# Patient Record
Sex: Female | Born: 1980
Health system: Southern US, Community
[De-identification: ages and names within clinical notes are randomized; demographics above are authoritative.]

## PROBLEM LIST (undated history)

## (undated) DIAGNOSIS — I10 Essential (primary) hypertension: Secondary | ICD-10-CM

## (undated) DIAGNOSIS — J45909 Unspecified asthma, uncomplicated: Secondary | ICD-10-CM

## (undated) DIAGNOSIS — Z21 Asymptomatic human immunodeficiency virus [HIV] infection status: Secondary | ICD-10-CM

## (undated) DIAGNOSIS — E119 Type 2 diabetes mellitus without complications: Secondary | ICD-10-CM

## (undated) DIAGNOSIS — I2510 Atherosclerotic heart disease of native coronary artery without angina pectoris: Secondary | ICD-10-CM

## (undated) DIAGNOSIS — J449 Chronic obstructive pulmonary disease, unspecified: Secondary | ICD-10-CM

## (undated) DIAGNOSIS — I509 Heart failure, unspecified: Secondary | ICD-10-CM

## (undated) DIAGNOSIS — J439 Emphysema, unspecified: Secondary | ICD-10-CM

## (undated) DIAGNOSIS — L0291 Cutaneous abscess, unspecified: Secondary | ICD-10-CM

## (undated) DIAGNOSIS — B20 Human immunodeficiency virus [HIV] disease: Secondary | ICD-10-CM

## (undated) DIAGNOSIS — J189 Pneumonia, unspecified organism: Secondary | ICD-10-CM

## (undated) DIAGNOSIS — I219 Acute myocardial infarction, unspecified: Secondary | ICD-10-CM

## (undated) HISTORY — PX: FOOT SURGERY: SHX648

## (undated) HISTORY — DX: Heart failure, unspecified: I50.9

## (undated) HISTORY — DX: Essential (primary) hypertension: I10

## (undated) HISTORY — PX: TUBAL LIGATION: SHX77

## (undated) HISTORY — DX: Atherosclerotic heart disease of native coronary artery without angina pectoris: I25.10

## (undated) HISTORY — PX: CHOLECYSTECTOMY: SHX55

---
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
## 2010-01-17 ENCOUNTER — Emergency Department (HOSPITAL_COMMUNITY): Admission: EM | Admit: 2010-01-17 | Discharge: 2010-01-17 | Payer: Self-pay | Admitting: Emergency Medicine

## 2010-07-15 ENCOUNTER — Emergency Department (HOSPITAL_COMMUNITY): Admission: AC | Admit: 2010-07-15 | Discharge: 2010-07-15 | Payer: Self-pay | Admitting: Emergency Medicine

## 2011-02-16 LAB — CBC
HCT: 37.7 % (ref 36.0–46.0)
Hemoglobin: 13.1 g/dL (ref 12.0–15.0)
Platelets: 139 10*3/uL — ABNORMAL LOW (ref 150–400)
RBC: 4.18 MIL/uL (ref 3.87–5.11)
RDW: 13.1 % (ref 11.5–15.5)
WBC: 5.4 10*3/uL (ref 4.0–10.5)

## 2011-02-16 LAB — DIFFERENTIAL
Basophils Relative: 0 % (ref 0–1)
Eosinophils Relative: 1 % (ref 0–5)
Monocytes Absolute: 0.4 10*3/uL (ref 0.1–1.0)
Monocytes Relative: 8 % (ref 3–12)
Neutro Abs: 2.8 10*3/uL (ref 1.7–7.7)

## 2011-02-16 LAB — POCT CARDIAC MARKERS
CKMB, poc: <1 ng/mL — ABNORMAL LOW (ref 1.0–8.0)
Troponin i, poc: <0.05 ng/mL (ref 0.00–0.09)

## 2011-04-17 ENCOUNTER — Emergency Department (HOSPITAL_COMMUNITY)
Admission: EM | Admit: 2011-04-17 | Discharge: 2011-04-17 | Disposition: A | Discharge status: Home / Self Care | Payer: Self-pay | Attending: Emergency Medicine | Admitting: Emergency Medicine

## 2011-04-17 DIAGNOSIS — J45909 Unspecified asthma, uncomplicated: Secondary | ICD-10-CM | POA: Insufficient documentation

## 2011-04-17 DIAGNOSIS — Z21 Asymptomatic human immunodeficiency virus [HIV] infection status: Secondary | ICD-10-CM | POA: Insufficient documentation

## 2011-04-17 DIAGNOSIS — N61 Mastitis without abscess: Secondary | ICD-10-CM | POA: Insufficient documentation

## 2011-04-29 ENCOUNTER — Emergency Department (HOSPITAL_COMMUNITY)
Admission: EM | Admit: 2011-04-29 | Discharge: 2011-04-29 | Discharge status: Against Medical Advice | Payer: PRIVATE HEALTH INSURANCE | Attending: Emergency Medicine | Admitting: Emergency Medicine

## 2011-04-29 ENCOUNTER — Emergency Department (HOSPITAL_COMMUNITY)
Admission: EM | Admit: 2011-04-29 | Discharge: 2011-04-30 | Disposition: A | Discharge status: Home / Self Care | Payer: PRIVATE HEALTH INSURANCE | Attending: Emergency Medicine | Admitting: Emergency Medicine

## 2011-04-29 DIAGNOSIS — N739 Female pelvic inflammatory disease, unspecified: Secondary | ICD-10-CM | POA: Insufficient documentation

## 2011-04-29 DIAGNOSIS — K644 Residual hemorrhoidal skin tags: Secondary | ICD-10-CM | POA: Insufficient documentation

## 2011-04-29 DIAGNOSIS — R109 Unspecified abdominal pain: Secondary | ICD-10-CM | POA: Insufficient documentation

## 2011-04-29 DIAGNOSIS — E119 Type 2 diabetes mellitus without complications: Secondary | ICD-10-CM | POA: Insufficient documentation

## 2011-04-29 DIAGNOSIS — Z21 Asymptomatic human immunodeficiency virus [HIV] infection status: Secondary | ICD-10-CM | POA: Insufficient documentation

## 2011-04-29 DIAGNOSIS — N898 Other specified noninflammatory disorders of vagina: Secondary | ICD-10-CM | POA: Insufficient documentation

## 2011-04-29 DIAGNOSIS — K645 Perianal venous thrombosis: Secondary | ICD-10-CM | POA: Insufficient documentation

## 2011-04-30 ENCOUNTER — Emergency Department (HOSPITAL_COMMUNITY): Payer: PRIVATE HEALTH INSURANCE

## 2011-04-30 LAB — URINALYSIS, ROUTINE W REFLEX MICROSCOPIC
Bilirubin Urine: NEGATIVE
Nitrite: NEGATIVE
Urobilinogen, UA: 0.2 mg/dL (ref 0.0–1.0)

## 2011-04-30 LAB — WET PREP, GENITAL
Trich, Wet Prep: NONE SEEN
Yeast Wet Prep HPF POC: NONE SEEN

## 2011-04-30 LAB — URINE MICROSCOPIC-ADD ON

## 2011-05-01 LAB — URINE CULTURE
Colony Count: NO GROWTH
Culture  Setup Time: 201206041408
Culture: NO GROWTH

## 2012-07-23 DIAGNOSIS — F39 Unspecified mood [affective] disorder: Secondary | ICD-10-CM | POA: Insufficient documentation

## 2014-06-18 DIAGNOSIS — K219 Gastro-esophageal reflux disease without esophagitis: Secondary | ICD-10-CM | POA: Insufficient documentation

## 2014-06-18 DIAGNOSIS — G629 Polyneuropathy, unspecified: Secondary | ICD-10-CM | POA: Insufficient documentation

## 2016-02-07 ENCOUNTER — Emergency Department (HOSPITAL_BASED_OUTPATIENT_CLINIC_OR_DEPARTMENT_OTHER)
Admission: EM | Admit: 2016-02-07 | Discharge: 2016-02-07 | Disposition: A | Discharge status: Home / Self Care | Payer: Commercial Managed Care - PPO | Attending: Emergency Medicine | Admitting: Emergency Medicine

## 2016-02-07 ENCOUNTER — Encounter (HOSPITAL_BASED_OUTPATIENT_CLINIC_OR_DEPARTMENT_OTHER): Payer: Self-pay | Admitting: *Deleted

## 2016-02-07 DIAGNOSIS — R69 Illness, unspecified: Secondary | ICD-10-CM

## 2016-02-07 DIAGNOSIS — J449 Chronic obstructive pulmonary disease, unspecified: Secondary | ICD-10-CM | POA: Insufficient documentation

## 2016-02-07 DIAGNOSIS — H66002 Acute suppurative otitis media without spontaneous rupture of ear drum, left ear: Secondary | ICD-10-CM | POA: Insufficient documentation

## 2016-02-07 DIAGNOSIS — Z794 Long term (current) use of insulin: Secondary | ICD-10-CM | POA: Diagnosis not present

## 2016-02-07 DIAGNOSIS — E109 Type 1 diabetes mellitus without complications: Secondary | ICD-10-CM | POA: Insufficient documentation

## 2016-02-07 DIAGNOSIS — R05 Cough: Secondary | ICD-10-CM | POA: Diagnosis present

## 2016-02-07 DIAGNOSIS — J111 Influenza due to unidentified influenza virus with other respiratory manifestations: Secondary | ICD-10-CM | POA: Insufficient documentation

## 2016-02-07 DIAGNOSIS — F17200 Nicotine dependence, unspecified, uncomplicated: Secondary | ICD-10-CM | POA: Insufficient documentation

## 2016-02-07 DIAGNOSIS — Z79899 Other long term (current) drug therapy: Secondary | ICD-10-CM | POA: Diagnosis not present

## 2016-02-07 HISTORY — DX: Type 2 diabetes mellitus without complications: E11.9

## 2016-02-07 HISTORY — DX: Chronic obstructive pulmonary disease, unspecified: J44.9

## 2016-02-07 MED ORDER — AZITHROMYCIN 250 MG PO TABS: ORAL_TABLET | ORAL | Status: DC

## 2016-02-07 MED FILL — AZITHROMYCIN 250 MG TABLET: 250 | 5 days supply | Qty: 6 | Fill #0

## 2016-02-07 NOTE — ED Notes (Signed)
Directed to pharmacy to pick up prescriptions 

## 2016-02-07 NOTE — ED Provider Notes (Signed)
CSN: [id number]     Arrival date & time 02/07/16  1017 History   First MD Initiated Contact with Patient 02/07/16 1033     Chief Complaint  Patient presents with  . Cough     (Consider location/radiation/quality/duration/timing/severity/associated sxs/prior Treatment) HPI Comments: Patient is a 35 year old female with history of type 1 diabetes. She presents for evaluation of fever, body aches, cough, congestion for the past several days. She started yesterday experiencing left ear pain and muffled hearing. She has a history of otitis media and states this feels the same.  Patient is a 35 y.o. female presenting with cough. The history is provided by the patient.  Cough Cough characteristics:  Non-productive Severity:  Moderate Onset quality:  Sudden Duration:  4 days Timing:  Constant Progression:  Worsening Chronicity:  New Smoker: yes   Relieved by:  Nothing Worsened by:  Nothing tried Ineffective treatments:  None tried Associated symptoms: fever   Associated symptoms: no chills     Past Medical History  Diagnosis Date  . Diabetes mellitus without complication ([place])   . COPD (chronic obstructive pulmonary disease) ([place])    History reviewed. No pertinent past surgical history. History reviewed. No pertinent family history. Social History  Substance Use Topics  . Smoking status: Current Some Day Smoker  . Smokeless tobacco: None  . Alcohol Use: None   OB History    No data available     Review of Systems  Constitutional: Positive for fever. Negative for chills.  Respiratory: Positive for cough.   All other systems reviewed and are negative.     Allergies  Wellbutrin  Home Medications   Prior to Admission medications   Medication Sig Start Date End Date Taking? Authorizing Provider  albuterol (PROVENTIL HFA;VENTOLIN HFA) 108 (90 Base) MCG/ACT inhaler Inhale into the lungs every 6 (six) hours as needed for wheezing or shortness of breath.   Yes Historical  Provider, MD  clonazePAM (KLONOPIN) 0.5 MG tablet Take 0.5 mg by mouth 2 (two) times daily.   Yes Historical Provider, MD  insulin glargine (LANTUS) 100 UNIT/ML injection Inject 35 Units into the skin daily.   Yes Historical Provider, MD  omeprazole (PRILOSEC) 20 MG capsule Take 20 mg by mouth daily.   Yes Historical Provider, MD   BP 122/87 mmHg  Pulse 96  Temp(Src) 98.1 F (36.7 C) (Oral)  Resp 16  Ht 5\' 7"  (1.702 m)  Wt 200 lb (90.719 kg)  BMI 31.32 kg/m2  SpO2 97%  LMP 01/31/2016 Physical Exam  Constitutional: She is oriented to person, place, and time. She appears well-developed and well-nourished. No distress.  HENT:  Head: Normocephalic and atraumatic.  Mouth/Throat: Oropharynx is clear and moist.  The right TM is clear.  The left TM is erythematous and bulging.  Neck: Normal range of motion. Neck supple.  Cardiovascular: Normal rate and regular rhythm.  Exam reveals no gallop and no friction rub.   No murmur heard. Pulmonary/Chest: Effort normal and breath sounds normal. No respiratory distress. She has no wheezes. She has no rales.  Abdominal: Soft. Bowel sounds are normal. She exhibits no distension. There is no tenderness.  Musculoskeletal: Normal range of motion.  Neurological: She is alert and oriented to person, place, and time.  Skin: Skin is warm and dry. She is not diaphoretic.  Nursing note and vitals reviewed.   ED Course  Procedures (including critical care time) Labs Review Labs Reviewed - No data to display  Imaging Review No results found. I  have personally reviewed and evaluated these images and lab results as part of my medical decision-making.   EKG Interpretation None      MDM   Final diagnoses:  None    The underlying URI is likely viral in nature, most likely influenza. However she does have signs and symptoms of an acute bacterial otitis media. This will be treated with Zithromax and when necessary return.    Veryl Speak,  MD 02/07/16 1043

## 2016-02-07 NOTE — Discharge Instructions (Signed)
Zithromax as prescribed.  Drink plenty of fluids and get plenty of rest.  Return to the emergency department if you experience chest pain or difficulty breathing.   Otitis Media With Effusion Otitis media with effusion is the presence of fluid in the middle ear. This is a common problem in children, which often follows ear infections. It may be present for weeks or longer after the infection. Unlike an acute ear infection, otitis media with effusion refers only to fluid behind the ear drum and not infection. Children with repeated ear and sinus infections and allergy problems are the most likely to get otitis media with effusion. CAUSES  The most frequent cause of the fluid buildup is dysfunction of the eustachian tubes. These are the tubes that drain fluid in the ears to the back of the nose (nasopharynx). SYMPTOMS   The main symptom of this condition is hearing loss. As a result, you or your child may:  Listen to the TV at a loud volume.  Not respond to questions.  Ask "what" often when spoken to.  Mistake or confuse one sound or word for another.  There may be a sensation of fullness or pressure but usually not pain. DIAGNOSIS   Your health care provider will diagnose this condition by examining you or your child's ears.  Your health care provider may test the pressure in you or your child's ear with a tympanometer.  A hearing test may be conducted if the problem persists. TREATMENT   Treatment depends on the duration and the effects of the effusion.  Antibiotics, decongestants, nose drops, and cortisone-type drugs (tablets or nasal spray) may not be helpful.  Children with persistent ear effusions may have delayed language or behavioral problems. Children at risk for developmental delays in hearing, learning, and speech may require referral to a specialist earlier than children not at risk.  You or your child's health care provider may suggest a referral to an ear, nose,  and throat surgeon for treatment. The following may help restore normal hearing:  Drainage of fluid.  Placement of ear tubes (tympanostomy tubes).  Removal of adenoids (adenoidectomy). HOME CARE INSTRUCTIONS   Avoid secondhand smoke.  Infants who are breastfed are less likely to have this condition.  Avoid feeding infants while they are lying flat.  Avoid known environmental allergens.  Avoid people who are sick. SEEK MEDICAL CARE IF:   Hearing is not better in 3 months.  Hearing is worse.  Ear pain.  Drainage from the ear.  Dizziness. MAKE SURE YOU:   Understand these instructions.  Will watch your condition.  Will get help right away if you are not doing well or get worse.   This information is not intended to replace advice given to you by your health care provider. Make sure you discuss any questions you have with your health care provider.   Document Released: 12/20/2004 Document Revised: 12/03/2014 Document Reviewed: 06/09/2013 Elsevier Interactive Patient Education 2016 Elsevier Inc.  Influenza, Adult Influenza ("the flu") is a viral infection of the respiratory tract. It occurs more often in winter months because people spend more time in close contact with one another. Influenza can make you feel very sick. Influenza easily spreads from person to person (contagious). CAUSES  Influenza is caused by a virus that infects the respiratory tract. You can catch the virus by breathing in droplets from an infected person's cough or sneeze. You can also catch the virus by touching something that was recently contaminated with the  virus and then touching your mouth, nose, or eyes. RISKS AND COMPLICATIONS You may be at risk for a more severe case of influenza if you smoke cigarettes, have diabetes, have chronic heart disease (such as heart failure) or lung disease (such as asthma), or if you have a weakened immune system. Elderly people and pregnant women are also at risk  for more serious infections. The most common problem of influenza is a lung infection (pneumonia). Sometimes, this problem can require emergency medical care and may be life threatening. SIGNS AND SYMPTOMS  Symptoms typically last 4 to 10 days and may include:  Fever.  Chills.  Headache, body aches, and muscle aches.  Sore throat.  Chest discomfort and cough.  Poor appetite.  Weakness or feeling tired.  Dizziness.  Nausea or vomiting. DIAGNOSIS  Diagnosis of influenza is often made based on your history and a physical exam. A nose or throat swab test can be done to confirm the diagnosis. TREATMENT  In mild cases, influenza goes away on its own. Treatment is directed at relieving symptoms. For more severe cases, your health care provider may prescribe antiviral medicines to shorten the sickness. Antibiotic medicines are not effective because the infection is caused by a virus, not by bacteria. HOME CARE INSTRUCTIONS  Take medicines only as directed by your health care provider.  Use a cool mist humidifier to make breathing easier.  Get plenty of rest until your temperature returns to normal. This usually takes 3 to 4 days.  Drink enough fluid to keep your urine clear or pale yellow.  Cover yourmouth and nosewhen coughing or sneezing,and wash your handswellto prevent thevirusfrom spreading.  Stay homefromwork orschool untilthe fever is gonefor at least 40full day. PREVENTION  An annual influenza vaccination (flu shot) is the best way to avoid getting influenza. An annual flu shot is now routinely recommended for all adults in the [place] IF:  You experiencechest pain, yourcough worsens,or you producemore mucus.  Youhave nausea,vomiting, ordiarrhea.  Your fever returns or gets worse. SEEK IMMEDIATE MEDICAL CARE IF:  You havetrouble breathing, you become short of breath,or your skin ornails becomebluish.  You have severe painor  stiffnessin the neck.  You develop a sudden headache, or pain in the face or ear.  You have nausea or vomiting that you cannot control. MAKE SURE YOU:   Understand these instructions.  Will watch your condition.  Will get help right away if you are not doing well or get worse.   This information is not intended to replace advice given to you by your health care provider. Make sure you discuss any questions you have with your health care provider.   Document Released: 11/09/2000 Document Revised: 12/03/2014 Document Reviewed: 02/11/2012 Elsevier Interactive Patient Education [employer].

## 2016-02-07 NOTE — ED Notes (Signed)
Pt amb to room 6 with quick steady gait in nad. Pt reports cough and congestion x Sunday, with diarrhea and vomiting yesterday, none today.

## 2016-02-09 ENCOUNTER — Emergency Department (HOSPITAL_BASED_OUTPATIENT_CLINIC_OR_DEPARTMENT_OTHER): Payer: Commercial Managed Care - PPO

## 2016-02-09 ENCOUNTER — Encounter (HOSPITAL_BASED_OUTPATIENT_CLINIC_OR_DEPARTMENT_OTHER): Payer: Self-pay | Admitting: Emergency Medicine

## 2016-02-09 ENCOUNTER — Emergency Department (HOSPITAL_BASED_OUTPATIENT_CLINIC_OR_DEPARTMENT_OTHER)
Admission: EM | Admit: 2016-02-09 | Discharge: 2016-02-09 | Disposition: A | Discharge status: Home / Self Care | Payer: Commercial Managed Care - PPO | Attending: Emergency Medicine | Admitting: Emergency Medicine

## 2016-02-09 DIAGNOSIS — Z79899 Other long term (current) drug therapy: Secondary | ICD-10-CM | POA: Insufficient documentation

## 2016-02-09 DIAGNOSIS — H6692 Otitis media, unspecified, left ear: Secondary | ICD-10-CM | POA: Diagnosis not present

## 2016-02-09 DIAGNOSIS — E119 Type 2 diabetes mellitus without complications: Secondary | ICD-10-CM | POA: Diagnosis not present

## 2016-02-09 DIAGNOSIS — Z791 Long term (current) use of non-steroidal anti-inflammatories (NSAID): Secondary | ICD-10-CM | POA: Diagnosis not present

## 2016-02-09 DIAGNOSIS — J449 Chronic obstructive pulmonary disease, unspecified: Secondary | ICD-10-CM | POA: Insufficient documentation

## 2016-02-09 DIAGNOSIS — F17210 Nicotine dependence, cigarettes, uncomplicated: Secondary | ICD-10-CM | POA: Diagnosis not present

## 2016-02-09 DIAGNOSIS — H9202 Otalgia, left ear: Secondary | ICD-10-CM | POA: Diagnosis present

## 2016-02-09 DIAGNOSIS — J069 Acute upper respiratory infection, unspecified: Secondary | ICD-10-CM

## 2016-02-09 DIAGNOSIS — Z794 Long term (current) use of insulin: Secondary | ICD-10-CM | POA: Insufficient documentation

## 2016-02-09 LAB — RAPID STREP SCREEN (MED CTR MEBANE ONLY): STREPTOCOCCUS, GROUP A SCREEN (DIRECT): NEGATIVE

## 2016-02-09 MED ORDER — AMOXICILLIN-POT CLAVULANATE 875-125 MG PO TABS: 1.0000 | ORAL_TABLET | Freq: Two times a day (BID) | ORAL | Status: DC

## 2016-02-09 MED FILL — AMOX-CLAV 875-125 MG TABLET: 875-125 | 10 days supply | Qty: 20 | Fill #0

## 2016-02-09 NOTE — Discharge Instructions (Signed)
1. Medications: augmentin, usual home medications 2. Treatment: rest, drink plenty of fluids  3. Follow Up: please followup with your primary doctor for discussion of your diagnoses and further evaluation after today's visit; if you do not have a primary care doctor use the phone number listed in your discharge paperwork to find one; please return to the ER for increased pain, severe chest pain or shortness of breath, new or worsening symptoms   Upper Respiratory Infection, Adult Most upper respiratory infections (URIs) are caused by a virus. A URI affects the nose, throat, and upper air passages. The most common type of URI is often called "the common cold." HOME CARE   Take medicines only as told by your doctor.  Gargle warm saltwater or take cough drops to comfort your throat as told by your doctor.  Use a warm mist humidifier or inhale steam from a shower to increase air moisture. This may make it easier to breathe.  Drink enough fluid to keep your pee (urine) clear or pale yellow.  Eat soups and other clear broths.  Have a healthy diet.  Rest as needed.  Go back to work when your fever is gone or your doctor says it is okay.  You may need to stay home longer to avoid giving your URI to others.  You can also wear a face mask and wash your hands often to prevent spread of the virus.  Use your inhaler more if you have asthma.  Do not use any tobacco products, including cigarettes, chewing tobacco, or electronic cigarettes. If you need help quitting, ask your doctor. GET HELP IF:  You are getting worse, not better.  Your symptoms are not helped by medicine.  You have chills.  You are getting more short of breath.  You have brown or red mucus.  You have yellow or brown discharge from your nose.  You have pain in your face, especially when you bend forward.  You have a fever.  You have puffy (swollen) neck glands.  You have pain while swallowing.  You have white  areas in the back of your throat. GET HELP RIGHT AWAY IF:   You have very bad or constant:  Headache.  Ear pain.  Pain in your forehead, behind your eyes, and over your cheekbones (sinus pain).  Chest pain.  You have long-lasting (chronic) lung disease and any of the following:  Wheezing.  Long-lasting cough.  Coughing up blood.  A change in your usual mucus.  You have a stiff neck.  You have changes in your:  Vision.  Hearing.  Thinking.  Mood. MAKE SURE YOU:   Understand these instructions.  Will watch your condition.  Will get help right away if you are not doing well or get worse.   This information is not intended to replace advice given to you by your health care provider. Make sure you discuss any questions you have with your health care provider.   Document Released: 04/30/2008 Document Revised: 03/29/2015 Document Reviewed: 02/17/2014 Elsevier Interactive Patient Education 2016 Elsevier Inc.  Otitis Media, Adult Otitis media is redness, soreness, and puffiness (swelling) in the space just behind your eardrum (middle ear). It may be caused by allergies or infection. It often happens along with a cold. HOME CARE  Take your medicine as told. Finish it even if you start to feel better.  Only take over-the-counter or prescription medicines for pain, discomfort, or fever as told by your doctor.  Follow up with your doctor  as told. GET HELP IF:  You have otitis media only in one ear, or bleeding from your nose, or both.  You notice a lump on your neck.  You are not getting better in 3-5 days.  You feel worse instead of better. GET HELP RIGHT AWAY IF:   You have pain that is not helped with medicine.  You have puffiness, redness, or pain around your ear.  You get a stiff neck.  You cannot move part of your face (paralysis).  You notice that the bone behind your ear hurts when you touch it. MAKE SURE YOU:   Understand these  instructions.  Will watch your condition.  Will get help right away if you are not doing well or get worse.   This information is not intended to replace advice given to you by your health care provider. Make sure you discuss any questions you have with your health care provider.   Document Released: 04/30/2008 Document Revised: 12/03/2014 Document Reviewed: 06/09/2013 Elsevier Interactive Patient Education [employer].

## 2016-02-09 NOTE — ED Provider Notes (Signed)
CSN: [id number]     Arrival date & time 02/09/16  1516 History   First MD Initiated Contact with Patient 02/09/16 1539     Chief Complaint  Patient presents with  . Otalgia    HPI   Renee Bryant is a 35 y.o. female with a PMH of DM, COPD who presents to the ED with subjective fever, chills, nasal congestion, sore throat, productive cough, and left ear pain. She states her symptoms started Saturday and have worsened since that time. Per record review, patient was evaluated in the ED 2 days ago and was discharged with azithromycin for her symptoms. She reports no improvement. She denies exacerbating factors. She reports chest discomfort with cough. She denies abdominal pain, N/V/D.   Past Medical History  Diagnosis Date  . Diabetes mellitus without complication ([place])   . COPD (chronic obstructive pulmonary disease) ([place])    History reviewed. No pertinent past surgical history. No family history on file. Social History  Substance Use Topics  . Smoking status: Current Some Day Smoker -- 1.00 packs/day    Types: Cigarettes  . Smokeless tobacco: None  . Alcohol Use: No   OB History    No data available      Review of Systems  Constitutional: Positive for fever and chills.  HENT: Positive for congestion, ear pain and sore throat.   Gastrointestinal: Negative for nausea, vomiting and diarrhea.  All other systems reviewed and are negative.     Allergies  Wellbutrin  Home Medications   Prior to Admission medications   Medication Sig Start Date End Date Taking? Authorizing Provider  azithromycin (ZITHROMAX Z-PAK) 250 MG tablet 2 po day one, then 1 daily x 4 days 02/07/16  Yes Veryl Speak, MD  albuterol (PROVENTIL HFA;VENTOLIN HFA) 108 (90 Base) MCG/ACT inhaler Inhale into the lungs every 6 (six) hours as needed for wheezing or shortness of breath.    Historical Provider, MD  amoxicillin-clavulanate (AUGMENTIN) 875-125 MG tablet Take 1 tablet by mouth every 12 (twelve) hours.  02/09/16   Marella Chimes, PA-C  clonazePAM (KLONOPIN) 0.5 MG tablet Take 0.5 mg by mouth 2 (two) times daily.    Historical Provider, MD  insulin glargine (LANTUS) 100 UNIT/ML injection Inject 35 Units into the skin daily.    Historical Provider, MD  omeprazole (PRILOSEC) 20 MG capsule Take 20 mg by mouth daily.    Historical Provider, MD    BP 146/95 mmHg  Pulse 93  Temp(Src) 98.3 F (36.8 C) (Oral)  Resp 18  Ht 5\' 7"  (1.702 m)  Wt 90.719 kg  BMI 31.32 kg/m2  SpO2 98%  LMP 01/31/2016 Physical Exam  Constitutional: She is oriented to person, place, and time. She appears well-developed and well-nourished. No distress.  HENT:  Head: Normocephalic and atraumatic.  Right Ear: Hearing, tympanic membrane, external ear and ear canal normal.  Left Ear: Hearing, external ear and ear canal normal. Tympanic membrane is erythematous.  Nose: Nose normal.  Mouth/Throat: Uvula is midline, oropharynx is clear and moist and mucous membranes are normal. No oropharyngeal exudate, posterior oropharyngeal edema, posterior oropharyngeal erythema or tonsillar abscesses.  Erythema to left TM. Mild tonsillar hypertrophy bilaterally.  Eyes: Conjunctivae, EOM and lids are normal. Pupils are equal, round, and reactive to light. Right eye exhibits no discharge. Left eye exhibits no discharge. No scleral icterus.  Neck: Normal range of motion. Neck supple.  Cardiovascular: Normal rate, regular rhythm, normal heart sounds, intact distal pulses and normal pulses.   Pulmonary/Chest: Effort  normal and breath sounds normal. No respiratory distress. She has no wheezes. She has no rales.  Abdominal: Soft. Normal appearance and bowel sounds are normal. She exhibits no distension and no mass. There is no tenderness. There is no rigidity, no rebound and no guarding.  Musculoskeletal: Normal range of motion. She exhibits no edema or tenderness.  Neurological: She is alert and oriented to person, place, and time.  Skin:  Skin is warm, dry and intact. No rash noted. She is not diaphoretic. No erythema. No pallor.  Psychiatric: She has a normal mood and affect. Her speech is normal and behavior is normal.  Nursing note and vitals reviewed.   ED Course  Procedures (including critical care time)  Labs Review Labs Reviewed  RAPID STREP SCREEN (NOT AT [facility])  CULTURE, GROUP A STREP [facility])    Imaging Review Dg Chest 2 View  02/09/2016  CLINICAL DATA:  Cough and fever 4 days EXAM: CHEST  2 VIEW COMPARISON:  Chest radiograph January 03, 2013; chest CT December 09, 2015 FINDINGS: There is no edema or consolidation. Heart size and pulmonary vascularity are normal. No adenopathy. No bone lesions. IMPRESSION: No edema or consolidation. Note that the nodular opacities seen on CT 2 months prior are not appreciable by radiography. Electronically Signed   By: Lowella Grip III M.D.   On: 02/09/2016 16:12   I have personally reviewed and evaluated these images and lab results as part of my medical decision-making.   EKG Interpretation None      MDM   Final diagnoses:  URI (upper respiratory infection)  Acute left otitis media, recurrence not specified, unspecified otitis media type    35 year old female presents with URI symptoms. Notes persistent left ear pain and cough, which she states she feels has worsened since being started on azithromycin. Patient is afebrile. Vital signs stable. No tachypnea, tachycardia, or hypoxia. Mild erythema to left TM. Mild tonsillar hypertrophy bilaterally. Lungs clear to auscultation bilaterally. Will obtain rapid strep and chest x-ray.   Rapid strep negative. Chest x-ray negative for edema or consolidation. Patient is nontoxic and well-appearing, feel she is stable for discharge at this time. Will change antibiotic to augmentin to cover for otitis media. Patient to follow up with PCP. Return precautions discussed. Patient verbalizes her understanding and is in agreement with  plan.  BP 146/95 mmHg  Pulse 93  Temp(Src) 98.3 F (36.8 C) (Oral)  Resp 18  Ht 5\' 7"  (1.702 m)  Wt 90.719 kg  BMI 31.32 kg/m2  SpO2 98%  LMP 01/31/2016     Marella Chimes, PA-C 02/09/16 [place], MD 02/09/16 [phone]

## 2016-02-09 NOTE — ED Notes (Signed)
Patient reports that she was here Tues and was dx with the flu. The patient reports that she has "never had anything take her down like this". The patient reports that she had started to have left ear pain now.

## 2016-02-09 NOTE — ED Notes (Signed)
Reports dx with flu and ear infection. States "I am getting worse and not better, increased ear pain and chest discomfort"

## 2016-02-11 LAB — CULTURE, GROUP A STREP (THRC)

## 2016-02-28 ENCOUNTER — Encounter (HOSPITAL_BASED_OUTPATIENT_CLINIC_OR_DEPARTMENT_OTHER): Payer: Self-pay | Admitting: Emergency Medicine

## 2016-02-28 ENCOUNTER — Emergency Department (HOSPITAL_BASED_OUTPATIENT_CLINIC_OR_DEPARTMENT_OTHER)
Admission: EM | Admit: 2016-02-28 | Discharge: 2016-02-28 | Disposition: A | Discharge status: Home / Self Care | Payer: Commercial Managed Care - PPO | Attending: Emergency Medicine | Admitting: Emergency Medicine

## 2016-02-28 DIAGNOSIS — F17210 Nicotine dependence, cigarettes, uncomplicated: Secondary | ICD-10-CM | POA: Diagnosis not present

## 2016-02-28 DIAGNOSIS — Z79899 Other long term (current) drug therapy: Secondary | ICD-10-CM | POA: Insufficient documentation

## 2016-02-28 DIAGNOSIS — N921 Excessive and frequent menstruation with irregular cycle: Secondary | ICD-10-CM | POA: Diagnosis not present

## 2016-02-28 DIAGNOSIS — R1030 Lower abdominal pain, unspecified: Secondary | ICD-10-CM | POA: Diagnosis present

## 2016-02-28 DIAGNOSIS — E119 Type 2 diabetes mellitus without complications: Secondary | ICD-10-CM | POA: Insufficient documentation

## 2016-02-28 DIAGNOSIS — J449 Chronic obstructive pulmonary disease, unspecified: Secondary | ICD-10-CM | POA: Insufficient documentation

## 2016-02-28 DIAGNOSIS — N946 Dysmenorrhea, unspecified: Secondary | ICD-10-CM | POA: Diagnosis not present

## 2016-02-28 DIAGNOSIS — Z3202 Encounter for pregnancy test, result negative: Secondary | ICD-10-CM | POA: Insufficient documentation

## 2016-02-28 DIAGNOSIS — Z794 Long term (current) use of insulin: Secondary | ICD-10-CM | POA: Diagnosis not present

## 2016-02-28 LAB — URINE MICROSCOPIC-ADD ON

## 2016-02-28 LAB — URINALYSIS, ROUTINE W REFLEX MICROSCOPIC
Ketones, ur: 15 mg/dL — AB
Leukocytes, UA: NEGATIVE
NITRITE: NEGATIVE
PROTEIN: 30 mg/dL — AB
Specific Gravity, Urine: >1.046 — ABNORMAL HIGH (ref 1.005–1.030)
pH: 6 (ref 5.0–8.0)

## 2016-02-28 LAB — CBG MONITORING, ED: Glucose-Capillary: 277 mg/dL — ABNORMAL HIGH (ref 65–99)

## 2016-02-28 LAB — WET PREP, GENITAL
SPERM: NONE SEEN
Trich, Wet Prep: NONE SEEN
Yeast Wet Prep HPF POC: NONE SEEN

## 2016-02-28 LAB — PREGNANCY, URINE: Preg Test, Ur: NEGATIVE

## 2016-02-28 NOTE — ED Notes (Signed)
Pt was asleep when entered room, resting well in nad, required physical touch to get her to wake up

## 2016-02-28 NOTE — ED Notes (Signed)
Patient reports that she was seen in last year and told that she had cysts on her ovaries. She was referred to GYN / onc for r/o cancer - she can not get an appointment till July. The patient reports that she had lower abdominal pain since yesterday.

## 2016-02-28 NOTE — ED Notes (Signed)
MD at bedside. 

## 2016-02-28 NOTE — ED Notes (Signed)
MD at bedside.  Pt sleeping with eyes closed, no acute distress noted.

## 2016-02-28 NOTE — Discharge Instructions (Signed)

## 2016-02-29 LAB — GC/CHLAMYDIA PROBE AMP (~~LOC~~) NOT AT ARMC
CHLAMYDIA, DNA PROBE: NEGATIVE
NEISSERIA GONORRHEA: NEGATIVE

## 2016-02-29 NOTE — ED Provider Notes (Signed)
CSN: [id number]     Arrival date & time 02/28/16  1308 History   First MD Initiated Contact with Patient 02/28/16 1329     Chief Complaint  Patient presents with  . Abdominal Pain     (Consider location/radiation/quality/duration/timing/severity/associated sxs/prior Treatment) Patient is a 35 y.o. female presenting with abdominal pain. The history is provided by the patient.  Abdominal Pain Pain location:  Suprapubic Pain quality: cramping   Pain radiates to:  Does not radiate Pain severity:  Moderate Onset quality:  Gradual Duration:  1 day Timing:  Constant Progression:  Unchanged Chronicity:  Recurrent Context comment:  Menstruating Relieved by:  Nothing Worsened by:  Nothing tried Ineffective treatments:  None tried Associated symptoms: vaginal bleeding   Associated symptoms: no diarrhea, no fever, no vaginal discharge and no vomiting   Risk factors: not pregnant     Past Medical History  Diagnosis Date  . Diabetes mellitus without complication ([place])   . COPD (chronic obstructive pulmonary disease) ([place])    History reviewed. No pertinent past surgical history. History reviewed. No pertinent family history. Social History  Substance Use Topics  . Smoking status: Current Some Day Smoker -- 1.00 packs/day    Types: Cigarettes  . Smokeless tobacco: None  . Alcohol Use: No   OB History    No data available     Review of Systems  Constitutional: Negative for fever.  Gastrointestinal: Positive for abdominal pain. Negative for vomiting and diarrhea.  Genitourinary: Positive for vaginal bleeding. Negative for vaginal discharge.  All other systems reviewed and are negative.     Allergies  Wellbutrin  Home Medications   Prior to Admission medications   Medication Sig Start Date End Date Taking? Authorizing Provider  albuterol (PROVENTIL HFA;VENTOLIN HFA) 108 (90 Base) MCG/ACT inhaler Inhale into the lungs every 6 (six) hours as needed for wheezing or shortness  of breath.    Historical Provider, MD  amoxicillin-clavulanate (AUGMENTIN) 875-125 MG tablet Take 1 tablet by mouth every 12 (twelve) hours. 02/09/16   Marella Chimes, PA-C  azithromycin (ZITHROMAX Z-PAK) 250 MG tablet 2 po day one, then 1 daily x 4 days 02/07/16   Veryl Speak, MD  clonazePAM (KLONOPIN) 0.5 MG tablet Take 0.5 mg by mouth 2 (two) times daily.    Historical Provider, MD  insulin glargine (LANTUS) 100 UNIT/ML injection Inject 35 Units into the skin daily.    Historical Provider, MD  omeprazole (PRILOSEC) 20 MG capsule Take 20 mg by mouth daily.    Historical Provider, MD   BP 120/65 mmHg  Pulse 89  Temp(Src) 97.9 F (36.6 C) (Oral)  Resp 18  Ht 5\' 7"  (1.702 m)  Wt 190 lb (86.183 kg)  BMI 29.75 kg/m2  SpO2 100%  LMP 01/29/2016 Physical Exam  Constitutional: She is oriented to person, place, and time. She appears well-developed and well-nourished. No distress.  HENT:  Head: Normocephalic.  Eyes: Conjunctivae are normal.  Neck: Neck supple. No tracheal deviation present.  Cardiovascular: Normal rate and regular rhythm.   Pulmonary/Chest: Effort normal. No respiratory distress.  Abdominal: Soft. She exhibits no distension. There is no tenderness. There is no rebound.  Genitourinary: Uterus is tender. Cervix exhibits no motion tenderness, no discharge and no friability. Right adnexum displays no tenderness. Left adnexum displays no tenderness. There is bleeding in the vagina.  Neurological: She is alert and oriented to person, place, and time.  Skin: Skin is warm and dry.  Psychiatric: She has a normal mood and affect.  Vitals reviewed.   ED Course  Procedures (including critical care time) Labs Review Labs Reviewed  WET PREP, GENITAL - Abnormal; Notable for the following:    Clue Cells Wet Prep HPF POC PRESENT (*)    WBC, Wet Prep HPF POC MODERATE (*)    All other components within normal limits  URINALYSIS, ROUTINE W REFLEX MICROSCOPIC (NOT AT [facility]) -  Abnormal; Notable for the following:    Color, Urine AMBER (*)    Specific Gravity, Urine >1.046 (*)    Glucose, UA >1000 (*)    Hgb urine dipstick MODERATE (*)    Bilirubin Urine SMALL (*)    Ketones, ur 15 (*)    Protein, ur 30 (*)    All other components within normal limits  URINE MICROSCOPIC-ADD ON - Abnormal; Notable for the following:    Squamous Epithelial / LPF 0-5 (*)    Bacteria, UA MANY (*)    All other components within normal limits  CBG MONITORING, ED - Abnormal; Notable for the following:    Glucose-Capillary 277 (*)    All other components within normal limits  PREGNANCY, URINE  GC/CHLAMYDIA PROBE AMP ([place]) NOT AT [facility]    Imaging Review No results found. I have personally reviewed and evaluated these images and lab results as part of my medical decision-making.   EKG Interpretation None      MDM   Final diagnoses:  Menometrorrhagia  Dysmenorrhea    35 y.o. female presents with pelvic pain and ongoing spotting. upreg negative, otherwise well appearing. Pelvic unremarkable except scant dark blood from cervical os. She was told she needed colposcopy after a PAP but was unable to obtain f/u until July for this. She wishes to discuss her DUB with a gynecologist. Will refer her to women's center to establish care with gynecology. No further emergent workup indicated as Pt has no clinical signs of anemia and no signs of infection.     Leo Grosser, MD 02/29/16 [phone]

## 2016-03-03 ENCOUNTER — Encounter (HOSPITAL_BASED_OUTPATIENT_CLINIC_OR_DEPARTMENT_OTHER): Payer: Self-pay | Admitting: *Deleted

## 2016-03-03 ENCOUNTER — Emergency Department (HOSPITAL_BASED_OUTPATIENT_CLINIC_OR_DEPARTMENT_OTHER)
Admission: EM | Admit: 2016-03-03 | Discharge: 2016-03-03 | Disposition: A | Discharge status: Home / Self Care | Payer: Commercial Managed Care - PPO | Attending: Emergency Medicine | Admitting: Emergency Medicine

## 2016-03-03 DIAGNOSIS — J449 Chronic obstructive pulmonary disease, unspecified: Secondary | ICD-10-CM | POA: Diagnosis not present

## 2016-03-03 DIAGNOSIS — E119 Type 2 diabetes mellitus without complications: Secondary | ICD-10-CM | POA: Diagnosis not present

## 2016-03-03 DIAGNOSIS — Z79899 Other long term (current) drug therapy: Secondary | ICD-10-CM | POA: Insufficient documentation

## 2016-03-03 DIAGNOSIS — F17210 Nicotine dependence, cigarettes, uncomplicated: Secondary | ICD-10-CM | POA: Diagnosis not present

## 2016-03-03 DIAGNOSIS — N611 Abscess of the breast and nipple: Secondary | ICD-10-CM | POA: Insufficient documentation

## 2016-03-03 DIAGNOSIS — Z21 Asymptomatic human immunodeficiency virus [HIV] infection status: Secondary | ICD-10-CM | POA: Diagnosis not present

## 2016-03-03 DIAGNOSIS — Z794 Long term (current) use of insulin: Secondary | ICD-10-CM | POA: Diagnosis not present

## 2016-03-03 HISTORY — DX: Cutaneous abscess, unspecified: L02.91

## 2016-03-03 MED ORDER — SULFAMETHOXAZOLE-TRIMETHOPRIM 800-160 MG PO TABS: 1.0000 | ORAL_TABLET | Freq: Two times a day (BID) | ORAL | Status: AC

## 2016-03-03 NOTE — ED Notes (Signed)
Pt c/o abcess on left breat, near nipple line for 4 days, increasing swelling and pain over course of past 4 days.  Pt h/o abcesses but states they normally drain on their own.  This one has not.

## 2016-03-03 NOTE — ED Notes (Signed)
Patient c/o abscess on inside of left breast that has grown worse over the past two days

## 2016-03-03 NOTE — Discharge Instructions (Signed)
Follow-up with your infectious disease doctor or the breast clinic of [facility]. Use warm compresses to your breast. Return to ED with new, worsening or concerning symptoms.   Abscess An abscess is an infected area that contains a collection of pus and debris.It can occur in almost any part of the body. An abscess is also known as a furuncle or boil. CAUSES  An abscess occurs when tissue gets infected. This can occur from blockage of oil or sweat glands, infection of hair follicles, or a minor injury to the skin. As the body tries to fight the infection, pus collects in the area and creates pressure under the skin. This pressure causes pain. People with weakened immune systems have difficulty fighting infections and get certain abscesses more often.  SYMPTOMS Usually an abscess develops on the skin and becomes a painful mass that is red, warm, and tender. If the abscess forms under the skin, you may feel a moveable soft area under the skin. Some abscesses break open (rupture) on their own, but most will continue to get worse without care. The infection can spread deeper into the body and eventually into the bloodstream, causing you to feel ill.  DIAGNOSIS  Your caregiver will take your medical history and perform a physical exam. A sample of fluid may also be taken from the abscess to determine what is causing your infection. TREATMENT  Your caregiver may prescribe antibiotic medicines to fight the infection. However, taking antibiotics alone usually does not cure an abscess. Your caregiver may need to make a small cut (incision) in the abscess to drain the pus. In some cases, gauze is packed into the abscess to reduce pain and to continue draining the area. HOME CARE INSTRUCTIONS   Only take over-the-counter or prescription medicines for pain, discomfort, or fever as directed by your caregiver.  If you were prescribed antibiotics, take them as directed. Finish them even if you start to feel  better.  If gauze is used, follow your caregiver's directions for changing the gauze.  To avoid spreading the infection:  Keep your draining abscess covered with a bandage.  Wash your hands well.  Do not share personal care items, towels, or whirlpools with others.  Avoid skin contact with others.  Keep your skin and clothes clean around the abscess.  Keep all follow-up appointments as directed by your caregiver. SEEK MEDICAL CARE IF:   You have increased pain, swelling, redness, fluid drainage, or bleeding.  You have muscle aches, chills, or a general ill feeling.  You have a fever. MAKE SURE YOU:   Understand these instructions.  Will watch your condition.  Will get help right away if you are not doing well or get worse.   This information is not intended to replace advice given to you by your health care provider. Make sure you discuss any questions you have with your health care provider.   Document Released: 08/22/2005 Document Revised: 05/13/2012 Document Reviewed: 01/25/2012 Elsevier Interactive Patient Education [employer].

## 2016-03-03 NOTE — ED Provider Notes (Signed)
CSN: [id number]     Arrival date & time 03/03/16  1130 History   First MD Initiated Contact with Patient 03/03/16 1144     Chief Complaint  Patient presents with  . Abscess   Patient is a 35 y.o. female presenting with abscess.  Abscess Location:  Torso Torso abscess location:  L chest Abscess quality: induration, painful and redness   Abscess quality: not draining, no fluctuance and no warmth   Red streaking: no   Duration:  2 days Progression:  Worsening Relieved by:  None tried Ineffective treatments:  None tried Associated symptoms: no fever, no nausea and no vomiting   Risk factors: prior abscess    Renee Bryant is a 35 year old female with a past medical history of diabetes, HIV and recurrent breast abscesses presenting with breast abscess. Patient reports multiple abscesses in the right breast that have required surgical drainage and and drain placement. She states that occasionally the abscesses drain on their own but this one has not yet. This abscess is located in the 9:00 position next to the nipple. She complains of increasing pain and redness over the past 2 days. Denies systemic symptoms including fever, chills, nausea or vomiting. She has not tried any home remedies for this.  Chart review: Patient followed by infectious disease at [place] for HIV. She has been seen multiple times for breast abscesses with hospitalizations and general surgery referrals.   Past Medical History  Diagnosis Date  . Diabetes mellitus without complication ([place])   . COPD (chronic obstructive pulmonary disease) ([place])   . Abscess    Past Surgical History  Procedure Laterality Date  . Cesarean section    . Cholecystectomy    . Tubal ligation     No family history on file. Social History  Substance Use Topics  . Smoking status: Current Some Day Smoker -- 1.00 packs/day    Types: Cigarettes  . Smokeless tobacco: None  . Alcohol Use: Yes   OB History    No data available     Review  of Systems  Constitutional: Negative for fever and chills.  Gastrointestinal: Negative for nausea and vomiting.  Skin: Positive for color change.       Abscess  All other systems reviewed and are negative.     Allergies  Wellbutrin  Home Medications   Prior to Admission medications   Medication Sig Start Date End Date Taking? Authorizing Provider  insulin NPH-regular Human (NOVOLIN 70/30) (70-30) 100 UNIT/ML injection Inject 30 Units into the skin 2 (two) times daily with a meal.   Yes Historical Provider, MD  albuterol (PROVENTIL HFA;VENTOLIN HFA) 108 (90 Base) MCG/ACT inhaler Inhale into the lungs every 6 (six) hours as needed for wheezing or shortness of breath.    Historical Provider, MD  amoxicillin-clavulanate (AUGMENTIN) 875-125 MG tablet Take 1 tablet by mouth every 12 (twelve) hours. 02/09/16   Marella Chimes, PA-C  azithromycin (ZITHROMAX Z-PAK) 250 MG tablet 2 po day one, then 1 daily x 4 days 02/07/16   Veryl Speak, MD  clonazePAM (KLONOPIN) 0.5 MG tablet Take 0.5 mg by mouth 2 (two) times daily.    Historical Provider, MD  insulin glargine (LANTUS) 100 UNIT/ML injection Inject 35 Units into the skin daily.    Historical Provider, MD  omeprazole (PRILOSEC) 20 MG capsule Take 20 mg by mouth daily.    Historical Provider, MD  sulfamethoxazole-trimethoprim (BACTRIM DS,SEPTRA DS) 800-160 MG tablet Take 1 tablet by mouth 2 (two) times daily. 03/03/16  03/10/16  Christiona Siddique, PA-C   BP 129/88 mmHg  Pulse 80  Temp(Src) 98.2 F (36.8 C) (Oral)  Resp 16  Ht 5\' 7"  (1.702 m)  Wt 86.637 kg  BMI 29.91 kg/m2  SpO2 100%  LMP 01/29/2016 Physical Exam  Constitutional: She appears well-developed and well-nourished. No distress.  Nontoxic-appearing  HENT:  Head: Normocephalic and atraumatic.  Right Ear: External ear normal.  Left Ear: External ear normal.  Eyes: Conjunctivae are normal. Right eye exhibits no discharge. Left eye exhibits no discharge. No scleral icterus.  Neck:  Normal range of motion.  Cardiovascular: Normal rate.   Pulmonary/Chest: Effort normal.    Erythema and induration next to the left nipple in the 9 o'clock position. No streaking. No appreciable fluctuance. Induration extends into the nipple. No nipple discharge. Two draining abscesses noted in the left breast without overlying erythema or tenderness.  Musculoskeletal: Normal range of motion.  Moves all extremities spontaneously  Neurological: She is alert. Coordination normal.  Skin: Skin is warm and dry.  Psychiatric: She has a normal mood and affect. Her behavior is normal.  Nursing note and vitals reviewed.   ED Course  Procedures (including critical care time) Labs Review Labs Reviewed - No data to display  Imaging Review No results found. I have personally reviewed and evaluated these images and lab results as part of my medical decision-making.   EKG Interpretation None      MDM   Final diagnoses:  Breast abscess   35 year old female with HIV presenting with breast abscess x 2 days. Hx of similar that are followed by her infectious disease provider and occasionally general surgery. Afebrile and nontoxic appearing. Area of induration and erythema noted over the left breast. No definite area of fluctuance. Induration extends into the nipple. Abscess is not amenable to I&D given proximty to nipple and lack of fluctuance. Will prescribe bactrim and have patient follow up with ID or breast clinic on Monday. Pt is afebrile without tachycardia and appears in no acute distress. Patient seen by Dr. Tamera Punt who agrees with this plan. Return precautions given in discharge paperwork and discussed with pt at bedside. Pt stable for discharge     Josephina Gip, PA-C 03/03/16 [place], MD 03/03/16 1427

## 2016-03-13 ENCOUNTER — Encounter (HOSPITAL_BASED_OUTPATIENT_CLINIC_OR_DEPARTMENT_OTHER): Payer: Self-pay

## 2016-03-13 ENCOUNTER — Emergency Department (HOSPITAL_BASED_OUTPATIENT_CLINIC_OR_DEPARTMENT_OTHER)
Admission: EM | Admit: 2016-03-13 | Discharge: 2016-03-13 | Disposition: A | Discharge status: Home / Self Care | Payer: Commercial Managed Care - PPO | Attending: Emergency Medicine | Admitting: Emergency Medicine

## 2016-03-13 DIAGNOSIS — Z79899 Other long term (current) drug therapy: Secondary | ICD-10-CM | POA: Diagnosis not present

## 2016-03-13 DIAGNOSIS — Z794 Long term (current) use of insulin: Secondary | ICD-10-CM | POA: Diagnosis not present

## 2016-03-13 DIAGNOSIS — Z872 Personal history of diseases of the skin and subcutaneous tissue: Secondary | ICD-10-CM | POA: Diagnosis not present

## 2016-03-13 DIAGNOSIS — J449 Chronic obstructive pulmonary disease, unspecified: Secondary | ICD-10-CM | POA: Insufficient documentation

## 2016-03-13 DIAGNOSIS — F17210 Nicotine dependence, cigarettes, uncomplicated: Secondary | ICD-10-CM | POA: Insufficient documentation

## 2016-03-13 DIAGNOSIS — Z3202 Encounter for pregnancy test, result negative: Secondary | ICD-10-CM | POA: Diagnosis not present

## 2016-03-13 DIAGNOSIS — E1165 Type 2 diabetes mellitus with hyperglycemia: Secondary | ICD-10-CM | POA: Diagnosis not present

## 2016-03-13 DIAGNOSIS — R739 Hyperglycemia, unspecified: Secondary | ICD-10-CM

## 2016-03-13 LAB — BASIC METABOLIC PANEL
Anion gap: 6 (ref 5–15)
BUN: 14 mg/dL (ref 6–20)
CALCIUM: 9 mg/dL (ref 8.9–10.3)
CHLORIDE: 102 mmol/L (ref 101–111)
CO2: 24 mmol/L (ref 22–32)
CREATININE: 0.63 mg/dL (ref 0.44–1.00)
GFR calc Af Amer: >60 mL/min (ref >60)
GFR calc non Af Amer: >60 mL/min (ref >60)
Glucose, Bld: 359 mg/dL — ABNORMAL HIGH (ref 65–99)
Potassium: 4 mmol/L (ref 3.5–5.1)
Sodium: 132 mmol/L — ABNORMAL LOW (ref 135–145)

## 2016-03-13 LAB — CBC WITH DIFFERENTIAL/PLATELET
BASOS PCT: 0 %
Basophils Absolute: 0 10*3/uL (ref 0.0–0.1)
EOS ABS: 0.1 10*3/uL (ref 0.0–0.7)
Eosinophils Relative: 1 %
HEMATOCRIT: 41.9 % (ref 36.0–46.0)
HEMOGLOBIN: 15 g/dL (ref 12.0–15.0)
LYMPHS ABS: 3.3 10*3/uL (ref 0.7–4.0)
Lymphocytes Relative: 42 %
MCH: 30.1 pg (ref 26.0–34.0)
MCHC: 35.8 g/dL (ref 30.0–36.0)
MCV: 84.1 fL (ref 78.0–100.0)
MONO ABS: 0.6 10*3/uL (ref 0.1–1.0)
MONOS PCT: 8 %
NEUTROS ABS: 3.8 10*3/uL (ref 1.7–7.7)
NEUTROS PCT: 49 %
Platelets: 186 10*3/uL (ref 150–400)
RBC: 4.98 MIL/uL (ref 3.87–5.11)
RDW: 12.4 % (ref 11.5–15.5)
WBC: 7.8 10*3/uL (ref 4.0–10.5)

## 2016-03-13 LAB — URINALYSIS, ROUTINE W REFLEX MICROSCOPIC
BILIRUBIN URINE: NEGATIVE
Ketones, ur: NEGATIVE mg/dL
Leukocytes, UA: NEGATIVE
Nitrite: NEGATIVE
PH: 5.5 (ref 5.0–8.0)
Protein, ur: 100 mg/dL — AB
SPECIFIC GRAVITY, URINE: 1.041 — AB (ref 1.005–1.030)

## 2016-03-13 LAB — URINE MICROSCOPIC-ADD ON

## 2016-03-13 LAB — CBG MONITORING, ED
GLUCOSE-CAPILLARY: 281 mg/dL — AB (ref 65–99)
Glucose-Capillary: 381 mg/dL — ABNORMAL HIGH (ref 65–99)

## 2016-03-13 LAB — I-STAT VENOUS BLOOD GAS, ED
ACID-BASE EXCESS: 1 mmol/L (ref 0.0–2.0)
BICARBONATE: 26.2 meq/L — AB (ref 20.0–24.0)
O2 SAT: 65 %
TCO2: 27 mmol/L (ref 0–100)
pCO2, Ven: 41.7 mmHg — ABNORMAL LOW (ref 45.0–50.0)
pH, Ven: 7.406 — ABNORMAL HIGH (ref 7.250–7.300)
pO2, Ven: 34 mmHg (ref 31.0–45.0)

## 2016-03-13 LAB — PREGNANCY, URINE: Preg Test, Ur: NEGATIVE

## 2016-03-13 MED ORDER — SODIUM CHLORIDE 0.9 % IV BOLUS (SEPSIS)
1000.0000 mL | Freq: Once | INTRAVENOUS | Status: AC
  Administered 2016-03-13: 1000 mL via INTRAVENOUS

## 2016-03-13 NOTE — ED Notes (Signed)
Pa  at bedside. 

## 2016-03-13 NOTE — ED Notes (Signed)
C/o elevated BS x 1 year-changed from metformin to insulin approx 4 months ago-felt near syncopal today-BS 517 at 315pm-NAD-steady gait-pt drove self to ED

## 2016-03-13 NOTE — ED Provider Notes (Signed)
CSN: [id number]     Arrival date & time 03/13/16  1609 History   First MD Initiated Contact with Patient 03/13/16 1655     Chief Complaint  Patient presents with  . Hyperglycemia     (Consider location/radiation/quality/duration/timing/severity/associated sxs/prior Treatment) Patient is a 35 y.o. female presenting with hyperglycemia. The history is provided by the patient and medical records.  Hyperglycemia  35 year old female with history of COPD and diabetes, presenting to the ED for hyperglycemia. Patient reports over the past year she has had difficulty controlling her blood sugar. She is seen by the resident physicians at [facility]. She states she was initially on high doses of metformin and glipizide, however was switched to insulin several months ago. She states she was initially on Lantus, but was recently changed in novolin due to changes in insurance and cost.  She states her blood sugar steadily [place] in the 200s. She states rarely she will have a sugar into the 400s. She states earlier this afternoon she began feeling lightheaded and nauseated. She states that her blood sugar and it was 517. She does not recall having blood sugar that time. She has been hospitalized at [facility] for her diabetes in the past. Patient denies any current chest pain or shortness of breath. No abdominal pain. Denies any significant polyuria or polydipsia. No urinary symptoms.  VSS.  Past Medical History  Diagnosis Date  . Diabetes mellitus without complication ([place])   . COPD (chronic obstructive pulmonary disease) ([place])   . Abscess    Past Surgical History  Procedure Laterality Date  . Cesarean section    . Cholecystectomy    . Tubal ligation     No family history on file. Social History  Substance Use Topics  . Smoking status: Current Some Day Smoker -- 1.00 packs/day    Types: Cigarettes  . Smokeless tobacco: None  . Alcohol Use: No   OB History    No data  available     Review of Systems  Endocrine:       Hyperglycemia  All other systems reviewed and are negative.     Allergies  Wellbutrin  Home Medications   Prior to Admission medications   Medication Sig Start Date End Date Taking? Authorizing Provider  albuterol (PROVENTIL HFA;VENTOLIN HFA) 108 (90 Base) MCG/ACT inhaler Inhale into the lungs every 6 (six) hours as needed for wheezing or shortness of breath.    Historical Provider, MD  clonazePAM (KLONOPIN) 0.5 MG tablet Take 0.5 mg by mouth 2 (two) times daily.    Historical Provider, MD  insulin NPH-regular Human (NOVOLIN 70/30) (70-30) 100 UNIT/ML injection Inject 30 Units into the skin 2 (two) times daily with a meal.    Historical Provider, MD  omeprazole (PRILOSEC) 20 MG capsule Take 20 mg by mouth daily.    Historical Provider, MD   BP 133/99 mmHg  Pulse 94  Temp(Src) 98 F (36.7 C) (Oral)  Resp 18  Ht 5\' 7"  (1.702 m)  Wt 84.823 kg  BMI 29.28 kg/m2  SpO2 99%  LMP 03/12/2016   Physical Exam  Constitutional: She is oriented to person, place, and time. She appears well-developed and well-nourished. No distress.  HENT:  Head: Normocephalic and atraumatic.  Mouth/Throat: Oropharynx is clear and moist.  Mildly dry mucous membranes  Eyes: Conjunctivae and EOM are normal. Pupils are equal, round, and reactive to light.  Neck: Normal range of motion. Neck supple.  Cardiovascular: Normal rate, regular rhythm and  normal heart sounds.   Pulmonary/Chest: Effort normal and breath sounds normal. No respiratory distress. She has no wheezes.  Abdominal: Soft. Bowel sounds are normal. There is no tenderness. There is no guarding.  Musculoskeletal: Normal range of motion.  Neurological: She is alert and oriented to person, place, and time.  Skin: Skin is warm and dry. She is not diaphoretic.  Psychiatric: She has a normal mood and affect.  Nursing note and vitals reviewed.   ED Course  Procedures (including critical care  time) Labs Review Labs Reviewed  BASIC METABOLIC PANEL - Abnormal; Notable for the following:    Sodium 132 (*)    Glucose, Bld 359 (*)    All other components within normal limits  URINALYSIS, ROUTINE W REFLEX MICROSCOPIC (NOT AT [facility]) - Abnormal; Notable for the following:    APPearance CLOUDY (*)    Specific Gravity, Urine 1.041 (*)    Glucose, UA >1000 (*)    Hgb urine dipstick LARGE (*)    Protein, ur 100 (*)    All other components within normal limits  URINE MICROSCOPIC-ADD ON - Abnormal; Notable for the following:    Squamous Epithelial / LPF 0-5 (*)    Bacteria, UA MANY (*)    All other components within normal limits  CBG MONITORING, ED - Abnormal; Notable for the following:    Glucose-Capillary 381 (*)    All other components within normal limits  I-STAT VENOUS BLOOD GAS, ED - Abnormal; Notable for the following:    pH, Ven 7.406 (*)    pCO2, Ven 41.7 (*)    Bicarbonate 26.2 (*)    All other components within normal limits  CBG MONITORING, ED - Abnormal; Notable for the following:    Glucose-Capillary 281 (*)    All other components within normal limits  CBC WITH DIFFERENTIAL/PLATELET  PREGNANCY, URINE    Imaging Review No results found. I have personally reviewed and evaluated these images and lab results as part of my medical decision-making.   EKG Interpretation None      MDM   Final diagnoses:  Hyperglycemia   35 year old female with hyperglycemia. This is been ongoing for the past year. Was switched from oral medications to insulin in the past 6 months, recently changed from Lantus and Novolin. Reports blood sugars steadily run in the 250 range.  Patient is afebrile, nontoxic. She is in no acute distress. Labs as above, no evidence of DKA with normal VBG, anion gap, and bicarb.  No ketones noted in urine. U/a with bacteria noted, patient asymptomatic of this.  Will hold treatment for now pending urine culture.  With IV fluids alone, patient is blood  sugar is now 281.  This is close to her baseline.  Patient reports she feels better after IV fluids. Patient has follow-up with her PCP tomorrow, recommended to discuss insulin regimen with PCP for better control.  Discussed plan with patient, he/she acknowledged understanding and agreed with plan of care.  Return precautions given for new or worsening symptoms.  Larene Pickett, PA-C 03/13/16 2003  Fredia Sorrow, MD 03/15/16 [phone]

## 2016-03-13 NOTE — Discharge Instructions (Signed)
Recommended to discuss your insulin regimen with your primary care physician tomorrow a appointment. Your lab work today is attached on back of paperwork for physician review. Return here for any new concerns.

## 2016-03-13 NOTE — ED Notes (Signed)
pa at bedside. 

## 2016-06-23 ENCOUNTER — Emergency Department (HOSPITAL_BASED_OUTPATIENT_CLINIC_OR_DEPARTMENT_OTHER)
Admission: EM | Admit: 2016-06-23 | Discharge: 2016-06-23 | Disposition: A | Discharge status: Home / Self Care | Payer: Commercial Managed Care - PPO | Attending: Emergency Medicine | Admitting: Emergency Medicine

## 2016-06-23 ENCOUNTER — Encounter (HOSPITAL_BASED_OUTPATIENT_CLINIC_OR_DEPARTMENT_OTHER): Payer: Self-pay | Admitting: *Deleted

## 2016-06-23 DIAGNOSIS — Z794 Long term (current) use of insulin: Secondary | ICD-10-CM | POA: Diagnosis not present

## 2016-06-23 DIAGNOSIS — E119 Type 2 diabetes mellitus without complications: Secondary | ICD-10-CM | POA: Insufficient documentation

## 2016-06-23 DIAGNOSIS — F17210 Nicotine dependence, cigarettes, uncomplicated: Secondary | ICD-10-CM | POA: Insufficient documentation

## 2016-06-23 DIAGNOSIS — K529 Noninfective gastroenteritis and colitis, unspecified: Secondary | ICD-10-CM

## 2016-06-23 DIAGNOSIS — J449 Chronic obstructive pulmonary disease, unspecified: Secondary | ICD-10-CM | POA: Insufficient documentation

## 2016-06-23 DIAGNOSIS — R112 Nausea with vomiting, unspecified: Secondary | ICD-10-CM | POA: Diagnosis present

## 2016-06-23 LAB — CBC WITH DIFFERENTIAL/PLATELET
BASOS ABS: 0 10*3/uL (ref 0.0–0.1)
BASOS PCT: 0 %
EOS ABS: 0.1 10*3/uL (ref 0.0–0.7)
Eosinophils Relative: 1 %
HEMATOCRIT: 41 % (ref 36.0–46.0)
HEMOGLOBIN: 14.6 g/dL (ref 12.0–15.0)
Lymphocytes Relative: 39 %
Lymphs Abs: 2 10*3/uL (ref 0.7–4.0)
MCH: 29.9 pg (ref 26.0–34.0)
MCHC: 35.6 g/dL (ref 30.0–36.0)
MCV: 84 fL (ref 78.0–100.0)
Monocytes Absolute: 0.7 10*3/uL (ref 0.1–1.0)
Monocytes Relative: 14 %
NEUTROS ABS: 2.3 10*3/uL (ref 1.7–7.7)
NEUTROS PCT: 46 %
PLATELETS: 130 10*3/uL — AB (ref 150–400)
RBC: 4.88 MIL/uL (ref 3.87–5.11)
RDW: 12.3 % (ref 11.5–15.5)
WBC: 5 10*3/uL (ref 4.0–10.5)

## 2016-06-23 LAB — PREGNANCY, URINE: PREG TEST UR: NEGATIVE

## 2016-06-23 LAB — URINE MICROSCOPIC-ADD ON: WBC, UA: NONE SEEN WBC/hpf (ref 0–5)

## 2016-06-23 LAB — URINALYSIS, ROUTINE W REFLEX MICROSCOPIC
BILIRUBIN URINE: NEGATIVE
Glucose, UA: >1000 mg/dL — AB
Hgb urine dipstick: NEGATIVE
KETONES UR: NEGATIVE mg/dL
Leukocytes, UA: NEGATIVE
NITRITE: NEGATIVE
PROTEIN: NEGATIVE mg/dL
Specific Gravity, Urine: 1.038 — ABNORMAL HIGH (ref 1.005–1.030)
pH: 5.5 (ref 5.0–8.0)

## 2016-06-23 LAB — BASIC METABOLIC PANEL
ANION GAP: 8 (ref 5–15)
BUN: 11 mg/dL (ref 6–20)
CALCIUM: 8.8 mg/dL — AB (ref 8.9–10.3)
CO2: 21 mmol/L — AB (ref 22–32)
CREATININE: 0.57 mg/dL (ref 0.44–1.00)
Chloride: 99 mmol/L — ABNORMAL LOW (ref 101–111)
Glucose, Bld: 515 mg/dL (ref 65–99)
Potassium: 3.9 mmol/L (ref 3.5–5.1)
SODIUM: 128 mmol/L — AB (ref 135–145)

## 2016-06-23 LAB — CBG MONITORING, ED: Glucose-Capillary: 410 mg/dL — ABNORMAL HIGH (ref 65–99)

## 2016-06-23 MED ORDER — SODIUM CHLORIDE 0.9 % IV BOLUS (SEPSIS)
1000.0000 mL | Freq: Once | INTRAVENOUS | Status: AC
  Administered 2016-06-23: 1000 mL via INTRAVENOUS

## 2016-06-23 MED ORDER — ONDANSETRON HCL 4 MG/2ML IJ SOLN
4.0000 mg | Freq: Once | INTRAMUSCULAR | Status: AC
  Administered 2016-06-23: 4 mg via INTRAVENOUS
  Filled 2016-06-23: qty 2

## 2016-06-23 MED ORDER — INSULIN REGULAR HUMAN 100 UNIT/ML IJ SOLN
5.0000 [IU] | Freq: Once | INTRAMUSCULAR | Status: AC
  Administered 2016-06-23: 5 [IU] via SUBCUTANEOUS
  Filled 2016-06-23: qty 1

## 2016-06-23 NOTE — ED Notes (Signed)
Report to Myishia Kasik, RN.

## 2016-06-23 NOTE — ED Provider Notes (Signed)
[facility] DEPT [facility] Provider Note   CSN: [id number] Arrival date & time: 06/23/16  1029  First Provider Contact:  First MD Initiated Contact with Patient 06/23/16 1044        History   Chief Complaint Chief Complaint  Patient presents with  . Abdominal Pain  . Nausea    HPI Renee Bryant is a 35 y.o. female.  PT with a hx of DM presents with n/v/d which started 2 days ago.  She states it started with diarrhea and the diarrhea has continued. She has multiple episodes of watery, nonbloody diarrhea per day. She's had some associated occasional nausea and vomiting. She denies abdominal pain other than she's had some ongoing discomfort in her pelvic area for which she has a follow-up appointment 2 days from now. She denies any change in that. No vaginal bleeding or discharge currently. No urinary symptoms. No known fevers. She states 3 of her family members have similar symptoms she denies any recent antibiotic use. No recent travel outside the country or camping.    Abdominal Pain   Associated symptoms include diarrhea, nausea and vomiting.    Past Medical History:  Diagnosis Date  . Abscess   . COPD (chronic obstructive pulmonary disease) ([place])   . Diabetes mellitus without complication ([place])     There are no active problems to display for this patient.   Past Surgical History:  Procedure Laterality Date  . CESAREAN SECTION    . CHOLECYSTECTOMY    . TUBAL LIGATION      OB History    No data available       Home Medications    Prior to Admission medications   Medication Sig Start Date End Date Taking? Authorizing Provider  albuterol (PROVENTIL HFA;VENTOLIN HFA) 108 (90 Base) MCG/ACT inhaler Inhale into the lungs every 6 (six) hours as needed for wheezing or shortness of breath.    Historical Provider, MD  clonazePAM (KLONOPIN) 0.5 MG tablet Take 0.5 mg by mouth 2 (two) times daily.    Historical Provider, MD  insulin NPH-regular Human (NOVOLIN 70/30)  (70-30) 100 UNIT/ML injection Inject 30 Units into the skin 2 (two) times daily with a meal.    Historical Provider, MD  omeprazole (PRILOSEC) 20 MG capsule Take 20 mg by mouth daily.    Historical Provider, MD    Family History History reviewed. No pertinent family history.  Social History Social History  Substance Use Topics  . Smoking status: Current Some Day Smoker    Packs/day: 1.00    Types: Cigarettes  . Smokeless tobacco: Never Used  . Alcohol use Yes     Allergies   Wellbutrin [bupropion]   Review of Systems Review of Systems  Gastrointestinal: Positive for abdominal pain, diarrhea, nausea and vomiting.     Physical Exam Updated Vital Signs BP 138/88 (BP Location: Left Arm)   Pulse 88   Temp 98.1 F (36.7 C) (Oral)   Resp 18   Ht 5\' 7"  (1.702 m)   Wt 187 lb (84.8 kg)   SpO2 100%   BMI 29.29 kg/m   Physical Exam  Constitutional: She is oriented to person, place, and time. She appears well-developed and well-nourished.  HENT:  Head: Normocephalic and atraumatic.  Eyes: Pupils are equal, round, and reactive to light.  Neck: Normal range of motion. Neck supple.  Cardiovascular: Normal rate, regular rhythm and normal heart sounds.   Pulmonary/Chest: Effort normal and breath sounds normal. No respiratory distress. She has no wheezes. She  has no rales. She exhibits no tenderness.  Abdominal: Soft. Bowel sounds are normal. There is no tenderness. There is no rebound and no guarding.  Musculoskeletal: Normal range of motion. She exhibits no edema.  Lymphadenopathy:    She has no cervical adenopathy.  Neurological: She is alert and oriented to person, place, and time.  Skin: Skin is warm and dry. No rash noted.  Psychiatric: She has a normal mood and affect.     ED Treatments / Results  Labs (all labs ordered are listed, but only abnormal results are displayed) Results for orders placed or performed during the hospital encounter of [id number]  Basic  metabolic panel  Result Value Ref Range   Sodium 128 (L) 135 - 145 mmol/L   Potassium 3.9 3.5 - 5.1 mmol/L   Chloride 99 (L) 101 - 111 mmol/L   CO2 21 (L) 22 - 32 mmol/L   Glucose, Bld 515 (HH) 65 - 99 mg/dL   BUN 11 6 - 20 mg/dL   Creatinine, Ser 0.57 0.44 - 1.00 mg/dL   Calcium 8.8 (L) 8.9 - 10.3 mg/dL   GFR calc non Af Amer >60 >60 mL/min   GFR calc Af Amer >60 >60 mL/min   Anion gap 8 5 - 15  CBC with Differential  Result Value Ref Range   WBC 5.0 4.0 - 10.5 K/uL   RBC 4.88 3.87 - 5.11 MIL/uL   Hemoglobin 14.6 12.0 - 15.0 g/dL   HCT 41.0 36.0 - 46.0 %   MCV 84.0 78.0 - 100.0 fL   MCH 29.9 26.0 - 34.0 pg   MCHC 35.6 30.0 - 36.0 g/dL   RDW 12.3 11.5 - 15.5 %   Platelets 130 (L) 150 - 400 K/uL   Neutrophils Relative % 46 %   Neutro Abs 2.3 1.7 - 7.7 K/uL   Lymphocytes Relative 39 %   Lymphs Abs 2.0 0.7 - 4.0 K/uL   Monocytes Relative 14 %   Monocytes Absolute 0.7 0.1 - 1.0 K/uL   Eosinophils Relative 1 %   Eosinophils Absolute 0.1 0.0 - 0.7 K/uL   Basophils Relative 0 %   Basophils Absolute 0.0 0.0 - 0.1 K/uL  Urinalysis, Routine w reflex microscopic  Result Value Ref Range   Color, Urine YELLOW YELLOW   APPearance CLEAR CLEAR   Specific Gravity, Urine 1.038 (H) 1.005 - 1.030   pH 5.5 5.0 - 8.0   Glucose, UA >1000 (A) NEGATIVE mg/dL   Hgb urine dipstick NEGATIVE NEGATIVE   Bilirubin Urine NEGATIVE NEGATIVE   Ketones, ur NEGATIVE NEGATIVE mg/dL   Protein, ur NEGATIVE NEGATIVE mg/dL   Nitrite NEGATIVE NEGATIVE   Leukocytes, UA NEGATIVE NEGATIVE  Pregnancy, urine  Result Value Ref Range   Preg Test, Ur NEGATIVE NEGATIVE  Urine microscopic-add on  Result Value Ref Range   Squamous Epithelial / LPF 0-5 (A) NONE SEEN   WBC, UA NONE SEEN 0 - 5 WBC/hpf   RBC / HPF 0-5 0 - 5 RBC/hpf   Bacteria, UA RARE (A) NONE SEEN  CBG monitoring, ED  Result Value Ref Range   Glucose-Capillary 410 (H) 65 - 99 mg/dL   No results found.   EKG  EKG Interpretation None        Radiology No results found.  Procedures Procedures (including critical care time)  Medications Ordered in ED Medications  insulin regular (NOVOLIN R,HUMULIN R) 100 units/mL injection 5 Units (not administered)  sodium chloride 0.9 % bolus 1,000 mL (1,000 mLs Intravenous  New Bag/Given 06/23/16 1108)  ondansetron (ZOFRAN) injection 4 mg (4 mg Intravenous Given 06/23/16 1108)     Initial Impression / Assessment and Plan / ED Course  I have reviewed the triage vital signs and the nursing notes.  Pertinent labs & imaging results that were available during my care of the patient were reviewed by me and considered in my medical decision making (see chart for details).  Clinical Course    Patient presents with vomiting and diarrhea. She's had no vomiting today. This sounds viral in nature. She has no abdominal pain on exam. She's afebrile. Her labs are unremarkable other than hyperglycemia and pseudohyponatremia. There is no evidence of DKA. Patient is drinking a large [employer] in the exam room. She was given IV fluids and her sugar came down to 410. She was given a shot of regular insulin as well. I advised her against drinking the sugary drinks and other foods that are not advised with her diabetes. She was advised to use a clear liquid diet for the next 24 hours and follow-up with her PCP who is associated with [facility] if her symptoms are not improving. Return precautions were given.  Final Clinical Impressions(s) / ED Diagnoses   Final diagnoses:  Gastroenteritis    New Prescriptions New Prescriptions   No medications on file     Malvin Johns, MD 06/23/16 1254

## 2016-06-23 NOTE — ED Notes (Signed)
Pt given ice water.

## 2016-06-23 NOTE — ED Triage Notes (Signed)
patient c/o abd pain, vomiting & some diarrhea since last Thursday. No medications taken. Able to drink fluids

## 2016-07-16 DIAGNOSIS — R87612 Low grade squamous intraepithelial lesion on cytologic smear of cervix (LGSIL): Secondary | ICD-10-CM | POA: Insufficient documentation

## 2016-10-25 ENCOUNTER — Emergency Department (HOSPITAL_BASED_OUTPATIENT_CLINIC_OR_DEPARTMENT_OTHER): Payer: Self-pay

## 2016-10-25 ENCOUNTER — Emergency Department (HOSPITAL_BASED_OUTPATIENT_CLINIC_OR_DEPARTMENT_OTHER)
Admission: EM | Admit: 2016-10-25 | Discharge: 2016-10-25 | Disposition: A | Discharge status: Home / Self Care | Payer: Self-pay | Attending: Physician Assistant | Admitting: Physician Assistant

## 2016-10-25 ENCOUNTER — Encounter (HOSPITAL_BASED_OUTPATIENT_CLINIC_OR_DEPARTMENT_OTHER): Payer: Self-pay | Admitting: *Deleted

## 2016-10-25 DIAGNOSIS — J40 Bronchitis, not specified as acute or chronic: Secondary | ICD-10-CM | POA: Insufficient documentation

## 2016-10-25 DIAGNOSIS — J449 Chronic obstructive pulmonary disease, unspecified: Secondary | ICD-10-CM | POA: Insufficient documentation

## 2016-10-25 DIAGNOSIS — E119 Type 2 diabetes mellitus without complications: Secondary | ICD-10-CM | POA: Insufficient documentation

## 2016-10-25 DIAGNOSIS — Z794 Long term (current) use of insulin: Secondary | ICD-10-CM | POA: Insufficient documentation

## 2016-10-25 DIAGNOSIS — R6889 Other general symptoms and signs: Secondary | ICD-10-CM

## 2016-10-25 DIAGNOSIS — F17210 Nicotine dependence, cigarettes, uncomplicated: Secondary | ICD-10-CM | POA: Insufficient documentation

## 2016-10-25 DIAGNOSIS — J029 Acute pharyngitis, unspecified: Secondary | ICD-10-CM

## 2016-10-25 LAB — CBC WITH DIFFERENTIAL/PLATELET
BASOS ABS: 0 10*3/uL (ref 0.0–0.1)
BASOS PCT: 0 %
EOS PCT: 4 %
Eosinophils Absolute: 0.2 10*3/uL (ref 0.0–0.7)
HEMATOCRIT: 40 % (ref 36.0–46.0)
Hemoglobin: 14.3 g/dL (ref 12.0–15.0)
LYMPHS PCT: 26 %
Lymphs Abs: 1.7 10*3/uL (ref 0.7–4.0)
MCH: 30.3 pg (ref 26.0–34.0)
MCHC: 35.8 g/dL (ref 30.0–36.0)
MCV: 84.7 fL (ref 78.0–100.0)
Monocytes Absolute: 0.6 10*3/uL (ref 0.1–1.0)
Monocytes Relative: 10 %
NEUTROS ABS: 4 10*3/uL (ref 1.7–7.7)
Neutrophils Relative %: 60 %
PLATELETS: 142 10*3/uL — AB (ref 150–400)
RBC: 4.72 MIL/uL (ref 3.87–5.11)
RDW: 12.4 % (ref 11.5–15.5)
WBC: 6.6 10*3/uL (ref 4.0–10.5)

## 2016-10-25 LAB — BASIC METABOLIC PANEL
ANION GAP: 6 (ref 5–15)
BUN: 11 mg/dL (ref 6–20)
CALCIUM: 8.6 mg/dL — AB (ref 8.9–10.3)
CO2: 25 mmol/L (ref 22–32)
Chloride: 100 mmol/L — ABNORMAL LOW (ref 101–111)
Creatinine, Ser: 0.43 mg/dL — ABNORMAL LOW (ref 0.44–1.00)
GLUCOSE: 357 mg/dL — AB (ref 65–99)
POTASSIUM: 4 mmol/L (ref 3.5–5.1)
Sodium: 131 mmol/L — ABNORMAL LOW (ref 135–145)

## 2016-10-25 LAB — URINALYSIS, ROUTINE W REFLEX MICROSCOPIC
Bilirubin Urine: NEGATIVE
KETONES UR: NEGATIVE mg/dL
LEUKOCYTES UA: NEGATIVE
NITRITE: NEGATIVE
PROTEIN: 30 mg/dL — AB
Specific Gravity, Urine: 1.043 — ABNORMAL HIGH (ref 1.005–1.030)
pH: 6 (ref 5.0–8.0)

## 2016-10-25 LAB — RAPID STREP SCREEN (MED CTR MEBANE ONLY): STREPTOCOCCUS, GROUP A SCREEN (DIRECT): NEGATIVE

## 2016-10-25 LAB — URINE MICROSCOPIC-ADD ON

## 2016-10-25 LAB — CBG MONITORING, ED: GLUCOSE-CAPILLARY: 358 mg/dL — AB (ref 65–99)

## 2016-10-25 MED ORDER — INSULIN NPH ISOPHANE & REGULAR (70-30) 100 UNIT/ML ~~LOC~~ SUSP: 30.0000 [IU] | Freq: Two times a day (BID) | SUBCUTANEOUS | 0 refills | Status: DC

## 2016-10-25 MED ORDER — AZITHROMYCIN 250 MG PO TABS: 250.0000 mg | ORAL_TABLET | Freq: Every day | ORAL | 0 refills | Status: DC

## 2016-10-25 NOTE — ED Provider Notes (Signed)
[facility] DEPT [facility] Provider Note   CSN: [id number] Arrival date & time: 10/25/16  0946     History   Chief Complaint Chief Complaint  Patient presents with  . Sore Throat    HPI Renee Bryant is a 35 y.o. female.  Patient with history of diabetes, and COPD presents to the emergency department with chief complaint of sinus congestion, cough, and sore throat 2 weeks. She also states that she recently started having some diarrhea. She has tried taking OTC medications with no relief. She states that she has been out of her insulin. She denies any chest pain, shortness of breath, congestion, or abdominal pain.   The history is provided by the patient. No language interpreter was used.    Past Medical History:  Diagnosis Date  . Abscess   . COPD (chronic obstructive pulmonary disease) ([place])   . Diabetes mellitus without complication ([place])     There are no active problems to display for this patient.   Past Surgical History:  Procedure Laterality Date  . CESAREAN SECTION    . CHOLECYSTECTOMY    . TUBAL LIGATION      OB History    No data available       Home Medications    Prior to Admission medications   Medication Sig Start Date End Date Taking? Authorizing Provider  albuterol (PROVENTIL HFA;VENTOLIN HFA) 108 (90 Base) MCG/ACT inhaler Inhale into the lungs every 6 (six) hours as needed for wheezing or shortness of breath.    Historical Provider, MD  clonazePAM (KLONOPIN) 0.5 MG tablet Take 0.5 mg by mouth 2 (two) times daily.    Historical Provider, MD  insulin NPH-regular Human (NOVOLIN 70/30) (70-30) 100 UNIT/ML injection Inject 30 Units into the skin 2 (two) times daily with a meal.    Historical Provider, MD  omeprazole (PRILOSEC) 20 MG capsule Take 20 mg by mouth daily.    Historical Provider, MD    Family History History reviewed. No pertinent family history.  Social History Social History  Substance Use Topics  . Smoking status: Current Some  Day Smoker    Packs/day: 0.50    Types: Cigarettes  . Smokeless tobacco: Never Used  . Alcohol use Yes     Allergies   Wellbutrin [bupropion]   Review of Systems Review of Systems  All other systems reviewed and are negative.    Physical Exam Updated Vital Signs BP (!) 137/101 (BP Location: Left Arm)   Pulse 80   Temp 98.1 F (36.7 C) (Oral)   Resp 16   Ht 5\' 7"  (1.702 m)   Wt 81.6 kg   LMP 10/25/2016   SpO2 97%   BMI 28.19 kg/m   Physical Exam  Constitutional: She is oriented to person, place, and time. She appears well-developed and well-nourished.  HENT:  Head: Normocephalic and atraumatic.  Oropharynx mildly erythematous, no tonsillar exudates, no abscess, moderate sinus pressure  Eyes: Conjunctivae and EOM are normal. Pupils are equal, round, and reactive to light.  Neck: Normal range of motion. Neck supple.  Cardiovascular: Normal rate and regular rhythm.  Exam reveals no gallop and no friction rub.   No murmur heard. Pulmonary/Chest: Effort normal and breath sounds normal. No respiratory distress. She has no wheezes. She has no rales. She exhibits no tenderness.  CTAB  Abdominal: Soft. Bowel sounds are normal. She exhibits no distension and no mass. There is no tenderness. There is no rebound and no guarding.  No focal abdominal tenderness, no  RLQ tenderness or pain at McBurney's point, no RUQ tenderness or Murphy's sign, no left-sided abdominal tenderness, no fluid wave, or signs of peritonitis   Musculoskeletal: Normal range of motion. She exhibits no edema or tenderness.  Neurological: She is alert and oriented to person, place, and time.  Skin: Skin is warm and dry.  Psychiatric: She has a normal mood and affect. Her behavior is normal. Judgment and thought content normal.  Nursing note and vitals reviewed.    ED Treatments / Results  Labs (all labs ordered are listed, but only abnormal results are displayed) Labs Reviewed  CBC WITH  DIFFERENTIAL/PLATELET - Abnormal; Notable for the following:       Result Value   Platelets 142 (*)    All other components within normal limits  BASIC METABOLIC PANEL - Abnormal; Notable for the following:    Sodium 131 (*)    Chloride 100 (*)    Glucose, Bld 357 (*)    Creatinine, Ser 0.43 (*)    Calcium 8.6 (*)    All other components within normal limits  URINALYSIS, ROUTINE W REFLEX MICROSCOPIC (NOT AT [facility]) - Abnormal; Notable for the following:    Specific Gravity, Urine 1.043 (*)    Glucose, UA >1000 (*)    Hgb urine dipstick LARGE (*)    Protein, ur 30 (*)    All other components within normal limits  URINE MICROSCOPIC-ADD ON - Abnormal; Notable for the following:    Squamous Epithelial / LPF 0-5 (*)    Bacteria, UA RARE (*)    All other components within normal limits  CBG MONITORING, ED - Abnormal; Notable for the following:    Glucose-Capillary 358 (*)    All other components within normal limits  RAPID STREP SCREEN (NOT AT [facility])  CULTURE, GROUP A STREP [facility])    EKG  EKG Interpretation None       Radiology Dg Chest 2 View  Result Date: 10/25/2016 CLINICAL DATA:  Sore throat.  Cough. EXAM: CHEST  2 VIEW COMPARISON:  02/09/2016.  CT 12/09/2015. FINDINGS: Mediastinum and hilar structures are normal. Heart size normal. Mild peribronchial cuffing noted suggesting bronchitis . No focal alveolar infiltrate. No pleural effusion or pneumothorax. IMPRESSION: Mild peribronchial cuffing noted suggesting bronchitis. Exam is otherwise unremarkable . Electronically Signed   By: Marcello Moores  Register   On: 10/25/2016 11:15    Procedures Procedures (including critical care time)  Medications Ordered in ED Medications - No data to display   Initial Impression / Assessment and Plan / ED Course  I have reviewed the triage vital signs and the nursing notes.  Pertinent labs & imaging results that were available during my care of the patient were reviewed by me and considered in  my medical decision making (see chart for details).  Clinical Course     Patient with uncontrolled diabetes, out of her medications, presents with flulike symptoms. She did not get a flu shot. Will check labs given hyperglycemia.  Laboratory workup is reassuring. Anion gap is 6. Patient has no ketones in her urine. I have refilled her insulin, and instructed to take this as directed. Rapid strep test negative, chest x-ray remarkable for bronchitic changes. Will discharge to home with supportive care.  Patient may benefit from a z-pak given length of symptoms. Final Clinical Impressions(s) / ED Diagnoses   Final diagnoses:  Bronchitis  Sore throat  Flu-like symptoms    New Prescriptions New Prescriptions   AZITHROMYCIN (ZITHROMAX) 250 MG TABLET  Take 1 tablet (250 mg total) by mouth daily. Take first 2 tablets together, then 1 every day until finished.     Montine Circle, PA-C 10/25/16 [place], MD 10/25/16 1458

## 2016-10-25 NOTE — ED Triage Notes (Signed)
Pt c/o sore throat x 2 weeks also c/o diarrhea

## 2016-10-27 LAB — CULTURE, GROUP A STREP (THRC)

## 2016-12-05 ENCOUNTER — Emergency Department (HOSPITAL_BASED_OUTPATIENT_CLINIC_OR_DEPARTMENT_OTHER)
Admission: EM | Admit: 2016-12-05 | Discharge: 2016-12-05 | Disposition: A | Discharge status: Home / Self Care | Payer: Commercial Managed Care - PPO | Attending: Emergency Medicine | Admitting: Emergency Medicine

## 2016-12-05 ENCOUNTER — Encounter (HOSPITAL_BASED_OUTPATIENT_CLINIC_OR_DEPARTMENT_OTHER): Payer: Self-pay | Admitting: *Deleted

## 2016-12-05 DIAGNOSIS — J069 Acute upper respiratory infection, unspecified: Secondary | ICD-10-CM | POA: Insufficient documentation

## 2016-12-05 DIAGNOSIS — Z794 Long term (current) use of insulin: Secondary | ICD-10-CM | POA: Insufficient documentation

## 2016-12-05 DIAGNOSIS — R21 Rash and other nonspecific skin eruption: Secondary | ICD-10-CM | POA: Insufficient documentation

## 2016-12-05 DIAGNOSIS — F17210 Nicotine dependence, cigarettes, uncomplicated: Secondary | ICD-10-CM | POA: Insufficient documentation

## 2016-12-05 DIAGNOSIS — J449 Chronic obstructive pulmonary disease, unspecified: Secondary | ICD-10-CM | POA: Insufficient documentation

## 2016-12-05 DIAGNOSIS — E119 Type 2 diabetes mellitus without complications: Secondary | ICD-10-CM | POA: Insufficient documentation

## 2016-12-05 LAB — RAPID STREP SCREEN (MED CTR MEBANE ONLY): STREPTOCOCCUS, GROUP A SCREEN (DIRECT): NEGATIVE

## 2016-12-05 MED ORDER — NAPROXEN 500 MG PO TABS: 500.0000 mg | ORAL_TABLET | Freq: Two times a day (BID) | ORAL | 0 refills | Status: DC

## 2016-12-05 MED ORDER — PSEUDOEPHEDRINE HCL 60 MG PO TABS: 60.0000 mg | ORAL_TABLET | ORAL | 0 refills | Status: DC | PRN

## 2016-12-05 NOTE — ED Triage Notes (Signed)
Pt c/o sore throat and left ear pain x 2 days

## 2016-12-05 NOTE — Discharge Instructions (Signed)
Please read and follow all provided instructions.  Your diagnoses today include:  1. Upper respiratory tract infection, unspecified type     You appear to have an upper respiratory infection (URI). An upper respiratory tract infection, or cold, is a viral infection of the air passages leading to the lungs. It should improve gradually after 5-7 days. You may have a lingering cough that lasts for 2- 4 weeks after the infection.  Tests performed today include:  Vital signs. See below for your results today.   Strep test - negative  Medications prescribed:   Pseudoephedrine - decongestant medication to help with nasal congestion   Naproxen - anti-inflammatory pain medication  Do not exceed 500mg  naproxen every 12 hours, take with food  You have been prescribed an anti-inflammatory medication or NSAID. Take with food. Take smallest effective dose for the shortest duration needed for your pain. Stop taking if you experience stomach pain or vomiting.   Take any prescribed medications only as directed. Treatment for your infection is aimed at treating the symptoms. There are no medications, such as antibiotics, that will cure your infection.   Home care instructions:  Follow any educational materials contained in this packet.   Your illness is contagious and can be spread to others, especially during the first 3 or 4 days. It cannot be cured by antibiotics or other medicines. Take basic precautions such as washing your hands often, covering your mouth when you cough or sneeze, and avoiding public places where you could spread your illness to others.   Please continue drinking plenty of fluids.  Use over-the-counter medicines as needed as directed on packaging for symptom relief.  You may also use ibuprofen or tylenol as directed on packaging for pain or fever.  Do not take multiple medicines containing Tylenol or acetaminophen to avoid taking too much of this medication.  Follow-up  instructions: Please follow-up with your primary care provider in the next 3 days for further evaluation of your symptoms if you are not feeling better.   Return instructions:   Please return to the Emergency Department if you experience worsening symptoms.   RETURN IMMEDIATELY IF you develop shortness of breath, confusion or altered mental status, a new rash, become dizzy, faint, or poorly responsive, or are unable to be cared for at home.  Please return if you have persistent vomiting and cannot keep down fluids or develop a fever that is not controlled by tylenol or motrin.    Please return if you have any other emergent concerns.  Additional Information:  Your vital signs today were: BP 143/93    Pulse 97    Temp 98.2 F (36.8 C)    Resp 18    Ht 5\' 7"  (1.702 m)    Wt 81.6 kg    LMP 11/26/2016    SpO2 98%    BMI 28.19 kg/m  If your blood pressure (BP) was elevated above 135/85 this visit, please have this repeated by your doctor within one month. --------------

## 2016-12-05 NOTE — ED Provider Notes (Signed)
[place] DEPT [facility] Provider Note   CSN: [id number] Arrival date & time: 12/05/16  1230     History   Chief Complaint Chief Complaint  Patient presents with  . Sore Throat    HPI Renee Bryant is a 36 y.o. female.  Patient presents with complaint of left ear pain, sore throat, nasal congestion, cough for the past 2 days. No associated fevers, vomiting, or diarrhea. Patient has been around a friend who is sick with similar symptoms. She states that she has a history of strep throat and was concerned that she is developing this. No treatments prior to arrival. Patient has bug bites noted to both arms and states that she recently had her home treated for bedbugs. The onset of this condition was acute. The course is constant. Aggravating factors: none. Alleviating factors: none.        Past Medical History:  Diagnosis Date  . Abscess   . COPD (chronic obstructive pulmonary disease) ([place])   . Diabetes mellitus without complication ([place])     There are no active problems to display for this patient.   Past Surgical History:  Procedure Laterality Date  . CESAREAN SECTION    . CHOLECYSTECTOMY    . TUBAL LIGATION      OB History    No data available       Home Medications    Prior to Admission medications   Medication Sig Start Date End Date Taking? Authorizing Provider  albuterol (PROVENTIL HFA;VENTOLIN HFA) 108 (90 Base) MCG/ACT inhaler Inhale into the lungs every 6 (six) hours as needed for wheezing or shortness of breath.    Historical Provider, MD  clonazePAM (KLONOPIN) 0.5 MG tablet Take 0.5 mg by mouth 2 (two) times daily.    Historical Provider, MD  insulin NPH-regular Human (NOVOLIN 70/30) (70-30) 100 UNIT/ML injection Inject 30 Units into the skin 2 (two) times daily with a meal. 10/25/16   Montine Circle, PA-C  omeprazole (PRILOSEC) 20 MG capsule Take 20 mg by mouth daily.    Historical Provider, MD    Family History History reviewed. No pertinent  family history.  Social History Social History  Substance Use Topics  . Smoking status: Current Some Day Smoker    Packs/day: 1.00    Types: Cigarettes  . Smokeless tobacco: Never Used  . Alcohol use Yes     Allergies   Wellbutrin [bupropion]   Review of Systems Review of Systems  Constitutional: Positive for fatigue. Negative for chills and fever.  HENT: Positive for congestion, ear pain and sore throat. Negative for rhinorrhea and sinus pressure.   Eyes: Negative for redness.  Respiratory: Positive for cough. Negative for wheezing.   Gastrointestinal: Negative for abdominal pain, diarrhea, nausea and vomiting.  Genitourinary: Negative for dysuria.  Musculoskeletal: Negative for myalgias and neck stiffness.  Skin: Positive for rash.  Neurological: Negative for headaches.  Hematological: Negative for adenopathy.     Physical Exam Updated Vital Signs BP 143/93   Pulse 97   Temp 98.2 F (36.8 C)   Resp 18   Ht 5\' 7"  (1.702 m)   Wt 81.6 kg   LMP 11/26/2016   SpO2 98%   BMI 28.19 kg/m   Physical Exam  Constitutional: She appears well-developed and well-nourished.  HENT:  Head: Normocephalic and atraumatic.  Right Ear: Tympanic membrane, external ear and ear canal normal.  Left Ear: External ear and ear canal normal. Tympanic membrane is bulging (fluid).  Nose: Nose normal. No mucosal edema or  rhinorrhea.  Mouth/Throat: Uvula is midline and mucous membranes are normal. Mucous membranes are not dry. No oral lesions. No trismus in the jaw. No uvula swelling. Posterior oropharyngeal erythema present. No oropharyngeal exudate, posterior oropharyngeal edema or tonsillar abscesses.  Eyes: Conjunctivae are normal. Right eye exhibits no discharge. Left eye exhibits no discharge.  Neck: Normal range of motion. Neck supple.  Cardiovascular: Normal rate, regular rhythm and normal heart sounds.   Pulmonary/Chest: Effort normal and breath sounds normal. No respiratory distress.  She has no wheezes. She has no rales.  Abdominal: Soft. There is no tenderness.  Lymphadenopathy:       Head (right side): No submandibular, no tonsillar, no preauricular, no posterior auricular and no occipital adenopathy present.       Head (left side): Tonsillar (tender) adenopathy present. No submandibular, no preauricular, no posterior auricular and no occipital adenopathy present.    She has no cervical adenopathy.  Neurological: She is alert.  Skin: Skin is warm and dry.  Psychiatric: She has a normal mood and affect.  Nursing note and vitals reviewed.    ED Treatments / Results  Labs (all labs ordered are listed, but only abnormal results are displayed) Labs Reviewed  RAPID STREP SCREEN (NOT AT [facility])  CULTURE, GROUP A STREP [facility])    Procedures Procedures (including critical care time)   Initial Impression / Assessment and Plan / ED Course  I have reviewed the triage vital signs and the nursing notes.  Pertinent labs & imaging results that were available during my care of the patient were reviewed by me and considered in my medical decision making (see chart for details).  Clinical Course    Patient seen and examined. Work-up initiated. Will test for strep. Otherwise, symptomatic treatment.  Vital signs reviewed and are as follows: BP 143/93   Pulse 97   Temp 98.2 F (36.8 C)   Resp 18   Ht 5\' 7"  (1.702 m)   Wt 81.6 kg   LMP 11/26/2016   SpO2 98%   BMI 28.19 kg/m   1:36 PM Pt informed of neg strep. Rx sudafed and naproxen. Patient counseled on supportive care for viral URI and s/s to return including worsening symptoms, persistent fever, persistent vomiting, or if they have any other concerns. Urged to see PCP if symptoms persist for more than 3 days. Patient verbalizes understanding and agrees with plan.    Final Clinical Impressions(s) / ED Diagnoses   Final diagnoses:  Upper respiratory tract infection, unspecified type   URI: Patient with symptoms  consistent with a viral syndrome. Vitals are stable, no fever. No signs of dehydration. Lung exam normal, no signs of pneumonia. Supportive therapy indicated with return if symptoms worsen.     New Prescriptions New Prescriptions   NAPROXEN (NAPROSYN) 500 MG TABLET    Take 1 tablet (500 mg total) by mouth 2 (two) times daily.   PSEUDOEPHEDRINE (SUDAFED) 60 MG TABLET    Take 1 tablet (60 mg total) by mouth every 4 (four) hours as needed for congestion.     Carlisle Cater, PA-C 12/05/16 1337    Isla Pence, MD 12/05/16 [phone]

## 2016-12-05 NOTE — ED Notes (Signed)
ED Provider at bedside. 

## 2016-12-07 LAB — CULTURE, GROUP A STREP (THRC)

## 2017-05-30 ENCOUNTER — Emergency Department (HOSPITAL_BASED_OUTPATIENT_CLINIC_OR_DEPARTMENT_OTHER)
Admission: EM | Admit: 2017-05-30 | Discharge: 2017-05-30 | Disposition: A | Discharge status: Home / Self Care | Payer: Commercial Managed Care - PPO | Attending: Emergency Medicine | Admitting: Emergency Medicine

## 2017-05-30 ENCOUNTER — Encounter (HOSPITAL_BASED_OUTPATIENT_CLINIC_OR_DEPARTMENT_OTHER): Payer: Self-pay | Admitting: *Deleted

## 2017-05-30 DIAGNOSIS — Z79899 Other long term (current) drug therapy: Secondary | ICD-10-CM | POA: Insufficient documentation

## 2017-05-30 DIAGNOSIS — F17210 Nicotine dependence, cigarettes, uncomplicated: Secondary | ICD-10-CM | POA: Insufficient documentation

## 2017-05-30 DIAGNOSIS — R739 Hyperglycemia, unspecified: Secondary | ICD-10-CM

## 2017-05-30 DIAGNOSIS — J449 Chronic obstructive pulmonary disease, unspecified: Secondary | ICD-10-CM | POA: Insufficient documentation

## 2017-05-30 DIAGNOSIS — Z794 Long term (current) use of insulin: Secondary | ICD-10-CM | POA: Insufficient documentation

## 2017-05-30 DIAGNOSIS — R0789 Other chest pain: Secondary | ICD-10-CM

## 2017-05-30 DIAGNOSIS — R079 Chest pain, unspecified: Secondary | ICD-10-CM | POA: Insufficient documentation

## 2017-05-30 DIAGNOSIS — E119 Type 2 diabetes mellitus without complications: Secondary | ICD-10-CM | POA: Insufficient documentation

## 2017-05-30 HISTORY — DX: Asymptomatic human immunodeficiency virus (hiv) infection status: Z21

## 2017-05-30 HISTORY — DX: Human immunodeficiency virus (HIV) disease: B20

## 2017-05-30 LAB — COMPREHENSIVE METABOLIC PANEL
ALK PHOS: 61 U/L (ref 38–126)
ALT: 26 U/L (ref 14–54)
AST: 25 U/L (ref 15–41)
Albumin: 3.2 g/dL — ABNORMAL LOW (ref 3.5–5.0)
Anion gap: 6 (ref 5–15)
BUN: 10 mg/dL (ref 6–20)
CALCIUM: 8.7 mg/dL — AB (ref 8.9–10.3)
CO2: 22 mmol/L (ref 22–32)
CREATININE: 0.47 mg/dL (ref 0.44–1.00)
Chloride: 101 mmol/L (ref 101–111)
GFR calc Af Amer: >60 mL/min (ref >60)
GFR calc non Af Amer: >60 mL/min (ref >60)
Glucose, Bld: 486 mg/dL — ABNORMAL HIGH (ref 65–99)
Potassium: 3.8 mmol/L (ref 3.5–5.1)
SODIUM: 129 mmol/L — AB (ref 135–145)
Total Bilirubin: 0.3 mg/dL (ref 0.3–1.2)
Total Protein: 7.3 g/dL (ref 6.5–8.1)

## 2017-05-30 LAB — CBG MONITORING, ED
GLUCOSE-CAPILLARY: 344 mg/dL — AB (ref 65–99)
Glucose-Capillary: 535 mg/dL (ref 65–99)

## 2017-05-30 LAB — CBC WITH DIFFERENTIAL/PLATELET
Basophils Absolute: 0 10*3/uL (ref 0.0–0.1)
Basophils Relative: 0 %
Eosinophils Absolute: 0 10*3/uL (ref 0.0–0.7)
Eosinophils Relative: 1 %
HCT: 41.4 % (ref 36.0–46.0)
HEMOGLOBIN: 15.1 g/dL — AB (ref 12.0–15.0)
LYMPHS ABS: 2 10*3/uL (ref 0.7–4.0)
LYMPHS PCT: 34 %
MCH: 31.5 pg (ref 26.0–34.0)
MCHC: 36.5 g/dL — ABNORMAL HIGH (ref 30.0–36.0)
MCV: 86.3 fL (ref 78.0–100.0)
Monocytes Absolute: 0.7 10*3/uL (ref 0.1–1.0)
Monocytes Relative: 13 %
NEUTROS PCT: 52 %
Neutro Abs: 3 10*3/uL (ref 1.7–7.7)
Platelets: 171 10*3/uL (ref 150–400)
RBC: 4.8 MIL/uL (ref 3.87–5.11)
RDW: 11.9 % (ref 11.5–15.5)
WBC: 5.7 10*3/uL (ref 4.0–10.5)

## 2017-05-30 LAB — TROPONIN I: Troponin I: <0.03 ng/mL (ref <0.03)

## 2017-05-30 MED ORDER — INSULIN ASPART 100 UNIT/ML ~~LOC~~ SOLN
8.0000 [IU] | Freq: Once | SUBCUTANEOUS | Status: AC
  Administered 2017-05-30: 8 [IU] via INTRAVENOUS
  Filled 2017-05-30: qty 1

## 2017-05-30 MED ORDER — SODIUM CHLORIDE 0.9 % IV BOLUS (SEPSIS)
1000.0000 mL | Freq: Once | INTRAVENOUS | Status: AC
  Administered 2017-05-30: 1000 mL via INTRAVENOUS

## 2017-05-30 NOTE — Discharge Instructions (Signed)
Continue your medications as previously prescribed.  Keep a record of your blood sugars over the next week and take this with you to your next doctor's appointment so that he can make the appropriate changes to your medications.  Return to the emergency department if your symptoms significantly worsen or change in the meantime.

## 2017-05-30 NOTE — ED Provider Notes (Signed)
[facility] DEPT [facility] Provider Note   CSN: [id number] Arrival date & time: 05/30/17  0214     History   Chief Complaint Chief Complaint  Patient presents with  . Chest Pain    HPI Renee Bryant is a 36 y.o. female.  Patient is a 36 year old female with past mental history of diabetes, HIV disease, COPD presenting for evaluation of left arm tingling and elevated blood sugar. She reports feeling bad this evening, then checking her blood sugar and the reading stated "high". She does report excessive thirst and urination. She denies any fevers or chills. She denies any difficulty breathing. She does report feeling tight in the chest. She has no prior cardiac history.   The history is provided by the patient.  Chest Pain   This is a new problem. The current episode started 3 to 5 hours ago. The problem occurs constantly. The problem has not changed since onset.The pain is present in the substernal region. The pain is mild. The quality of the pain is described as pressure-like. The pain does not radiate. She has tried nothing for the symptoms. The treatment provided no relief.    Past Medical History:  Diagnosis Date  . Abscess   . COPD (chronic obstructive pulmonary disease) ([place])   . Diabetes mellitus without complication ([place])   . HIV (human immunodeficiency virus infection) ([place])     There are no active problems to display for this patient.   Past Surgical History:  Procedure Laterality Date  . CESAREAN SECTION    . CHOLECYSTECTOMY    . TUBAL LIGATION      OB History    No data available       Home Medications    Prior to Admission medications   Medication Sig Start Date End Date Taking? Authorizing Provider  albuterol (PROVENTIL HFA;VENTOLIN HFA) 108 (90 Base) MCG/ACT inhaler Inhale into the lungs every 6 (six) hours as needed for wheezing or shortness of breath.    [provider]  clonazePAM (KLONOPIN) 0.5 MG tablet Take 0.5 mg by mouth 2 (two)  times daily.    [provider]  insulin NPH-regular Human (NOVOLIN 70/30) (70-30) 100 UNIT/ML injection Inject 30 Units into the skin 2 (two) times daily with a meal. 10/25/16   Montine Circle, PA-C  naproxen (NAPROSYN) 500 MG tablet Take 1 tablet (500 mg total) by mouth 2 (two) times daily. 12/05/16   Carlisle Cater, PA-C  omeprazole (PRILOSEC) 20 MG capsule Take 20 mg by mouth daily.    [provider]  pseudoephedrine (SUDAFED) 60 MG tablet Take 1 tablet (60 mg total) by mouth every 4 (four) hours as needed for congestion. 12/05/16   Carlisle Cater, PA-C    Family History No family history on file.  Social History Social History  Substance Use Topics  . Smoking status: Current Some Day Smoker    Packs/day: 1.00    Types: Cigarettes  . Smokeless tobacco: Never Used  . Alcohol use Yes     Allergies   Wellbutrin [bupropion]   Review of Systems Review of Systems  Cardiovascular: Positive for chest pain.  All other systems reviewed and are negative.    Physical Exam Updated Vital Signs BP (!) 158/97 (BP Location: Right Arm)   Pulse 81   Temp 98.6 F (37 C) (Oral)   Resp 18   Ht 5\' 7"  (1.702 m)   Wt 77.1 kg (170 lb)   SpO2 99%   BMI 26.63 kg/m   Physical  Exam  Constitutional: She is oriented to person, place, and time. She appears well-developed and well-nourished. No distress.  HENT:  Head: Normocephalic and atraumatic.  Mouth/Throat: Oropharynx is clear and moist.  Eyes: EOM are normal. Pupils are equal, round, and reactive to light.  Neck: Normal range of motion. Neck supple.  Cardiovascular: Normal rate and regular rhythm.  Exam reveals no gallop and no friction rub.   No murmur heard. Pulmonary/Chest: Effort normal and breath sounds normal. No respiratory distress. She has no wheezes.  Abdominal: Soft. Bowel sounds are normal. She exhibits no distension. There is no tenderness.  Musculoskeletal: Normal range of motion. She exhibits no  edema.  Neurological: She is alert and oriented to person, place, and time. No cranial nerve deficit. She exhibits normal muscle tone. Coordination normal.  Skin: Skin is warm and dry. She is not diaphoretic.  Nursing note and vitals reviewed.    ED Treatments / Results  Labs (all labs ordered are listed, but only abnormal results are displayed) Labs Reviewed  CBG MONITORING, ED - Abnormal; Notable for the following:       Result Value   Glucose-Capillary 535 (*)    All other components within normal limits  CBC WITH DIFFERENTIAL/PLATELET  COMPREHENSIVE METABOLIC PANEL  TROPONIN I    EKG  EKG Interpretation  Date/Time:  Thursday May 30 2017 02:31:23 EDT Ventricular Rate:  80 PR Interval:    QRS Duration: 86 QT Interval:  379 QTC Calculation: 438 R Axis:   56 Text Interpretation:  Sinus rhythm Normal ECG Confirmed by Veryl Speak [phone]) on 05/30/2017 2:37:50 AM       Radiology No results found.  Procedures Procedures (including critical care time)  Medications Ordered in ED Medications  sodium chloride 0.9 % bolus 1,000 mL (not administered)  insulin aspart (novoLOG) injection 8 Units (not administered)     Initial Impression / Assessment and Plan / ED Course  I have reviewed the triage vital signs and the nursing notes.  Pertinent labs & imaging results that were available during my care of the patient were reviewed by me and considered in my medical decision making (see chart for details).  Patient presents here with complaints of left arm tingling and elevated blood sugar. Her initial sugar was 535, however electrolytes reveal no sign of ketoacidosis. Her EKG is normal and troponin is negative. She was given insulin and fluids and her sugars are now improving. She is now feeling better. I see no evidence of a cardiac event and believe she is appropriate for discharge. She is to follow-up with primary Dr. as scheduled next week. I've advised her to keep a record  of her blood sugars over the next several days so that she can take this with her to her next appointment and make changes to her medications accordingly.  Final Clinical Impressions(s) / ED Diagnoses   Final diagnoses:  None    New Prescriptions New Prescriptions   No medications on file     Veryl Speak, MD 05/30/17 [phone]

## 2017-05-30 NOTE — ED Triage Notes (Signed)
C/o left arm soreness then started tingling/numbness,  Then chest pain,  Checked cbg which was high,  Does not take her meds for diabetics  Did take 20 units of lantus pta,  Does not normally take insulin

## 2017-09-03 ENCOUNTER — Emergency Department (HOSPITAL_BASED_OUTPATIENT_CLINIC_OR_DEPARTMENT_OTHER)
Admission: EM | Admit: 2017-09-03 | Discharge: 2017-09-03 | Disposition: A | Discharge status: Home / Self Care | Payer: Commercial Managed Care - PPO | Attending: Emergency Medicine | Admitting: Emergency Medicine

## 2017-09-03 ENCOUNTER — Encounter (HOSPITAL_BASED_OUTPATIENT_CLINIC_OR_DEPARTMENT_OTHER): Payer: Self-pay

## 2017-09-03 DIAGNOSIS — B20 Human immunodeficiency virus [HIV] disease: Secondary | ICD-10-CM | POA: Insufficient documentation

## 2017-09-03 DIAGNOSIS — Z794 Long term (current) use of insulin: Secondary | ICD-10-CM | POA: Insufficient documentation

## 2017-09-03 DIAGNOSIS — Z79899 Other long term (current) drug therapy: Secondary | ICD-10-CM | POA: Insufficient documentation

## 2017-09-03 DIAGNOSIS — R590 Localized enlarged lymph nodes: Secondary | ICD-10-CM | POA: Insufficient documentation

## 2017-09-03 DIAGNOSIS — F17210 Nicotine dependence, cigarettes, uncomplicated: Secondary | ICD-10-CM | POA: Insufficient documentation

## 2017-09-03 DIAGNOSIS — E119 Type 2 diabetes mellitus without complications: Secondary | ICD-10-CM | POA: Insufficient documentation

## 2017-09-03 DIAGNOSIS — J449 Chronic obstructive pulmonary disease, unspecified: Secondary | ICD-10-CM | POA: Insufficient documentation

## 2017-09-03 LAB — CBG MONITORING, ED: Glucose-Capillary: 298 mg/dL — ABNORMAL HIGH (ref 65–99)

## 2017-09-03 NOTE — ED Provider Notes (Signed)
[facility] DEPT [facility] Provider Note   CSN: [id number] Arrival date & time: 09/03/17  1600     History   Chief Complaint Chief Complaint  Patient presents with  . Neck Pain    HPI Su Duma is a 36 y.o. female.  36yo F w/ PMH including HIV on HAART, IDDM, COPD who p/w neck pain. Patient has had 2 days of pain on her left anterior neck below her jaw with a swollen lymph node. She has almost no pain at rest but it is tender to palpation and she has mild pain if she extends her neck. She denies any problems moving her neck, sore throat, trouble swallowing, breathing problems, fever, cough, nasal congestion, or tooth pain. She has had some mild left ear pain associated with the area of neck pain also for the past 2 days. She denies any other complaints and states that she has been well recently. She states she is compliant with medications. No sick contacts.   The history is provided by the patient.  Neck Pain      Past Medical History:  Diagnosis Date  . Abscess   . COPD (chronic obstructive pulmonary disease) ([place])   . Diabetes mellitus without complication ([place])   . HIV (human immunodeficiency virus infection) ([place])     There are no active problems to display for this patient.   Past Surgical History:  Procedure Laterality Date  . CESAREAN SECTION    . CHOLECYSTECTOMY    . TUBAL LIGATION      OB History    No data available       Home Medications    Prior to Admission medications   Medication Sig Start Date End Date Taking? Authorizing Provider  albuterol (PROVENTIL HFA;VENTOLIN HFA) 108 (90 Base) MCG/ACT inhaler Inhale into the lungs every 6 (six) hours as needed for wheezing or shortness of breath.    [provider]  clonazePAM (KLONOPIN) 0.5 MG tablet Take 0.5 mg by mouth 2 (two) times daily.    [provider]  insulin NPH-regular Human (NOVOLIN 70/30) (70-30) 100 UNIT/ML injection Inject 30 Units into the skin 2 (two) times daily  with a meal. 10/25/16   Montine Circle, PA-C  naproxen (NAPROSYN) 500 MG tablet Take 1 tablet (500 mg total) by mouth 2 (two) times daily. 12/05/16   Carlisle Cater, PA-C  omeprazole (PRILOSEC) 20 MG capsule Take 20 mg by mouth daily.    [provider]  pseudoephedrine (SUDAFED) 60 MG tablet Take 1 tablet (60 mg total) by mouth every 4 (four) hours as needed for congestion. 12/05/16   Carlisle Cater, PA-C    Family History No family history on file.  Social History Social History  Substance Use Topics  . Smoking status: Current Some Day Smoker    Packs/day: 1.00    Types: Cigarettes  . Smokeless tobacco: Never Used  . Alcohol use Yes     Allergies   Wellbutrin [bupropion]   Review of Systems Review of Systems  Musculoskeletal: Positive for neck pain.     Physical Exam Updated Vital Signs BP (!) 136/95 (BP Location: Left Arm)   Pulse 89   Temp 98.3 F (36.8 C) (Oral)   Resp 18   Ht 5\' 7"  (1.702 m)   Wt 77.1 kg (170 lb)   LMP 08/27/2017   SpO2 100%   BMI 26.63 kg/m   Physical Exam  Constitutional: She is oriented to person, place, and time. She appears well-developed and well-nourished. No  distress.  HENT:  Head: Normocephalic and atraumatic.  Right Ear: Tympanic membrane and ear canal normal.  Left Ear: Tympanic membrane and ear canal normal.  Mouth/Throat: Oropharynx is clear and moist. No oropharyngeal exudate.  Moist mucous membranes Normal dentition  Eyes: Pupils are equal, round, and reactive to light. Conjunctivae are normal.  Neck: Normal range of motion. Neck supple.  Small mobile anterior left cervical lymph node mildly tender to palpation  Cardiovascular: Normal rate, regular rhythm and normal heart sounds.   No murmur heard. Pulmonary/Chest: Effort normal and breath sounds normal.  Abdominal: Soft. Bowel sounds are normal. She exhibits no distension. There is no tenderness.  Musculoskeletal: She exhibits no edema.  Lymphadenopathy:     She has cervical adenopathy.  Neurological: She is alert and oriented to person, place, and time.  Fluent speech  Skin: Skin is warm and dry. No rash noted. No erythema.  Psychiatric: She has a normal mood and affect. Judgment normal.  Nursing note and vitals reviewed.    ED Treatments / Results  Labs (all labs ordered are listed, but only abnormal results are displayed) Labs Reviewed  CBG MONITORING, ED - Abnormal; Notable for the following:       Result Value   Glucose-Capillary 298 (*)    All other components within normal limits    EKG  EKG Interpretation None       Radiology No results found.  Procedures Procedures (including critical care time)  Medications Ordered in ED Medications - No data to display   Initial Impression / Assessment and Plan / ED Course  I have reviewed the triage vital signs and the nursing notes.      PT w/ 2 days of tender left cervical lymphadenopathy, mild ear pain but no evidence of AOM on exam. VS normal, no infectious sx. No sore throat and no evidence of skin infection. Discussed supportive measures and f/u with PCP in a few weeks if not resolved spontaneously. Return precautions reviewed.  Final Clinical Impressions(s) / ED Diagnoses   Final diagnoses:  Cervical lymphadenopathy    New Prescriptions New Prescriptions   No medications on file     Andreina Outten, Wenda Overland, MD 09/03/17 1713

## 2017-09-03 NOTE — ED Triage Notes (Signed)
Pt c/o tenderness to anterior neck "around lymph nodes." Pt denies URI symptoms, denies injury.

## 2017-10-06 ENCOUNTER — Emergency Department (HOSPITAL_BASED_OUTPATIENT_CLINIC_OR_DEPARTMENT_OTHER): Payer: Self-pay

## 2017-10-06 ENCOUNTER — Other Ambulatory Visit: Payer: Self-pay

## 2017-10-06 ENCOUNTER — Emergency Department (HOSPITAL_BASED_OUTPATIENT_CLINIC_OR_DEPARTMENT_OTHER)
Admission: EM | Admit: 2017-10-06 | Discharge: 2017-10-07 | Disposition: A | Discharge status: Home / Self Care | Payer: Self-pay | Attending: Emergency Medicine | Admitting: Emergency Medicine

## 2017-10-06 ENCOUNTER — Encounter (HOSPITAL_BASED_OUTPATIENT_CLINIC_OR_DEPARTMENT_OTHER): Payer: Self-pay | Admitting: Emergency Medicine

## 2017-10-06 DIAGNOSIS — B20 Human immunodeficiency virus [HIV] disease: Secondary | ICD-10-CM | POA: Insufficient documentation

## 2017-10-06 DIAGNOSIS — R1031 Right lower quadrant pain: Secondary | ICD-10-CM | POA: Insufficient documentation

## 2017-10-06 DIAGNOSIS — N888 Other specified noninflammatory disorders of cervix uteri: Secondary | ICD-10-CM | POA: Insufficient documentation

## 2017-10-06 DIAGNOSIS — Z791 Long term (current) use of non-steroidal anti-inflammatories (NSAID): Secondary | ICD-10-CM | POA: Insufficient documentation

## 2017-10-06 DIAGNOSIS — R739 Hyperglycemia, unspecified: Secondary | ICD-10-CM | POA: Insufficient documentation

## 2017-10-06 DIAGNOSIS — E119 Type 2 diabetes mellitus without complications: Secondary | ICD-10-CM | POA: Insufficient documentation

## 2017-10-06 DIAGNOSIS — J449 Chronic obstructive pulmonary disease, unspecified: Secondary | ICD-10-CM | POA: Insufficient documentation

## 2017-10-06 DIAGNOSIS — Z79899 Other long term (current) drug therapy: Secondary | ICD-10-CM | POA: Insufficient documentation

## 2017-10-06 DIAGNOSIS — Z794 Long term (current) use of insulin: Secondary | ICD-10-CM | POA: Insufficient documentation

## 2017-10-06 DIAGNOSIS — F17210 Nicotine dependence, cigarettes, uncomplicated: Secondary | ICD-10-CM | POA: Insufficient documentation

## 2017-10-06 LAB — URINALYSIS, ROUTINE W REFLEX MICROSCOPIC
Bilirubin Urine: NEGATIVE
Glucose, UA: >=500 mg/dL — AB
Hgb urine dipstick: NEGATIVE
Ketones, ur: NEGATIVE mg/dL
Leukocytes, UA: NEGATIVE
Nitrite: NEGATIVE
Protein, ur: 30 mg/dL — AB
Specific Gravity, Urine: 1.015 (ref 1.005–1.030)
pH: 6 (ref 5.0–8.0)

## 2017-10-06 LAB — COMPREHENSIVE METABOLIC PANEL
ALT: 19 U/L (ref 14–54)
AST: 21 U/L (ref 15–41)
Albumin: 3.3 g/dL — ABNORMAL LOW (ref 3.5–5.0)
Alkaline Phosphatase: 47 U/L (ref 38–126)
Anion gap: 7 (ref 5–15)
BUN: 12 mg/dL (ref 6–20)
CO2: 22 mmol/L (ref 22–32)
Calcium: 9 mg/dL (ref 8.9–10.3)
Chloride: 103 mmol/L (ref 101–111)
Creatinine, Ser: 0.59 mg/dL (ref 0.44–1.00)
GFR calc Af Amer: >60 mL/min (ref >60)
GFR calc non Af Amer: >60 mL/min (ref >60)
Glucose, Bld: 393 mg/dL — ABNORMAL HIGH (ref 65–99)
Potassium: 3.8 mmol/L (ref 3.5–5.1)
Sodium: 132 mmol/L — ABNORMAL LOW (ref 135–145)
Total Bilirubin: 0.5 mg/dL (ref 0.3–1.2)
Total Protein: 7.2 g/dL (ref 6.5–8.1)

## 2017-10-06 LAB — CBC
HCT: 39.4 % (ref 36.0–46.0)
Hemoglobin: 14.1 g/dL (ref 12.0–15.0)
MCH: 30.7 pg (ref 26.0–34.0)
MCHC: 35.8 g/dL (ref 30.0–36.0)
MCV: 85.7 fL (ref 78.0–100.0)
Platelets: 175 10*3/uL (ref 150–400)
RBC: 4.6 MIL/uL (ref 3.87–5.11)
RDW: 12.2 % (ref 11.5–15.5)
WBC: 7.8 10*3/uL (ref 4.0–10.5)

## 2017-10-06 LAB — URINALYSIS, MICROSCOPIC (REFLEX): Bacteria, UA: NONE SEEN

## 2017-10-06 LAB — LIPASE, BLOOD: Lipase: 31 U/L (ref 11–51)

## 2017-10-06 MED ORDER — IOPAMIDOL (ISOVUE-300) INJECTION 61%
100.0000 mL | Freq: Once | INTRAVENOUS | Status: AC | PRN
  Administered 2017-10-06: 100 mL via INTRAVENOUS

## 2017-10-06 MED ORDER — HYDROMORPHONE HCL 1 MG/ML IJ SOLN
0.5000 mg | Freq: Once | INTRAMUSCULAR | Status: AC
  Administered 2017-10-06: 0.5 mg via INTRAVENOUS
  Filled 2017-10-06: qty 1

## 2017-10-06 MED ORDER — ONDANSETRON HCL 4 MG/2ML IJ SOLN
4.0000 mg | Freq: Once | INTRAMUSCULAR | Status: AC | PRN
  Administered 2017-10-06: 4 mg via INTRAVENOUS
  Filled 2017-10-06: qty 2

## 2017-10-06 NOTE — ED Provider Notes (Signed)
[facility] EMERGENCY DEPARTMENT Provider Note   CSN: [id number] Arrival date & time: 10/06/17  2039     History   Chief Complaint Chief Complaint  Patient presents with  . Fall    HPI Renee Bryant is a 36 y.o. female.  Patient with past history of cholecystectomy, HIV, insulin dependent DM -- presents with complaint of gradually worsening right lower quadrant abdominal pain with radiation to right flank starting this morning.  Onset was acute.  Associated nausea but no vomiting.  Decreased appetite, last oral intake was this morning.  No fever, diarrhea, constipation.  Patient denies vaginal bleeding or discharge.  Last menstrual period was 2 weeks ago and was normal for the patient.  No treatments prior to arrival.  Patient does report having a fall yesterday over an outdoor dog kennel when she went out to walk her dog.  She fell onto her right side but did not have immediate pain in the abdomen areas.  She sustained a abrasion to her left ankle.      Past Medical History:  Diagnosis Date  . Abscess   . COPD (chronic obstructive pulmonary disease) ([place])   . Diabetes mellitus without complication ([place])   . HIV (human immunodeficiency virus infection) ([place])     There are no active problems to display for this patient.   Past Surgical History:  Procedure Laterality Date  . CESAREAN SECTION    . CHOLECYSTECTOMY    . TUBAL LIGATION      OB History    No data available       Home Medications    Prior to Admission medications   Medication Sig Start Date End Date Taking? Authorizing Provider  albuterol (PROVENTIL HFA;VENTOLIN HFA) 108 (90 Base) MCG/ACT inhaler Inhale into the lungs every 6 (six) hours as needed for wheezing or shortness of breath.    [provider]  clonazePAM (KLONOPIN) 0.5 MG tablet Take 0.5 mg by mouth 2 (two) times daily.    [provider]  insulin NPH-regular Human (NOVOLIN 70/30) (70-30) 100 UNIT/ML injection  Inject 30 Units into the skin 2 (two) times daily with a meal. 10/25/16   Montine Circle, PA-C  naproxen (NAPROSYN) 500 MG tablet Take 1 tablet (500 mg total) by mouth 2 (two) times daily. 12/05/16   Carlisle Cater, PA-C  omeprazole (PRILOSEC) 20 MG capsule Take 20 mg by mouth daily.    [provider]  pseudoephedrine (SUDAFED) 60 MG tablet Take 1 tablet (60 mg total) by mouth every 4 (four) hours as needed for congestion. 12/05/16   Carlisle Cater, PA-C    Family History No family history on file.  Social History Social History   Tobacco Use  . Smoking status: Current Some Day Smoker    Packs/day: 1.00    Types: Cigarettes  . Smokeless tobacco: Never Used  Substance Use Topics  . Alcohol use: Yes  . Drug use: No     Allergies   Wellbutrin [bupropion]   Review of Systems Review of Systems  Constitutional: Positive for appetite change. Negative for fever.  HENT: Negative for rhinorrhea and sore throat.   Eyes: Negative for redness.  Respiratory: Negative for cough.   Cardiovascular: Negative for chest pain.  Gastrointestinal: Positive for abdominal pain and nausea. Negative for diarrhea and vomiting.  Genitourinary: Positive for flank pain. Negative for dysuria.  Musculoskeletal: Negative for myalgias.  Skin: Positive for wound. Negative for rash.  Neurological: Negative for headaches.     Physical  Exam Updated Vital Signs BP (!) 151/111 (BP Location: Left Arm)   Pulse 94   Temp 98 F (36.7 C) (Oral)   Resp 20   Ht 5\' 7"  (1.702 m)   Wt 79.4 kg (175 lb)   LMP 09/22/2017   SpO2 99%   BMI 27.41 kg/m   Physical Exam  Constitutional: She appears well-developed and well-nourished.  HENT:  Head: Normocephalic and atraumatic.  Mouth/Throat: Oropharynx is clear and moist.  Eyes: Conjunctivae are normal. Right eye exhibits no discharge. Left eye exhibits no discharge.  Neck: Normal range of motion. Neck supple.  Cardiovascular: Normal rate, regular rhythm  and normal heart sounds.  Pulmonary/Chest: Effort normal and breath sounds normal. No stridor. No respiratory distress. She has no wheezes.  Abdominal: Soft. There is tenderness. There is tenderness at McBurney's point. There is no rebound and no guarding.  Neurological: She is alert.  Skin: Skin is warm and dry.  Slight abrasion to left anterior lateral ankle.  No swelling.  Psychiatric: She has a normal mood and affect.  Nursing note and vitals reviewed.    ED Treatments / Results  Labs (all labs ordered are listed, but only abnormal results are displayed) Labs Reviewed  COMPREHENSIVE METABOLIC PANEL - Abnormal; Notable for the following components:      Result Value   Sodium 132 (*)    Glucose, Bld 393 (*)    Albumin 3.3 (*)    All other components within normal limits  URINALYSIS, ROUTINE W REFLEX MICROSCOPIC - Abnormal; Notable for the following components:   Glucose, UA >=500 (*)    Protein, ur 30 (*)    All other components within normal limits  URINALYSIS, MICROSCOPIC (REFLEX) - Abnormal; Notable for the following components:   Squamous Epithelial / LPF 0-5 (*)    All other components within normal limits  LIPASE, BLOOD  CBC    Procedures Procedures (including critical care time)  Medications Ordered in ED Medications  ondansetron (ZOFRAN) injection 4 mg (4 mg Intravenous Given 10/06/17 2330)  HYDROmorphone (DILAUDID) injection 0.5 mg (0.5 mg Intravenous Given 10/06/17 2330)  iopamidol (ISOVUE-300) 61 % injection 100 mL (100 mLs Intravenous Contrast Given 10/06/17 2317)     Initial Impression / Assessment and Plan / ED Course  I have reviewed the triage vital signs and the nursing notes.  Pertinent labs & imaging results that were available during my care of the patient were reviewed by me and considered in my medical decision making (see chart for details).     Patient seen and examined. Work-up reviewed. Medications ordered. Will obtain CT given pain at  McBurney's point.   Vital signs reviewed and are as follows: BP (!) 151/111 (BP Location: Left Arm)   Pulse 94   Temp 98 F (36.7 C) (Oral)   Resp 20   Ht 5\' 7"  (1.702 m)   Wt 79.4 kg (175 lb)   LMP 09/22/2017   SpO2 99%   BMI 27.41 kg/m   12:41 AM patient updated on the CT results.  We discussed the finding of a cyst in the cervical/lower uterine area.  Patient states that she receives her GYN care through her primary care physician.  I offered pelvic exam tonight, discussed that we do not have ultrasound capability at this time of day.  She states that she would rather follow-up with her primary care physician so she can have everything done at once.  She feels confident that she can get an appointment this week.  Given that, we will discharge patient to home. The patient was urged to return to the Emergency Department immediately with worsening of current symptoms, worsening abdominal pain, persistent vomiting, blood noted in stools, fever, or any other concerns. The patient verbalized understanding.    Final Clinical Impressions(s) / ED Diagnoses   Final diagnoses:  Cervical cyst  Right lower quadrant abdominal pain  High blood sugar   Patient with abdominal pain.  Lab work is reassuring.  CT scan performed given isolated right lower quadrant pain.  This shows cervical cyst, normal appendix.  No indications for emergent GYN consultation at this point.  Patient to follow-up for further examination and possible pelvic ultrasound via her primary care physician.  Given her normal white count, lack of fever, low concern for infectious process.  Patient does have elevated blood sugars which sound to be normal for her.  No signs of ketosis.  ED Discharge Orders    None       Carlisle Cater, PA-C 10/07/17 9480    Merryl Hacker, MD 10/07/17 [phone]

## 2017-10-06 NOTE — ED Triage Notes (Signed)
PT presents with c/o left leg pain, RLQ pain from fall last night.

## 2017-10-07 NOTE — Discharge Instructions (Signed)
Please read and follow all provided instructions.  Your diagnoses today include:  1. Cervical cyst   2. Right lower quadrant abdominal pain   3. High blood sugar     Tests performed today include:  Blood counts and electrolytes -normal white blood cell count  Blood tests to check liver and kidney function -high blood sugar  Blood tests to check pancreas function  Urine test to look for infection and pregnancy (in women)  CT of your abdomen and pelvis -normal appendix but you have a cyst in the cervical area.  This will need to be further evaluated by her primary care doctor either by pelvic exam or with ultrasound.  Vital signs. See below for your results today.   Medications prescribed:   None  Take any prescribed medications only as directed.  Home care instructions:   Follow any educational materials contained in this packet.  Follow-up instructions: Please follow-up with your primary care provider in the next 5 days for further evaluation of your symptoms.    Return instructions:  SEEK IMMEDIATE MEDICAL ATTENTION IF:  The pain does not go away or becomes severe   A temperature above 101F develops   Repeated vomiting occurs (multiple episodes)   The pain becomes localized to portions of the abdomen. The right side could possibly be appendicitis. In an adult, the left lower portion of the abdomen could be colitis or diverticulitis.   Blood is being passed in stools or vomit (bright red or black tarry stools)   You develop chest pain, difficulty breathing, dizziness or fainting, or become confused, poorly responsive, or inconsolable (young children)  If you have any other emergent concerns regarding your health  Additional Information: Abdominal (belly) pain can be caused by many things. Your caregiver performed an examination and possibly ordered blood/urine tests and imaging (CT scan, x-rays, ultrasound). Many cases can be observed and treated at home after  initial evaluation in the emergency department. Even though you are being discharged home, abdominal pain can be unpredictable. Therefore, you need a repeated exam if your pain does not resolve, returns, or worsens. Most patients with abdominal pain don't have to be admitted to the hospital or have surgery, but serious problems like appendicitis and gallbladder attacks can start out as nonspecific pain. Many abdominal conditions cannot be diagnosed in one visit, so follow-up evaluations are very important.  Your vital signs today were: BP (!) 138/95 (BP Location: Left Arm)    Pulse 81    Temp 98 F (36.7 C) (Oral)    Resp 20    Ht 5\' 7"  (1.702 m)    Wt 79.4 kg (175 lb)    LMP 09/22/2017    SpO2 98%    BMI 27.41 kg/m  If your blood pressure (bp) was elevated above 135/85 this visit, please have this repeated by your doctor within one month. --------------

## 2017-10-21 ENCOUNTER — Emergency Department (HOSPITAL_BASED_OUTPATIENT_CLINIC_OR_DEPARTMENT_OTHER)
Admission: EM | Admit: 2017-10-21 | Discharge: 2017-10-21 | Disposition: A | Discharge status: Home / Self Care | Payer: Self-pay | Attending: Emergency Medicine | Admitting: Emergency Medicine

## 2017-10-21 ENCOUNTER — Other Ambulatory Visit: Payer: Self-pay

## 2017-10-21 ENCOUNTER — Emergency Department (HOSPITAL_BASED_OUTPATIENT_CLINIC_OR_DEPARTMENT_OTHER): Payer: Self-pay

## 2017-10-21 ENCOUNTER — Encounter (HOSPITAL_BASED_OUTPATIENT_CLINIC_OR_DEPARTMENT_OTHER): Payer: Self-pay

## 2017-10-21 DIAGNOSIS — F17210 Nicotine dependence, cigarettes, uncomplicated: Secondary | ICD-10-CM | POA: Insufficient documentation

## 2017-10-21 DIAGNOSIS — Z79899 Other long term (current) drug therapy: Secondary | ICD-10-CM | POA: Insufficient documentation

## 2017-10-21 DIAGNOSIS — J209 Acute bronchitis, unspecified: Secondary | ICD-10-CM | POA: Insufficient documentation

## 2017-10-21 DIAGNOSIS — Z21 Asymptomatic human immunodeficiency virus [HIV] infection status: Secondary | ICD-10-CM | POA: Insufficient documentation

## 2017-10-21 DIAGNOSIS — J449 Chronic obstructive pulmonary disease, unspecified: Secondary | ICD-10-CM | POA: Insufficient documentation

## 2017-10-21 DIAGNOSIS — Z9049 Acquired absence of other specified parts of digestive tract: Secondary | ICD-10-CM | POA: Insufficient documentation

## 2017-10-21 DIAGNOSIS — E119 Type 2 diabetes mellitus without complications: Secondary | ICD-10-CM | POA: Insufficient documentation

## 2017-10-21 MED ORDER — ALBUTEROL SULFATE HFA 108 (90 BASE) MCG/ACT IN AERS: 2.0000 | INHALATION_SPRAY | RESPIRATORY_TRACT | 0 refills | Status: DC | PRN

## 2017-10-21 MED ORDER — IPRATROPIUM-ALBUTEROL 0.5-2.5 (3) MG/3ML IN SOLN
3.0000 mL | Freq: Four times a day (QID) | RESPIRATORY_TRACT | Status: DC
  Administered 2017-10-21: 3 mL via RESPIRATORY_TRACT
  Filled 2017-10-21: qty 3

## 2017-10-21 MED ORDER — ALBUTEROL SULFATE HFA 108 (90 BASE) MCG/ACT IN AERS
2.0000 | INHALATION_SPRAY | RESPIRATORY_TRACT | Status: DC
  Administered 2017-10-21: 2 via RESPIRATORY_TRACT
  Filled 2017-10-21: qty 6.7

## 2017-10-21 MED ORDER — AZITHROMYCIN 250 MG PO TABS: 250.0000 mg | ORAL_TABLET | Freq: Every day | ORAL | 0 refills | Status: DC

## 2017-10-21 NOTE — ED Triage Notes (Signed)
C/o flu like sx x 1.5 weeks-NAD-steady gait

## 2017-10-21 NOTE — ED Provider Notes (Signed)
[facility] EMERGENCY DEPARTMENT Provider Note   CSN: [id number] Arrival date & time: 10/21/17  1254     History   Chief Complaint Chief Complaint  Patient presents with  . Cough    HPI Renee Bryant is a 36 y.o. female.  The history is provided by the patient. No language interpreter was used.  Cough  This is a new problem. The current episode started more than 1 week ago. The problem occurs constantly. The problem has been gradually worsening. The cough is productive of sputum. The maximum temperature recorded prior to her arrival was 100 to 100.9 F. Associated symptoms include ear congestion and headaches. The treatment provided no relief. Her past medical history does not include COPD.  Pt complains of a cough and congestion.  Pt reports chest tightness   Past Medical History:  Diagnosis Date  . Abscess   . COPD (chronic obstructive pulmonary disease) ([place])   . Diabetes mellitus without complication ([place])   . HIV (human immunodeficiency virus infection) ([place])     There are no active problems to display for this patient.   Past Surgical History:  Procedure Laterality Date  . CESAREAN SECTION    . CHOLECYSTECTOMY    . TUBAL LIGATION      OB History    No data available       Home Medications    Prior to Admission medications   Medication Sig Start Date End Date Taking? Authorizing Provider  Elviteg-Cobic-Emtricit-TenofAF (GENVOYA PO) Take by mouth.   Yes [provider]  GABAPENTIN PO Take by mouth.   Yes [provider]  albuterol (PROVENTIL HFA;VENTOLIN HFA) 108 (90 Base) MCG/ACT inhaler Inhale 2 puffs into the lungs every 4 (four) hours as needed for wheezing or shortness of breath. 10/21/17   Fransico Meadow, PA-C  azithromycin (ZITHROMAX) 250 MG tablet Take 1 tablet (250 mg total) by mouth daily. Take first 2 tablets together, then 1 every day until finished. 10/21/17   Fransico Meadow, PA-C  clonazePAM (KLONOPIN) 0.5 MG  tablet Take 0.5 mg by mouth 2 (two) times daily.    [provider]  naproxen (NAPROSYN) 500 MG tablet Take 1 tablet (500 mg total) by mouth 2 (two) times daily. 12/05/16   Carlisle Cater, PA-C  omeprazole (PRILOSEC) 20 MG capsule Take 20 mg by mouth daily.    [provider]  pseudoephedrine (SUDAFED) 60 MG tablet Take 1 tablet (60 mg total) by mouth every 4 (four) hours as needed for congestion. 12/05/16   Carlisle Cater, PA-C    Family History No family history on file.  Social History Social History   Tobacco Use  . Smoking status: Current Some Day Smoker    Packs/day: 1.00    Types: Cigarettes  . Smokeless tobacco: Never Used  Substance Use Topics  . Alcohol use: No    Frequency: Never  . Drug use: No     Allergies   Wellbutrin [bupropion]   Review of Systems Review of Systems  Respiratory: Positive for cough.   Neurological: Positive for headaches.  All other systems reviewed and are negative.    Physical Exam Updated Vital Signs BP (!) 142/98 (BP Location: Right Arm)   Pulse 96   Temp 98.3 F (36.8 C) (Oral)   Resp 18   LMP 10/21/2017   SpO2 100%   Physical Exam  Constitutional: She is oriented to person, place, and time. She appears well-developed and well-nourished.  HENT:  Head: Normocephalic.  Right Ear: External ear normal.  Left Ear: External ear normal.  Mouth/Throat: Oropharynx is clear and moist.  Eyes: EOM are normal. Pupils are equal, round, and reactive to light.  Neck: Normal range of motion.  Cardiovascular: Normal rate.  Pulmonary/Chest: Effort normal. She has wheezes.  Abdominal: Soft. She exhibits no distension.  Musculoskeletal: Normal range of motion.  Neurological: She is alert and oriented to person, place, and time.  Psychiatric: She has a normal mood and affect.  Nursing note and vitals reviewed.    ED Treatments / Results  Labs (all labs ordered are listed, but only abnormal results are displayed) Labs  Reviewed - No data to display  EKG  EKG Interpretation None       Radiology Dg Chest 2 View  Result Date: 10/21/2017 CLINICAL DATA:  Cough for a week EXAM: CHEST  2 VIEW COMPARISON:  10/25/2016 FINDINGS: Normal heart size. Lungs clear. No pneumothorax. No pleural effusion. IMPRESSION: No active cardiopulmonary disease. Electronically Signed   By: Marybelle Killings M.D.   On: 10/21/2017 14:01    Procedures Procedures (including critical care time)  Medications Ordered in ED Medications  ipratropium-albuterol (DUONEB) 0.5-2.5 (3) MG/3ML nebulizer solution 3 mL (3 mLs Nebulization Given 10/21/17 1358)     Initial Impression / Assessment and Plan / ED Course  I have reviewed the triage vital signs and the nursing notes.  Pertinent labs & imaging results that were available during my care of the patient were reviewed by me and considered in my medical decision making (see chart for details).     Pt feels better after breathing treatment.  Pt advised to see her MD for recheck in 2-3 days.  Final Clinical Impressions(s) / ED Diagnoses   Final diagnoses:  Acute bronchitis, unspecified organism    ED Discharge Orders        Ordered    albuterol (PROVENTIL HFA;VENTOLIN HFA) 108 (90 Base) MCG/ACT inhaler  Every 4 hours PRN     10/21/17 1433    azithromycin (ZITHROMAX) 250 MG tablet  Daily     10/21/17 1433    An After Visit Summary was printed and given to the patient.    Fransico Meadow, [place] 10/21/17 [place], MD 10/21/17 [phone]

## 2017-10-21 NOTE — ED Provider Notes (Signed)
[facility] EMERGENCY DEPARTMENT Provider Note   CSN: [id number] Arrival date & time: 10/21/17  1254     History   Chief Complaint Chief Complaint  Patient presents with  . Cough    HPI Renee Bryant is a 36 y.o. female.  The history is provided by the patient. No language interpreter was used.  Cough  This is a new problem. The problem occurs constantly. The problem has been gradually worsening. The cough is productive of sputum. There has been no fever. Associated symptoms include shortness of breath. She has tried nothing for the symptoms. The treatment provided no relief. She is not a smoker.  Pt complains of cough and congestion.  Pt reports coughing up phelgm.  Pt has nasal congestion  Past Medical History:  Diagnosis Date  . Abscess   . COPD (chronic obstructive pulmonary disease) ([place])   . Diabetes mellitus without complication ([place])   . HIV (human immunodeficiency virus infection) ([place])     There are no active problems to display for this patient.   Past Surgical History:  Procedure Laterality Date  . CESAREAN SECTION    . CHOLECYSTECTOMY    . TUBAL LIGATION      OB History    No data available       Home Medications    Prior to Admission medications   Medication Sig Start Date End Date Taking? Authorizing Provider  Elviteg-Cobic-Emtricit-TenofAF (GENVOYA PO) Take by mouth.   Yes [provider]  GABAPENTIN PO Take by mouth.   Yes [provider]  albuterol (PROVENTIL HFA;VENTOLIN HFA) 108 (90 Base) MCG/ACT inhaler Inhale 2 puffs into the lungs every 4 (four) hours as needed for wheezing or shortness of breath. 10/21/17   Fransico Meadow, PA-C  azithromycin (ZITHROMAX) 250 MG tablet Take 1 tablet (250 mg total) by mouth daily. Take first 2 tablets together, then 1 every day until finished. 10/21/17   Fransico Meadow, PA-C  clonazePAM (KLONOPIN) 0.5 MG tablet Take 0.5 mg by mouth 2 (two) times daily.    [provider]  naproxen (NAPROSYN) 500 MG tablet Take 1 tablet (500 mg total) by mouth 2 (two) times daily. 12/05/16   Carlisle Cater, PA-C  omeprazole (PRILOSEC) 20 MG capsule Take 20 mg by mouth daily.    [provider]  pseudoephedrine (SUDAFED) 60 MG tablet Take 1 tablet (60 mg total) by mouth every 4 (four) hours as needed for congestion. 12/05/16   Carlisle Cater, PA-C    Family History No family history on file.  Social History Social History   Tobacco Use  . Smoking status: Current Some Day Smoker    Packs/day: 1.00    Types: Cigarettes  . Smokeless tobacco: Never Used  Substance Use Topics  . Alcohol use: No    Frequency: Never  . Drug use: No     Allergies   Wellbutrin [bupropion]   Review of Systems Review of Systems  Respiratory: Positive for cough and shortness of breath.   All other systems reviewed and are negative.    Physical Exam Updated Vital Signs BP (!) 142/98 (BP Location: Right Arm)   Pulse 96   Temp 98.3 F (36.8 C) (Oral)   Resp 18   LMP 10/21/2017   SpO2 100%   Physical Exam  Constitutional: She is oriented to person, place, and time. She appears well-developed and well-nourished.  HENT:  Head: Normocephalic.  Right Ear: External ear normal.  Left Ear: External ear normal.  Nose: Nose normal.  Mouth/Throat: Oropharynx is clear and moist.  Eyes: EOM are normal. Pupils are equal, round, and reactive to light.  Neck: Normal range of motion.  Cardiovascular: Normal rate and regular rhythm.  Pulmonary/Chest:  Rhonchi and wheezing  Abdominal: Soft. She exhibits no distension.  Musculoskeletal: Normal range of motion.  Neurological: She is alert and oriented to person, place, and time.  Psychiatric: She has a normal mood and affect.  Nursing note and vitals reviewed.    ED Treatments / Results  Labs (all labs ordered are listed, but only abnormal results are displayed) Labs Reviewed - No data to display  EKG  EKG  Interpretation None       Radiology Dg Chest 2 View  Result Date: 10/21/2017 CLINICAL DATA:  Cough for a week EXAM: CHEST  2 VIEW COMPARISON:  10/25/2016 FINDINGS: Normal heart size. Lungs clear. No pneumothorax. No pleural effusion. IMPRESSION: No active cardiopulmonary disease. Electronically Signed   By: Marybelle Killings M.D.   On: 10/21/2017 14:01    Procedures Procedures (including critical care time)  Medications Ordered in ED Medications  ipratropium-albuterol (DUONEB) 0.5-2.5 (3) MG/3ML nebulizer solution 3 mL (3 mLs Nebulization Given 10/21/17 1358)     Initial Impression / Assessment and Plan / ED Course  I have reviewed the triage vital signs and the nursing notes.  Pertinent labs & imaging results that were available during my care of the patient were reviewed by me and considered in my medical decision making (see chart for details).     Pt given albuterol and atrovent neb.   Pt's 02 sats 99-100 no fever.    Final Clinical Impressions(s) / ED Diagnoses   Final diagnoses:  Acute bronchitis, unspecified organism    ED Discharge Orders        Ordered    albuterol (PROVENTIL HFA;VENTOLIN HFA) 108 (90 Base) MCG/ACT inhaler  Every 4 hours PRN     10/21/17 1433    azithromycin (ZITHROMAX) 250 MG tablet  Daily     10/21/17 1433       Fransico Meadow, [place] 10/21/17 1436    Fatima Blank, MD 10/21/17 1626

## 2017-10-21 NOTE — Discharge Instructions (Signed)
Return if any problems.

## 2017-11-17 ENCOUNTER — Emergency Department (HOSPITAL_BASED_OUTPATIENT_CLINIC_OR_DEPARTMENT_OTHER)
Admission: EM | Admit: 2017-11-17 | Discharge: 2017-11-18 | Disposition: A | Discharge status: Home / Self Care | Payer: Self-pay | Attending: Emergency Medicine | Admitting: Emergency Medicine

## 2017-11-17 ENCOUNTER — Other Ambulatory Visit: Payer: Self-pay

## 2017-11-17 ENCOUNTER — Encounter (HOSPITAL_BASED_OUTPATIENT_CLINIC_OR_DEPARTMENT_OTHER): Payer: Self-pay

## 2017-11-17 DIAGNOSIS — N939 Abnormal uterine and vaginal bleeding, unspecified: Secondary | ICD-10-CM

## 2017-11-17 DIAGNOSIS — E119 Type 2 diabetes mellitus without complications: Secondary | ICD-10-CM | POA: Insufficient documentation

## 2017-11-17 DIAGNOSIS — R739 Hyperglycemia, unspecified: Secondary | ICD-10-CM

## 2017-11-17 DIAGNOSIS — F17210 Nicotine dependence, cigarettes, uncomplicated: Secondary | ICD-10-CM | POA: Insufficient documentation

## 2017-11-17 DIAGNOSIS — R1030 Lower abdominal pain, unspecified: Secondary | ICD-10-CM

## 2017-11-17 DIAGNOSIS — J449 Chronic obstructive pulmonary disease, unspecified: Secondary | ICD-10-CM | POA: Insufficient documentation

## 2017-11-17 DIAGNOSIS — N938 Other specified abnormal uterine and vaginal bleeding: Secondary | ICD-10-CM | POA: Insufficient documentation

## 2017-11-17 LAB — COMPREHENSIVE METABOLIC PANEL
ALT: 25 U/L (ref 14–54)
AST: 29 U/L (ref 15–41)
Albumin: 3.2 g/dL — ABNORMAL LOW (ref 3.5–5.0)
Alkaline Phosphatase: 50 U/L (ref 38–126)
Anion gap: 8 (ref 5–15)
BUN: 8 mg/dL (ref 6–20)
CHLORIDE: 102 mmol/L (ref 101–111)
CO2: 24 mmol/L (ref 22–32)
CREATININE: 0.55 mg/dL (ref 0.44–1.00)
Calcium: 8.8 mg/dL — ABNORMAL LOW (ref 8.9–10.3)
GFR calc Af Amer: >60 mL/min (ref >60)
Glucose, Bld: 412 mg/dL — ABNORMAL HIGH (ref 65–99)
POTASSIUM: 3.9 mmol/L (ref 3.5–5.1)
SODIUM: 134 mmol/L — AB (ref 135–145)
Total Bilirubin: 0.5 mg/dL (ref 0.3–1.2)
Total Protein: 7.3 g/dL (ref 6.5–8.1)

## 2017-11-17 LAB — WET PREP, GENITAL
Sperm: NONE SEEN
Trich, Wet Prep: NONE SEEN
Yeast Wet Prep HPF POC: NONE SEEN

## 2017-11-17 LAB — CBC WITH DIFFERENTIAL/PLATELET
Basophils Absolute: 0 K/uL (ref 0.0–0.1)
Basophils Relative: 0 %
Eosinophils Absolute: 0.1 K/uL (ref 0.0–0.7)
Eosinophils Relative: 2 %
HCT: 41.6 % (ref 36.0–46.0)
Hemoglobin: 14.4 g/dL (ref 12.0–15.0)
Lymphocytes Relative: 51 %
Lymphs Abs: 3.1 K/uL (ref 0.7–4.0)
MCH: 29.6 pg (ref 26.0–34.0)
MCHC: 34.6 g/dL (ref 30.0–36.0)
MCV: 85.6 fL (ref 78.0–100.0)
Monocytes Absolute: 0.6 K/uL (ref 0.1–1.0)
Monocytes Relative: 10 %
Neutro Abs: 2.2 K/uL (ref 1.7–7.7)
Neutrophils Relative %: 37 %
Platelets: 187 K/uL (ref 150–400)
RBC: 4.86 MIL/uL (ref 3.87–5.11)
RDW: 12 % (ref 11.5–15.5)
WBC: 6.1 K/uL (ref 4.0–10.5)

## 2017-11-17 LAB — URINALYSIS, ROUTINE W REFLEX MICROSCOPIC
Bilirubin Urine: NEGATIVE
KETONES UR: NEGATIVE mg/dL
LEUKOCYTES UA: NEGATIVE
NITRITE: NEGATIVE
PROTEIN: NEGATIVE mg/dL
Specific Gravity, Urine: 1.015 (ref 1.005–1.030)
pH: 7 (ref 5.0–8.0)

## 2017-11-17 LAB — URINALYSIS, MICROSCOPIC (REFLEX)

## 2017-11-17 LAB — PREGNANCY, URINE: Preg Test, Ur: NEGATIVE

## 2017-11-17 LAB — LIPASE, BLOOD: Lipase: 29 U/L (ref 11–51)

## 2017-11-17 LAB — CBG MONITORING, ED: Glucose-Capillary: 431 mg/dL — ABNORMAL HIGH (ref 65–99)

## 2017-11-17 MED ORDER — TRAMADOL HCL 50 MG PO TABS: 50.0000 mg | ORAL_TABLET | Freq: Four times a day (QID) | ORAL | 0 refills | Status: DC | PRN

## 2017-11-17 MED ORDER — IBUPROFEN 800 MG PO TABS: 800.0000 mg | ORAL_TABLET | Freq: Three times a day (TID) | ORAL | 0 refills | Status: DC | PRN

## 2017-11-17 MED ORDER — KETOROLAC TROMETHAMINE 30 MG/ML IJ SOLN
30.0000 mg | Freq: Once | INTRAMUSCULAR | Status: AC
  Administered 2017-11-17: 30 mg via INTRAVENOUS
  Filled 2017-11-17: qty 1

## 2017-11-17 MED ORDER — NORGESTIMATE-ETH ESTRADIOL 0.25-35 MG-MCG PO TABS: 1.0000 | ORAL_TABLET | Freq: Every day | ORAL | 11 refills | Status: DC

## 2017-11-17 NOTE — Discharge Instructions (Signed)
You have received a prescription for a medication called Sprintec. This is an oral contraceptive. Take 3 pills a day for one week then throw the rest away. This should help with your uterine bleeding. It should cease during this time. After this week take one week off and during this time and will likely have a regular period.  This may cause a little bit of nausea when you're taking 3 a day if so you may back down to twice a day. Please follow-up with her gynecologist in 1-2 weeks for further evaluation and management of your vaginal bleeding.  Your blood sugar is very time and will cause problems in the future. Call the [facility] and [facility] number listed on this paperwork to schedule an appointment. They have resources there that can help you with affording your insulin.   I have prescribed Tramadol and Motrin for pain. Do NOT take Tramadol if you plan on driving a car.

## 2017-11-17 NOTE — ED Triage Notes (Addendum)
PT reports pelvic pain that has been intermittent for 3 weeks. Associated vaginal bleeding and migraine with blurry vision. States dropped off at ED. History of tubal ligation. States she thinks she has an abscess on her cervix. Pt is an insulin dependent diabetic but out of insulin "for a long time."

## 2017-11-17 NOTE — ED Provider Notes (Signed)
Emergency Department Provider Note   I have reviewed the triage vital signs and the nursing notes.   HISTORY  Chief Complaint Vaginal Bleeding   HPI Renee Bryant is a 36 y.o. female with PMH of HIV, DM, COPD since to the emergency department for evaluation of intermittent pelvic pain for the past 3 weeks with associated vaginal spotting.  Patient states she had a normal period at the start of symptoms but had intermittent irregular bleeding and has had consistent spotting.  She describes pain in the lower abdomen and pelvis worse on the right.  She has noticed some occasional foul-smelling vaginal discharge.  She is sexually active.  She is also developed a diffuse headache over the past several days.  She feels that her vision is slightly blurry.  Denies any weakness or numbness in the arms or legs.  No speech disturbance or difficulty swallowing.  No fevers or chills.   Past Medical History:  Diagnosis Date  . Abscess   . COPD (chronic obstructive pulmonary disease) ([employer])   . Diabetes mellitus without complication ([place])   . HIV (human immunodeficiency virus infection) ([place])     There are no active problems to display for this patient.   Past Surgical History:  Procedure Laterality Date  . CESAREAN SECTION    . CHOLECYSTECTOMY    . TUBAL LIGATION      Current Outpatient Rx  . Order #: [id number] Class: Print  . Order #: [id number] Class: Historical Med  . Order #: [id number] Class: Historical Med  . Order #: [id number] Class: Historical Med  . Order #: [id number] Class: Print  . Order #: [id number] Class: Historical Med  . Order #: [id number] Class: Print  . Order #: [id number] Class: Print  . Order #: [id number] Class: Print  . Order #: [id number] Class: Print  . Order #: [id number] Class: Print    Allergies Wellbutrin [bupropion]  History reviewed. No pertinent family history.  Social History Social History   Tobacco Use  . Smoking status: Current Some Day Smoker   Packs/day: 1.00    Types: Cigarettes  . Smokeless tobacco: Never Used  Substance Use Topics  . Alcohol use: No    Frequency: Never  . Drug use: No    Review of Systems  Constitutional: No fever/chills Eyes: Positive blurry vision.  ENT: No sore throat. Cardiovascular: Denies chest pain. Respiratory: Denies shortness of breath. Gastrointestinal: Positive lower abdominal pain.  No nausea, no vomiting.  No diarrhea.  No constipation. Genitourinary: Negative for dysuria. Positive vaginal bleeding.  Musculoskeletal: Negative for back pain. Skin: Negative for rash. Neurological: Negative for focal weakness or numbness. Positive HA.   10-point ROS otherwise negative.  ____________________________________________   PHYSICAL EXAM:  VITAL SIGNS: Temp: 98.6 F BP: 135/79 Pulse: 95 SpO2: 100 % RA  Constitutional: Alert and oriented. Well appearing and in no acute distress. Eyes: Conjunctivae are normal.  Head: Atraumatic. Nose: No congestion/rhinnorhea. Mouth/Throat: Mucous membranes are moist.  Oropharynx non-erythematous. Neck: No stridor.  Cardiovascular: Normal rate, regular rhythm. Good peripheral circulation. Grossly normal heart sounds.   Respiratory: Normal respiratory effort.  No retractions. Lungs CTAB. Gastrointestinal: Soft and nontender. No distention.  Genitourinary: Dark red blood in the vaginal vault and coming from the cervix. No cervical lesions. No evidence of external abscess.  Musculoskeletal: No lower extremity tenderness nor edema. No gross deformities of extremities. Neurologic:  Normal speech and language. No gross focal neurologic deficits are appreciated.  Skin:  Skin is warm, dry and intact. No rash noted.  ____________________________________________   LABS (all  labs ordered are listed, but only abnormal results are displayed)  Labs Reviewed  URINALYSIS, ROUTINE W REFLEX MICROSCOPIC - Abnormal; Notable for the following components:      Result  Value   Glucose, UA >=500 (*)    Hgb urine dipstick LARGE (*)    All other components within normal limits  COMPREHENSIVE METABOLIC PANEL - Abnormal; Notable for the following components:   Sodium 134 (*)    Glucose, Bld 412 (*)    Calcium 8.8 (*)    Albumin 3.2 (*)    All other components within normal limits  URINALYSIS, MICROSCOPIC (REFLEX) - Abnormal; Notable for the following components:   Bacteria, UA FEW (*)    Squamous Epithelial / LPF 0-5 (*)    All other components within normal limits  CBG MONITORING, ED - Abnormal; Notable for the following components:   Glucose-Capillary 431 (*)    All other components within normal limits  WET PREP, GENITAL  PREGNANCY, URINE  LIPASE, BLOOD  CBC WITH DIFFERENTIAL/PLATELET  GC/CHLAMYDIA PROBE AMP ([place]) NOT AT [facility]   ____________________________________________  RADIOLOGY  None ____________________________________________   PROCEDURES  Procedure(s) performed:   Procedures  None ____________________________________________   INITIAL IMPRESSION / ASSESSMENT AND PLAN / ED COURSE  Pertinent labs & imaging results that were available during my care of the patient were reviewed by me and considered in my medical decision making (see chart for details).  Patient presents to the emergency department with 3 weeks of vaginal spotting with abdominal/pelvic discomfort.  Patient is newly developed a headache.  She has history of a tubal ligation.  Plan for pelvic exam.  Patient's abdomen is completely soft and nontender to palpation throughout. Very low suspicion for ectopic pregnancy, TOA, or PID. Will confirm on pelvic.   Labs are unremarkable. Provided information on [facility] to assist with affording medication and insulin. Starting Sprintec 3 pills x 1 week for vaginal bleeding. Patient will follow with OB/Gyn in the coming weeks. No indication for imaging of the abdomen with no focal tenderness on either  abdominal exam or bimanual exam.   At this time, I do not feel there is any life-threatening condition present. I have reviewed and discussed all results (EKG, imaging, lab, urine as appropriate), exam findings with patient. I have reviewed nursing notes and appropriate previous records.  I feel the patient is safe to be discharged home without further emergent workup. Discussed usual and customary return precautions. Patient and family (if present) verbalize understanding and are comfortable with this plan.  Patient will follow-up with their primary care provider. If they do not have a primary care provider, information for follow-up has been provided to them. All questions have been answered.  ____________________________________________  FINAL CLINICAL IMPRESSION(S) / ED DIAGNOSES  Final diagnoses:  Vaginal bleeding  Lower abdominal pain  Hyperglycemia     MEDICATIONS GIVEN DURING THIS VISIT:  Medications  ketorolac (TORADOL) 30 MG/ML injection 30 mg (not administered)     NEW OUTPATIENT MEDICATIONS STARTED DURING THIS VISIT:  Tramadol, Motrin, and Sprintec    Note:  This document was prepared using Dragon voice recognition software and may include unintentional dictation errors.  Nanda Quinton, MD Emergency Medicine    Gearldene Fiorenza, [facility], MD 11/17/17 [phone]

## 2017-11-20 LAB — GC/CHLAMYDIA PROBE AMP (~~LOC~~) NOT AT ARMC
CHLAMYDIA, DNA PROBE: NEGATIVE
NEISSERIA GONORRHEA: NEGATIVE

## 2017-11-24 ENCOUNTER — Other Ambulatory Visit: Payer: Self-pay

## 2017-11-24 ENCOUNTER — Emergency Department (HOSPITAL_BASED_OUTPATIENT_CLINIC_OR_DEPARTMENT_OTHER): Payer: Self-pay

## 2017-11-24 ENCOUNTER — Encounter (HOSPITAL_BASED_OUTPATIENT_CLINIC_OR_DEPARTMENT_OTHER): Payer: Self-pay | Admitting: Emergency Medicine

## 2017-11-24 ENCOUNTER — Emergency Department (HOSPITAL_BASED_OUTPATIENT_CLINIC_OR_DEPARTMENT_OTHER)
Admission: EM | Admit: 2017-11-24 | Discharge: 2017-11-24 | Disposition: A | Discharge status: Home / Self Care | Payer: Self-pay | Attending: Emergency Medicine | Admitting: Emergency Medicine

## 2017-11-24 DIAGNOSIS — R062 Wheezing: Secondary | ICD-10-CM | POA: Insufficient documentation

## 2017-11-24 DIAGNOSIS — E119 Type 2 diabetes mellitus without complications: Secondary | ICD-10-CM | POA: Insufficient documentation

## 2017-11-24 DIAGNOSIS — F17210 Nicotine dependence, cigarettes, uncomplicated: Secondary | ICD-10-CM | POA: Insufficient documentation

## 2017-11-24 DIAGNOSIS — J449 Chronic obstructive pulmonary disease, unspecified: Secondary | ICD-10-CM | POA: Insufficient documentation

## 2017-11-24 DIAGNOSIS — N750 Cyst of Bartholin's gland: Secondary | ICD-10-CM | POA: Insufficient documentation

## 2017-11-24 DIAGNOSIS — Z23 Encounter for immunization: Secondary | ICD-10-CM | POA: Insufficient documentation

## 2017-11-24 LAB — COMPREHENSIVE METABOLIC PANEL
ALBUMIN: 3.5 g/dL (ref 3.5–5.0)
ALK PHOS: 72 U/L (ref 38–126)
ALT: 22 U/L (ref 14–54)
AST: 27 U/L (ref 15–41)
Anion gap: 9 (ref 5–15)
BILIRUBIN TOTAL: 0.6 mg/dL (ref 0.3–1.2)
BUN: 9 mg/dL (ref 6–20)
CALCIUM: 8.9 mg/dL (ref 8.9–10.3)
CO2: 25 mmol/L (ref 22–32)
CREATININE: 0.59 mg/dL (ref 0.44–1.00)
Chloride: 98 mmol/L — ABNORMAL LOW (ref 101–111)
GFR calc Af Amer: >60 mL/min (ref >60)
GFR calc non Af Amer: >60 mL/min (ref >60)
GLUCOSE: 356 mg/dL — AB (ref 65–99)
Potassium: 4 mmol/L (ref 3.5–5.1)
SODIUM: 132 mmol/L — AB (ref 135–145)
TOTAL PROTEIN: 8.4 g/dL — AB (ref 6.5–8.1)

## 2017-11-24 LAB — CBC WITH DIFFERENTIAL/PLATELET
Basophils Absolute: 0 10*3/uL (ref 0.0–0.1)
Basophils Relative: 0 %
EOS ABS: 0 10*3/uL (ref 0.0–0.7)
EOS PCT: 0 %
HCT: 41.9 % (ref 36.0–46.0)
Hemoglobin: 14.7 g/dL (ref 12.0–15.0)
LYMPHS ABS: 2.8 10*3/uL (ref 0.7–4.0)
LYMPHS PCT: 27 %
MCH: 30.1 pg (ref 26.0–34.0)
MCHC: 35.1 g/dL (ref 30.0–36.0)
MCV: 85.7 fL (ref 78.0–100.0)
MONO ABS: 1 10*3/uL (ref 0.1–1.0)
MONOS PCT: 9 %
Neutro Abs: 6.4 10*3/uL (ref 1.7–7.7)
Neutrophils Relative %: 64 %
PLATELETS: 172 10*3/uL (ref 150–400)
RBC: 4.89 MIL/uL (ref 3.87–5.11)
RDW: 12.1 % (ref 11.5–15.5)
WBC: 10.2 10*3/uL (ref 4.0–10.5)

## 2017-11-24 LAB — URINALYSIS, ROUTINE W REFLEX MICROSCOPIC
BILIRUBIN URINE: NEGATIVE
Ketones, ur: NEGATIVE mg/dL
Leukocytes, UA: NEGATIVE
Nitrite: NEGATIVE
PROTEIN: 100 mg/dL — AB
Specific Gravity, Urine: 1.02 (ref 1.005–1.030)
pH: 6 (ref 5.0–8.0)

## 2017-11-24 LAB — I-STAT CG4 LACTIC ACID, ED
Lactic Acid, Venous: 2.08 mmol/L (ref 0.5–1.9)
Lactic Acid, Venous: 1.01 mmol/L (ref 0.5–1.9)

## 2017-11-24 LAB — URINALYSIS, MICROSCOPIC (REFLEX)

## 2017-11-24 LAB — PREGNANCY, URINE: Preg Test, Ur: NEGATIVE

## 2017-11-24 MED ORDER — ALBUTEROL SULFATE (2.5 MG/3ML) 0.083% IN NEBU
5.0000 mg | INHALATION_SOLUTION | Freq: Once | RESPIRATORY_TRACT | Status: AC
  Administered 2017-11-24: 5 mg via RESPIRATORY_TRACT
  Filled 2017-11-24: qty 6

## 2017-11-24 MED ORDER — LIDOCAINE-EPINEPHRINE (PF) 2 %-1:200000 IJ SOLN: 20.0000 mL | Freq: Once | INTRAMUSCULAR | Status: DC

## 2017-11-24 MED ORDER — TETANUS-DIPHTH-ACELL PERTUSSIS 5-2.5-18.5 LF-MCG/0.5 IM SUSP
0.5000 mL | Freq: Once | INTRAMUSCULAR | Status: AC
  Administered 2017-11-24: 0.5 mL via INTRAMUSCULAR
  Filled 2017-11-24: qty 0.5

## 2017-11-24 MED ORDER — IPRATROPIUM BROMIDE 0.02 % IN SOLN
0.5000 mg | Freq: Once | RESPIRATORY_TRACT | Status: AC
  Administered 2017-11-24: 0.5 mg via RESPIRATORY_TRACT
  Filled 2017-11-24: qty 2.5

## 2017-11-24 MED ORDER — KETOROLAC TROMETHAMINE 30 MG/ML IJ SOLN
15.0000 mg | Freq: Once | INTRAMUSCULAR | Status: AC
  Administered 2017-11-24: 15 mg via INTRAVENOUS
  Filled 2017-11-24: qty 1

## 2017-11-24 MED ORDER — SODIUM CHLORIDE 0.9 % IV BOLUS (SEPSIS)
1000.0000 mL | Freq: Once | INTRAVENOUS | Status: AC
  Administered 2017-11-24: 1000 mL via INTRAVENOUS

## 2017-11-24 NOTE — ED Notes (Signed)
Date and time results received: 11/24/17 1229  (use smartphrase ".now" to insert current time)  Test: lactic acid Critical Value: 2.08  Name of Provider Notified: Margaretmary Lombard, PA  Orders Received? Or Actions Taken?: Orders Received - See Orders for details

## 2017-11-24 NOTE — Discharge Instructions (Signed)
As discussed, please make sure you follow-up with the women's outpatient clinic.  You may try applying warm compress for comfort.  Ibuprofen for pain.  Follow up with your primary care provider. Return sooner if you experience worsening symptoms or new concerning symptoms in the meantime.

## 2017-11-24 NOTE — ED Triage Notes (Signed)
"   I have a abscess on my labia " x 1 week. Just finished a week's course of Bactrim

## 2017-11-24 NOTE — ED Provider Notes (Signed)
[facility] Provider Note   CSN: [id number] Arrival date & time: 11/24/17  1000     History   Chief Complaint Chief Complaint  Patient presents with  . Abscess    HPI Renee Bryant is a 36 y.o. female with past medical history significant for HIV, COPD, diabetes on insulin but noncompliant with medications presenting with 1 week of right-sided labial abscess and pain.  She reports that she has history of the same in the past and having to be admitted once for surgical removal in [place] approximately 4 years ago.  She reports hot and cold sensations, subjective fever, cough for a week, congestion, ear clogging with decreased hearing which started about a week ago as well.  She reports that she has been compliant with her HIV medications because she has a program to help her acquire her prescriptions.  Had a CD4 count in November which was normal.  She has called her primary care provider who started her on Bactrim and has completed a week's course.  She has tried hot baths and tramadol all with no relief.  She reports nausea from pain and 2 episodes of vomiting yesterday.  HPI  Past Medical History:  Diagnosis Date  . Abscess   . COPD (chronic obstructive pulmonary disease) ([place])   . Diabetes mellitus without complication ([place])   . HIV (human immunodeficiency virus infection) ([place])     There are no active problems to display for this patient.   Past Surgical History:  Procedure Laterality Date  . CESAREAN SECTION    . CHOLECYSTECTOMY    . TUBAL LIGATION      OB History    No data available       Home Medications    Prior to Admission medications   Medication Sig Start Date End Date Taking? Authorizing Provider  albuterol (PROVENTIL HFA;VENTOLIN HFA) 108 (90 Base) MCG/ACT inhaler Inhale 2 puffs into the lungs every 4 (four) hours as needed for wheezing or shortness of breath. 10/21/17   Fransico Meadow, PA-C  azithromycin (ZITHROMAX)  250 MG tablet Take 1 tablet (250 mg total) by mouth daily. Take first 2 tablets together, then 1 every day until finished. 10/21/17   Fransico Meadow, PA-C  clonazePAM (KLONOPIN) 0.5 MG tablet Take 0.5 mg by mouth 2 (two) times daily.    [provider]  Elviteg-Cobic-Emtricit-TenofAF (GENVOYA PO) Take by mouth.    [provider]  GABAPENTIN PO Take by mouth.    [provider]  ibuprofen (ADVIL,MOTRIN) 800 MG tablet Take 1 tablet (800 mg total) by mouth every 8 (eight) hours as needed. 11/17/17   Long, Wonda Olds, MD  naproxen (NAPROSYN) 500 MG tablet Take 1 tablet (500 mg total) by mouth 2 (two) times daily. 12/05/16   Carlisle Cater, PA-C  norgestimate-ethinyl estradiol (SPRINTEC 28) 0.25-35 MG-MCG tablet Take 1 tablet by mouth daily. 11/17/17   Long, Wonda Olds, MD  omeprazole (PRILOSEC) 20 MG capsule Take 20 mg by mouth daily.    [provider]  pseudoephedrine (SUDAFED) 60 MG tablet Take 1 tablet (60 mg total) by mouth every 4 (four) hours as needed for congestion. 12/05/16   Carlisle Cater, PA-C  traMADol (ULTRAM) 50 MG tablet Take 1 tablet (50 mg total) by mouth every 6 (six) hours as needed. 11/17/17   Long, Wonda Olds, MD    Family History No family history on file.  Social History Social History   Tobacco Use  . Smoking  status: Current Some Day Smoker    Packs/day: 1.00    Types: Cigarettes  . Smokeless tobacco: Never Used  Substance Use Topics  . Alcohol use: No    Frequency: Never  . Drug use: No     Allergies   Wellbutrin [bupropion]   Review of Systems Review of Systems  Constitutional: Positive for chills and fever.       Subjective fevers with feeling hot and cold intermittently  HENT: Positive for congestion and hearing loss.   Eyes: Negative for photophobia.  Respiratory: Positive for cough and shortness of breath. Negative for choking, chest tightness, wheezing and stridor.   Cardiovascular: Negative for chest pain and  palpitations.  Gastrointestinal: Positive for nausea and vomiting.  Genitourinary: Negative for difficulty urinating, dysuria, flank pain, hematuria and pelvic pain.       Right labia pain  Musculoskeletal: Negative for myalgias, neck pain and neck stiffness.  Skin: Negative for color change, pallor and rash.  Neurological: Negative for dizziness, light-headedness and headaches.     Physical Exam Updated Vital Signs BP 119/75 (BP Location: Left Arm)   Pulse 84   Temp 98.1 F (36.7 C) (Oral)   Resp 18   Ht 5\' 7"  (1.702 m)   Wt 72.6 kg (160 lb)   LMP 11/10/2017   SpO2 99%   BMI 25.06 kg/m   Physical Exam  Constitutional: She appears well-developed and well-nourished. No distress.  Afebrile, nontoxic-appearing, lying comfortably in bed no acute distress.  HENT:  Head: Normocephalic and atraumatic.  Eyes: Conjunctivae and EOM are normal. Right eye exhibits no discharge. Left eye exhibits no discharge.  Neck: Normal range of motion. Neck supple.  Cardiovascular: Normal rate, regular rhythm and normal heart sounds.  No murmur heard. Pulmonary/Chest: Effort normal. No stridor. No respiratory distress. She has wheezes.  Abdominal: Soft. She exhibits no distension.  Genitourinary:     Musculoskeletal: Normal range of motion. She exhibits no edema.  Neurological: She is alert.  Skin: Skin is warm and dry. No rash noted. She is not diaphoretic. No erythema. No pallor.  Psychiatric: She has a normal mood and affect.  Nursing note and vitals reviewed.    ED Treatments / Results  Labs (all labs ordered are listed, but only abnormal results are displayed) Labs Reviewed  COMPREHENSIVE METABOLIC PANEL - Abnormal; Notable for the following components:      Result Value   Sodium 132 (*)    Chloride 98 (*)    Glucose, Bld 356 (*)    Total Protein 8.4 (*)    All other components within normal limits  URINALYSIS, ROUTINE W REFLEX MICROSCOPIC - Abnormal; Notable for the following  components:   APPearance CLOUDY (*)    Glucose, UA >=500 (*)    Hgb urine dipstick TRACE (*)    Protein, ur 100 (*)    All other components within normal limits  URINALYSIS, MICROSCOPIC (REFLEX) - Abnormal; Notable for the following components:   Bacteria, UA MANY (*)    Squamous Epithelial / LPF 6-30 (*)    All other components within normal limits  I-STAT CG4 LACTIC ACID, ED - Abnormal; Notable for the following components:   Lactic Acid, Venous 2.08 (*)    All other components within normal limits  CBC WITH DIFFERENTIAL/PLATELET  PREGNANCY, URINE  I-STAT CG4 LACTIC ACID, ED    EKG  EKG Interpretation None       Radiology Dg Chest 2 View  Result Date: 11/24/2017 CLINICAL DATA:  Cough for 1 week EXAM: CHEST  2 VIEW COMPARISON:  10/21/2017 chest radiograph. FINDINGS: Stable cardiomediastinal silhouette with normal heart size. No pneumothorax. No pleural effusion. Lungs appear clear, with no acute consolidative airspace disease and no pulmonary edema. IMPRESSION: No active cardiopulmonary disease. Electronically Signed   By: Ilona Sorrel M.D.   On: 11/24/2017 12:56    Procedures Procedures (including critical care time)    Medications Ordered in ED Medications  lidocaine-EPINEPHrine (XYLOCAINE W/EPI) 2 %-1:200000 (PF) injection 20 mL (20 mLs Infiltration Not Given 11/24/17 1433)  sodium chloride 0.9 % bolus 1,000 mL (0 mLs Intravenous Stopped 11/24/17 1408)  albuterol (PROVENTIL) (2.5 MG/3ML) 0.083% nebulizer solution 5 mg (5 mg Nebulization Given 11/24/17 1253)  ipratropium (ATROVENT) nebulizer solution 0.5 mg (0.5 mg Nebulization Given 11/24/17 1253)  Tdap (BOOSTRIX) injection 0.5 mL (0.5 mLs Intramuscular Given 11/24/17 1311)  ketorolac (TORADOL) 30 MG/ML injection 15 mg (15 mg Intravenous Given 11/24/17 1401)     Initial Impression / Assessment and Plan / ED Course  I have reviewed the triage vital signs and the nursing notes.  Pertinent labs & imaging results  that were available during my care of the patient were reviewed by me and considered in my medical decision making (see chart for details).     No obvious abscess amenable to I&D Patient had to leave before I could perform U/S to further evaluate labia for deeper abscess. Possibly bartholin's cyst.  Patient was provided with referral to women's outpatient clinic and case management consulted to help with medications.  Patient was otherwise well-appearing, nontoxic afebrile.  No leukocytosis.  Initial lactate mildly elevated, resolved after fluids. Pain was managed while in the emergency department.  Elevated blood glucose, patient is noncompliant with medications due to financial reasons.  No DKA.  She was given a liter IV fluids. Provided resources for primary care management and for low cost medications.  Discharge home with close follow-up with women's clinic and PCP.  Patient was well-appearing, normal vital signs and stable prior to discharge.  Discussed strict return precautions and advised to return to the emergency department if experiencing any new or worsening symptoms. Instructions were understood and patient agreed with discharge plan. Final Clinical Impressions(s) / ED Diagnoses   Final diagnoses:  Cyst of right Bartholin's gland    ED Discharge Orders        Ordered    Consult to care management    Comments:  Assistance with insulin. Non-compliance due to financial reasons  Provider:  (Not yet assigned)   11/24/17 1442       Emeline General, PA-C 11/24/17 1722    Long, Wonda Olds, MD 11/25/17 [phone]

## 2018-01-29 ENCOUNTER — Encounter (HOSPITAL_BASED_OUTPATIENT_CLINIC_OR_DEPARTMENT_OTHER): Payer: Self-pay

## 2018-01-29 ENCOUNTER — Other Ambulatory Visit: Payer: Self-pay

## 2018-01-29 ENCOUNTER — Emergency Department (HOSPITAL_BASED_OUTPATIENT_CLINIC_OR_DEPARTMENT_OTHER): Payer: Self-pay

## 2018-01-29 DIAGNOSIS — R05 Cough: Secondary | ICD-10-CM | POA: Insufficient documentation

## 2018-01-29 DIAGNOSIS — Z79899 Other long term (current) drug therapy: Secondary | ICD-10-CM | POA: Insufficient documentation

## 2018-01-29 DIAGNOSIS — F17210 Nicotine dependence, cigarettes, uncomplicated: Secondary | ICD-10-CM | POA: Insufficient documentation

## 2018-01-29 DIAGNOSIS — R0989 Other specified symptoms and signs involving the circulatory and respiratory systems: Secondary | ICD-10-CM | POA: Insufficient documentation

## 2018-01-29 DIAGNOSIS — J449 Chronic obstructive pulmonary disease, unspecified: Secondary | ICD-10-CM | POA: Insufficient documentation

## 2018-01-29 DIAGNOSIS — R509 Fever, unspecified: Secondary | ICD-10-CM | POA: Insufficient documentation

## 2018-01-29 DIAGNOSIS — B20 Human immunodeficiency virus [HIV] disease: Secondary | ICD-10-CM | POA: Insufficient documentation

## 2018-01-29 DIAGNOSIS — R0602 Shortness of breath: Secondary | ICD-10-CM | POA: Insufficient documentation

## 2018-01-29 DIAGNOSIS — E119 Type 2 diabetes mellitus without complications: Secondary | ICD-10-CM | POA: Insufficient documentation

## 2018-01-29 MED ORDER — ACETAMINOPHEN 325 MG PO TABS
650.0000 mg | ORAL_TABLET | Freq: Once | ORAL | Status: AC
  Administered 2018-01-29: 650 mg via ORAL
  Filled 2018-01-29: qty 2

## 2018-01-29 NOTE — ED Triage Notes (Signed)
Cough and congestion for the last few days with subjective SOB, pt is a heavy smoker, took robitussin at 1800, has not had anything else for symptoms

## 2018-01-30 ENCOUNTER — Emergency Department (HOSPITAL_BASED_OUTPATIENT_CLINIC_OR_DEPARTMENT_OTHER)
Admission: EM | Admit: 2018-01-30 | Discharge: 2018-01-30 | Disposition: A | Discharge status: Home / Self Care | Payer: Self-pay | Attending: Emergency Medicine | Admitting: Emergency Medicine

## 2018-01-30 DIAGNOSIS — R6889 Other general symptoms and signs: Secondary | ICD-10-CM

## 2018-01-30 HISTORY — DX: Unspecified asthma, uncomplicated: J45.909

## 2018-01-30 MED ORDER — PREDNISONE 20 MG PO TABS: 60.0000 mg | ORAL_TABLET | Freq: Every day | ORAL | 0 refills | Status: DC

## 2018-01-30 MED ORDER — ALBUTEROL SULFATE HFA 108 (90 BASE) MCG/ACT IN AERS
2.0000 | INHALATION_SPRAY | RESPIRATORY_TRACT | 0 refills | Status: DC | PRN
Start: 2018-01-30 — End: 2019-02-03

## 2018-01-30 NOTE — ED Notes (Signed)
Pt verbalizes understanding of d/c instructions and denies any further needs at this time. 

## 2018-01-30 NOTE — ED Notes (Signed)
Pt rhonchis, Pt took 2 puffs of home albuterol inhaler at 0110.

## 2018-01-30 NOTE — ED Provider Notes (Signed)
TIME SEEN: 1:59 AM  CHIEF COMPLAINT: Fever, cough, congestion  HPI: Patient is a 37 year old female with history of COPD, diabetes, HIV who presents emergency department with subjective fevers, dry cough, wheezing, shortness of breath, chest congestion.  Symptoms ongoing for several days.  No vomiting or diarrhea.  Did not have an influenza vaccination this year.  No recent travel or sick contacts.  No abdominal pain.  No dysuria or hematuria.  ROS: See HPI Constitutional:  fever  Eyes: no drainage  ENT: no runny nose   Cardiovascular:  no chest pain  Resp: SOB  GI: no vomiting GU: no dysuria Integumentary: no rash  Allergy: no hives  Musculoskeletal: no leg swelling  Neurological: no slurred speech ROS otherwise negative  PAST MEDICAL HISTORY/PAST SURGICAL HISTORY:  Past Medical History:  Diagnosis Date  . Abscess   . Asthma   . COPD (chronic obstructive pulmonary disease) ([place])   . Diabetes mellitus without complication ([place])   . HIV (human immunodeficiency virus infection) ([place])     MEDICATIONS:  Prior to Admission medications   Medication Sig Start Date End Date Taking? Authorizing Provider  albuterol (PROVENTIL HFA;VENTOLIN HFA) 108 (90 Base) MCG/ACT inhaler Inhale 2 puffs into the lungs every 4 (four) hours as needed for wheezing or shortness of breath. 10/21/17   Fransico Meadow, PA-C  azithromycin (ZITHROMAX) 250 MG tablet Take 1 tablet (250 mg total) by mouth daily. Take first 2 tablets together, then 1 every day until finished. 10/21/17   Fransico Meadow, PA-C  clonazePAM (KLONOPIN) 0.5 MG tablet Take 0.5 mg by mouth 2 (two) times daily.    [provider]  Elviteg-Cobic-Emtricit-TenofAF (GENVOYA PO) Take by mouth.    [provider]  GABAPENTIN PO Take by mouth.    [provider]  ibuprofen (ADVIL,MOTRIN) 800 MG tablet Take 1 tablet (800 mg total) by mouth every 8 (eight) hours as needed. 11/17/17   Long, Wonda Olds, MD  naproxen (NAPROSYN)  500 MG tablet Take 1 tablet (500 mg total) by mouth 2 (two) times daily. 12/05/16   Carlisle Cater, PA-C  norgestimate-ethinyl estradiol (SPRINTEC 28) 0.25-35 MG-MCG tablet Take 1 tablet by mouth daily. 11/17/17   Long, Wonda Olds, MD  omeprazole (PRILOSEC) 20 MG capsule Take 20 mg by mouth daily.    [provider]  pseudoephedrine (SUDAFED) 60 MG tablet Take 1 tablet (60 mg total) by mouth every 4 (four) hours as needed for congestion. 12/05/16   Carlisle Cater, PA-C  traMADol (ULTRAM) 50 MG tablet Take 1 tablet (50 mg total) by mouth every 6 (six) hours as needed. 11/17/17   Long, Wonda Olds, MD    ALLERGIES:  Allergies  Allergen Reactions  . Wellbutrin [Bupropion]     SOCIAL HISTORY:  Social History   Tobacco Use  . Smoking status: Current Some Day Smoker    Packs/day: 1.00    Types: Cigarettes  . Smokeless tobacco: Never Used  Substance Use Topics  . Alcohol use: No    Frequency: Never    FAMILY HISTORY: No family history on file.  EXAM: BP (!) 142/105 (BP Location: Left Arm)   Pulse 90   Temp 98.2 F (36.8 C) (Oral)   Resp 20   Ht 5\' 7"  (1.702 m)   Wt 68 kg (150 lb)   LMP 01/08/2018   SpO2 100%   BMI 23.49 kg/m  CONSTITUTIONAL: Alert and oriented and responds appropriately to questions. Well-appearing; well-nourished HEAD: Normocephalic EYES: Conjunctivae clear, pupils appear equal,  EOMI ENT: normal nose; moist mucous membranes NECK: Supple, no meningismus, no nuchal rigidity, no LAD  CARD: RRR; S1 and S2 appreciated; no murmurs, no clicks, no rubs, no gallops RESP: Normal chest excursion without splinting or tachypnea; breath sounds clear and equal bilaterally; no wheezes, no rhonchi, no rales, no hypoxia or respiratory distress, speaking full sentences ABD/GI: Normal bowel sounds; non-distended; soft, non-tender, no rebound, no guarding, no peritoneal signs, no hepatosplenomegaly BACK:  The back appears normal and is non-tender to palpation, there is no CVA  tenderness EXT: Normal ROM in all joints; non-tender to palpation; no edema; normal capillary refill; no cyanosis, no calf tenderness or swelling    SKIN: Normal color for age and race; warm; no rash NEURO: Moves all extremities equally PSYCH: The patient's mood and manner are appropriate. Grooming and personal hygiene are appropriate.  MEDICAL DECISION MAKING: Patient here with flulike symptoms.  Outside treatment window for Tamiflu.  Chest x-ray shows chronic bronchitis but no infiltrate or edema.  No signs of jaundice on exam.  Abdomen soft and nontender.  Appears well-hydrated and nontoxic.  Doubt sepsis.  Hemodynamically stable.  Will discharge with albuterol inhaler and prednisone for symptomatic relief.  Reports she has been wheezing at home but lungs are currently clear without hypoxia or increased work of breathing.  Given supportive care instructions.  I feel she is safe for discharge home.  At this time, I do not feel there is any life-threatening condition present. I have reviewed and discussed all results (EKG, imaging, lab, urine as appropriate) and exam findings with patient/family. I have reviewed nursing notes and appropriate previous records.  I feel the patient is safe to be discharged home without further emergent workup and can continue workup as an outpatient as needed. Discussed usual and customary return precautions. Patient/family verbalize understanding and are comfortable with this plan.  Outpatient follow-up has been provided if needed. All questions have been answered.      Linda Grimmer, Delice Bison, DO 01/30/18 [phone]

## 2018-01-30 NOTE — Discharge Instructions (Signed)
You may alternate Tylenol 1000 mg every 6 hours as needed for fever and pain and ibuprofen 800 mg every 8 hours as needed for fever and pain. Please rest and drink plenty of fluids. This is a viral illness causing your symptoms. You do not need antibiotics for a virus. You may use over-the-counter nasal saline spray and Afrin nasal saline spray as needed for nasal congestion. Please do not use Afrin for more than 3 days in a row. You may use Mucinex and Dextromethorphan as needed for cough.  You may use lozenges and Chloraseptic spray to help with sore throat.  Warm salt water gargles can also help with sore throat.  You may use over-the-counter Unisom (doxyalamine) or Benadryl to help with sleep.  Please note that some combination medicines such as DayQuil and NyQuil have multiple medications in them.  Please make sure you look at all labels to ensure that you are not taking too much of any one particular medication.  Symptoms from a virus may take 7-14 days to run its course. ° °We do not test for the flu from the emergency department as we do not have rapid flu swabs and it takes hours for this test to come back and it would not change our management. The flu is treated like any other virus with supportive measures as listed above. At this time you are outside the treatment window for Tamiflu. Tamiflu has to be taken within the first 48 hours of symptoms.  Tamiflu has many side effects including nausea, vomiting and diarrhea. ° °

## 2018-02-12 ENCOUNTER — Other Ambulatory Visit: Payer: Self-pay

## 2018-02-12 ENCOUNTER — Encounter (HOSPITAL_BASED_OUTPATIENT_CLINIC_OR_DEPARTMENT_OTHER): Payer: Self-pay

## 2018-02-12 ENCOUNTER — Emergency Department (HOSPITAL_BASED_OUTPATIENT_CLINIC_OR_DEPARTMENT_OTHER)
Admission: EM | Admit: 2018-02-12 | Discharge: 2018-02-12 | Disposition: A | Discharge status: Home / Self Care | Payer: Self-pay | Attending: Emergency Medicine | Admitting: Emergency Medicine

## 2018-02-12 DIAGNOSIS — Z5321 Procedure and treatment not carried out due to patient leaving prior to being seen by health care provider: Secondary | ICD-10-CM | POA: Insufficient documentation

## 2018-02-12 DIAGNOSIS — J189 Pneumonia, unspecified organism: Secondary | ICD-10-CM | POA: Insufficient documentation

## 2018-02-12 HISTORY — DX: Pneumonia, unspecified organism: J18.9

## 2018-02-12 NOTE — ED Triage Notes (Addendum)
Pt states she was dx with PNE at [facility] ED approx 2 weeks ago-states she feels no better-states she got one dose x 2 abxs at the ED but [facility] did not have rx abx that was to be called/faxed in by [facility]-pt did not contact [facility] ED for f/u-NAD-steady gait

## 2018-02-12 NOTE — ED Notes (Signed)
Call x 2 no answer

## 2018-02-12 NOTE — ED Notes (Signed)
No answer X's 1

## 2018-02-12 NOTE — ED Notes (Signed)
Call x 3 no answer

## 2018-02-14 NOTE — ED Notes (Signed)
02/14/2018, Attempted follow-up call. No answer.

## 2018-05-23 ENCOUNTER — Other Ambulatory Visit: Payer: Self-pay

## 2018-05-23 ENCOUNTER — Encounter (HOSPITAL_BASED_OUTPATIENT_CLINIC_OR_DEPARTMENT_OTHER): Payer: Self-pay | Admitting: Emergency Medicine

## 2018-05-23 ENCOUNTER — Emergency Department (HOSPITAL_BASED_OUTPATIENT_CLINIC_OR_DEPARTMENT_OTHER)
Admission: EM | Admit: 2018-05-23 | Discharge: 2018-05-23 | Disposition: A | Discharge status: Home / Self Care | Payer: Self-pay | Attending: Emergency Medicine | Admitting: Emergency Medicine

## 2018-05-23 DIAGNOSIS — G43909 Migraine, unspecified, not intractable, without status migrainosus: Secondary | ICD-10-CM | POA: Insufficient documentation

## 2018-05-23 DIAGNOSIS — E119 Type 2 diabetes mellitus without complications: Secondary | ICD-10-CM | POA: Insufficient documentation

## 2018-05-23 DIAGNOSIS — E860 Dehydration: Secondary | ICD-10-CM | POA: Insufficient documentation

## 2018-05-23 DIAGNOSIS — J449 Chronic obstructive pulmonary disease, unspecified: Secondary | ICD-10-CM | POA: Insufficient documentation

## 2018-05-23 DIAGNOSIS — R197 Diarrhea, unspecified: Secondary | ICD-10-CM

## 2018-05-23 DIAGNOSIS — J45909 Unspecified asthma, uncomplicated: Secondary | ICD-10-CM | POA: Insufficient documentation

## 2018-05-23 DIAGNOSIS — F17210 Nicotine dependence, cigarettes, uncomplicated: Secondary | ICD-10-CM | POA: Insufficient documentation

## 2018-05-23 DIAGNOSIS — G43009 Migraine without aura, not intractable, without status migrainosus: Secondary | ICD-10-CM

## 2018-05-23 DIAGNOSIS — Z21 Asymptomatic human immunodeficiency virus [HIV] infection status: Secondary | ICD-10-CM | POA: Insufficient documentation

## 2018-05-23 DIAGNOSIS — Z79899 Other long term (current) drug therapy: Secondary | ICD-10-CM | POA: Insufficient documentation

## 2018-05-23 LAB — CBC WITH DIFFERENTIAL/PLATELET
Basophils Absolute: 0 10*3/uL (ref 0.0–0.1)
Basophils Relative: 0 %
EOS ABS: 0.1 10*3/uL (ref 0.0–0.7)
EOS PCT: 2 %
HCT: 40.2 % (ref 36.0–46.0)
Hemoglobin: 14.5 g/dL (ref 12.0–15.0)
LYMPHS ABS: 1.9 10*3/uL (ref 0.7–4.0)
LYMPHS PCT: 27 %
MCH: 31.5 pg (ref 26.0–34.0)
MCHC: 36.1 g/dL — AB (ref 30.0–36.0)
MCV: 87.4 fL (ref 78.0–100.0)
MONO ABS: 0.6 10*3/uL (ref 0.1–1.0)
MONOS PCT: 9 %
Neutro Abs: 4.4 10*3/uL (ref 1.7–7.7)
Neutrophils Relative %: 62 %
PLATELETS: 189 10*3/uL (ref 150–400)
RBC: 4.6 MIL/uL (ref 3.87–5.11)
RDW: 12.2 % (ref 11.5–15.5)
WBC: 7 10*3/uL (ref 4.0–10.5)

## 2018-05-23 LAB — URINALYSIS, MICROSCOPIC (REFLEX): RBC / HPF: NONE SEEN RBC/hpf (ref 0–5)

## 2018-05-23 LAB — COMPREHENSIVE METABOLIC PANEL
ALT: 26 U/L (ref 0–44)
ANION GAP: 5 (ref 5–15)
AST: 19 U/L (ref 15–41)
Albumin: 3 g/dL — ABNORMAL LOW (ref 3.5–5.0)
Alkaline Phosphatase: 52 U/L (ref 38–126)
BUN: 17 mg/dL (ref 6–20)
CALCIUM: 8.4 mg/dL — AB (ref 8.9–10.3)
CHLORIDE: 103 mmol/L (ref 98–111)
CO2: 25 mmol/L (ref 22–32)
CREATININE: 0.38 mg/dL — AB (ref 0.44–1.00)
Glucose, Bld: 353 mg/dL — ABNORMAL HIGH (ref 70–99)
Potassium: 4 mmol/L (ref 3.5–5.1)
Sodium: 133 mmol/L — ABNORMAL LOW (ref 135–145)
Total Bilirubin: 0.3 mg/dL (ref 0.3–1.2)
Total Protein: 6.7 g/dL (ref 6.5–8.1)

## 2018-05-23 LAB — URINALYSIS, ROUTINE W REFLEX MICROSCOPIC
BILIRUBIN URINE: NEGATIVE
Glucose, UA: >=500 mg/dL — AB
HGB URINE DIPSTICK: NEGATIVE
KETONES UR: NEGATIVE mg/dL
Leukocytes, UA: NEGATIVE
NITRITE: NEGATIVE
PROTEIN: 30 mg/dL — AB
SPECIFIC GRAVITY, URINE: 1.015 (ref 1.005–1.030)
pH: 7 (ref 5.0–8.0)

## 2018-05-23 LAB — PREGNANCY, URINE: PREG TEST UR: NEGATIVE

## 2018-05-23 LAB — LIPASE, BLOOD: LIPASE: 43 U/L (ref 11–51)

## 2018-05-23 MED ORDER — SODIUM CHLORIDE 0.9 % IV BOLUS
1000.0000 mL | Freq: Once | INTRAVENOUS | Status: AC
  Administered 2018-05-23: 1000 mL via INTRAVENOUS

## 2018-05-23 MED ORDER — DIPHENHYDRAMINE HCL 50 MG/ML IJ SOLN
25.0000 mg | Freq: Once | INTRAMUSCULAR | Status: AC
  Administered 2018-05-23: 25 mg via INTRAVENOUS
  Filled 2018-05-23: qty 1

## 2018-05-23 MED ORDER — PROCHLORPERAZINE EDISYLATE 10 MG/2ML IJ SOLN
10.0000 mg | Freq: Once | INTRAMUSCULAR | Status: AC
  Administered 2018-05-23: 10 mg via INTRAVENOUS
  Filled 2018-05-23: qty 2

## 2018-05-23 NOTE — Discharge Instructions (Signed)
Your work-up today was overall reassuring.  We suspect you had a migraine headache which was improved with the medications were provided.  Your diarrhea does appear to have you somewhat dehydrated however your electrolytes and other labs were reassuring.  With the improvement in your symptoms, we feel you are safe for discharge home.  Please maintain hydration and follow-up with your primary doctor.  If any symptoms change or worsen, please return to the nearest emergency department.

## 2018-05-23 NOTE — ED Provider Notes (Signed)
[facility] EMERGENCY DEPARTMENT Provider Note   CSN: [id number] Arrival date & time: 05/23/18  0973     History   Chief Complaint Chief Complaint  Patient presents with  . Diarrhea    HPI Sol Odor is a 37 y.o. female.  The history is provided by the patient and medical records. No language interpreter was used.  Migraine  This is a new problem. The current episode started yesterday. The problem occurs constantly. The problem has not changed since onset.Associated symptoms include headaches. Pertinent negatives include no chest pain, no abdominal pain and no shortness of breath. The symptoms are aggravated by stress (bight light,loud noises). Nothing relieves the symptoms. She has tried nothing for the symptoms. The treatment provided no relief.  Diarrhea   This is a new problem. The current episode started 2 days ago. The problem occurs 2 to 4 times per day. The problem has not changed since onset.The stool consistency is described as watery. Associated symptoms include headaches. Pertinent negatives include no abdominal pain, no vomiting, no chills, no URI and no cough. She has tried nothing for the symptoms.    Past Medical History:  Diagnosis Date  . Abscess   . Asthma   . COPD (chronic obstructive pulmonary disease) ([place])   . Diabetes mellitus without complication ([place])   . HIV (human immunodeficiency virus infection) ([place])   . Pneumonia     There are no active problems to display for this patient.   Past Surgical History:  Procedure Laterality Date  . CESAREAN SECTION    . CHOLECYSTECTOMY    . TUBAL LIGATION       OB History   None      Home Medications    Prior to Admission medications   Medication Sig Start Date End Date Taking? Authorizing Provider  bictegravir-emtricitabine-tenofovir AF (BIKTARVY) 50-200-25 MG TABS tablet Take by mouth daily.   Yes [provider]  albuterol (PROVENTIL HFA;VENTOLIN HFA) 108 (90 Base) MCG/ACT  inhaler Inhale 2 puffs into the lungs every 4 (four) hours as needed for wheezing or shortness of breath. 10/21/17   Fransico Meadow, PA-C  albuterol (PROVENTIL HFA;VENTOLIN HFA) 108 (90 Base) MCG/ACT inhaler Inhale 2 puffs into the lungs every 4 (four) hours as needed for wheezing or shortness of breath. 01/30/18   Ward, Delice Bison, DO  azithromycin (ZITHROMAX) 250 MG tablet Take 1 tablet (250 mg total) by mouth daily. Take first 2 tablets together, then 1 every day until finished. 10/21/17   Fransico Meadow, PA-C  clonazePAM (KLONOPIN) 0.5 MG tablet Take 0.5 mg by mouth 2 (two) times daily.    [provider]  Elviteg-Cobic-Emtricit-TenofAF (GENVOYA PO) Take by mouth.    [provider]  GABAPENTIN PO Take by mouth.    [provider]  ibuprofen (ADVIL,MOTRIN) 800 MG tablet Take 1 tablet (800 mg total) by mouth every 8 (eight) hours as needed. 11/17/17   Long, Wonda Olds, MD  naproxen (NAPROSYN) 500 MG tablet Take 1 tablet (500 mg total) by mouth 2 (two) times daily. 12/05/16   Carlisle Cater, PA-C  norgestimate-ethinyl estradiol (SPRINTEC 28) 0.25-35 MG-MCG tablet Take 1 tablet by mouth daily. 11/17/17   Long, Wonda Olds, MD  omeprazole (PRILOSEC) 20 MG capsule Take 20 mg by mouth daily.    [provider]  predniSONE (DELTASONE) 20 MG tablet Take 3 tablets (60 mg total) by mouth daily. 01/30/18   Ward, Delice Bison, DO  pseudoephedrine (SUDAFED) 60 MG tablet Take  1 tablet (60 mg total) by mouth every 4 (four) hours as needed for congestion. 12/05/16   Carlisle Cater, PA-C  traMADol (ULTRAM) 50 MG tablet Take 1 tablet (50 mg total) by mouth every 6 (six) hours as needed. 11/17/17   Long, Wonda Olds, MD    Family History No family history on file.  Social History Social History   Tobacco Use  . Smoking status: Current Some Day Smoker    Packs/day: 1.00    Types: Cigarettes  . Smokeless tobacco: Never Used  Substance Use Topics  . Alcohol use: No    Frequency: Never    . Drug use: No     Allergies   Wellbutrin [bupropion]   Review of Systems Review of Systems  Constitutional: Positive for fatigue. Negative for chills, diaphoresis and fever.  HENT: Negative for congestion.   Respiratory: Negative for cough, chest tightness, shortness of breath and wheezing.   Cardiovascular: Negative for chest pain.  Gastrointestinal: Positive for diarrhea. Negative for abdominal pain, constipation, nausea and vomiting.  Genitourinary: Positive for frequency. Negative for difficulty urinating and flank pain.  Musculoskeletal: Negative for back pain, neck pain and neck stiffness.  Skin: Negative for rash and wound.  Neurological: Positive for headaches. Negative for dizziness, seizures, facial asymmetry, speech difficulty, weakness, light-headedness and numbness.  Psychiatric/Behavioral: Negative for agitation.  All other systems reviewed and are negative.    Physical Exam Updated Vital Signs BP (!) 154/104 (BP Location: Right Arm)   Pulse 80   Temp 98.6 F (37 C) (Oral)   Resp 16   Ht 5\' 7"  (1.702 m)   Wt 72.6 kg (160 lb)   LMP 05/02/2018   SpO2 100%   BMI 25.06 kg/m   Physical Exam  Constitutional: She is oriented to person, place, and time. She appears well-developed and well-nourished. No distress.  HENT:  Head: Normocephalic and atraumatic.  Right Ear: External ear normal.  Left Ear: External ear normal.  Nose: Nose normal.  Mouth/Throat: Oropharynx is clear and moist. No oropharyngeal exudate.  Eyes: Pupils are equal, round, and reactive to light. Conjunctivae and EOM are normal.  Neck: Normal range of motion. Neck supple.  Cardiovascular: Normal rate and intact distal pulses.  No murmur heard. Pulmonary/Chest: Effort normal. No stridor. No respiratory distress. She has no wheezes. She exhibits no tenderness.  Abdominal: She exhibits no distension. There is no tenderness. There is no rebound and no guarding.  Musculoskeletal: She exhibits  no edema or tenderness.  Neurological: She is alert and oriented to person, place, and time. She has normal reflexes. No cranial nerve deficit or sensory deficit. She exhibits normal muscle tone. Coordination normal.  Skin: Skin is warm. Capillary refill takes less than 2 seconds. No rash noted. She is not diaphoretic. No erythema.  Psychiatric: She has a normal mood and affect.  Nursing note and vitals reviewed.    ED Treatments / Results  Labs (all labs ordered are listed, but only abnormal results are displayed) Labs Reviewed  URINALYSIS, ROUTINE W REFLEX MICROSCOPIC - Abnormal; Notable for the following components:      Result Value   Color, Urine STRAW (*)    Glucose, UA >=500 (*)    Protein, ur 30 (*)    All other components within normal limits  URINALYSIS, MICROSCOPIC (REFLEX) - Abnormal; Notable for the following components:   Bacteria, UA MANY (*)    All other components within normal limits  CBC WITH DIFFERENTIAL/PLATELET - Abnormal; Notable for the following  components:   MCHC 36.1 (*)    All other components within normal limits  COMPREHENSIVE METABOLIC PANEL - Abnormal; Notable for the following components:   Sodium 133 (*)    Glucose, Bld 353 (*)    Creatinine, Ser 0.38 (*)    Calcium 8.4 (*)    Albumin 3.0 (*)    All other components within normal limits  PREGNANCY, URINE  LIPASE, BLOOD    EKG None  Radiology No results found.  Procedures Procedures (including critical care time)  Medications Ordered in ED Medications  sodium chloride 0.9 % bolus 1,000 mL (0 mLs Intravenous Stopped 05/23/18 1031)  prochlorperazine (COMPAZINE) injection 10 mg (10 mg Intravenous Given 05/23/18 0957)  diphenhydrAMINE (BENADRYL) injection 25 mg (25 mg Intravenous Given 05/23/18 0956)     Initial Impression / Assessment and Plan / ED Course  I have reviewed the triage vital signs and the nursing notes.  Pertinent labs & imaging results that were available during my care  of the patient were reviewed by me and considered in my medical decision making (see chart for details).     Renee Bryant is a 37 y.o. female past medical history significant for migraines, diabetes, hypertension, asthma and COPD who presents with diarrhea, urinary frequency, and headaches.  Patient reports that she has had diarrhea and urinary frequency for several days.  He reports she has not been drinking very much water.  She reports that she has had increase in stress.  She says that she developed headache since yesterday that she describes as migrainous and frontal.  It is moderate to severe.  Over-the-counter medications have not helped.  She reports that she is very stressed.  She denies any chest pain, shortness of breath, or difficulty breathing.  She denies constipation.  She denies any other symptoms.  On exam, patient's abdomen was nontender.  Lungs are clear.  Chest is nontender.  No focal neurologic deficits.  Patient had photophobia and phonophobia on exam.  Clinically I am concerned patient may be dehydrated from the urinary frequency, and diarrhea that likely exacerbated a migraine.  Given the patient's reassuring exam and her similarity to prior headaches with migraine, patient will be given a migraine cocktail she will provide her with Tamiflu patient given fluids and cocktail with complete resolution of headache.  Patient'zs laboratory testing was overall reassuring.  No nitrites or leukocytes.  Pregnancy test negative.  Given improvement in symptoms after medications, patient was felt stable for discharge home.  Patient will maintain hydration at home and follow-up with PCP.  Patient understood return precautions.  Patient had no other questions or concerns and patient was discharged in good condition.      Final Clinical Impressions(s) / ED Diagnoses   Final diagnoses:  Diarrhea, unspecified type  Dehydration  Migraine without aura and without status migrainosus, not  intractable    ED Discharge Orders    None     Clinical Impression: 1. Diarrhea, unspecified type   2. Dehydration   3. Migraine without aura and without status migrainosus, not intractable     Disposition: Discharge  Condition: Good  I have discussed the results, Dx and Tx plan with the pt(& family if present). He/she/they expressed understanding and agree(s) with the plan. Discharge instructions discussed at great length. Strict return precautions discussed and pt &/or family have verbalized understanding of the instructions. No further questions at time of discharge.    Discharge Medication List as of 05/23/2018  1:22 PM  Follow Up: Napoleon Form, NP [place] [id number] [phone]     [facility]GENCY DEPARTMENT [address] [id number] [address] [place] [phone]       Tegeler, Gwenyth Allegra, MD 05/23/18 2014

## 2018-05-23 NOTE — ED Triage Notes (Signed)
Urinary frequency x1 week.  Diarrhea and body aches since last night.

## 2018-06-09 ENCOUNTER — Encounter (HOSPITAL_BASED_OUTPATIENT_CLINIC_OR_DEPARTMENT_OTHER): Payer: Self-pay | Admitting: *Deleted

## 2018-06-09 ENCOUNTER — Other Ambulatory Visit: Payer: Self-pay

## 2018-06-09 DIAGNOSIS — Z5321 Procedure and treatment not carried out due to patient leaving prior to being seen by health care provider: Secondary | ICD-10-CM | POA: Insufficient documentation

## 2018-06-09 DIAGNOSIS — R197 Diarrhea, unspecified: Secondary | ICD-10-CM | POA: Insufficient documentation

## 2018-06-09 DIAGNOSIS — R1110 Vomiting, unspecified: Secondary | ICD-10-CM | POA: Insufficient documentation

## 2018-06-09 LAB — URINALYSIS, ROUTINE W REFLEX MICROSCOPIC
BILIRUBIN URINE: NEGATIVE
Glucose, UA: >=500 mg/dL — AB
Hgb urine dipstick: NEGATIVE
Ketones, ur: NEGATIVE mg/dL
Leukocytes, UA: NEGATIVE
NITRITE: NEGATIVE
PH: 5.5 (ref 5.0–8.0)
Protein, ur: 100 mg/dL — AB
SPECIFIC GRAVITY, URINE: 1.02 (ref 1.005–1.030)

## 2018-06-09 LAB — CBC
HEMATOCRIT: 42.6 % (ref 36.0–46.0)
Hemoglobin: 14.9 g/dL (ref 12.0–15.0)
MCH: 31.8 pg (ref 26.0–34.0)
MCHC: 36.9 g/dL — AB (ref 30.0–36.0)
MCV: 84.9 fL (ref 78.0–100.0)
Platelets: 189 10*3/uL (ref 150–400)
RBC: 5.02 MIL/uL (ref 3.87–5.11)
RDW: 12 % (ref 11.5–15.5)
WBC: 8.3 10*3/uL (ref 4.0–10.5)

## 2018-06-09 LAB — URINALYSIS, MICROSCOPIC (REFLEX): RBC / HPF: NONE SEEN RBC/hpf (ref 0–5)

## 2018-06-09 LAB — COMPREHENSIVE METABOLIC PANEL
ALBUMIN: 3.6 g/dL (ref 3.5–5.0)
ALT: 27 U/L (ref 0–44)
AST: 22 U/L (ref 15–41)
Alkaline Phosphatase: 54 U/L (ref 38–126)
Anion gap: 8 (ref 5–15)
BILIRUBIN TOTAL: 0.5 mg/dL (ref 0.3–1.2)
BUN: 15 mg/dL (ref 6–20)
CHLORIDE: 101 mmol/L (ref 98–111)
CO2: 23 mmol/L (ref 22–32)
Calcium: 8.9 mg/dL (ref 8.9–10.3)
Creatinine, Ser: 0.62 mg/dL (ref 0.44–1.00)
GFR calc Af Amer: >60 mL/min (ref >60)
GFR calc non Af Amer: >60 mL/min (ref >60)
GLUCOSE: 361 mg/dL — AB (ref 70–99)
POTASSIUM: 4.2 mmol/L (ref 3.5–5.1)
Sodium: 132 mmol/L — ABNORMAL LOW (ref 135–145)
TOTAL PROTEIN: 7.5 g/dL (ref 6.5–8.1)

## 2018-06-09 LAB — LIPASE, BLOOD: Lipase: 43 U/L (ref 11–51)

## 2018-06-09 LAB — PREGNANCY, URINE: PREG TEST UR: NEGATIVE

## 2018-06-09 NOTE — ED Triage Notes (Signed)
Diarrhea x 3 days. Vomiting today. No fever.

## 2018-06-10 ENCOUNTER — Emergency Department (HOSPITAL_BASED_OUTPATIENT_CLINIC_OR_DEPARTMENT_OTHER)
Admission: EM | Admit: 2018-06-10 | Discharge: 2018-06-10 | Disposition: A | Discharge status: Home / Self Care | Payer: Self-pay | Attending: Emergency Medicine | Admitting: Emergency Medicine

## 2018-06-10 NOTE — ED Notes (Signed)
Follow up call made  Pt talked to pcp   [phone]  1028  s Chasmine Lender rn

## 2018-06-10 NOTE — ED Notes (Signed)
Pt did not answer x 2. Security reports pt and pt family seen leaving facility

## 2018-06-10 NOTE — ED Notes (Signed)
Pt did not answer x 1 

## 2018-07-30 ENCOUNTER — Emergency Department (HOSPITAL_BASED_OUTPATIENT_CLINIC_OR_DEPARTMENT_OTHER): Payer: Self-pay

## 2018-07-30 ENCOUNTER — Encounter (HOSPITAL_BASED_OUTPATIENT_CLINIC_OR_DEPARTMENT_OTHER): Payer: Self-pay

## 2018-07-30 ENCOUNTER — Emergency Department (HOSPITAL_BASED_OUTPATIENT_CLINIC_OR_DEPARTMENT_OTHER)
Admission: EM | Admit: 2018-07-30 | Discharge: 2018-07-30 | Disposition: A | Discharge status: Home / Self Care | Payer: Self-pay | Attending: Emergency Medicine | Admitting: Emergency Medicine

## 2018-07-30 ENCOUNTER — Other Ambulatory Visit: Payer: Self-pay

## 2018-07-30 DIAGNOSIS — F17210 Nicotine dependence, cigarettes, uncomplicated: Secondary | ICD-10-CM | POA: Insufficient documentation

## 2018-07-30 DIAGNOSIS — J45909 Unspecified asthma, uncomplicated: Secondary | ICD-10-CM | POA: Insufficient documentation

## 2018-07-30 DIAGNOSIS — N939 Abnormal uterine and vaginal bleeding, unspecified: Secondary | ICD-10-CM | POA: Insufficient documentation

## 2018-07-30 DIAGNOSIS — Z79899 Other long term (current) drug therapy: Secondary | ICD-10-CM | POA: Insufficient documentation

## 2018-07-30 DIAGNOSIS — B20 Human immunodeficiency virus [HIV] disease: Secondary | ICD-10-CM | POA: Insufficient documentation

## 2018-07-30 DIAGNOSIS — D259 Leiomyoma of uterus, unspecified: Secondary | ICD-10-CM | POA: Insufficient documentation

## 2018-07-30 DIAGNOSIS — E119 Type 2 diabetes mellitus without complications: Secondary | ICD-10-CM | POA: Insufficient documentation

## 2018-07-30 DIAGNOSIS — Z9049 Acquired absence of other specified parts of digestive tract: Secondary | ICD-10-CM | POA: Insufficient documentation

## 2018-07-30 LAB — COMPREHENSIVE METABOLIC PANEL
ALT: 28 U/L (ref 0–44)
ANION GAP: 10 (ref 5–15)
AST: 30 U/L (ref 15–41)
Albumin: 3.2 g/dL — ABNORMAL LOW (ref 3.5–5.0)
Alkaline Phosphatase: 51 U/L (ref 38–126)
BILIRUBIN TOTAL: 0.5 mg/dL (ref 0.3–1.2)
BUN: 9 mg/dL (ref 6–20)
CHLORIDE: 98 mmol/L (ref 98–111)
CO2: 25 mmol/L (ref 22–32)
Calcium: 8.6 mg/dL — ABNORMAL LOW (ref 8.9–10.3)
Creatinine, Ser: 0.52 mg/dL (ref 0.44–1.00)
GFR calc Af Amer: >60 mL/min (ref >60)
Glucose, Bld: 391 mg/dL — ABNORMAL HIGH (ref 70–99)
POTASSIUM: 3.6 mmol/L (ref 3.5–5.1)
Sodium: 133 mmol/L — ABNORMAL LOW (ref 135–145)
TOTAL PROTEIN: 6.8 g/dL (ref 6.5–8.1)

## 2018-07-30 LAB — CBC
HEMATOCRIT: 41.9 % (ref 36.0–46.0)
HEMOGLOBIN: 15.1 g/dL — AB (ref 12.0–15.0)
MCH: 31.7 pg (ref 26.0–34.0)
MCHC: 36 g/dL (ref 30.0–36.0)
MCV: 88 fL (ref 78.0–100.0)
Platelets: 182 10*3/uL (ref 150–400)
RBC: 4.76 MIL/uL (ref 3.87–5.11)
RDW: 11.8 % (ref 11.5–15.5)
WBC: 7.7 10*3/uL (ref 4.0–10.5)

## 2018-07-30 LAB — URINALYSIS, ROUTINE W REFLEX MICROSCOPIC
BILIRUBIN URINE: NEGATIVE
Glucose, UA: >=500 mg/dL — AB
Ketones, ur: NEGATIVE mg/dL
LEUKOCYTES UA: NEGATIVE
Nitrite: NEGATIVE
PROTEIN: 100 mg/dL — AB
SPECIFIC GRAVITY, URINE: 1.02 (ref 1.005–1.030)
pH: 6 (ref 5.0–8.0)

## 2018-07-30 LAB — LIPASE, BLOOD: LIPASE: 37 U/L (ref 11–51)

## 2018-07-30 LAB — WET PREP, GENITAL
CLUE CELLS WET PREP: NONE SEEN
Sperm: NONE SEEN
TRICH WET PREP: NONE SEEN
YEAST WET PREP: NONE SEEN

## 2018-07-30 LAB — URINALYSIS, MICROSCOPIC (REFLEX)

## 2018-07-30 LAB — PREGNANCY, URINE: PREG TEST UR: NEGATIVE

## 2018-07-30 MED ORDER — TRAMADOL HCL 50 MG PO TABS: 50.0000 mg | ORAL_TABLET | Freq: Four times a day (QID) | ORAL | 0 refills | Status: DC | PRN

## 2018-07-30 MED ORDER — MELOXICAM 7.5 MG PO TABS: 15.0000 mg | ORAL_TABLET | Freq: Every day | ORAL | 0 refills | Status: DC

## 2018-07-30 MED ORDER — IBUPROFEN 800 MG PO TABS
800.0000 mg | ORAL_TABLET | Freq: Once | ORAL | Status: AC
  Administered 2018-07-30: 800 mg via ORAL
  Filled 2018-07-30: qty 1

## 2018-07-30 MED ORDER — ONDANSETRON HCL 4 MG/2ML IJ SOLN
4.0000 mg | Freq: Once | INTRAMUSCULAR | Status: AC
  Administered 2018-07-30: 4 mg via INTRAVENOUS
  Filled 2018-07-30: qty 2

## 2018-07-30 MED ORDER — HYDROCODONE-ACETAMINOPHEN 5-325 MG PO TABS
2.0000 | ORAL_TABLET | Freq: Once | ORAL | Status: AC
  Administered 2018-07-30: 2 via ORAL
  Filled 2018-07-30: qty 2

## 2018-07-30 NOTE — ED Provider Notes (Signed)
[facility] EMERGENCY DEPARTMENT Provider Note   CSN: [id number] Arrival date & time: 07/30/18  1614     History   Chief Complaint Chief Complaint  Patient presents with  . Abdominal Pain    HPI Renee Bryant is a 37 y.o. female who  has a past medical history of Abscess, Asthma, COPD (chronic obstructive pulmonary disease) ([place]), Diabetes mellitus without complication ([place]), HIV (human immunodeficiency virus infection) ([place]), and Pneumonia. Review of EMR shows that the Patient has had poor compliance with her Gabriel Rung and was recently switched to [place] at last ID visit and had a viral load >13,000.  The patient presents with complaint of left-sided pelvic pain.  Patient states that she had onset of her period early 3 weeks ago had 4 days of bleeding, the bleeding seemed to resolve however she began having spotting again 2 days later which was abnormal for her.  Over the past 3 weeks she has had progressively worsening vaginal bleeding but states that there is a foul odor to the blood in it appears brown and chocolatey, thick.  She states that she has never had bleeding like this before.  She has pain in her left pelvic area and states that that has progressively gotten worse now she has pain whenever she laughs, coughs or walks in the left lower quadrant of her abdomen.  She denies nausea vomiting.  She states that about a month ago she did have an episode of diarrhea that was only blood but she has not had any since.  Patient is also noticed some bumps around her rectum and she is concerned and would like them evaluated.  HPI  Past Medical History:  Diagnosis Date  . Abscess   . Asthma   . COPD (chronic obstructive pulmonary disease) ([place])   . Diabetes mellitus without complication ([place])   . HIV (human immunodeficiency virus infection) ([place])   . Pneumonia     There are no active problems to display for this patient.   Past Surgical History:  Procedure Laterality Date  .  CESAREAN SECTION    . CHOLECYSTECTOMY    . TUBAL LIGATION       OB History   None      Home Medications    Prior to Admission medications   Medication Sig Start Date End Date Taking? Authorizing Provider  albuterol (PROVENTIL HFA;VENTOLIN HFA) 108 (90 Base) MCG/ACT inhaler Inhale 2 puffs into the lungs every 4 (four) hours as needed for wheezing or shortness of breath. 10/21/17   Fransico Meadow, PA-C  albuterol (PROVENTIL HFA;VENTOLIN HFA) 108 (90 Base) MCG/ACT inhaler Inhale 2 puffs into the lungs every 4 (four) hours as needed for wheezing or shortness of breath. 01/30/18   Ward, Delice Bison, DO  azithromycin (ZITHROMAX) 250 MG tablet Take 1 tablet (250 mg total) by mouth daily. Take first 2 tablets together, then 1 every day until finished. 10/21/17   Fransico Meadow, PA-C  bictegravir-emtricitabine-tenofovir AF (BIKTARVY) 50-200-25 MG TABS tablet Take by mouth daily.    [provider]  clonazePAM (KLONOPIN) 0.5 MG tablet Take 0.5 mg by mouth 2 (two) times daily.    [provider]  Elviteg-Cobic-Emtricit-TenofAF (GENVOYA PO) Take by mouth.    [provider]  GABAPENTIN PO Take by mouth.    [provider]  ibuprofen (ADVIL,MOTRIN) 800 MG tablet Take 1 tablet (800 mg total) by mouth every 8 (eight) hours as needed. 11/17/17   Long, Wonda Olds, MD  naproxen (  NAPROSYN) 500 MG tablet Take 1 tablet (500 mg total) by mouth 2 (two) times daily. 12/05/16   Carlisle Cater, PA-C  norgestimate-ethinyl estradiol (SPRINTEC 28) 0.25-35 MG-MCG tablet Take 1 tablet by mouth daily. 11/17/17   Long, Wonda Olds, MD  omeprazole (PRILOSEC) 20 MG capsule Take 20 mg by mouth daily.    [provider]  predniSONE (DELTASONE) 20 MG tablet Take 3 tablets (60 mg total) by mouth daily. 01/30/18   Ward, Delice Bison, DO  pseudoephedrine (SUDAFED) 60 MG tablet Take 1 tablet (60 mg total) by mouth every 4 (four) hours as needed for congestion. 12/05/16   Carlisle Cater, PA-C    traMADol (ULTRAM) 50 MG tablet Take 1 tablet (50 mg total) by mouth every 6 (six) hours as needed. 11/17/17   Long, Wonda Olds, MD    Family History No family history on file.  Social History Social History   Tobacco Use  . Smoking status: Current Some Day Smoker    Packs/day: 1.00    Types: Cigarettes  . Smokeless tobacco: Never Used  Substance Use Topics  . Alcohol use: No    Frequency: Never  . Drug use: No     Allergies   Wellbutrin [bupropion]   Review of Systems Review of Systems  Ten systems reviewed and are negative for acute change, except as noted in the HPI.  Physical Exam Updated Vital Signs BP (!) 147/113 (BP Location: Left Arm)   Pulse 98   Temp 98.3 F (36.8 C) (Oral)   Resp 18   Ht 5\' 7"  (1.702 m)   Wt 81.4 kg   LMP 07/09/2018   SpO2 99%   BMI 28.11 kg/m   Physical Exam  Constitutional: She is oriented to person, place, and time. She appears well-developed and well-nourished. No distress.  HENT:  Head: Normocephalic and atraumatic.  Eyes: Conjunctivae are normal. No scleral icterus.  Neck: Normal range of motion.  Cardiovascular: Normal rate, regular rhythm and normal heart sounds. Exam reveals no gallop and no friction rub.  No murmur heard. Pulmonary/Chest: Effort normal and breath sounds normal. No respiratory distress.  Abdominal: Soft. Bowel sounds are normal. She exhibits no distension and no mass. There is no tenderness. There is no guarding.  Genitourinary:  Genitourinary Comments: Normal external female genitalia Vaginal walls appear pink and healthy without discharge.  There is copious, thick, mucoid and sticky brown discharge from the cervix.  No cervical motion tenderness, tenderness in the left adnexa without fullness on palpation.  Evaluation of the anal region shows multiple small verrucous eruptions around the anus.  Neurological: She is alert and oriented to person, place, and time.  Skin: Skin is warm and dry. She is not  diaphoretic.  Psychiatric: Her behavior is normal.  Nursing note and vitals reviewed.    ED Treatments / Results  Labs (all labs ordered are listed, but only abnormal results are displayed) Labs Reviewed  URINALYSIS, ROUTINE W REFLEX MICROSCOPIC - Abnormal; Notable for the following components:      Result Value   Glucose, UA >=500 (*)    Hgb urine dipstick LARGE (*)    Protein, ur 100 (*)    All other components within normal limits  PREGNANCY, URINE  LIPASE, BLOOD  COMPREHENSIVE METABOLIC PANEL  CBC  URINALYSIS, MICROSCOPIC (REFLEX)    EKG None  Radiology No results found.  Procedures Procedures (including critical care time)  Medications Ordered in ED Medications - No data to display   Initial Impression /  Assessment and Plan / ED Course  I have reviewed the triage vital signs and the nursing notes.  Pertinent labs & imaging results that were available during my care of the patient were reviewed by me and considered in my medical decision making (see chart for details).  Clinical Course as of Jul 30 2002  Wed Jul 30, 2018  1932 Glucose(!): 391 [AH]    Clinical Course User Index [AH] Margarita Mail, PA-C    Patient with vaginal bleeding.  She is not pregnant no evidence of ectopic pregnancy.  Patient has a history of tubal ligation.  Patient ultrasound does fairly benign.  She does have a large fibroid that seems to be smaller and may be necrotic and causing the bleeding she is having.  She does not appear to have any emergent cause of her symptoms today.  On examination patient also appears to have verrucous eruption around her rectum.  She will likely need to follow with her infectious disease doctor regarding this.  She has been given a referral to the [facility]'s outpatient clinic at [facility].  She appears appropriate for discharge at this time.  Discussed return precautions  Final Clinical Impressions(s) / ED Diagnoses   Final diagnoses:  None     ED Discharge Orders    None       Margarita Mail, PA-C 07/31/18 0007    Malvin Johns, MD 07/31/18 [phone]

## 2018-07-30 NOTE — Discharge Instructions (Addendum)
Contact a health care provider if: You get light-headed or weak. You have nausea and vomiting. You cannot eat or drink without vomiting. You feel dizzy or have diarrhea while you are taking medicines. You are taking birth control pills or hormones, and you want to change them or stop taking them. Get help right away if: You develop a fever or chills. You need to change your sanitary pad or tampon more than one time per hour. Your bleeding becomes heavier, or your flow contains clots more often. You develop pain in your abdomen. You lose consciousness. You develop a rash.

## 2018-07-30 NOTE — ED Triage Notes (Signed)
Pt c/o LLQ pain x 4-5 days-vaginal bleeding x 3 weeks-NAD-steady gait

## 2018-07-31 LAB — GC/CHLAMYDIA PROBE AMP (~~LOC~~) NOT AT ARMC
CHLAMYDIA, DNA PROBE: NEGATIVE
Neisseria Gonorrhea: NEGATIVE

## 2018-08-01 LAB — HIV 1/2 AB DIFFERENTIATION
HIV 1 Ab: POSITIVE — AB
HIV 2 Ab: NEGATIVE

## 2018-08-01 LAB — RPR: RPR: NONREACTIVE

## 2018-08-01 LAB — HIV ANTIBODY (ROUTINE TESTING W REFLEX): HIV Screen 4th Generation wRfx: REACTIVE — AB

## 2018-08-08 ENCOUNTER — Other Ambulatory Visit: Payer: Self-pay

## 2018-08-08 ENCOUNTER — Encounter (HOSPITAL_BASED_OUTPATIENT_CLINIC_OR_DEPARTMENT_OTHER): Payer: Self-pay | Admitting: Emergency Medicine

## 2018-08-08 ENCOUNTER — Emergency Department (HOSPITAL_BASED_OUTPATIENT_CLINIC_OR_DEPARTMENT_OTHER)
Admission: EM | Admit: 2018-08-08 | Discharge: 2018-08-08 | Discharge status: Against Medical Advice | Payer: Self-pay | Attending: Emergency Medicine | Admitting: Emergency Medicine

## 2018-08-08 DIAGNOSIS — Z5321 Procedure and treatment not carried out due to patient leaving prior to being seen by health care provider: Secondary | ICD-10-CM | POA: Insufficient documentation

## 2018-08-08 DIAGNOSIS — R197 Diarrhea, unspecified: Secondary | ICD-10-CM | POA: Insufficient documentation

## 2018-08-08 NOTE — ED Notes (Signed)
Pt sts she is going to leave because she has to be at work at 27 and does not think she will be out in time to make it.

## 2018-08-08 NOTE — ED Triage Notes (Signed)
Pt c/o LUQ  abd pain x 4 days; reports NVD since yesterday

## 2019-02-03 ENCOUNTER — Emergency Department (HOSPITAL_BASED_OUTPATIENT_CLINIC_OR_DEPARTMENT_OTHER)
Admission: EM | Admit: 2019-02-03 | Discharge: 2019-02-03 | Disposition: A | Discharge status: Home / Self Care | Payer: Self-pay | Attending: Emergency Medicine | Admitting: Emergency Medicine

## 2019-02-03 ENCOUNTER — Encounter (HOSPITAL_BASED_OUTPATIENT_CLINIC_OR_DEPARTMENT_OTHER): Payer: Self-pay

## 2019-02-03 ENCOUNTER — Emergency Department (HOSPITAL_BASED_OUTPATIENT_CLINIC_OR_DEPARTMENT_OTHER): Payer: Self-pay

## 2019-02-03 ENCOUNTER — Other Ambulatory Visit: Payer: Self-pay

## 2019-02-03 DIAGNOSIS — Z21 Asymptomatic human immunodeficiency virus [HIV] infection status: Secondary | ICD-10-CM | POA: Insufficient documentation

## 2019-02-03 DIAGNOSIS — J0100 Acute maxillary sinusitis, unspecified: Secondary | ICD-10-CM | POA: Insufficient documentation

## 2019-02-03 DIAGNOSIS — J449 Chronic obstructive pulmonary disease, unspecified: Secondary | ICD-10-CM | POA: Insufficient documentation

## 2019-02-03 DIAGNOSIS — Z79899 Other long term (current) drug therapy: Secondary | ICD-10-CM | POA: Insufficient documentation

## 2019-02-03 DIAGNOSIS — E119 Type 2 diabetes mellitus without complications: Secondary | ICD-10-CM | POA: Insufficient documentation

## 2019-02-03 DIAGNOSIS — F17210 Nicotine dependence, cigarettes, uncomplicated: Secondary | ICD-10-CM | POA: Insufficient documentation

## 2019-02-03 LAB — PREGNANCY, URINE: Preg Test, Ur: NEGATIVE

## 2019-02-03 LAB — GROUP A STREP BY PCR: GROUP A STREP BY PCR: NOT DETECTED

## 2019-02-03 MED ORDER — BENZONATATE 100 MG PO CAPS: 100.0000 mg | ORAL_CAPSULE | Freq: Three times a day (TID) | ORAL | 0 refills | Status: DC

## 2019-02-03 MED ORDER — ONDANSETRON HCL 4 MG/2ML IJ SOLN
4.0000 mg | Freq: Once | INTRAMUSCULAR | Status: AC
  Administered 2019-02-03: 4 mg via INTRAVENOUS
  Filled 2019-02-03: qty 2

## 2019-02-03 MED ORDER — SODIUM CHLORIDE 0.9 % IV BOLUS
1000.0000 mL | Freq: Once | INTRAVENOUS | Status: AC
  Administered 2019-02-03: 1000 mL via INTRAVENOUS

## 2019-02-03 MED ORDER — IPRATROPIUM-ALBUTEROL 0.5-2.5 (3) MG/3ML IN SOLN
3.0000 mL | Freq: Once | RESPIRATORY_TRACT | Status: AC
  Administered 2019-02-03: 3 mL via RESPIRATORY_TRACT
  Filled 2019-02-03: qty 3

## 2019-02-03 MED ORDER — AMOXICILLIN-POT CLAVULANATE 875-125 MG PO TABS
1.0000 | ORAL_TABLET | Freq: Once | ORAL | Status: AC
  Administered 2019-02-03: 1 via ORAL
  Filled 2019-02-03: qty 1

## 2019-02-03 MED ORDER — DIPHENHYDRAMINE HCL 50 MG/ML IJ SOLN
25.0000 mg | Freq: Once | INTRAMUSCULAR | Status: AC
  Administered 2019-02-03: 25 mg via INTRAVENOUS
  Filled 2019-02-03: qty 1

## 2019-02-03 MED ORDER — KETOROLAC TROMETHAMINE 30 MG/ML IJ SOLN
30.0000 mg | Freq: Once | INTRAMUSCULAR | Status: AC
  Administered 2019-02-03: 30 mg via INTRAVENOUS
  Filled 2019-02-03: qty 1

## 2019-02-03 MED ORDER — METOCLOPRAMIDE HCL 5 MG/ML IJ SOLN
10.0000 mg | Freq: Once | INTRAMUSCULAR | Status: AC
  Administered 2019-02-03: 10 mg via INTRAVENOUS
  Filled 2019-02-03: qty 2

## 2019-02-03 MED ORDER — ALBUTEROL SULFATE HFA 108 (90 BASE) MCG/ACT IN AERS: 2.0000 | INHALATION_SPRAY | RESPIRATORY_TRACT | 0 refills | Status: DC | PRN

## 2019-02-03 MED ORDER — AMOXICILLIN-POT CLAVULANATE 875-125 MG PO TABS: 1.0000 | ORAL_TABLET | Freq: Two times a day (BID) | ORAL | 0 refills | Status: DC

## 2019-02-03 NOTE — ED Notes (Signed)
Pt reports taking no medications today

## 2019-02-03 NOTE — ED Notes (Signed)
Pt tolerated sprite.

## 2019-02-03 NOTE — ED Triage Notes (Signed)
Pt c/o cough, congestion and migraine type headache for a week, denies fevers

## 2019-02-03 NOTE — Discharge Instructions (Signed)
You can take Tylenol or Ibuprofen as directed for pain. You can alternate Tylenol and Ibuprofen every 4 hours. If you take Tylenol at 1pm, then you can take Ibuprofen at 5pm. Then you can take Tylenol again at 9pm.   Take antibiotics as directed. Please take all of your antibiotics until finished.  Tessalon Perles for cough.  Make sure you are using your inhalers.  Follow-up with your PCP in the next 2 to 4 to 4 days.  The emergency department for any worsening pain, fever, difficulty walking, numbness/weakness of your arms or legs or any other worsening or concerning symptoms.

## 2019-02-03 NOTE — ED Provider Notes (Signed)
[facility] EMERGENCY DEPARTMENT Provider Note   CSN: [id number] Arrival date & time: 02/03/19  1832    History   Chief Complaint Chief Complaint  Patient presents with  . URI    HPI Renee Bryant is a 38 y.o. female past medical history of HIV, COPD, diabetes who presents for evaluation of 1 week of cough, nasal congestion, headache, chest tightness.  Patient reports that headache began gradually and worsened.  She does report history of migraine headaches and states that this feels similar to her previous migraines.  She has tried taking Excedrin, caffeine and her other medications with no improvement in headache.  Patient states that she has not noted any fever.  Patient also reports he has had some cough, nasal congestion.  She states cough is productive of clear phlegm.  No hemoptysis.  Patient feels like she is having some chest tightness.  She reports she was using her inhalers but did not notice a difference in the cough.  Patient also reports she has had a sore throat.  She reports she has not taken any medications for pain.  States she still able tolerate her secretions, tolerate p.o. but does report worsening pain with doing so.  She did not get a flu shot this year.  Patient reports she has had some sick contacts.  Patient denies any abdominal pain, nausea/vomiting, vision changes, numbness/weakness of her arms or legs.     The history is provided by the patient.    Past Medical History:  Diagnosis Date  . Abscess   . Asthma   . COPD (chronic obstructive pulmonary disease) ([place])   . Diabetes mellitus without complication ([place])   . HIV (human immunodeficiency virus infection) ([place])   . Pneumonia     There are no active problems to display for this patient.   Past Surgical History:  Procedure Laterality Date  . CESAREAN SECTION    . CHOLECYSTECTOMY    . TUBAL LIGATION       OB History   No obstetric history on file.      Home Medications    Prior  to Admission medications   Medication Sig Start Date End Date Taking? Authorizing Provider  albuterol (PROVENTIL HFA;VENTOLIN HFA) 108 (90 Base) MCG/ACT inhaler Inhale 2 puffs into the lungs every 4 (four) hours as needed for wheezing or shortness of breath. 02/03/19   Volanda Napoleon, PA-C  amoxicillin-clavulanate (AUGMENTIN) 875-125 MG tablet Take 1 tablet by mouth every 12 (twelve) hours. 02/03/19   Volanda Napoleon, PA-C  azithromycin (ZITHROMAX) 250 MG tablet Take 1 tablet (250 mg total) by mouth daily. Take first 2 tablets together, then 1 every day until finished. 10/21/17   Fransico Meadow, PA-C  benzonatate (TESSALON) 100 MG capsule Take 1 capsule (100 mg total) by mouth every 8 (eight) hours. 02/03/19   Volanda Napoleon, PA-C  bictegravir-emtricitabine-tenofovir AF (BIKTARVY) 50-200-25 MG TABS tablet Take by mouth daily.    [provider]  clonazePAM (KLONOPIN) 0.5 MG tablet Take 1 mg by mouth 2 (two) times daily.     [provider]  Elviteg-Cobic-Emtricit-TenofAF (GENVOYA PO) Take by mouth.    [provider]  GABAPENTIN PO Take by mouth.    [provider]  ibuprofen (ADVIL,MOTRIN) 800 MG tablet Take 1 tablet (800 mg total) by mouth every 8 (eight) hours as needed. 11/17/17   Long, Wonda Olds, MD  meloxicam (MOBIC) 7.5 MG tablet Take 2 tablets (15 mg total) by mouth  daily. 07/30/18   Margarita Mail, PA-C  naproxen (NAPROSYN) 500 MG tablet Take 1 tablet (500 mg total) by mouth 2 (two) times daily. 12/05/16   Carlisle Cater, PA-C  norgestimate-ethinyl estradiol (SPRINTEC 28) 0.25-35 MG-MCG tablet Take 1 tablet by mouth daily. 11/17/17   Long, Wonda Olds, MD  omeprazole (PRILOSEC) 20 MG capsule Take 20 mg by mouth daily.    [provider]  predniSONE (DELTASONE) 20 MG tablet Take 3 tablets (60 mg total) by mouth daily. 01/30/18   Ward, Delice Bison, DO  pseudoephedrine (SUDAFED) 60 MG tablet Take 1 tablet (60 mg total) by mouth every 4 (four) hours as  needed for congestion. 12/05/16   Carlisle Cater, PA-C  traMADol (ULTRAM) 50 MG tablet Take 1 tablet (50 mg total) by mouth every 6 (six) hours as needed. 07/30/18   Margarita Mail, PA-C    Family History No family history on file.  Social History Social History   Tobacco Use  . Smoking status: Current Some Day Smoker    Packs/day: 1.00    Types: Cigarettes  . Smokeless tobacco: Never Used  Substance Use Topics  . Alcohol use: No    Frequency: Never  . Drug use: No     Allergies   Wellbutrin [bupropion]   Review of Systems Review of Systems  Constitutional: Negative for fever.  HENT: Positive for congestion, rhinorrhea and sore throat.   Respiratory: Positive for cough. Negative for shortness of breath.   Cardiovascular: Negative for chest pain.  Gastrointestinal: Negative for abdominal pain, nausea and vomiting.  Genitourinary: Negative for dysuria and hematuria.  Neurological: Positive for headaches.  All other systems reviewed and are negative.    Physical Exam Updated Vital Signs BP (!) 109/58 (BP Location: Left Arm)   Pulse 92   Temp 98.1 F (36.7 C) (Oral)   Resp 18   Ht 5\' 7"  (1.702 m)   Wt 68 kg   LMP 02/01/2019   SpO2 98%   BMI 23.49 kg/m   Physical Exam Vitals signs and nursing note reviewed.  Constitutional:      Appearance: Normal appearance. She is well-developed.  HENT:     Head: Normocephalic and atraumatic.     Nose:     Right Sinus: Maxillary sinus tenderness present.     Left Sinus: Maxillary sinus tenderness present.     Mouth/Throat:     Pharynx: Uvula midline. Posterior oropharyngeal erythema present.     Comments: Airway is patent, phonation is intact. Uvula is midline.  No trismus.  Posterior oropharynx is erythematous.  No exudates, edema.  Eyes:     General: Lids are normal.     Conjunctiva/sclera: Conjunctivae normal.     Pupils: Pupils are equal, round, and reactive to light.  Neck:     Musculoskeletal: Full passive range  of motion without pain.  Cardiovascular:     Rate and Rhythm: Normal rate and regular rhythm.     Pulses: Normal pulses.     Heart sounds: Normal heart sounds. No murmur. No friction rub. No gallop.   Pulmonary:     Effort: Pulmonary effort is normal.     Breath sounds: Normal breath sounds.     Comments: Lungs clear to auscultation bilaterally.  Symmetric chest rise.  No wheezing, rales, rhonchi. Abdominal:     Palpations: Abdomen is soft. Abdomen is not rigid.     Tenderness: There is no abdominal tenderness. There is no guarding.  Musculoskeletal: Normal range of motion.  Skin:  General: Skin is warm and dry.     Capillary Refill: Capillary refill takes less than 2 seconds.  Neurological:     Mental Status: She is alert and oriented to person, place, and time.     Comments: Cranial nerves III-XII intact Follows commands, Moves all extremities  5/5 strength to BUE and BLE  Sensation intact throughout all major nerve distributions Normal coordination No slurred speech. No facial droop.   Psychiatric:        Speech: Speech normal.      ED Treatments / Results  Labs (all labs ordered are listed, but only abnormal results are displayed) Labs Reviewed  GROUP A STREP BY PCR  PREGNANCY, URINE    EKG None  Radiology Dg Chest 2 View  Result Date: 02/03/2019 CLINICAL DATA:  Cough and sore throat. EXAM: CHEST - 2 VIEW COMPARISON:  12/09/2018 FINDINGS: Cardiomediastinal silhouette is normal. Mediastinal contours appear intact. There is no evidence of focal airspace consolidation, pleural effusion or pneumothorax. Osseous structures are without acute abnormality. Soft tissues are grossly normal. IMPRESSION: No active cardiopulmonary disease. Electronically Signed   By: Fidela Salisbury M.D.   On: 02/03/2019 20:04    Procedures Procedures (including critical care time)  Medications Ordered in ED Medications  sodium chloride 0.9 % bolus 1,000 mL (0 mLs Intravenous Stopped  02/03/19 2038)  ondansetron (ZOFRAN) injection 4 mg (4 mg Intravenous Given 02/03/19 1936)  ketorolac (TORADOL) 30 MG/ML injection 30 mg (30 mg Intravenous Given 02/03/19 1937)  diphenhydrAMINE (BENADRYL) injection 25 mg (25 mg Intravenous Given 02/03/19 1935)  metoCLOPramide (REGLAN) injection 10 mg (10 mg Intravenous Given 02/03/19 1937)  ipratropium-albuterol (DUONEB) 0.5-2.5 (3) MG/3ML nebulizer solution 3 mL (3 mLs Nebulization Given 02/03/19 1923)  amoxicillin-clavulanate (AUGMENTIN) 875-125 MG per tablet 1 tablet (1 tablet Oral Given 02/03/19 2140)     Initial Impression / Assessment and Plan / ED Course  I have reviewed the triage vital signs and the nursing notes.  Pertinent labs & imaging results that were available during my care of the patient were reviewed by me and considered in my medical decision making (see chart for details).        38 year old female past medical history of HIV, COPD, diabetes who presents for evaluation of 1 week of headache, congestion, cough, sore throat.  No fevers noted at home.  She reports history of migraines and states this feels similar to her migraine but has not improved with her normal medications.  Not associated with any vision changes, nausea/vomiting, numbness/weakness. Patient is afebrile, non-toxic appearing, sitting comfortably on examination table. Vital signs reviewed and stable.  No neuro deficits noted on exam.  She does have some maxillary sinus pressure as well as some congestion.  Lungs clear to auscultation.  No evidence of wheezing.  Patient states she feels like she needs a breathing treatment.  Will plan for breathing treatment, chest x-ray.  Strep ordered at triage.  Additionally, will give migraine cocktail.  History/physical exam not concerning for ICH, CVA.  History/physical exam not concerning for meningitis.  Strep negative. Pregnancy negative. CXR negative for any acute abnormality.   Reevaluation.  Patient reports feeling much  better after migraine cocktail and breathing treatment.  Patient's states she feels like headache has improved significantly.  We will plan to p.o. challenge.  Patient able tolerate p.o. without any difficulty.  Patient states she feels ready to go home.  Vital signs are stable. At this time, patient exhibits no emergent life-threatening condition that require  further evaluation in ED or admission. Patient had ample opportunity for questions and discussion. All patient's questions were answered with full understanding. Strict return precautions discussed. Patient expresses understanding and agreement to plan.    Portions of this note were generated with [employer]. Dictation errors may occur despite best attempts at proofreading.   Final Clinical Impressions(s) / ED Diagnoses   Final diagnoses:  Acute non-recurrent maxillary sinusitis    ED Discharge Orders         Ordered    amoxicillin-clavulanate (AUGMENTIN) 875-125 MG tablet  Every 12 hours     02/03/19 2137    benzonatate (TESSALON) 100 MG capsule  Every 8 hours     02/03/19 2137    albuterol (PROVENTIL HFA;VENTOLIN HFA) 108 (90 Base) MCG/ACT inhaler  Every 4 hours PRN     02/03/19 2137           Desma Mcgregor 02/04/19 0007    Isla Pence, MD 02/04/19 1502

## 2019-03-05 IMAGING — CR DG CHEST 2V
2 series · 2 of 2 positions shown · non-contrast
Comparison: 10/21/2017 chest radiograph.

CLINICAL DATA: Cough for 1 week

EXAM:
CHEST  2 VIEW

[w chest pa]
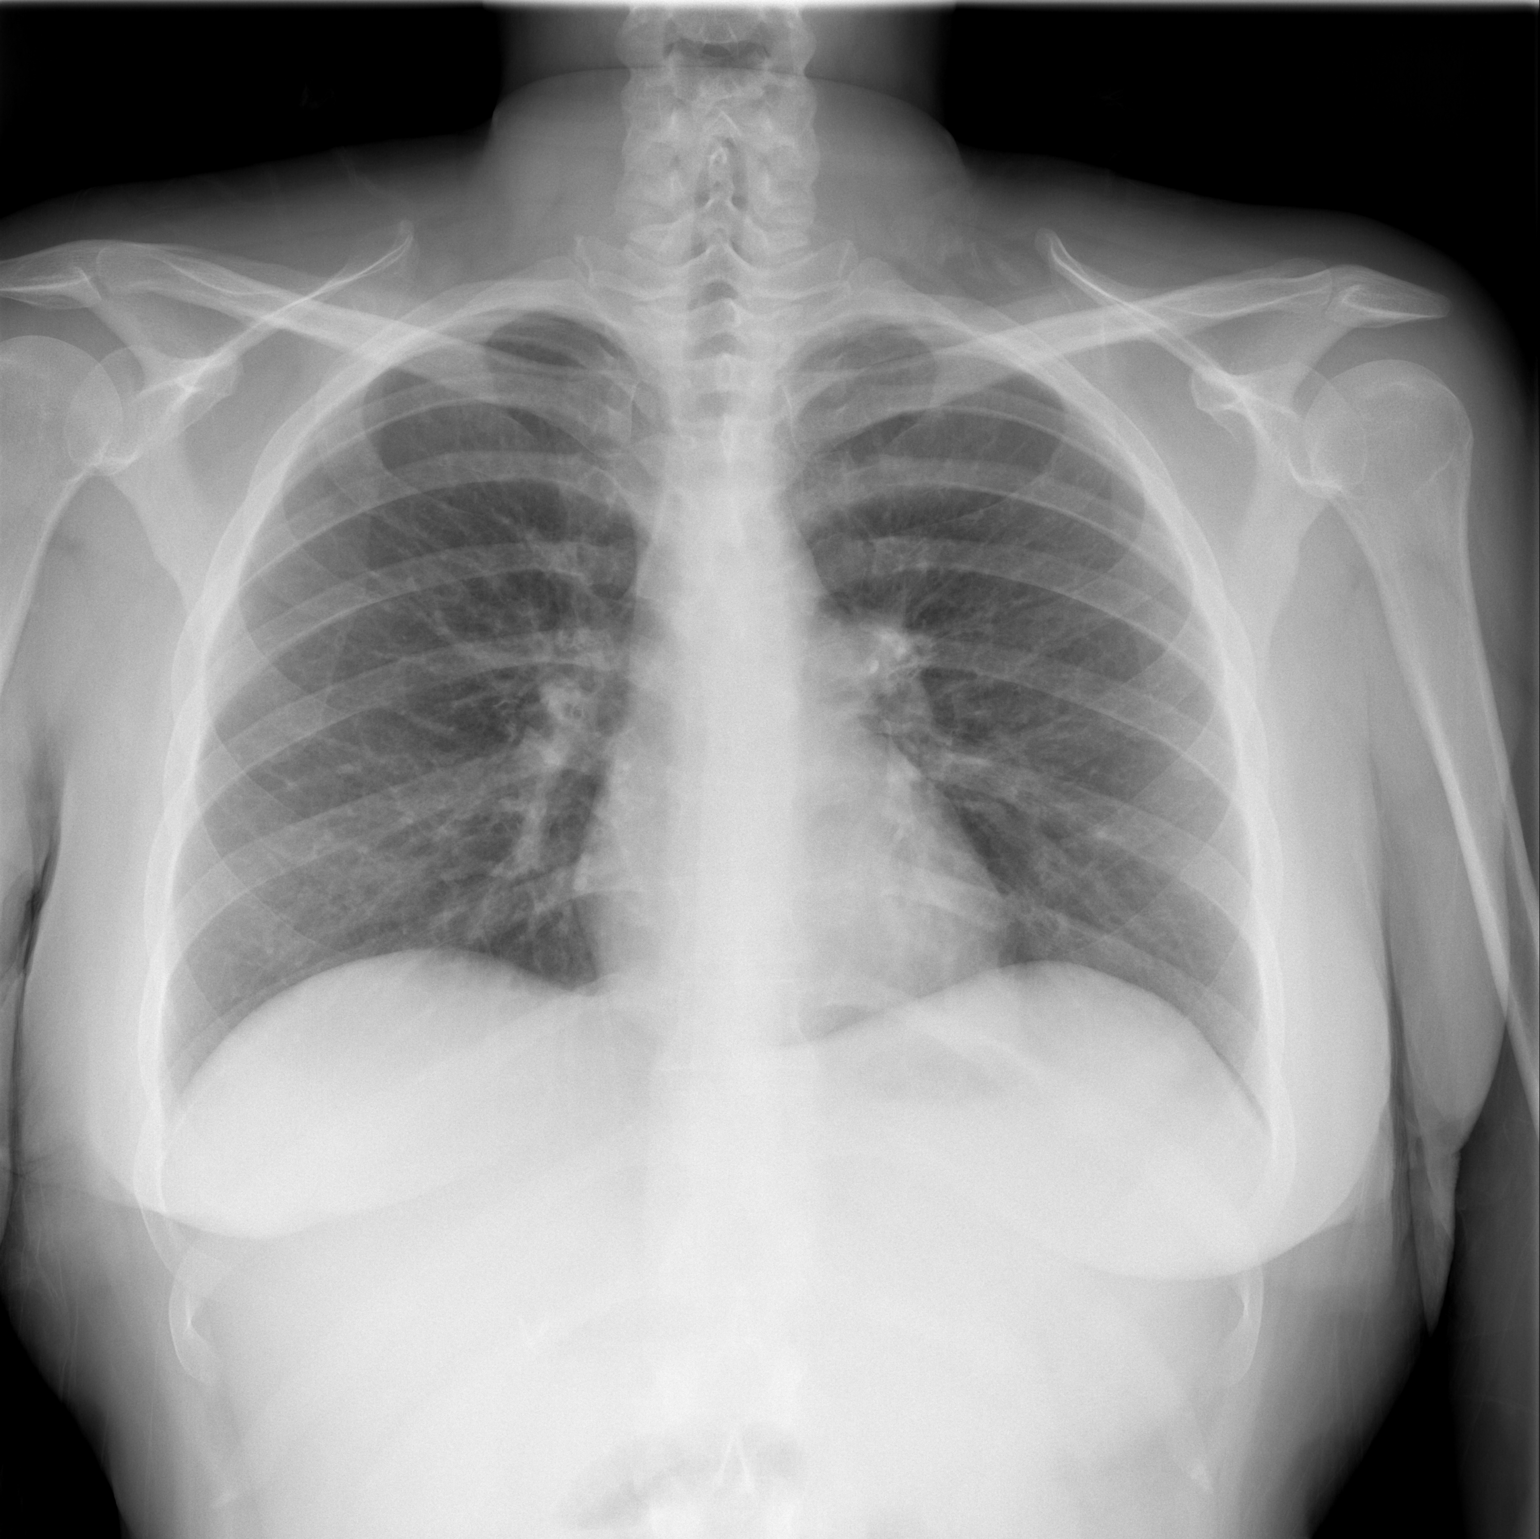

[w chest lat]
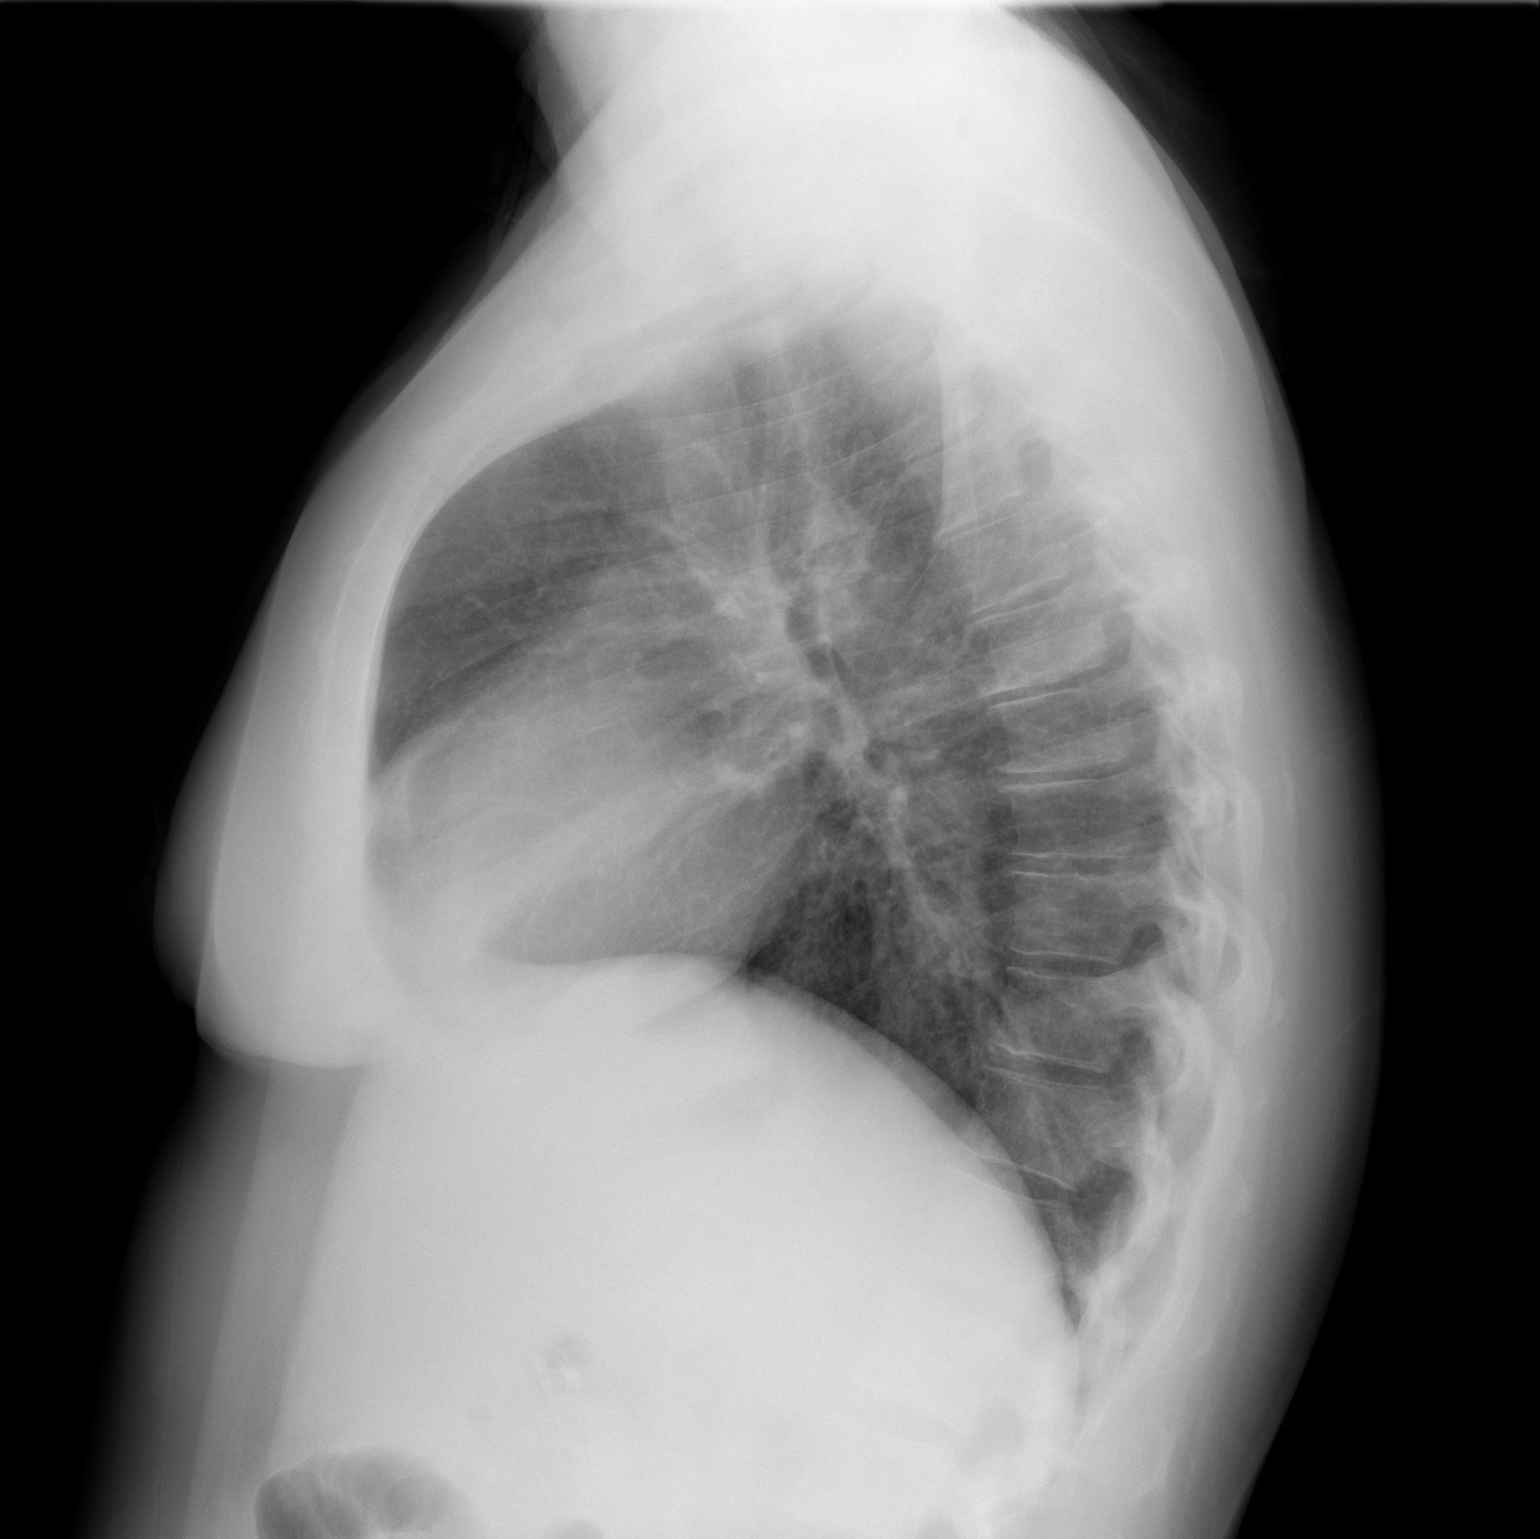

[2 of 2 positions shown; findings below may reference images not displayed]

FINDINGS: Stable cardiomediastinal silhouette with normal heart size. No
pneumothorax. No pleural effusion. Lungs appear clear, with no acute
consolidative airspace disease and no pulmonary edema.
IMPRESSION: No active cardiopulmonary disease.

## 2019-04-25 ENCOUNTER — Emergency Department (HOSPITAL_BASED_OUTPATIENT_CLINIC_OR_DEPARTMENT_OTHER): Payer: Self-pay

## 2019-04-25 ENCOUNTER — Encounter (HOSPITAL_BASED_OUTPATIENT_CLINIC_OR_DEPARTMENT_OTHER): Payer: Self-pay | Admitting: Emergency Medicine

## 2019-04-25 ENCOUNTER — Emergency Department (HOSPITAL_BASED_OUTPATIENT_CLINIC_OR_DEPARTMENT_OTHER)
Admission: EM | Admit: 2019-04-25 | Discharge: 2019-04-25 | Disposition: A | Discharge status: Home / Self Care | Payer: Self-pay | Attending: Emergency Medicine | Admitting: Emergency Medicine

## 2019-04-25 ENCOUNTER — Other Ambulatory Visit: Payer: Self-pay

## 2019-04-25 DIAGNOSIS — M545 Low back pain: Secondary | ICD-10-CM | POA: Insufficient documentation

## 2019-04-25 DIAGNOSIS — F17210 Nicotine dependence, cigarettes, uncomplicated: Secondary | ICD-10-CM | POA: Insufficient documentation

## 2019-04-25 DIAGNOSIS — N926 Irregular menstruation, unspecified: Secondary | ICD-10-CM | POA: Insufficient documentation

## 2019-04-25 DIAGNOSIS — B20 Human immunodeficiency virus [HIV] disease: Secondary | ICD-10-CM | POA: Insufficient documentation

## 2019-04-25 DIAGNOSIS — Y92414 Local residential or business street as the place of occurrence of the external cause: Secondary | ICD-10-CM | POA: Insufficient documentation

## 2019-04-25 DIAGNOSIS — Y998 Other external cause status: Secondary | ICD-10-CM | POA: Insufficient documentation

## 2019-04-25 DIAGNOSIS — E119 Type 2 diabetes mellitus without complications: Secondary | ICD-10-CM | POA: Insufficient documentation

## 2019-04-25 DIAGNOSIS — Y9389 Activity, other specified: Secondary | ICD-10-CM | POA: Insufficient documentation

## 2019-04-25 DIAGNOSIS — J449 Chronic obstructive pulmonary disease, unspecified: Secondary | ICD-10-CM | POA: Insufficient documentation

## 2019-04-25 DIAGNOSIS — Z79899 Other long term (current) drug therapy: Secondary | ICD-10-CM | POA: Insufficient documentation

## 2019-04-25 LAB — PREGNANCY, URINE: Preg Test, Ur: NEGATIVE

## 2019-04-25 MED ORDER — METHOCARBAMOL 500 MG PO TABS: 500.0000 mg | ORAL_TABLET | Freq: Three times a day (TID) | ORAL | 0 refills | Status: DC | PRN

## 2019-04-25 MED ORDER — LIDOCAINE 5 % EX PTCH: 1.0000 | MEDICATED_PATCH | CUTANEOUS | 0 refills | Status: DC
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
## 2019-04-25 NOTE — ED Notes (Signed)
Imaging after u preg results 
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
## 2019-05-05 ENCOUNTER — Encounter (HOSPITAL_BASED_OUTPATIENT_CLINIC_OR_DEPARTMENT_OTHER): Payer: Self-pay | Admitting: Emergency Medicine

## 2019-05-05 ENCOUNTER — Other Ambulatory Visit: Payer: Self-pay

## 2019-05-05 ENCOUNTER — Emergency Department (HOSPITAL_BASED_OUTPATIENT_CLINIC_OR_DEPARTMENT_OTHER)
Admission: EM | Admit: 2019-05-05 | Discharge: 2019-05-05 | Disposition: A | Discharge status: Home / Self Care | Payer: Self-pay | Attending: Emergency Medicine | Admitting: Emergency Medicine

## 2019-05-05 ENCOUNTER — Emergency Department (HOSPITAL_BASED_OUTPATIENT_CLINIC_OR_DEPARTMENT_OTHER): Payer: Self-pay

## 2019-05-05 DIAGNOSIS — N3000 Acute cystitis without hematuria: Secondary | ICD-10-CM

## 2019-05-05 DIAGNOSIS — Z794 Long term (current) use of insulin: Secondary | ICD-10-CM | POA: Insufficient documentation

## 2019-05-05 DIAGNOSIS — Z79899 Other long term (current) drug therapy: Secondary | ICD-10-CM | POA: Insufficient documentation

## 2019-05-05 DIAGNOSIS — J449 Chronic obstructive pulmonary disease, unspecified: Secondary | ICD-10-CM | POA: Insufficient documentation

## 2019-05-05 DIAGNOSIS — J45909 Unspecified asthma, uncomplicated: Secondary | ICD-10-CM | POA: Insufficient documentation

## 2019-05-05 DIAGNOSIS — Z21 Asymptomatic human immunodeficiency virus [HIV] infection status: Secondary | ICD-10-CM | POA: Insufficient documentation

## 2019-05-05 DIAGNOSIS — F17210 Nicotine dependence, cigarettes, uncomplicated: Secondary | ICD-10-CM | POA: Insufficient documentation

## 2019-05-05 DIAGNOSIS — E119 Type 2 diabetes mellitus without complications: Secondary | ICD-10-CM | POA: Insufficient documentation

## 2019-05-05 LAB — CBC WITH DIFFERENTIAL/PLATELET
Abs Immature Granulocytes: 0 10*3/uL (ref 0.00–0.07)
Basophils Absolute: 0 10*3/uL (ref 0.0–0.1)
Basophils Relative: 1 %
Eosinophils Absolute: 0.1 10*3/uL (ref 0.0–0.5)
Eosinophils Relative: 1 %
HCT: 39.6 % (ref 36.0–46.0)
Hemoglobin: 13.5 g/dL (ref 12.0–15.0)
Immature Granulocytes: 0 %
Lymphocytes Relative: 45 %
Lymphs Abs: 2.6 10*3/uL (ref 0.7–4.0)
MCH: 29.9 pg (ref 26.0–34.0)
MCHC: 34.1 g/dL (ref 30.0–36.0)
MCV: 87.6 fL (ref 80.0–100.0)
Monocytes Absolute: 0.6 10*3/uL (ref 0.1–1.0)
Monocytes Relative: 11 %
Neutro Abs: 2.3 10*3/uL (ref 1.7–7.7)
Neutrophils Relative %: 42 %
Platelets: 148 10*3/uL — ABNORMAL LOW (ref 150–400)
RBC: 4.52 MIL/uL (ref 3.87–5.11)
RDW: 12 % (ref 11.5–15.5)
WBC: 5.6 10*3/uL (ref 4.0–10.5)
nRBC: 0 % (ref 0.0–0.2)

## 2019-05-05 LAB — URINALYSIS, ROUTINE W REFLEX MICROSCOPIC
Bilirubin Urine: NEGATIVE
Glucose, UA: >=500 mg/dL — AB
Ketones, ur: NEGATIVE mg/dL
Leukocytes,Ua: NEGATIVE
Nitrite: NEGATIVE
Protein, ur: >300 mg/dL — AB
Specific Gravity, Urine: >1.03 — ABNORMAL HIGH (ref 1.005–1.030)
pH: 6 (ref 5.0–8.0)

## 2019-05-05 LAB — URINALYSIS, MICROSCOPIC (REFLEX)

## 2019-05-05 LAB — PREGNANCY, URINE: Preg Test, Ur: NEGATIVE

## 2019-05-05 LAB — BASIC METABOLIC PANEL
Anion gap: 7 (ref 5–15)
BUN: 14 mg/dL (ref 6–20)
CO2: 25 mmol/L (ref 22–32)
Calcium: 8.7 mg/dL — ABNORMAL LOW (ref 8.9–10.3)
Chloride: 103 mmol/L (ref 98–111)
Creatinine, Ser: 0.51 mg/dL (ref 0.44–1.00)
GFR calc Af Amer: >60 mL/min (ref >60)
GFR calc non Af Amer: >60 mL/min (ref >60)
Glucose, Bld: 385 mg/dL — ABNORMAL HIGH (ref 70–99)
Potassium: 3.7 mmol/L (ref 3.5–5.1)
Sodium: 135 mmol/L (ref 135–145)

## 2019-05-05 MED ORDER — CEPHALEXIN 500 MG PO CAPS: 500.0000 mg | ORAL_CAPSULE | Freq: Four times a day (QID) | ORAL | 0 refills | Status: DC

## 2019-05-05 MED ORDER — PHENAZOPYRIDINE HCL 100 MG PO TABS
200.0000 mg | ORAL_TABLET | Freq: Once | ORAL | Status: AC
  Administered 2019-05-05: 200 mg via ORAL
  Filled 2019-05-05: qty 2

## 2019-05-05 MED ORDER — PHENAZOPYRIDINE HCL 200 MG PO TABS: 200.0000 mg | ORAL_TABLET | Freq: Three times a day (TID) | ORAL | 0 refills | Status: DC

## 2019-05-05 MED ORDER — CEPHALEXIN 250 MG PO CAPS
500.0000 mg | ORAL_CAPSULE | Freq: Once | ORAL | Status: AC
  Administered 2019-05-05: 500 mg via ORAL
  Filled 2019-05-05: qty 2

## 2019-05-05 NOTE — ED Triage Notes (Signed)
Pt c/o LLQ abd pain that radiated around to left side.
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
## 2019-10-08 DIAGNOSIS — E1142 Type 2 diabetes mellitus with diabetic polyneuropathy: Secondary | ICD-10-CM | POA: Insufficient documentation

## 2019-11-23 DIAGNOSIS — E782 Mixed hyperlipidemia: Secondary | ICD-10-CM | POA: Insufficient documentation

## 2020-02-22 ENCOUNTER — Other Ambulatory Visit: Payer: Self-pay | Admitting: *Deleted

## 2020-05-26 DIAGNOSIS — Z419 Encounter for procedure for purposes other than remedying health state, unspecified: Secondary | ICD-10-CM | POA: Diagnosis not present

## 2020-06-09 DIAGNOSIS — F4310 Post-traumatic stress disorder, unspecified: Secondary | ICD-10-CM | POA: Diagnosis not present

## 2020-06-09 DIAGNOSIS — G629 Polyneuropathy, unspecified: Secondary | ICD-10-CM | POA: Diagnosis not present

## 2020-06-09 DIAGNOSIS — S99912A Unspecified injury of left ankle, initial encounter: Secondary | ICD-10-CM | POA: Diagnosis not present

## 2020-06-10 DIAGNOSIS — F321 Major depressive disorder, single episode, moderate: Secondary | ICD-10-CM | POA: Diagnosis not present

## 2020-06-10 DIAGNOSIS — F1111 Opioid abuse, in remission: Secondary | ICD-10-CM | POA: Diagnosis not present

## 2020-06-10 DIAGNOSIS — F4312 Post-traumatic stress disorder, chronic: Secondary | ICD-10-CM | POA: Diagnosis not present

## 2020-06-10 DIAGNOSIS — G4700 Insomnia, unspecified: Secondary | ICD-10-CM | POA: Diagnosis not present

## 2020-06-10 DIAGNOSIS — B20 Human immunodeficiency virus [HIV] disease: Secondary | ICD-10-CM | POA: Diagnosis not present

## 2020-06-10 DIAGNOSIS — E119 Type 2 diabetes mellitus without complications: Secondary | ICD-10-CM | POA: Diagnosis not present

## 2020-06-10 DIAGNOSIS — I10 Essential (primary) hypertension: Secondary | ICD-10-CM | POA: Diagnosis not present

## 2020-06-12 DIAGNOSIS — R0683 Snoring: Secondary | ICD-10-CM | POA: Diagnosis not present

## 2020-06-12 DIAGNOSIS — G4761 Periodic limb movement disorder: Secondary | ICD-10-CM | POA: Diagnosis not present

## 2020-06-14 DIAGNOSIS — F17210 Nicotine dependence, cigarettes, uncomplicated: Secondary | ICD-10-CM | POA: Diagnosis not present

## 2020-06-14 DIAGNOSIS — M25512 Pain in left shoulder: Secondary | ICD-10-CM | POA: Diagnosis not present

## 2020-06-14 DIAGNOSIS — M25532 Pain in left wrist: Secondary | ICD-10-CM | POA: Diagnosis not present

## 2020-06-14 DIAGNOSIS — M25511 Pain in right shoulder: Secondary | ICD-10-CM | POA: Diagnosis not present

## 2020-06-14 DIAGNOSIS — M542 Cervicalgia: Secondary | ICD-10-CM | POA: Diagnosis not present

## 2020-06-14 DIAGNOSIS — E119 Type 2 diabetes mellitus without complications: Secondary | ICD-10-CM | POA: Diagnosis not present

## 2020-06-14 DIAGNOSIS — G8929 Other chronic pain: Secondary | ICD-10-CM | POA: Diagnosis not present

## 2020-06-17 DIAGNOSIS — I10 Essential (primary) hypertension: Secondary | ICD-10-CM | POA: Diagnosis not present

## 2020-06-24 DIAGNOSIS — F17210 Nicotine dependence, cigarettes, uncomplicated: Secondary | ICD-10-CM | POA: Diagnosis not present

## 2020-06-24 DIAGNOSIS — R531 Weakness: Secondary | ICD-10-CM | POA: Diagnosis not present

## 2020-06-24 DIAGNOSIS — E1140 Type 2 diabetes mellitus with diabetic neuropathy, unspecified: Secondary | ICD-10-CM | POA: Diagnosis not present

## 2020-06-24 DIAGNOSIS — Z791 Long term (current) use of non-steroidal anti-inflammatories (NSAID): Secondary | ICD-10-CM | POA: Diagnosis not present

## 2020-06-24 DIAGNOSIS — Z794 Long term (current) use of insulin: Secondary | ICD-10-CM | POA: Diagnosis not present

## 2020-06-24 DIAGNOSIS — Z888 Allergy status to other drugs, medicaments and biological substances status: Secondary | ICD-10-CM | POA: Diagnosis not present

## 2020-06-24 DIAGNOSIS — R9431 Abnormal electrocardiogram [ECG] [EKG]: Secondary | ICD-10-CM | POA: Diagnosis not present

## 2020-06-24 DIAGNOSIS — F419 Anxiety disorder, unspecified: Secondary | ICD-10-CM | POA: Diagnosis not present

## 2020-06-24 DIAGNOSIS — Z79899 Other long term (current) drug therapy: Secondary | ICD-10-CM | POA: Diagnosis not present

## 2020-06-24 DIAGNOSIS — Z21 Asymptomatic human immunodeficiency virus [HIV] infection status: Secondary | ICD-10-CM | POA: Diagnosis not present

## 2020-06-26 DIAGNOSIS — Z419 Encounter for procedure for purposes other than remedying health state, unspecified: Secondary | ICD-10-CM | POA: Diagnosis not present

## 2020-06-27 DIAGNOSIS — R0683 Snoring: Secondary | ICD-10-CM | POA: Diagnosis not present

## 2020-06-27 DIAGNOSIS — E119 Type 2 diabetes mellitus without complications: Secondary | ICD-10-CM | POA: Diagnosis not present

## 2020-06-27 DIAGNOSIS — Z72820 Sleep deprivation: Secondary | ICD-10-CM | POA: Diagnosis not present

## 2020-06-27 DIAGNOSIS — J449 Chronic obstructive pulmonary disease, unspecified: Secondary | ICD-10-CM | POA: Diagnosis not present

## 2020-07-01 DIAGNOSIS — B20 Human immunodeficiency virus [HIV] disease: Secondary | ICD-10-CM | POA: Diagnosis not present

## 2020-07-05 DIAGNOSIS — E119 Type 2 diabetes mellitus without complications: Secondary | ICD-10-CM | POA: Diagnosis not present

## 2020-07-05 DIAGNOSIS — F17210 Nicotine dependence, cigarettes, uncomplicated: Secondary | ICD-10-CM | POA: Diagnosis not present

## 2020-07-05 DIAGNOSIS — I10 Essential (primary) hypertension: Secondary | ICD-10-CM | POA: Diagnosis not present

## 2020-07-05 DIAGNOSIS — K219 Gastro-esophageal reflux disease without esophagitis: Secondary | ICD-10-CM | POA: Diagnosis not present

## 2020-07-22 DIAGNOSIS — M25512 Pain in left shoulder: Secondary | ICD-10-CM | POA: Diagnosis not present

## 2020-07-22 DIAGNOSIS — M25511 Pain in right shoulder: Secondary | ICD-10-CM | POA: Diagnosis not present

## 2020-07-22 DIAGNOSIS — R768 Other specified abnormal immunological findings in serum: Secondary | ICD-10-CM | POA: Diagnosis not present

## 2020-07-27 DIAGNOSIS — Z419 Encounter for procedure for purposes other than remedying health state, unspecified: Secondary | ICD-10-CM | POA: Diagnosis not present

## 2020-08-13 IMAGING — CT CT RENAL STONE PROTOCOL
2 of 4 series · 17 of 46 positions shown, 19 images · non-contrast
Comparison: CT dated 10/06/2017.

CLINICAL DATA: Left lower quadrant and left flank pain. Groin pain.
Acute onset.

EXAM:
CT ABDOMEN AND PELVIS WITHOUT CONTRAST
TECHNIQUE: Multidetector CT imaging of the abdomen and pelvis was performed
following the standard protocol without IV contrast.

[Series 2: axial st · axial · 0.85mm/px · z∈[-774,-314]mm · 14 of 100 slices shown, 16 images]
[im 4/100  soft-tissue]
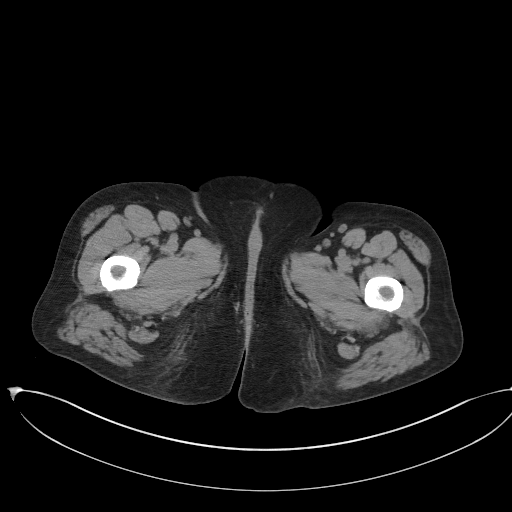
[im 4/100  bone]
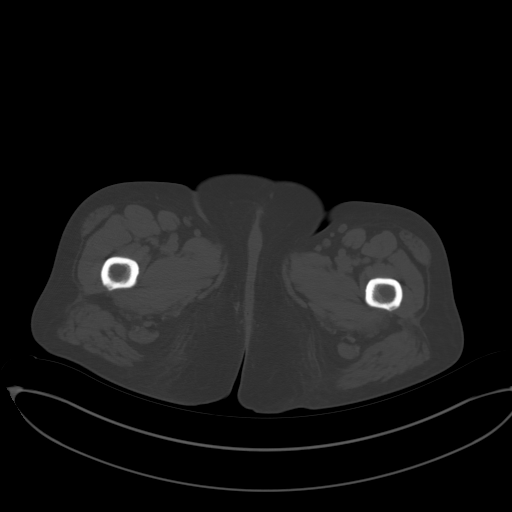
[im 12/100  soft-tissue]
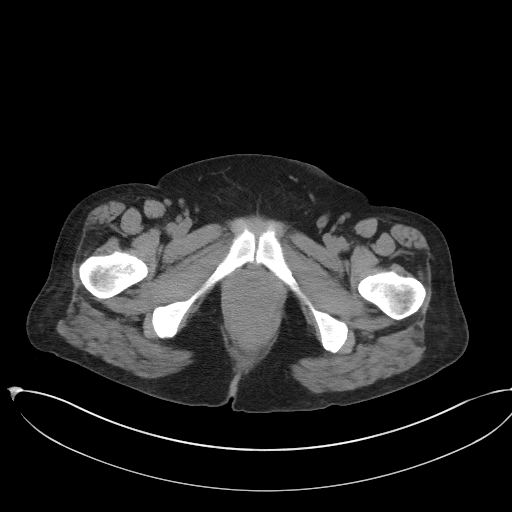
[im 20/100  soft-tissue]
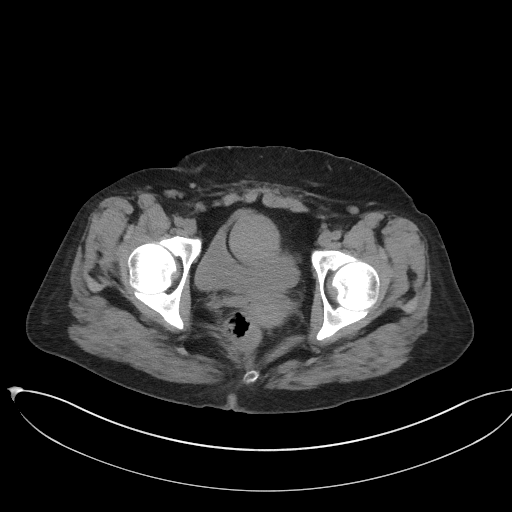
[im 28/100  soft-tissue]
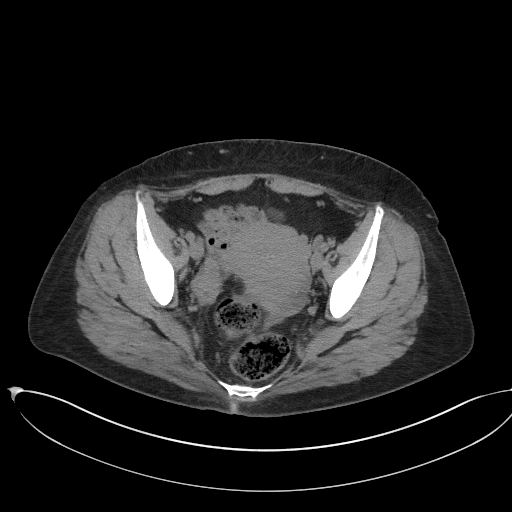
[im 32/100  soft-tissue]
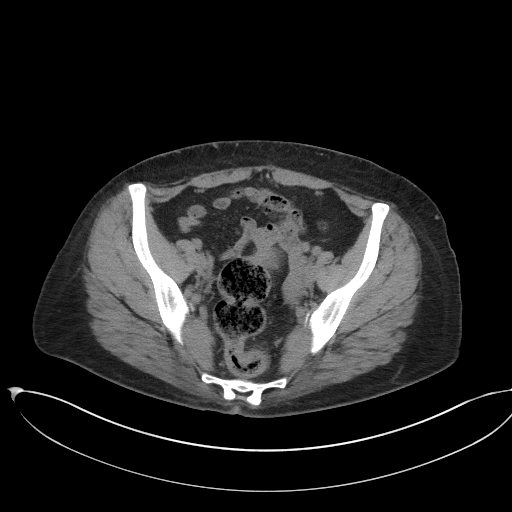
[im 40/100  soft-tissue]
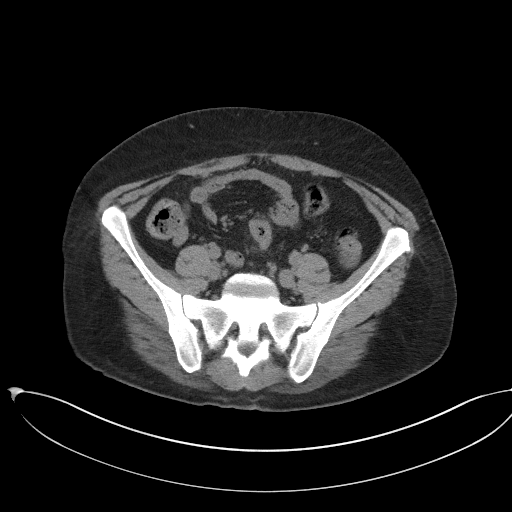
[im 48/100  soft-tissue]
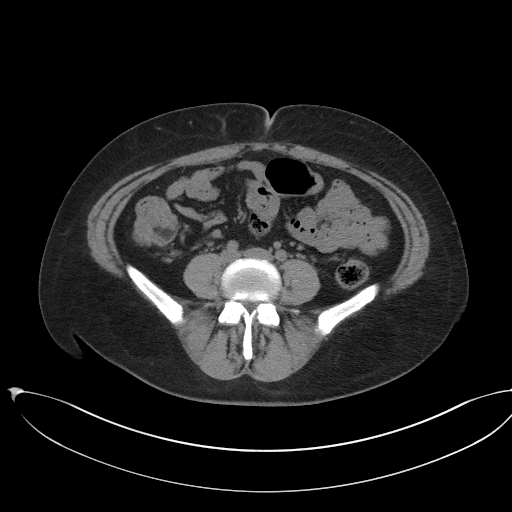
[im 52/100  soft-tissue]
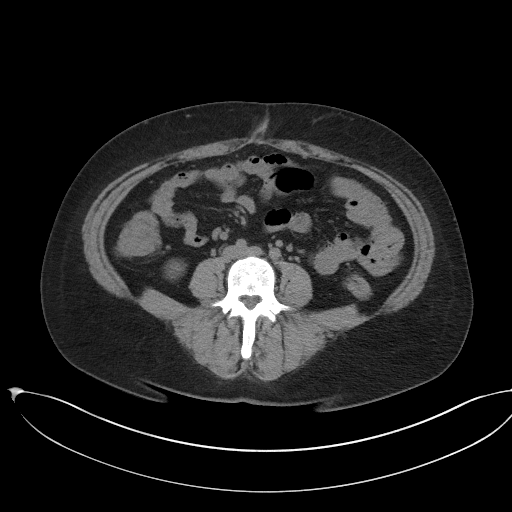
[im 60/100  soft-tissue]
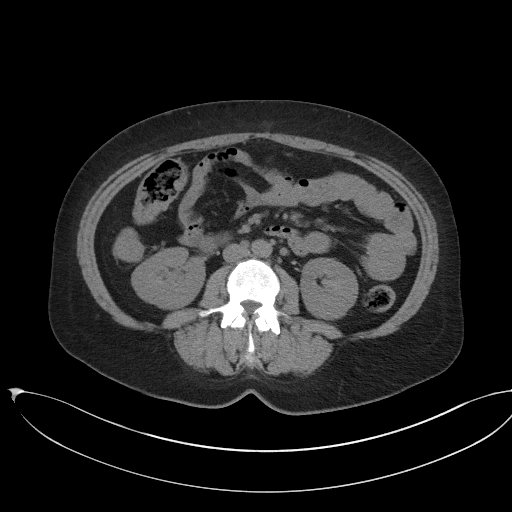
[im 60/100  bone]
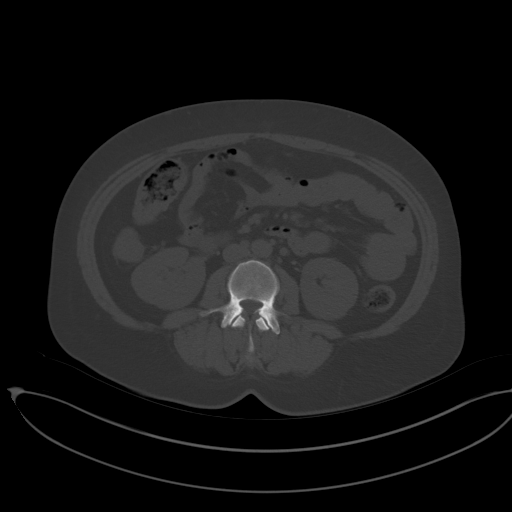
[im 68/100  soft-tissue]
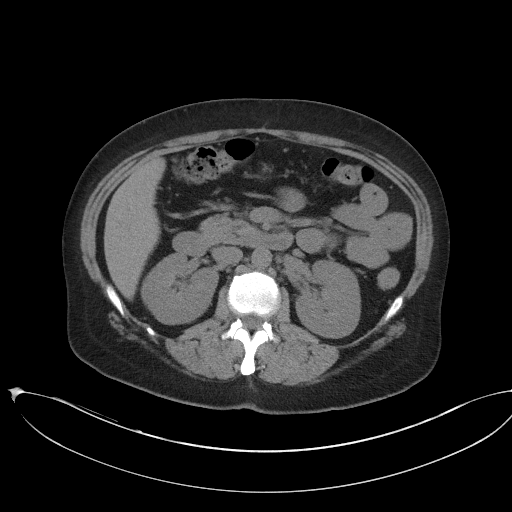
[im 76/100  soft-tissue]
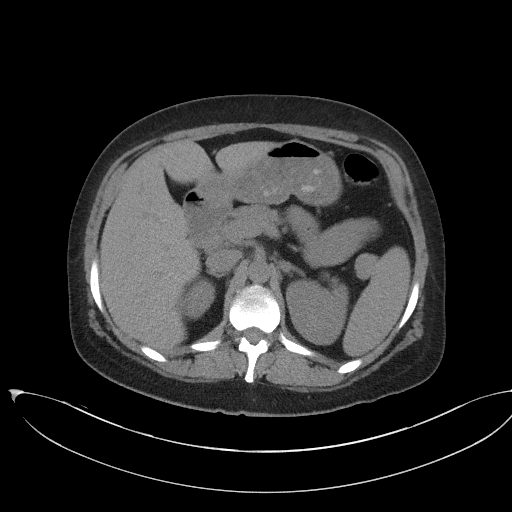
[im 80/100  soft-tissue]
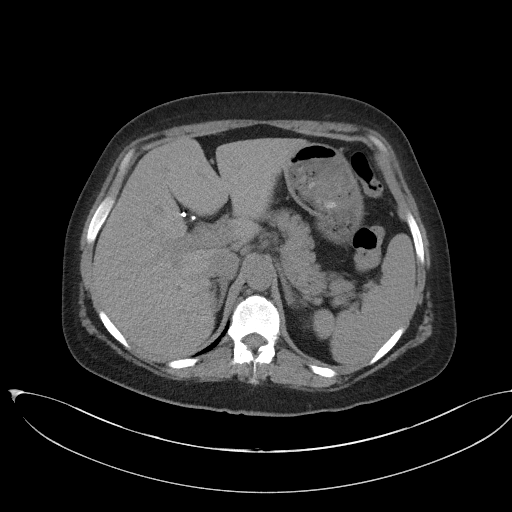
[im 88/100  soft-tissue]
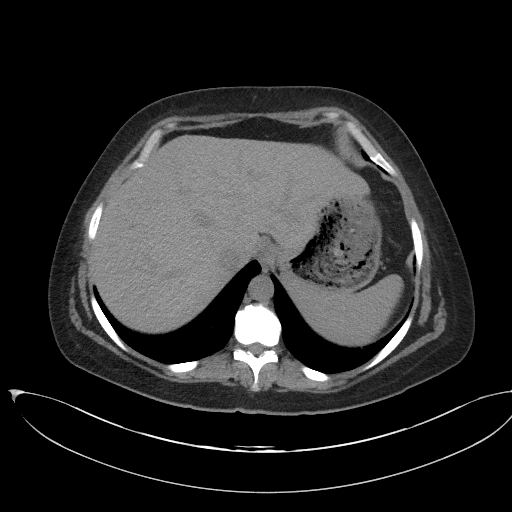
[im 96/100  soft-tissue]
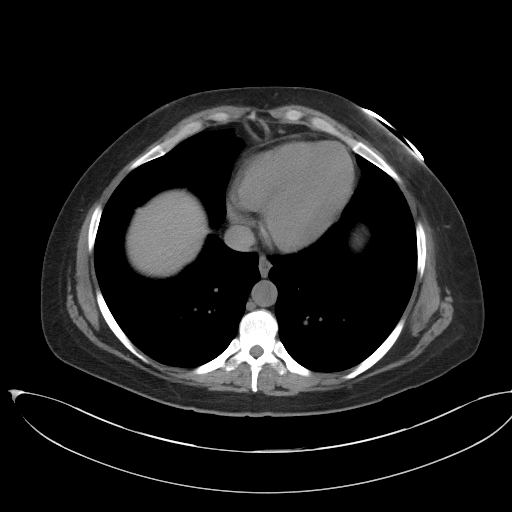

[Series 5: coronal st · coronal · 0.90mm/px · 3 of 100 slices shown]
[im 34/100  soft-tissue]
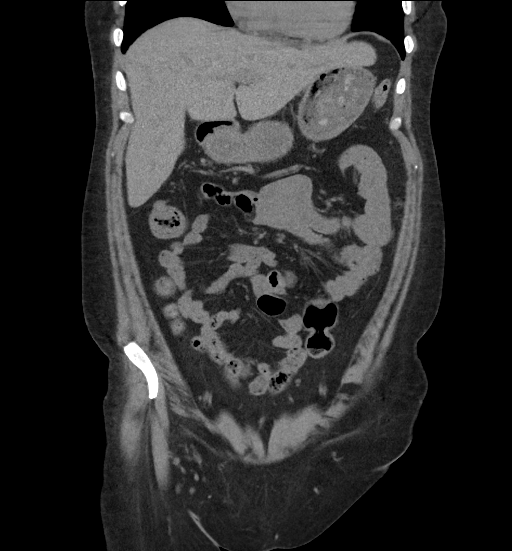
[im 45/100  soft-tissue]
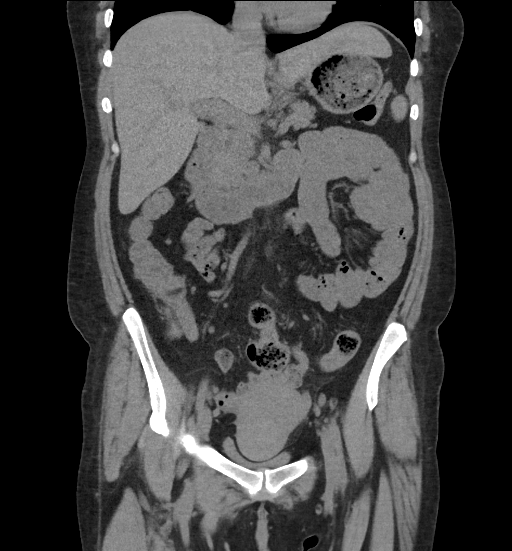
[im 56/100  soft-tissue]
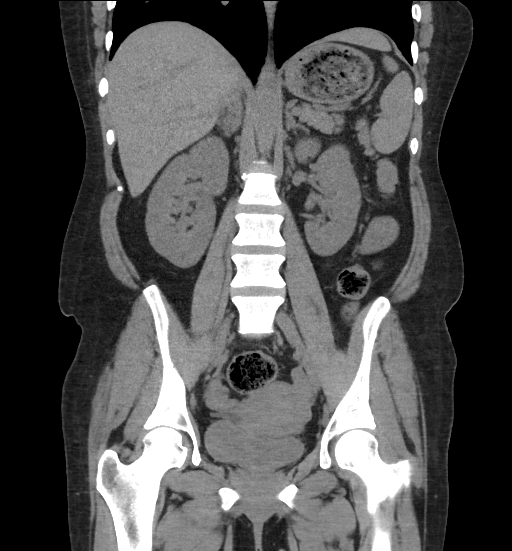

[17 of 46 positions shown; findings below may reference images not displayed]

FINDINGS: Lower chest: No acute abnormality.

Hepatobiliary: No focal liver abnormality is seen. Status post
cholecystectomy. No biliary dilatation.

Pancreas: Unremarkable. No pancreatic ductal dilatation or
surrounding inflammatory changes.

Spleen: Normal in size without focal abnormality.

Adrenals/Urinary Tract: Adrenal glands are unremarkable. Kidneys are
normal, without renal calculi, focal lesion, or hydronephrosis.
Bladder is unremarkable.

Stomach/Bowel: Stomach is within normal limits. Appendix appears
normal. No evidence of bowel wall thickening, distention, or
inflammatory changes.

Vascular/Lymphatic: No significant vascular findings are present. No
enlarged abdominal or pelvic lymph nodes.

Reproductive: There is a probable fibroid within the uterine fundus.
No large ovarian mass.

Other: No abdominal wall hernia or abnormality. No abdominopelvic
ascites.

Musculoskeletal: No acute or significant osseous findings.
IMPRESSION: 1. No acute intra-abdominal abnormality detected.
2. Fibroid uterus.
3. Status post cholecystectomy.
4. Normal appendix in the right lower quadrant.
5. No radiopaque obstructing kidney stones.

## 2020-08-22 DIAGNOSIS — M545 Low back pain: Secondary | ICD-10-CM | POA: Diagnosis not present

## 2020-08-22 DIAGNOSIS — F419 Anxiety disorder, unspecified: Secondary | ICD-10-CM | POA: Diagnosis not present

## 2020-08-22 DIAGNOSIS — E1165 Type 2 diabetes mellitus with hyperglycemia: Secondary | ICD-10-CM | POA: Diagnosis not present

## 2020-08-22 DIAGNOSIS — F17210 Nicotine dependence, cigarettes, uncomplicated: Secondary | ICD-10-CM | POA: Diagnosis not present

## 2020-08-22 DIAGNOSIS — Z79899 Other long term (current) drug therapy: Secondary | ICD-10-CM | POA: Diagnosis not present

## 2020-08-22 DIAGNOSIS — I10 Essential (primary) hypertension: Secondary | ICD-10-CM | POA: Diagnosis not present

## 2020-08-26 DIAGNOSIS — Z419 Encounter for procedure for purposes other than remedying health state, unspecified: Secondary | ICD-10-CM | POA: Diagnosis not present

## 2020-09-24 DIAGNOSIS — Z79899 Other long term (current) drug therapy: Secondary | ICD-10-CM | POA: Diagnosis not present

## 2020-09-24 DIAGNOSIS — G8929 Other chronic pain: Secondary | ICD-10-CM | POA: Diagnosis not present

## 2020-09-24 DIAGNOSIS — E119 Type 2 diabetes mellitus without complications: Secondary | ICD-10-CM | POA: Diagnosis not present

## 2020-09-24 DIAGNOSIS — M549 Dorsalgia, unspecified: Secondary | ICD-10-CM | POA: Diagnosis not present

## 2020-09-24 DIAGNOSIS — E7800 Pure hypercholesterolemia, unspecified: Secondary | ICD-10-CM | POA: Diagnosis not present

## 2020-09-24 DIAGNOSIS — F17210 Nicotine dependence, cigarettes, uncomplicated: Secondary | ICD-10-CM | POA: Diagnosis not present

## 2020-09-26 DIAGNOSIS — Z419 Encounter for procedure for purposes other than remedying health state, unspecified: Secondary | ICD-10-CM | POA: Diagnosis not present

## 2020-10-13 DIAGNOSIS — F17210 Nicotine dependence, cigarettes, uncomplicated: Secondary | ICD-10-CM | POA: Diagnosis not present

## 2020-10-13 DIAGNOSIS — I10 Essential (primary) hypertension: Secondary | ICD-10-CM | POA: Diagnosis not present

## 2020-10-13 DIAGNOSIS — E1165 Type 2 diabetes mellitus with hyperglycemia: Secondary | ICD-10-CM | POA: Diagnosis not present

## 2020-10-13 DIAGNOSIS — R350 Frequency of micturition: Secondary | ICD-10-CM | POA: Diagnosis not present

## 2020-10-26 DIAGNOSIS — Z419 Encounter for procedure for purposes other than remedying health state, unspecified: Secondary | ICD-10-CM | POA: Diagnosis not present

## 2020-11-01 DIAGNOSIS — R102 Pelvic and perineal pain: Secondary | ICD-10-CM | POA: Diagnosis not present

## 2020-11-01 DIAGNOSIS — D259 Leiomyoma of uterus, unspecified: Secondary | ICD-10-CM | POA: Diagnosis not present

## 2020-11-01 DIAGNOSIS — R1031 Right lower quadrant pain: Secondary | ICD-10-CM | POA: Diagnosis not present

## 2020-11-04 DIAGNOSIS — B20 Human immunodeficiency virus [HIV] disease: Secondary | ICD-10-CM | POA: Diagnosis not present

## 2020-11-07 DIAGNOSIS — N83291 Other ovarian cyst, right side: Secondary | ICD-10-CM | POA: Diagnosis not present

## 2020-11-07 DIAGNOSIS — D259 Leiomyoma of uterus, unspecified: Secondary | ICD-10-CM | POA: Diagnosis not present

## 2020-11-07 DIAGNOSIS — N83201 Unspecified ovarian cyst, right side: Secondary | ICD-10-CM | POA: Diagnosis not present

## 2020-11-08 DIAGNOSIS — I10 Essential (primary) hypertension: Secondary | ICD-10-CM | POA: Diagnosis not present

## 2020-11-08 DIAGNOSIS — F17210 Nicotine dependence, cigarettes, uncomplicated: Secondary | ICD-10-CM | POA: Diagnosis not present

## 2020-11-08 DIAGNOSIS — E119 Type 2 diabetes mellitus without complications: Secondary | ICD-10-CM | POA: Diagnosis not present

## 2020-11-08 DIAGNOSIS — Z79899 Other long term (current) drug therapy: Secondary | ICD-10-CM | POA: Diagnosis not present

## 2020-11-08 DIAGNOSIS — F419 Anxiety disorder, unspecified: Secondary | ICD-10-CM | POA: Diagnosis not present

## 2020-11-15 DIAGNOSIS — E1165 Type 2 diabetes mellitus with hyperglycemia: Secondary | ICD-10-CM | POA: Diagnosis not present

## 2020-11-15 DIAGNOSIS — E1142 Type 2 diabetes mellitus with diabetic polyneuropathy: Secondary | ICD-10-CM | POA: Diagnosis not present

## 2020-11-15 DIAGNOSIS — L608 Other nail disorders: Secondary | ICD-10-CM | POA: Diagnosis not present

## 2020-11-16 DIAGNOSIS — R600 Localized edema: Secondary | ICD-10-CM | POA: Diagnosis not present

## 2020-11-22 ENCOUNTER — Encounter (HOSPITAL_BASED_OUTPATIENT_CLINIC_OR_DEPARTMENT_OTHER): Payer: Self-pay | Admitting: *Deleted

## 2020-11-22 ENCOUNTER — Other Ambulatory Visit: Payer: Self-pay

## 2020-11-22 ENCOUNTER — Emergency Department (HOSPITAL_BASED_OUTPATIENT_CLINIC_OR_DEPARTMENT_OTHER)
Admission: EM | Admit: 2020-11-22 | Discharge: 2020-11-22 | Disposition: A | Discharge status: Home / Self Care | Payer: Medicaid Other | Attending: Emergency Medicine | Admitting: Emergency Medicine

## 2020-11-22 DIAGNOSIS — Z5321 Procedure and treatment not carried out due to patient leaving prior to being seen by health care provider: Secondary | ICD-10-CM | POA: Diagnosis not present

## 2020-11-22 DIAGNOSIS — M7989 Other specified soft tissue disorders: Secondary | ICD-10-CM | POA: Insufficient documentation

## 2020-11-22 NOTE — ED Notes (Signed)
Pt called 3 times for room, did not reply

## 2020-11-22 NOTE — ED Triage Notes (Signed)
Right leg swelling x 4 days. Denies injury. No relief with elevation.

## 2020-11-26 DIAGNOSIS — Z419 Encounter for procedure for purposes other than remedying health state, unspecified: Secondary | ICD-10-CM | POA: Diagnosis not present

## 2020-11-28 DIAGNOSIS — Z79899 Other long term (current) drug therapy: Secondary | ICD-10-CM | POA: Diagnosis not present

## 2020-11-28 DIAGNOSIS — F419 Anxiety disorder, unspecified: Secondary | ICD-10-CM | POA: Diagnosis not present

## 2020-11-28 DIAGNOSIS — E119 Type 2 diabetes mellitus without complications: Secondary | ICD-10-CM | POA: Diagnosis not present

## 2020-11-28 DIAGNOSIS — F32A Depression, unspecified: Secondary | ICD-10-CM | POA: Diagnosis not present

## 2020-11-28 DIAGNOSIS — I10 Essential (primary) hypertension: Secondary | ICD-10-CM | POA: Diagnosis not present

## 2020-11-28 DIAGNOSIS — F17210 Nicotine dependence, cigarettes, uncomplicated: Secondary | ICD-10-CM | POA: Diagnosis not present

## 2020-12-27 DIAGNOSIS — Z419 Encounter for procedure for purposes other than remedying health state, unspecified: Secondary | ICD-10-CM | POA: Diagnosis not present

## 2020-12-28 DIAGNOSIS — F419 Anxiety disorder, unspecified: Secondary | ICD-10-CM | POA: Diagnosis not present

## 2020-12-28 DIAGNOSIS — B20 Human immunodeficiency virus [HIV] disease: Secondary | ICD-10-CM | POA: Diagnosis not present

## 2020-12-28 DIAGNOSIS — I10 Essential (primary) hypertension: Secondary | ICD-10-CM | POA: Diagnosis not present

## 2020-12-28 DIAGNOSIS — J019 Acute sinusitis, unspecified: Secondary | ICD-10-CM | POA: Diagnosis not present

## 2020-12-28 DIAGNOSIS — Z20822 Contact with and (suspected) exposure to covid-19: Secondary | ICD-10-CM | POA: Diagnosis not present

## 2020-12-28 DIAGNOSIS — E109 Type 1 diabetes mellitus without complications: Secondary | ICD-10-CM | POA: Diagnosis not present

## 2021-01-05 DIAGNOSIS — F17210 Nicotine dependence, cigarettes, uncomplicated: Secondary | ICD-10-CM | POA: Diagnosis not present

## 2021-01-05 DIAGNOSIS — R739 Hyperglycemia, unspecified: Secondary | ICD-10-CM | POA: Diagnosis not present

## 2021-01-05 DIAGNOSIS — E119 Type 2 diabetes mellitus without complications: Secondary | ICD-10-CM | POA: Diagnosis not present

## 2021-01-05 DIAGNOSIS — F32A Depression, unspecified: Secondary | ICD-10-CM | POA: Diagnosis not present

## 2021-01-05 DIAGNOSIS — Z79899 Other long term (current) drug therapy: Secondary | ICD-10-CM | POA: Diagnosis not present

## 2021-01-05 DIAGNOSIS — I10 Essential (primary) hypertension: Secondary | ICD-10-CM | POA: Diagnosis not present

## 2021-01-05 DIAGNOSIS — F419 Anxiety disorder, unspecified: Secondary | ICD-10-CM | POA: Diagnosis not present

## 2021-01-05 DIAGNOSIS — R059 Cough, unspecified: Secondary | ICD-10-CM | POA: Diagnosis not present

## 2021-01-24 DIAGNOSIS — Z419 Encounter for procedure for purposes other than remedying health state, unspecified: Secondary | ICD-10-CM | POA: Diagnosis not present

## 2021-02-08 DIAGNOSIS — F419 Anxiety disorder, unspecified: Secondary | ICD-10-CM | POA: Diagnosis not present

## 2021-02-08 DIAGNOSIS — E119 Type 2 diabetes mellitus without complications: Secondary | ICD-10-CM | POA: Diagnosis not present

## 2021-02-08 DIAGNOSIS — I10 Essential (primary) hypertension: Secondary | ICD-10-CM | POA: Diagnosis not present

## 2021-02-08 DIAGNOSIS — Z79899 Other long term (current) drug therapy: Secondary | ICD-10-CM | POA: Diagnosis not present

## 2021-02-08 DIAGNOSIS — F32A Depression, unspecified: Secondary | ICD-10-CM | POA: Diagnosis not present

## 2021-02-10 DIAGNOSIS — I10 Essential (primary) hypertension: Secondary | ICD-10-CM | POA: Diagnosis not present

## 2021-02-10 DIAGNOSIS — M7989 Other specified soft tissue disorders: Secondary | ICD-10-CM | POA: Diagnosis not present

## 2021-02-11 DIAGNOSIS — R197 Diarrhea, unspecified: Secondary | ICD-10-CM | POA: Diagnosis not present

## 2021-02-11 DIAGNOSIS — R1030 Lower abdominal pain, unspecified: Secondary | ICD-10-CM | POA: Diagnosis not present

## 2021-02-11 DIAGNOSIS — K219 Gastro-esophageal reflux disease without esophagitis: Secondary | ICD-10-CM | POA: Diagnosis not present

## 2021-02-13 DIAGNOSIS — R197 Diarrhea, unspecified: Secondary | ICD-10-CM | POA: Diagnosis not present

## 2021-02-20 DIAGNOSIS — E119 Type 2 diabetes mellitus without complications: Secondary | ICD-10-CM | POA: Diagnosis not present

## 2021-02-20 DIAGNOSIS — K219 Gastro-esophageal reflux disease without esophagitis: Secondary | ICD-10-CM | POA: Diagnosis not present

## 2021-02-20 DIAGNOSIS — Z01818 Encounter for other preprocedural examination: Secondary | ICD-10-CM | POA: Diagnosis not present

## 2021-02-20 DIAGNOSIS — Z1211 Encounter for screening for malignant neoplasm of colon: Secondary | ICD-10-CM | POA: Diagnosis not present

## 2021-02-23 DIAGNOSIS — E1165 Type 2 diabetes mellitus with hyperglycemia: Secondary | ICD-10-CM | POA: Diagnosis not present

## 2021-02-23 DIAGNOSIS — F32A Depression, unspecified: Secondary | ICD-10-CM | POA: Diagnosis not present

## 2021-02-23 DIAGNOSIS — F419 Anxiety disorder, unspecified: Secondary | ICD-10-CM | POA: Diagnosis not present

## 2021-02-23 DIAGNOSIS — I10 Essential (primary) hypertension: Secondary | ICD-10-CM | POA: Diagnosis not present

## 2021-02-23 DIAGNOSIS — Z79899 Other long term (current) drug therapy: Secondary | ICD-10-CM | POA: Diagnosis not present

## 2021-02-24 DIAGNOSIS — Z419 Encounter for procedure for purposes other than remedying health state, unspecified: Secondary | ICD-10-CM | POA: Diagnosis not present

## 2021-02-24 DIAGNOSIS — R1030 Lower abdominal pain, unspecified: Secondary | ICD-10-CM | POA: Diagnosis not present

## 2021-02-27 DIAGNOSIS — K900 Celiac disease: Secondary | ICD-10-CM | POA: Diagnosis not present

## 2021-02-27 DIAGNOSIS — K2970 Gastritis, unspecified, without bleeding: Secondary | ICD-10-CM | POA: Diagnosis not present

## 2021-02-27 DIAGNOSIS — A048 Other specified bacterial intestinal infections: Secondary | ICD-10-CM | POA: Diagnosis not present

## 2021-02-27 DIAGNOSIS — K2270 Barrett's esophagus without dysplasia: Secondary | ICD-10-CM | POA: Diagnosis not present

## 2021-03-09 DIAGNOSIS — I10 Essential (primary) hypertension: Secondary | ICD-10-CM | POA: Diagnosis not present

## 2021-03-09 DIAGNOSIS — Z79899 Other long term (current) drug therapy: Secondary | ICD-10-CM | POA: Diagnosis not present

## 2021-03-09 DIAGNOSIS — F419 Anxiety disorder, unspecified: Secondary | ICD-10-CM | POA: Diagnosis not present

## 2021-03-09 DIAGNOSIS — F32A Depression, unspecified: Secondary | ICD-10-CM | POA: Diagnosis not present

## 2021-03-09 DIAGNOSIS — E1165 Type 2 diabetes mellitus with hyperglycemia: Secondary | ICD-10-CM | POA: Diagnosis not present

## 2021-03-26 DIAGNOSIS — Z419 Encounter for procedure for purposes other than remedying health state, unspecified: Secondary | ICD-10-CM | POA: Diagnosis not present

## 2021-04-18 DIAGNOSIS — R600 Localized edema: Secondary | ICD-10-CM | POA: Diagnosis not present

## 2021-04-18 DIAGNOSIS — E1165 Type 2 diabetes mellitus with hyperglycemia: Secondary | ICD-10-CM | POA: Diagnosis not present

## 2021-04-18 DIAGNOSIS — I10 Essential (primary) hypertension: Secondary | ICD-10-CM | POA: Diagnosis not present

## 2021-04-19 DIAGNOSIS — Z6828 Body mass index (BMI) 28.0-28.9, adult: Secondary | ICD-10-CM | POA: Diagnosis not present

## 2021-04-19 DIAGNOSIS — R5383 Other fatigue: Secondary | ICD-10-CM | POA: Diagnosis not present

## 2021-04-19 DIAGNOSIS — Z20822 Contact with and (suspected) exposure to covid-19: Secondary | ICD-10-CM | POA: Diagnosis not present

## 2021-04-19 DIAGNOSIS — Z0000 Encounter for general adult medical examination without abnormal findings: Secondary | ICD-10-CM | POA: Diagnosis not present

## 2021-04-19 DIAGNOSIS — Z114 Encounter for screening for human immunodeficiency virus [HIV]: Secondary | ICD-10-CM | POA: Diagnosis not present

## 2021-04-19 DIAGNOSIS — R030 Elevated blood-pressure reading, without diagnosis of hypertension: Secondary | ICD-10-CM | POA: Diagnosis not present

## 2021-04-19 DIAGNOSIS — F17210 Nicotine dependence, cigarettes, uncomplicated: Secondary | ICD-10-CM | POA: Diagnosis not present

## 2021-04-19 DIAGNOSIS — Z79899 Other long term (current) drug therapy: Secondary | ICD-10-CM | POA: Diagnosis not present

## 2021-04-19 DIAGNOSIS — R0602 Shortness of breath: Secondary | ICD-10-CM | POA: Diagnosis not present

## 2021-04-19 DIAGNOSIS — Z131 Encounter for screening for diabetes mellitus: Secondary | ICD-10-CM | POA: Diagnosis not present

## 2021-04-19 DIAGNOSIS — E559 Vitamin D deficiency, unspecified: Secondary | ICD-10-CM | POA: Diagnosis not present

## 2021-04-19 DIAGNOSIS — E1165 Type 2 diabetes mellitus with hyperglycemia: Secondary | ICD-10-CM | POA: Diagnosis not present

## 2021-04-19 DIAGNOSIS — Z13220 Encounter for screening for lipoid disorders: Secondary | ICD-10-CM | POA: Diagnosis not present

## 2021-04-19 DIAGNOSIS — R600 Localized edema: Secondary | ICD-10-CM | POA: Diagnosis not present

## 2021-04-20 DIAGNOSIS — M7989 Other specified soft tissue disorders: Secondary | ICD-10-CM | POA: Diagnosis not present

## 2021-04-20 DIAGNOSIS — R0602 Shortness of breath: Secondary | ICD-10-CM | POA: Diagnosis not present

## 2021-04-20 DIAGNOSIS — R0789 Other chest pain: Secondary | ICD-10-CM | POA: Diagnosis not present

## 2021-04-21 DIAGNOSIS — I10 Essential (primary) hypertension: Secondary | ICD-10-CM | POA: Diagnosis not present

## 2021-04-21 DIAGNOSIS — R0602 Shortness of breath: Secondary | ICD-10-CM | POA: Diagnosis not present

## 2021-04-23 DIAGNOSIS — R21 Rash and other nonspecific skin eruption: Secondary | ICD-10-CM | POA: Diagnosis not present

## 2021-04-24 DIAGNOSIS — F4310 Post-traumatic stress disorder, unspecified: Secondary | ICD-10-CM | POA: Diagnosis not present

## 2021-04-24 DIAGNOSIS — F411 Generalized anxiety disorder: Secondary | ICD-10-CM | POA: Diagnosis not present

## 2021-04-24 DIAGNOSIS — F331 Major depressive disorder, recurrent, moderate: Secondary | ICD-10-CM | POA: Diagnosis not present

## 2021-04-24 DIAGNOSIS — F410 Panic disorder [episodic paroxysmal anxiety] without agoraphobia: Secondary | ICD-10-CM | POA: Diagnosis not present

## 2021-04-26 DIAGNOSIS — Z419 Encounter for procedure for purposes other than remedying health state, unspecified: Secondary | ICD-10-CM | POA: Diagnosis not present

## 2021-04-28 DIAGNOSIS — F419 Anxiety disorder, unspecified: Secondary | ICD-10-CM | POA: Diagnosis not present

## 2021-04-28 DIAGNOSIS — R0602 Shortness of breath: Secondary | ICD-10-CM | POA: Diagnosis not present

## 2021-04-28 DIAGNOSIS — F17210 Nicotine dependence, cigarettes, uncomplicated: Secondary | ICD-10-CM | POA: Diagnosis not present

## 2021-04-28 DIAGNOSIS — J302 Other seasonal allergic rhinitis: Secondary | ICD-10-CM | POA: Diagnosis not present

## 2021-05-01 DIAGNOSIS — R945 Abnormal results of liver function studies: Secondary | ICD-10-CM | POA: Diagnosis not present

## 2021-05-01 DIAGNOSIS — E1165 Type 2 diabetes mellitus with hyperglycemia: Secondary | ICD-10-CM | POA: Diagnosis not present

## 2021-05-01 DIAGNOSIS — E7800 Pure hypercholesterolemia, unspecified: Secondary | ICD-10-CM | POA: Diagnosis not present

## 2021-05-01 DIAGNOSIS — R600 Localized edema: Secondary | ICD-10-CM | POA: Diagnosis not present

## 2021-05-01 DIAGNOSIS — F17210 Nicotine dependence, cigarettes, uncomplicated: Secondary | ICD-10-CM | POA: Diagnosis not present

## 2021-05-05 DIAGNOSIS — B20 Human immunodeficiency virus [HIV] disease: Secondary | ICD-10-CM | POA: Diagnosis not present

## 2021-05-08 ENCOUNTER — Other Ambulatory Visit: Payer: Self-pay | Admitting: Family

## 2021-05-08 DIAGNOSIS — B20 Human immunodeficiency virus [HIV] disease: Secondary | ICD-10-CM

## 2021-05-12 DIAGNOSIS — R945 Abnormal results of liver function studies: Secondary | ICD-10-CM | POA: Diagnosis not present

## 2021-05-12 DIAGNOSIS — R590 Localized enlarged lymph nodes: Secondary | ICD-10-CM | POA: Diagnosis not present

## 2021-05-12 DIAGNOSIS — R600 Localized edema: Secondary | ICD-10-CM | POA: Diagnosis not present

## 2021-05-24 DIAGNOSIS — I10 Essential (primary) hypertension: Secondary | ICD-10-CM | POA: Diagnosis not present

## 2021-05-24 DIAGNOSIS — R609 Edema, unspecified: Secondary | ICD-10-CM | POA: Diagnosis not present

## 2021-05-25 DIAGNOSIS — B20 Human immunodeficiency virus [HIV] disease: Secondary | ICD-10-CM | POA: Diagnosis not present

## 2021-05-25 DIAGNOSIS — M329 Systemic lupus erythematosus, unspecified: Secondary | ICD-10-CM | POA: Diagnosis not present

## 2021-05-25 DIAGNOSIS — F17210 Nicotine dependence, cigarettes, uncomplicated: Secondary | ICD-10-CM | POA: Diagnosis not present

## 2021-05-25 DIAGNOSIS — E7800 Pure hypercholesterolemia, unspecified: Secondary | ICD-10-CM | POA: Diagnosis not present

## 2021-05-25 DIAGNOSIS — R770 Abnormality of albumin: Secondary | ICD-10-CM | POA: Diagnosis not present

## 2021-05-25 DIAGNOSIS — E1165 Type 2 diabetes mellitus with hyperglycemia: Secondary | ICD-10-CM | POA: Diagnosis not present

## 2021-05-26 DIAGNOSIS — Z419 Encounter for procedure for purposes other than remedying health state, unspecified: Secondary | ICD-10-CM | POA: Diagnosis not present

## 2021-06-05 DIAGNOSIS — N83209 Unspecified ovarian cyst, unspecified side: Secondary | ICD-10-CM | POA: Diagnosis not present

## 2021-06-05 DIAGNOSIS — Z8742 Personal history of other diseases of the female genital tract: Secondary | ICD-10-CM | POA: Diagnosis not present

## 2021-06-05 DIAGNOSIS — D252 Subserosal leiomyoma of uterus: Secondary | ICD-10-CM | POA: Diagnosis not present

## 2021-06-05 DIAGNOSIS — R1032 Left lower quadrant pain: Secondary | ICD-10-CM | POA: Diagnosis not present

## 2021-06-05 DIAGNOSIS — D259 Leiomyoma of uterus, unspecified: Secondary | ICD-10-CM | POA: Diagnosis not present

## 2021-06-26 DIAGNOSIS — Z419 Encounter for procedure for purposes other than remedying health state, unspecified: Secondary | ICD-10-CM | POA: Diagnosis not present

## 2021-07-20 DIAGNOSIS — R87610 Atypical squamous cells of undetermined significance on cytologic smear of cervix (ASC-US): Secondary | ICD-10-CM | POA: Diagnosis not present

## 2021-07-20 DIAGNOSIS — Z8742 Personal history of other diseases of the female genital tract: Secondary | ICD-10-CM | POA: Diagnosis not present

## 2021-07-20 DIAGNOSIS — A630 Anogenital (venereal) warts: Secondary | ICD-10-CM | POA: Diagnosis not present

## 2021-07-20 DIAGNOSIS — I10 Essential (primary) hypertension: Secondary | ICD-10-CM | POA: Diagnosis not present

## 2021-07-20 DIAGNOSIS — Z124 Encounter for screening for malignant neoplasm of cervix: Secondary | ICD-10-CM | POA: Diagnosis not present

## 2021-07-20 DIAGNOSIS — F17210 Nicotine dependence, cigarettes, uncomplicated: Secondary | ICD-10-CM | POA: Diagnosis not present

## 2021-07-20 DIAGNOSIS — R102 Pelvic and perineal pain: Secondary | ICD-10-CM | POA: Diagnosis not present

## 2021-07-20 DIAGNOSIS — Z1151 Encounter for screening for human papillomavirus (HPV): Secondary | ICD-10-CM | POA: Diagnosis not present

## 2021-07-20 DIAGNOSIS — N920 Excessive and frequent menstruation with regular cycle: Secondary | ICD-10-CM | POA: Diagnosis not present

## 2021-07-20 DIAGNOSIS — N921 Excessive and frequent menstruation with irregular cycle: Secondary | ICD-10-CM | POA: Diagnosis not present

## 2021-07-20 DIAGNOSIS — D259 Leiomyoma of uterus, unspecified: Secondary | ICD-10-CM | POA: Diagnosis not present

## 2021-07-24 DIAGNOSIS — R102 Pelvic and perineal pain: Secondary | ICD-10-CM | POA: Diagnosis not present

## 2021-07-24 DIAGNOSIS — N858 Other specified noninflammatory disorders of uterus: Secondary | ICD-10-CM | POA: Diagnosis not present

## 2021-07-27 DIAGNOSIS — Z419 Encounter for procedure for purposes other than remedying health state, unspecified: Secondary | ICD-10-CM | POA: Diagnosis not present

## 2021-08-01 DIAGNOSIS — A630 Anogenital (venereal) warts: Secondary | ICD-10-CM | POA: Diagnosis not present

## 2021-08-01 DIAGNOSIS — E119 Type 2 diabetes mellitus without complications: Secondary | ICD-10-CM | POA: Diagnosis not present

## 2021-08-01 DIAGNOSIS — E785 Hyperlipidemia, unspecified: Secondary | ICD-10-CM | POA: Diagnosis not present

## 2021-08-03 DIAGNOSIS — A630 Anogenital (venereal) warts: Secondary | ICD-10-CM | POA: Insufficient documentation

## 2021-08-08 DIAGNOSIS — Z79899 Other long term (current) drug therapy: Secondary | ICD-10-CM | POA: Diagnosis not present

## 2021-08-08 DIAGNOSIS — F4310 Post-traumatic stress disorder, unspecified: Secondary | ICD-10-CM | POA: Diagnosis not present

## 2021-08-08 DIAGNOSIS — F411 Generalized anxiety disorder: Secondary | ICD-10-CM | POA: Diagnosis not present

## 2021-08-08 DIAGNOSIS — F331 Major depressive disorder, recurrent, moderate: Secondary | ICD-10-CM | POA: Diagnosis not present

## 2021-08-10 DIAGNOSIS — Z79899 Other long term (current) drug therapy: Secondary | ICD-10-CM | POA: Diagnosis not present

## 2021-08-15 DIAGNOSIS — M542 Cervicalgia: Secondary | ICD-10-CM | POA: Diagnosis not present

## 2021-08-15 DIAGNOSIS — E876 Hypokalemia: Secondary | ICD-10-CM | POA: Diagnosis not present

## 2021-08-15 DIAGNOSIS — E1165 Type 2 diabetes mellitus with hyperglycemia: Secondary | ICD-10-CM | POA: Diagnosis not present

## 2021-08-15 DIAGNOSIS — I10 Essential (primary) hypertension: Secondary | ICD-10-CM | POA: Diagnosis not present

## 2021-08-17 DIAGNOSIS — N921 Excessive and frequent menstruation with irregular cycle: Secondary | ICD-10-CM | POA: Diagnosis not present

## 2021-08-17 DIAGNOSIS — N946 Dysmenorrhea, unspecified: Secondary | ICD-10-CM | POA: Diagnosis not present

## 2021-08-17 DIAGNOSIS — Z21 Asymptomatic human immunodeficiency virus [HIV] infection status: Secondary | ICD-10-CM | POA: Diagnosis not present

## 2021-08-17 DIAGNOSIS — E1165 Type 2 diabetes mellitus with hyperglycemia: Secondary | ICD-10-CM | POA: Diagnosis not present

## 2021-08-17 DIAGNOSIS — R102 Pelvic and perineal pain: Secondary | ICD-10-CM | POA: Diagnosis not present

## 2021-08-17 DIAGNOSIS — A630 Anogenital (venereal) warts: Secondary | ICD-10-CM | POA: Diagnosis not present

## 2021-08-17 DIAGNOSIS — N611 Abscess of the breast and nipple: Secondary | ICD-10-CM | POA: Diagnosis not present

## 2021-08-24 DIAGNOSIS — D219 Benign neoplasm of connective and other soft tissue, unspecified: Secondary | ICD-10-CM | POA: Insufficient documentation

## 2021-08-24 DIAGNOSIS — N938 Other specified abnormal uterine and vaginal bleeding: Secondary | ICD-10-CM | POA: Insufficient documentation

## 2021-08-26 DIAGNOSIS — Z419 Encounter for procedure for purposes other than remedying health state, unspecified: Secondary | ICD-10-CM | POA: Diagnosis not present

## 2021-09-05 DIAGNOSIS — N611 Abscess of the breast and nipple: Secondary | ICD-10-CM | POA: Diagnosis not present

## 2021-09-05 DIAGNOSIS — R922 Inconclusive mammogram: Secondary | ICD-10-CM | POA: Diagnosis not present

## 2021-09-06 DIAGNOSIS — N611 Abscess of the breast and nipple: Secondary | ICD-10-CM | POA: Diagnosis not present

## 2021-09-12 DIAGNOSIS — J45901 Unspecified asthma with (acute) exacerbation: Secondary | ICD-10-CM | POA: Diagnosis not present

## 2021-09-12 DIAGNOSIS — J45909 Unspecified asthma, uncomplicated: Secondary | ICD-10-CM | POA: Diagnosis not present

## 2021-09-12 DIAGNOSIS — J9601 Acute respiratory failure with hypoxia: Secondary | ICD-10-CM | POA: Diagnosis not present

## 2021-09-12 DIAGNOSIS — R0600 Dyspnea, unspecified: Secondary | ICD-10-CM | POA: Diagnosis not present

## 2021-09-12 DIAGNOSIS — Z20822 Contact with and (suspected) exposure to covid-19: Secondary | ICD-10-CM | POA: Diagnosis not present

## 2021-09-13 DIAGNOSIS — N179 Acute kidney failure, unspecified: Secondary | ICD-10-CM | POA: Diagnosis not present

## 2021-09-13 DIAGNOSIS — J45909 Unspecified asthma, uncomplicated: Secondary | ICD-10-CM | POA: Diagnosis not present

## 2021-09-13 DIAGNOSIS — F17210 Nicotine dependence, cigarettes, uncomplicated: Secondary | ICD-10-CM | POA: Diagnosis not present

## 2021-09-13 DIAGNOSIS — K219 Gastro-esophageal reflux disease without esophagitis: Secondary | ICD-10-CM | POA: Diagnosis not present

## 2021-09-13 DIAGNOSIS — G629 Polyneuropathy, unspecified: Secondary | ICD-10-CM | POA: Diagnosis not present

## 2021-09-13 DIAGNOSIS — J122 Parainfluenza virus pneumonia: Secondary | ICD-10-CM | POA: Diagnosis not present

## 2021-09-13 DIAGNOSIS — E785 Hyperlipidemia, unspecified: Secondary | ICD-10-CM | POA: Diagnosis not present

## 2021-09-13 DIAGNOSIS — B20 Human immunodeficiency virus [HIV] disease: Secondary | ICD-10-CM | POA: Diagnosis not present

## 2021-09-13 DIAGNOSIS — J9601 Acute respiratory failure with hypoxia: Secondary | ICD-10-CM | POA: Diagnosis not present

## 2021-09-13 DIAGNOSIS — J45901 Unspecified asthma with (acute) exacerbation: Secondary | ICD-10-CM | POA: Diagnosis not present

## 2021-09-13 DIAGNOSIS — Z20822 Contact with and (suspected) exposure to covid-19: Secondary | ICD-10-CM | POA: Diagnosis not present

## 2021-09-13 DIAGNOSIS — E119 Type 2 diabetes mellitus without complications: Secondary | ICD-10-CM | POA: Diagnosis not present

## 2021-09-13 DIAGNOSIS — I10 Essential (primary) hypertension: Secondary | ICD-10-CM | POA: Diagnosis not present

## 2021-09-13 DIAGNOSIS — T380X5A Adverse effect of glucocorticoids and synthetic analogues, initial encounter: Secondary | ICD-10-CM | POA: Diagnosis not present

## 2021-09-13 DIAGNOSIS — R519 Headache, unspecified: Secondary | ICD-10-CM | POA: Diagnosis not present

## 2021-09-13 DIAGNOSIS — J449 Chronic obstructive pulmonary disease, unspecified: Secondary | ICD-10-CM | POA: Diagnosis not present

## 2021-09-13 DIAGNOSIS — Z21 Asymptomatic human immunodeficiency virus [HIV] infection status: Secondary | ICD-10-CM | POA: Diagnosis not present

## 2021-09-13 DIAGNOSIS — F419 Anxiety disorder, unspecified: Secondary | ICD-10-CM | POA: Diagnosis not present

## 2021-09-13 DIAGNOSIS — E1142 Type 2 diabetes mellitus with diabetic polyneuropathy: Secondary | ICD-10-CM | POA: Diagnosis not present

## 2021-09-13 DIAGNOSIS — E1165 Type 2 diabetes mellitus with hyperglycemia: Secondary | ICD-10-CM | POA: Diagnosis not present

## 2021-09-13 DIAGNOSIS — B348 Other viral infections of unspecified site: Secondary | ICD-10-CM | POA: Diagnosis not present

## 2021-09-13 DIAGNOSIS — R0600 Dyspnea, unspecified: Secondary | ICD-10-CM | POA: Diagnosis not present

## 2021-09-14 DIAGNOSIS — R059 Cough, unspecified: Secondary | ICD-10-CM | POA: Diagnosis not present

## 2021-09-14 DIAGNOSIS — R000 Tachycardia, unspecified: Secondary | ICD-10-CM | POA: Diagnosis not present

## 2021-09-14 DIAGNOSIS — R0602 Shortness of breath: Secondary | ICD-10-CM | POA: Diagnosis not present

## 2021-09-17 DIAGNOSIS — J45901 Unspecified asthma with (acute) exacerbation: Secondary | ICD-10-CM | POA: Diagnosis not present

## 2021-09-17 DIAGNOSIS — I10 Essential (primary) hypertension: Secondary | ICD-10-CM | POA: Diagnosis not present

## 2021-09-17 DIAGNOSIS — E0965 Drug or chemical induced diabetes mellitus with hyperglycemia: Secondary | ICD-10-CM | POA: Diagnosis not present

## 2021-09-18 DIAGNOSIS — E119 Type 2 diabetes mellitus without complications: Secondary | ICD-10-CM | POA: Diagnosis not present

## 2021-09-18 DIAGNOSIS — J449 Chronic obstructive pulmonary disease, unspecified: Secondary | ICD-10-CM | POA: Diagnosis not present

## 2021-09-18 DIAGNOSIS — R609 Edema, unspecified: Secondary | ICD-10-CM | POA: Diagnosis not present

## 2021-09-18 DIAGNOSIS — J159 Unspecified bacterial pneumonia: Secondary | ICD-10-CM | POA: Diagnosis not present

## 2021-09-25 DIAGNOSIS — J101 Influenza due to other identified influenza virus with other respiratory manifestations: Secondary | ICD-10-CM | POA: Diagnosis not present

## 2021-09-25 DIAGNOSIS — Z20822 Contact with and (suspected) exposure to covid-19: Secondary | ICD-10-CM | POA: Diagnosis not present

## 2021-09-26 DIAGNOSIS — Z419 Encounter for procedure for purposes other than remedying health state, unspecified: Secondary | ICD-10-CM | POA: Diagnosis not present

## 2021-10-23 DIAGNOSIS — F411 Generalized anxiety disorder: Secondary | ICD-10-CM | POA: Diagnosis not present

## 2021-10-23 DIAGNOSIS — Z79899 Other long term (current) drug therapy: Secondary | ICD-10-CM | POA: Diagnosis not present

## 2021-10-23 DIAGNOSIS — F331 Major depressive disorder, recurrent, moderate: Secondary | ICD-10-CM | POA: Diagnosis not present

## 2021-10-23 DIAGNOSIS — F4310 Post-traumatic stress disorder, unspecified: Secondary | ICD-10-CM | POA: Diagnosis not present

## 2021-10-26 DIAGNOSIS — Z419 Encounter for procedure for purposes other than remedying health state, unspecified: Secondary | ICD-10-CM | POA: Diagnosis not present

## 2021-11-19 DIAGNOSIS — S161XXA Strain of muscle, fascia and tendon at neck level, initial encounter: Secondary | ICD-10-CM | POA: Diagnosis not present

## 2021-11-19 DIAGNOSIS — S39012A Strain of muscle, fascia and tendon of lower back, initial encounter: Secondary | ICD-10-CM | POA: Diagnosis not present

## 2021-11-19 DIAGNOSIS — Y999 Unspecified external cause status: Secondary | ICD-10-CM | POA: Diagnosis not present

## 2021-11-19 DIAGNOSIS — Y92410 Unspecified street and highway as the place of occurrence of the external cause: Secondary | ICD-10-CM | POA: Diagnosis not present

## 2021-11-23 DIAGNOSIS — I10 Essential (primary) hypertension: Secondary | ICD-10-CM | POA: Diagnosis not present

## 2021-11-23 DIAGNOSIS — Z6828 Body mass index (BMI) 28.0-28.9, adult: Secondary | ICD-10-CM | POA: Diagnosis not present

## 2021-11-23 DIAGNOSIS — M5410 Radiculopathy, site unspecified: Secondary | ICD-10-CM | POA: Diagnosis not present

## 2021-11-26 DIAGNOSIS — Z419 Encounter for procedure for purposes other than remedying health state, unspecified: Secondary | ICD-10-CM | POA: Diagnosis not present

## 2021-11-30 DIAGNOSIS — M542 Cervicalgia: Secondary | ICD-10-CM | POA: Diagnosis not present

## 2021-11-30 DIAGNOSIS — M545 Low back pain, unspecified: Secondary | ICD-10-CM | POA: Diagnosis not present

## 2021-12-04 DIAGNOSIS — M545 Low back pain, unspecified: Secondary | ICD-10-CM | POA: Diagnosis not present

## 2021-12-04 DIAGNOSIS — M542 Cervicalgia: Secondary | ICD-10-CM | POA: Diagnosis not present

## 2021-12-12 DIAGNOSIS — M545 Low back pain, unspecified: Secondary | ICD-10-CM | POA: Diagnosis not present

## 2021-12-12 DIAGNOSIS — M542 Cervicalgia: Secondary | ICD-10-CM | POA: Diagnosis not present

## 2021-12-15 DIAGNOSIS — M545 Low back pain, unspecified: Secondary | ICD-10-CM | POA: Diagnosis not present

## 2021-12-15 DIAGNOSIS — M542 Cervicalgia: Secondary | ICD-10-CM | POA: Diagnosis not present

## 2021-12-19 DIAGNOSIS — M545 Low back pain, unspecified: Secondary | ICD-10-CM | POA: Diagnosis not present

## 2021-12-19 DIAGNOSIS — M542 Cervicalgia: Secondary | ICD-10-CM | POA: Diagnosis not present

## 2021-12-22 DIAGNOSIS — M542 Cervicalgia: Secondary | ICD-10-CM | POA: Diagnosis not present

## 2021-12-22 DIAGNOSIS — M545 Low back pain, unspecified: Secondary | ICD-10-CM | POA: Diagnosis not present

## 2021-12-26 DIAGNOSIS — M545 Low back pain, unspecified: Secondary | ICD-10-CM | POA: Diagnosis not present

## 2021-12-26 DIAGNOSIS — M542 Cervicalgia: Secondary | ICD-10-CM | POA: Diagnosis not present

## 2021-12-27 DIAGNOSIS — Z419 Encounter for procedure for purposes other than remedying health state, unspecified: Secondary | ICD-10-CM | POA: Diagnosis not present

## 2022-01-02 DIAGNOSIS — M542 Cervicalgia: Secondary | ICD-10-CM | POA: Diagnosis not present

## 2022-01-02 DIAGNOSIS — M545 Low back pain, unspecified: Secondary | ICD-10-CM | POA: Diagnosis not present

## 2022-01-04 DIAGNOSIS — M542 Cervicalgia: Secondary | ICD-10-CM | POA: Diagnosis not present

## 2022-01-04 DIAGNOSIS — M545 Low back pain, unspecified: Secondary | ICD-10-CM | POA: Diagnosis not present

## 2022-01-08 DIAGNOSIS — F4310 Post-traumatic stress disorder, unspecified: Secondary | ICD-10-CM | POA: Diagnosis not present

## 2022-01-08 DIAGNOSIS — F411 Generalized anxiety disorder: Secondary | ICD-10-CM | POA: Diagnosis not present

## 2022-01-08 DIAGNOSIS — F17210 Nicotine dependence, cigarettes, uncomplicated: Secondary | ICD-10-CM | POA: Diagnosis not present

## 2022-01-08 DIAGNOSIS — Z79899 Other long term (current) drug therapy: Secondary | ICD-10-CM | POA: Diagnosis not present

## 2022-01-08 DIAGNOSIS — F330 Major depressive disorder, recurrent, mild: Secondary | ICD-10-CM | POA: Diagnosis not present

## 2022-01-09 DIAGNOSIS — M545 Low back pain, unspecified: Secondary | ICD-10-CM | POA: Diagnosis not present

## 2022-01-09 DIAGNOSIS — M542 Cervicalgia: Secondary | ICD-10-CM | POA: Diagnosis not present

## 2022-01-22 DIAGNOSIS — M545 Low back pain, unspecified: Secondary | ICD-10-CM | POA: Diagnosis not present

## 2022-01-22 DIAGNOSIS — M542 Cervicalgia: Secondary | ICD-10-CM | POA: Diagnosis not present

## 2022-01-24 DIAGNOSIS — Z419 Encounter for procedure for purposes other than remedying health state, unspecified: Secondary | ICD-10-CM | POA: Diagnosis not present

## 2022-01-25 DIAGNOSIS — M545 Low back pain, unspecified: Secondary | ICD-10-CM | POA: Diagnosis not present

## 2022-01-25 DIAGNOSIS — M542 Cervicalgia: Secondary | ICD-10-CM | POA: Diagnosis not present

## 2022-02-20 DIAGNOSIS — M329 Systemic lupus erythematosus, unspecified: Secondary | ICD-10-CM | POA: Diagnosis not present

## 2022-02-20 DIAGNOSIS — B20 Human immunodeficiency virus [HIV] disease: Secondary | ICD-10-CM | POA: Diagnosis not present

## 2022-02-20 DIAGNOSIS — E1165 Type 2 diabetes mellitus with hyperglycemia: Secondary | ICD-10-CM | POA: Diagnosis not present

## 2022-02-20 DIAGNOSIS — R030 Elevated blood-pressure reading, without diagnosis of hypertension: Secondary | ICD-10-CM | POA: Diagnosis not present

## 2022-02-20 DIAGNOSIS — R002 Palpitations: Secondary | ICD-10-CM | POA: Diagnosis not present

## 2022-02-20 DIAGNOSIS — Z6827 Body mass index (BMI) 27.0-27.9, adult: Secondary | ICD-10-CM | POA: Diagnosis not present

## 2022-02-20 DIAGNOSIS — R079 Chest pain, unspecified: Secondary | ICD-10-CM | POA: Diagnosis not present

## 2022-02-20 DIAGNOSIS — E119 Type 2 diabetes mellitus without complications: Secondary | ICD-10-CM | POA: Diagnosis not present

## 2022-02-24 DIAGNOSIS — Z419 Encounter for procedure for purposes other than remedying health state, unspecified: Secondary | ICD-10-CM | POA: Diagnosis not present

## 2022-03-19 DIAGNOSIS — Z79899 Other long term (current) drug therapy: Secondary | ICD-10-CM | POA: Diagnosis not present

## 2022-03-19 DIAGNOSIS — F411 Generalized anxiety disorder: Secondary | ICD-10-CM | POA: Diagnosis not present

## 2022-03-19 DIAGNOSIS — F17210 Nicotine dependence, cigarettes, uncomplicated: Secondary | ICD-10-CM | POA: Diagnosis not present

## 2022-03-19 DIAGNOSIS — Z6828 Body mass index (BMI) 28.0-28.9, adult: Secondary | ICD-10-CM | POA: Diagnosis not present

## 2022-03-19 DIAGNOSIS — F4310 Post-traumatic stress disorder, unspecified: Secondary | ICD-10-CM | POA: Diagnosis not present

## 2022-03-19 DIAGNOSIS — F330 Major depressive disorder, recurrent, mild: Secondary | ICD-10-CM | POA: Diagnosis not present

## 2022-03-22 DIAGNOSIS — Z79899 Other long term (current) drug therapy: Secondary | ICD-10-CM | POA: Diagnosis not present

## 2022-03-23 DIAGNOSIS — B20 Human immunodeficiency virus [HIV] disease: Secondary | ICD-10-CM | POA: Diagnosis not present

## 2022-03-23 DIAGNOSIS — L03012 Cellulitis of left finger: Secondary | ICD-10-CM | POA: Diagnosis not present

## 2022-03-26 DIAGNOSIS — Z419 Encounter for procedure for purposes other than remedying health state, unspecified: Secondary | ICD-10-CM | POA: Diagnosis not present

## 2022-04-12 DIAGNOSIS — E1165 Type 2 diabetes mellitus with hyperglycemia: Secondary | ICD-10-CM | POA: Diagnosis not present

## 2022-04-12 DIAGNOSIS — E559 Vitamin D deficiency, unspecified: Secondary | ICD-10-CM | POA: Diagnosis not present

## 2022-04-12 DIAGNOSIS — Z20822 Contact with and (suspected) exposure to covid-19: Secondary | ICD-10-CM | POA: Diagnosis not present

## 2022-04-12 DIAGNOSIS — M329 Systemic lupus erythematosus, unspecified: Secondary | ICD-10-CM | POA: Diagnosis not present

## 2022-04-12 DIAGNOSIS — Z6829 Body mass index (BMI) 29.0-29.9, adult: Secondary | ICD-10-CM | POA: Diagnosis not present

## 2022-04-12 DIAGNOSIS — J449 Chronic obstructive pulmonary disease, unspecified: Secondary | ICD-10-CM | POA: Diagnosis not present

## 2022-04-12 DIAGNOSIS — F17210 Nicotine dependence, cigarettes, uncomplicated: Secondary | ICD-10-CM | POA: Diagnosis not present

## 2022-04-12 DIAGNOSIS — R079 Chest pain, unspecified: Secondary | ICD-10-CM | POA: Diagnosis not present

## 2022-04-12 DIAGNOSIS — E7800 Pure hypercholesterolemia, unspecified: Secondary | ICD-10-CM | POA: Diagnosis not present

## 2022-04-12 DIAGNOSIS — R030 Elevated blood-pressure reading, without diagnosis of hypertension: Secondary | ICD-10-CM | POA: Diagnosis not present

## 2022-04-12 DIAGNOSIS — R5383 Other fatigue: Secondary | ICD-10-CM | POA: Diagnosis not present

## 2022-04-26 DIAGNOSIS — M79604 Pain in right leg: Secondary | ICD-10-CM | POA: Diagnosis not present

## 2022-04-26 DIAGNOSIS — M329 Systemic lupus erythematosus, unspecified: Secondary | ICD-10-CM | POA: Diagnosis not present

## 2022-04-26 DIAGNOSIS — E1165 Type 2 diabetes mellitus with hyperglycemia: Secondary | ICD-10-CM | POA: Diagnosis not present

## 2022-04-26 DIAGNOSIS — R059 Cough, unspecified: Secondary | ICD-10-CM | POA: Diagnosis not present

## 2022-04-26 DIAGNOSIS — Z6829 Body mass index (BMI) 29.0-29.9, adult: Secondary | ICD-10-CM | POA: Diagnosis not present

## 2022-04-26 DIAGNOSIS — Z419 Encounter for procedure for purposes other than remedying health state, unspecified: Secondary | ICD-10-CM | POA: Diagnosis not present

## 2022-04-26 DIAGNOSIS — I10 Essential (primary) hypertension: Secondary | ICD-10-CM | POA: Diagnosis not present

## 2022-04-26 DIAGNOSIS — R030 Elevated blood-pressure reading, without diagnosis of hypertension: Secondary | ICD-10-CM | POA: Diagnosis not present

## 2022-04-29 DIAGNOSIS — Z3202 Encounter for pregnancy test, result negative: Secondary | ICD-10-CM | POA: Diagnosis not present

## 2022-04-29 DIAGNOSIS — E119 Type 2 diabetes mellitus without complications: Secondary | ICD-10-CM | POA: Diagnosis not present

## 2022-04-29 DIAGNOSIS — R1032 Left lower quadrant pain: Secondary | ICD-10-CM | POA: Diagnosis not present

## 2022-04-29 DIAGNOSIS — D259 Leiomyoma of uterus, unspecified: Secondary | ICD-10-CM | POA: Diagnosis not present

## 2022-04-29 DIAGNOSIS — R1030 Lower abdominal pain, unspecified: Secondary | ICD-10-CM | POA: Diagnosis not present

## 2022-04-29 DIAGNOSIS — B20 Human immunodeficiency virus [HIV] disease: Secondary | ICD-10-CM | POA: Diagnosis not present

## 2022-04-29 DIAGNOSIS — N83201 Unspecified ovarian cyst, right side: Secondary | ICD-10-CM | POA: Diagnosis not present

## 2022-04-29 DIAGNOSIS — Z7984 Long term (current) use of oral hypoglycemic drugs: Secondary | ICD-10-CM | POA: Diagnosis not present

## 2022-04-29 DIAGNOSIS — I10 Essential (primary) hypertension: Secondary | ICD-10-CM | POA: Diagnosis not present

## 2022-04-30 ENCOUNTER — Encounter (HOSPITAL_BASED_OUTPATIENT_CLINIC_OR_DEPARTMENT_OTHER): Payer: Self-pay | Admitting: Emergency Medicine

## 2022-04-30 ENCOUNTER — Emergency Department (HOSPITAL_BASED_OUTPATIENT_CLINIC_OR_DEPARTMENT_OTHER): Payer: Medicaid Other

## 2022-04-30 ENCOUNTER — Other Ambulatory Visit: Payer: Self-pay

## 2022-04-30 ENCOUNTER — Emergency Department (HOSPITAL_BASED_OUTPATIENT_CLINIC_OR_DEPARTMENT_OTHER)
Admission: EM | Admit: 2022-04-30 | Discharge: 2022-05-01 | Disposition: A | Discharge status: Home / Self Care | Payer: Medicaid Other | Attending: Emergency Medicine | Admitting: Emergency Medicine

## 2022-04-30 DIAGNOSIS — J449 Chronic obstructive pulmonary disease, unspecified: Secondary | ICD-10-CM | POA: Insufficient documentation

## 2022-04-30 DIAGNOSIS — R059 Cough, unspecified: Secondary | ICD-10-CM | POA: Diagnosis not present

## 2022-04-30 DIAGNOSIS — Z79899 Other long term (current) drug therapy: Secondary | ICD-10-CM | POA: Insufficient documentation

## 2022-04-30 DIAGNOSIS — R509 Fever, unspecified: Secondary | ICD-10-CM | POA: Insufficient documentation

## 2022-04-30 DIAGNOSIS — N760 Acute vaginitis: Secondary | ICD-10-CM | POA: Insufficient documentation

## 2022-04-30 DIAGNOSIS — B9689 Other specified bacterial agents as the cause of diseases classified elsewhere: Secondary | ICD-10-CM | POA: Diagnosis not present

## 2022-04-30 DIAGNOSIS — J45909 Unspecified asthma, uncomplicated: Secondary | ICD-10-CM | POA: Diagnosis not present

## 2022-04-30 DIAGNOSIS — N888 Other specified noninflammatory disorders of cervix uteri: Secondary | ICD-10-CM | POA: Diagnosis not present

## 2022-04-30 DIAGNOSIS — R000 Tachycardia, unspecified: Secondary | ICD-10-CM | POA: Insufficient documentation

## 2022-04-30 DIAGNOSIS — R0602 Shortness of breath: Secondary | ICD-10-CM | POA: Diagnosis not present

## 2022-04-30 DIAGNOSIS — E119 Type 2 diabetes mellitus without complications: Secondary | ICD-10-CM | POA: Diagnosis not present

## 2022-04-30 DIAGNOSIS — R102 Pelvic and perineal pain: Secondary | ICD-10-CM | POA: Diagnosis not present

## 2022-04-30 DIAGNOSIS — E871 Hypo-osmolality and hyponatremia: Secondary | ICD-10-CM | POA: Diagnosis not present

## 2022-04-30 DIAGNOSIS — Z7982 Long term (current) use of aspirin: Secondary | ICD-10-CM | POA: Insufficient documentation

## 2022-04-30 DIAGNOSIS — D259 Leiomyoma of uterus, unspecified: Secondary | ICD-10-CM | POA: Diagnosis not present

## 2022-04-30 DIAGNOSIS — K760 Fatty (change of) liver, not elsewhere classified: Secondary | ICD-10-CM | POA: Diagnosis not present

## 2022-04-30 DIAGNOSIS — R112 Nausea with vomiting, unspecified: Secondary | ICD-10-CM | POA: Insufficient documentation

## 2022-04-30 DIAGNOSIS — R109 Unspecified abdominal pain: Secondary | ICD-10-CM | POA: Diagnosis present

## 2022-04-30 DIAGNOSIS — Z21 Asymptomatic human immunodeficiency virus [HIV] infection status: Secondary | ICD-10-CM | POA: Diagnosis not present

## 2022-04-30 DIAGNOSIS — Z794 Long term (current) use of insulin: Secondary | ICD-10-CM | POA: Diagnosis not present

## 2022-04-30 LAB — PREGNANCY, URINE: Preg Test, Ur: NEGATIVE

## 2022-04-30 LAB — WET PREP, GENITAL
Sperm: NONE SEEN
Trich, Wet Prep: NONE SEEN
WBC, Wet Prep HPF POC: >=10 — AB (ref <10)
Yeast Wet Prep HPF POC: NONE SEEN

## 2022-04-30 LAB — COMPREHENSIVE METABOLIC PANEL
ALT: 16 U/L (ref 0–44)
AST: 17 U/L (ref 15–41)
Albumin: 2.1 g/dL — ABNORMAL LOW (ref 3.5–5.0)
Alkaline Phosphatase: 72 U/L (ref 38–126)
Anion gap: 5 (ref 5–15)
BUN: 16 mg/dL (ref 6–20)
CO2: 24 mmol/L (ref 22–32)
Calcium: 8 mg/dL — ABNORMAL LOW (ref 8.9–10.3)
Chloride: 102 mmol/L (ref 98–111)
Creatinine, Ser: 1.08 mg/dL — ABNORMAL HIGH (ref 0.44–1.00)
GFR, Estimated: >60 mL/min (ref >60)
Glucose, Bld: 321 mg/dL — ABNORMAL HIGH (ref 70–99)
Potassium: 4.2 mmol/L (ref 3.5–5.1)
Sodium: 131 mmol/L — ABNORMAL LOW (ref 135–145)
Total Bilirubin: 0.6 mg/dL (ref 0.3–1.2)
Total Protein: 5.9 g/dL — ABNORMAL LOW (ref 6.5–8.1)

## 2022-04-30 LAB — URINALYSIS, ROUTINE W REFLEX MICROSCOPIC
Glucose, UA: >=500 mg/dL — AB
Ketones, ur: NEGATIVE mg/dL
Leukocytes,Ua: NEGATIVE
Nitrite: NEGATIVE
Protein, ur: >=300 mg/dL — AB
Specific Gravity, Urine: 1.02 (ref 1.005–1.030)
pH: 6 (ref 5.0–8.0)

## 2022-04-30 LAB — CBC WITH DIFFERENTIAL/PLATELET
Abs Immature Granulocytes: 0.05 10*3/uL (ref 0.00–0.07)
Basophils Absolute: 0.1 10*3/uL (ref 0.0–0.1)
Basophils Relative: 0 %
Eosinophils Absolute: 0.1 10*3/uL (ref 0.0–0.5)
Eosinophils Relative: 1 %
HCT: 37.7 % (ref 36.0–46.0)
Hemoglobin: 13.6 g/dL (ref 12.0–15.0)
Immature Granulocytes: 0 %
Lymphocytes Relative: 13 %
Lymphs Abs: 1.7 10*3/uL (ref 0.7–4.0)
MCH: 32.7 pg (ref 26.0–34.0)
MCHC: 36.1 g/dL — ABNORMAL HIGH (ref 30.0–36.0)
MCV: 90.6 fL (ref 80.0–100.0)
Monocytes Absolute: 1.2 10*3/uL — ABNORMAL HIGH (ref 0.1–1.0)
Monocytes Relative: 9 %
Neutro Abs: 9.8 10*3/uL — ABNORMAL HIGH (ref 1.7–7.7)
Neutrophils Relative %: 77 %
Platelets: 197 10*3/uL (ref 150–400)
RBC: 4.16 MIL/uL (ref 3.87–5.11)
RDW: 11.5 % (ref 11.5–15.5)
WBC: 12.9 10*3/uL — ABNORMAL HIGH (ref 4.0–10.5)
nRBC: 0 % (ref 0.0–0.2)

## 2022-04-30 LAB — URINALYSIS, MICROSCOPIC (REFLEX)

## 2022-04-30 LAB — LIPASE, BLOOD: Lipase: 27 U/L (ref 11–51)

## 2022-04-30 MED ORDER — OXYCODONE HCL 5 MG PO TABS: 5.0000 mg | ORAL_TABLET | Freq: Four times a day (QID) | ORAL | 0 refills | Status: DC | PRN

## 2022-04-30 MED ORDER — FLUCONAZOLE 150 MG PO TABS: 150.0000 mg | ORAL_TABLET | Freq: Once | ORAL | 0 refills | Status: AC

## 2022-04-30 MED ORDER — MORPHINE SULFATE (PF) 4 MG/ML IV SOLN
4.0000 mg | Freq: Once | INTRAVENOUS | Status: AC
  Administered 2022-04-30: 4 mg via INTRAVENOUS
  Filled 2022-04-30: qty 1

## 2022-04-30 MED ORDER — ACETAMINOPHEN 325 MG PO TABS
650.0000 mg | ORAL_TABLET | Freq: Once | ORAL | Status: AC
  Administered 2022-04-30: 650 mg via ORAL
  Filled 2022-04-30: qty 2

## 2022-04-30 MED ORDER — ONDANSETRON HCL 4 MG/2ML IJ SOLN
4.0000 mg | Freq: Once | INTRAMUSCULAR | Status: AC
  Administered 2022-04-30: 4 mg via INTRAVENOUS
  Filled 2022-04-30: qty 2

## 2022-04-30 MED ORDER — IOHEXOL 300 MG/ML  SOLN
100.0000 mL | Freq: Once | INTRAMUSCULAR | Status: AC | PRN
  Administered 2022-04-30: 100 mL via INTRAVENOUS

## 2022-04-30 MED ORDER — SODIUM CHLORIDE 0.9 % IV SOLN
1.0000 g | Freq: Once | INTRAVENOUS | Status: AC
  Administered 2022-04-30: 1 g via INTRAVENOUS
  Filled 2022-04-30: qty 10

## 2022-04-30 MED ORDER — METRONIDAZOLE 500 MG PO TABS: 500.0000 mg | ORAL_TABLET | Freq: Two times a day (BID) | ORAL | 0 refills | Status: DC

## 2022-04-30 MED ORDER — SODIUM CHLORIDE 0.9 % IV BOLUS
1000.0000 mL | Freq: Once | INTRAVENOUS | Status: AC
  Administered 2022-04-30: 1000 mL via INTRAVENOUS

## 2022-04-30 NOTE — ED Triage Notes (Signed)
Patient reports abdominal pain x4 days with nausea, vomiting, and fever. States has been in bed the last 2 days, checked in at [facility] yesterday but was never seen by a provider.

## 2022-04-30 NOTE — Discharge Instructions (Addendum)
Please use Tylenol or ibuprofen for pain.  You may use 600 mg ibuprofen every 6 hours or 1000 mg of Tylenol every 6 hours.  You may choose to alternate between the 2.  This would be most effective.  Not to exceed 4 g of Tylenol within 24 hours.  Not to exceed 3200 mg ibuprofen 24 hours.  You can use the narcotic pain medication I am prescribing for severe breakthrough pain until your symptoms begin to resolve.  I recommend that you follow-up with your OB/GYN at your earliest convenience to ensure resolution of symptoms.  Please return if your symptoms significantly worsen or fail to improve despite treatment.  Please take the entire course of metronidazole/Flagyl and I am prescribing, and take at least 7 days of doxycycline that you are already prescribed.

## 2022-04-30 NOTE — ED Provider Notes (Signed)
[facility] Provider Note   CSN: [id number] Arrival date & time: 04/30/22  1939     History  Chief Complaint  Patient presents with   Abdominal Pain    [facility] is a 41 y.o. female with past medical history significant for diabetes requiring insulin usage, COPD, asthma, HIV who is currently taking Biktarvy presents with concern for worsening abdominal pain for the last 4 days with nausea, vomiting, fever.  She reports that she has been bedridden for the last 2 days.  Patient reports that she has had some vaginal bleeding for the last 3 weeks on and off.  She was seen at [facility] yesterday and had an ultrasound performed which showed fibroids and ovarian cyst but no other abnormalities.  She reports that she has also had a cough and some shortness of breath for the last few weeks.  She reports that she is compliant with her Biktarvy.  She reports pain 10/10 at this time.   Abdominal Pain Associated symptoms: vaginal bleeding       Home Medications Prior to Admission medications   Medication Sig Start Date End Date Taking? Authorizing Provider  metroNIDAZOLE (FLAGYL) 500 MG tablet Take 1 tablet (500 mg total) by mouth 2 (two) times daily. 04/30/22  Yes Keimon Basaldua H, PA-C  oxyCODONE (ROXICODONE) 5 MG immediate release tablet Take 1 tablet (5 mg total) by mouth every 6 (six) hours as needed for severe pain. 04/30/22  Yes Dyshaun Bonzo H, PA-C  albuterol (PROVENTIL HFA;VENTOLIN HFA) 108 (90 Base) MCG/ACT inhaler Inhale 2 puffs into the lungs every 4 (four) hours as needed for wheezing or shortness of breath. 02/03/19   Volanda Napoleon, PA-C  atorvastatin (LIPITOR) 40 MG tablet Take by mouth. 09/24/20   [provider]  bictegravir-emtricitabine-tenofovir AF (BIKTARVY) 50-200-25 MG TABS tablet Take by mouth daily.    [provider]  cephALEXin (KEFLEX) 500 MG capsule Take 1 capsule (500 mg total) by mouth 4 (four)  times daily. 05/05/19   Palumbo, April, MD  clonazePAM (KLONOPIN) 0.5 MG tablet Take 1 mg by mouth 2 (two) times daily.     [provider]  Dulaglutide (TRULICITY) 3 ZH/0.8MV SOPN INJECT 1 PEN UNDER THE SKIN ONCE A WEEK 09/26/20   [provider]  Elviteg-Cobic-Emtricit-TenofAF (GENVOYA PO) Take by mouth.    [provider]  ergocalciferol (VITAMIN D2) 1.25 MG (50000 UT) capsule Take 1 capsule twice a week. 10/09/19   [provider]  GABAPENTIN PO Take by mouth.    [provider]  glipiZIDE (GLUCOTROL) 10 MG tablet Take by mouth. 10/06/15   [provider]  ibuprofen (ADVIL,MOTRIN) 800 MG tablet Take 1 tablet (800 mg total) by mouth every 8 (eight) hours as needed. 11/17/17   Long, Wonda Olds, MD  insulin glargine (LANTUS) 100 UNIT/ML injection Inject 18 Units into the skin at bedtime.    [provider]  lidocaine (LIDODERM) 5 % Place 1 patch onto the skin daily. Remove & Discard patch within 12 hours or as directed by MD 04/25/19   Petrucelli, Samantha R, PA-C  lisinopril (ZESTRIL) 20 MG tablet Take by mouth. 09/22/20   [provider]  methocarbamol (ROBAXIN) 500 MG tablet Take 1 tablet (500 mg total) by mouth every 8 (eight) hours as needed. 04/25/19   Petrucelli, Samantha R, PA-C  naproxen (NAPROSYN) 500 MG tablet Take 1 tablet (500 mg total) by mouth 2 (two) times daily. 12/05/16   Carlisle Cater,  PA-C  omeprazole (PRILOSEC) 20 MG capsule Take 20 mg by mouth daily.    [provider]  phenazopyridine (PYRIDIUM) 200 MG tablet Take 1 tablet (200 mg total) by mouth 3 (three) times daily. 05/05/19   Palumbo, April, MD  Rimegepant Sulfate (NURTEC) 75 MG TBDP DISSOLVE 1 TABLET ON THE TONGUE DAILY AS NEEDED 09/23/20   [provider]  valACYclovir (VALTREX) 500 MG tablet Take 500 mg by mouth 2 (two) times daily as needed.    [provider]      Allergies    Metformin and related, Wellbutrin [bupropion],  and Trulicity [dulaglutide]    Review of Systems   Review of Systems  Gastrointestinal:  Positive for abdominal pain.  Genitourinary:  Positive for vaginal bleeding.  All other systems reviewed and are negative.  Physical Exam Updated Vital Signs BP 129/85 (BP Location: Right Arm)   Pulse 89   Temp 99.1 F (37.3 C) (Oral)   Resp 17   Ht '5\' 7"'$  (1.702 m)   Wt 81.6 kg   SpO2 95%   BMI 28.19 kg/m  Physical Exam Vitals and nursing note reviewed.  Constitutional:      General: She is not in acute distress.    Appearance: Normal appearance.  HENT:     Head: Normocephalic and atraumatic.  Eyes:     General:        Right eye: No discharge.        Left eye: No discharge.  Cardiovascular:     Rate and Rhythm: Regular rhythm. Tachycardia present.     Heart sounds: No murmur heard.   No friction rub. No gallop.  Pulmonary:     Effort: Pulmonary effort is normal.     Breath sounds: Normal breath sounds.  Abdominal:     General: Bowel sounds are normal.     Palpations: Abdomen is soft.     Comments: Significant tenderness to palpation bilateral lower quadrants and suprapubic region.  No rebound, rigidity, guarding.  Genitourinary:    Comments: Patient with some vaginal discharge from the cervix, she has cervical motion tenderness and tenderness to palpation of bilateral adnexa.  She has some palpable fibroids Skin:    General: Skin is warm and dry.     Capillary Refill: Capillary refill takes less than 2 seconds.  Neurological:     Mental Status: She is alert and oriented to person, place, and time.  Psychiatric:        Mood and Affect: Mood normal.        Behavior: Behavior normal.    ED Results / Procedures / Treatments   Labs (all labs ordered are listed, but only abnormal results are displayed) Labs Reviewed  WET PREP, GENITAL - Abnormal; Notable for the following components:      Result Value   Clue Cells Wet Prep HPF POC PRESENT (*)    WBC, Wet Prep HPF POC >=10  (*)    All other components within normal limits  COMPREHENSIVE METABOLIC PANEL - Abnormal; Notable for the following components:   Sodium 131 (*)    Glucose, Bld 321 (*)    Creatinine, Ser 1.08 (*)    Calcium 8.0 (*)    Total Protein 5.9 (*)    Albumin 2.1 (*)    All other components within normal limits  CBC WITH DIFFERENTIAL/PLATELET - Abnormal; Notable for the following components:   WBC 12.9 (*)    MCHC 36.1 (*)    Neutro Abs 9.8 (*)  Monocytes Absolute 1.2 (*)    All other components within normal limits  URINALYSIS, ROUTINE W REFLEX MICROSCOPIC - Abnormal; Notable for the following components:   Color, Urine AMBER (*)    Glucose, UA >=500 (*)    Hgb urine dipstick MODERATE (*)    Bilirubin Urine SMALL (*)    Protein, ur >=300 (*)    All other components within normal limits  URINALYSIS, MICROSCOPIC (REFLEX) - Abnormal; Notable for the following components:   Bacteria, UA RARE (*)    All other components within normal limits  LIPASE, BLOOD  PREGNANCY, URINE  GC/CHLAMYDIA PROBE AMP ([place]) NOT AT [facility]    EKG None  Radiology DG Chest 2 View  Result Date: 04/30/2022 CLINICAL DATA:  Cough and shortness of breath EXAM: CHEST - 2 VIEW COMPARISON:  09/14/2021 FINDINGS: The heart size and mediastinal contours are within normal limits. Both lungs are clear. The visualized skeletal structures are unremarkable. IMPRESSION: No active cardiopulmonary disease. Electronically Signed   By: Inez Catalina M.D.   On: 04/30/2022 22:09   CT ABDOMEN PELVIS W CONTRAST  Result Date: 04/30/2022 CLINICAL DATA:  Left lower quadrant abdominal pain. Nausea, vomiting and fever. EXAM: CT ABDOMEN AND PELVIS WITH CONTRAST TECHNIQUE: Multidetector CT imaging of the abdomen and pelvis was performed using the standard protocol following bolus administration of intravenous contrast. RADIATION DOSE REDUCTION: This exam was performed according to the departmental dose-optimization program which includes  automated exposure control, adjustment of the mA and/or kV according to patient size and/or use of iterative reconstruction technique. CONTRAST:  150m OMNIPAQUE IOHEXOL 300 MG/ML  SOLN COMPARISON:  [facility] 07/04/2019, 06/20/2019 FINDINGS: Lower chest: No acute airspace disease or pleural effusion. Hepatobiliary: Mild subjective hepatic steatosis. No focal liver lesion. Clips in the gallbladder fossa postcholecystectomy. No biliary dilatation. Pancreas: No ductal dilatation or inflammation. Spleen: Normal in size without focal abnormality. Adrenals/Urinary Tract: Normal adrenal glands. Mild symmetric bilateral perinephric edema. No hydronephrosis. No renal calculi or focal renal lesion. Urinary bladder is only minimally distended. Stomach/Bowel: Unremarkable appearance of the stomach. There is no bowel obstruction or inflammation. There are few fluid-filled loops of small bowel in the left abdomen without wall thickening or abnormal distention. Normal appendix. Small to moderate stool burden without colonic inflammation. Vascular/Lymphatic: Normal caliber abdominal aorta patent portal vein. Scattered small central mesenteric and retroperitoneal lymph nodes are not enlarged by size criteria. Reproductive: Multiple uterine fibroids. Ovoid peripherally enhancing collection within the posterior aspect of the cervix measures 2.7 x 1.8 x 2.2 cm, series 2, image 80. This is more prominent than on prior CT, but smaller than on 06/20/2019 CT. The ovaries were better assessed on yesterday's pelvic ultrasound. There is mild generalized stranding of the pelvic fat. Other: Mild generalized stranding of the pelvic fat. Minimal free fluid in the dependent pelvis. No free air. Musculoskeletal: There are no acute or suspicious osseous abnormalities. IMPRESSION: 1. Ovoid peripherally enhancing collection within the posterior aspect of the cervix measuring 2.7 x 1.8 x 2.2 cm. This  has been waxing and waning in size on prior imaging dating back to 2020. Exact etiology is indeterminate, however the possibility of infected nabothian cyst is considered given the peripheral enhancement. 2. Mild generalized stranding of the pelvic fat, can be seen with pelvic inflammatory disease. 3. Multiple uterine fibroids. 4. Mild nonspecific perinephric edema. 5. Mild hepatic steatosis. Electronically Signed   By: MKeith RakeM.D.   On: 04/30/2022 22:25  Procedures Procedures    Medications Ordered in ED Medications  cefTRIAXone (ROCEPHIN) 1 g in sodium chloride 0.9 % 100 mL IVPB (1 g Intravenous New Bag/Given 04/30/22 2316)  morphine (PF) 4 MG/ML injection 4 mg (4 mg Intravenous Given 04/30/22 2058)  ondansetron (ZOFRAN) injection 4 mg (4 mg Intravenous Given 04/30/22 2058)  sodium chloride 0.9 % bolus 1,000 mL (0 mLs Intravenous Stopped 04/30/22 2320)  acetaminophen (TYLENOL) tablet 650 mg (650 mg Oral Given 04/30/22 2056)  iohexol (OMNIPAQUE) 300 MG/ML solution 100 mL (100 mLs Intravenous Contrast Given 04/30/22 2145)  morphine (PF) 4 MG/ML injection 4 mg (4 mg Intravenous Given 04/30/22 2315)    ED Course/ Medical Decision Making/ A&P                           Medical Decision Making Amount and/or Complexity of Data Reviewed Labs: ordered. Radiology: ordered.  Risk OTC drugs. Prescription drug management.   This patient is a 42 y.o. female who presents to the ED for concern of significant abdominal pain for 4 days with nausea, vomiting, fever, reports she has been in bed for the last 2 days, was seen at [facility] regional yesterday and received an ultrasound but was unable to stay for evaluation, this involves an extensive number of treatment options, and is a complaint that carries with it a high risk of complications and morbidity. The emergent differential diagnosis prior to evaluation includes, but is not limited to, appendicitis, diverticulitis, pelvic inflammatory disease,  STI, other acute or surgical intra-abdominal findings versus other.   This is not an exhaustive differential.   Past Medical History / Co-morbidities / Social History: Patient with history of diabetes, COPD, HIV, asthma.  She reports she is compliant with her Biktarvy.  Additional history: Chart reviewed. Pertinent results include: Reviewed ultrasound findings from [place] regional last night which showed fibroids as well as ovarian cyst without acute intra-abdominal findings to explain patient's significant abdominal pain and symptoms.  Reviewed lab work and imaging from previous emergency [facility] visits as well.  Physical Exam: Physical exam performed. The pertinent findings include: Patient with discharge, cervical motion tenderness, bilateral adnexal tenderness and fullness.  Tenderness palpation of the abdomen.  She has a low-grade fever.  She is in significant pain, some distress on arrival, she is significantly improved after pain medication.  Lab Tests: I ordered, and personally interpreted labs.  The pertinent results include: Patient with some mild hyponatremia, sodium 131.  She has what appears to be pseudohyponatremia in context of her elevated blood glucose, glucose is 321.  Urged her to continue her at home diabetes treatment, I recommend tighter control of her blood glucose with her PCP.  Urinalysis with hemoglobin, protein, glucose suggestive of severe diabetes, but no evidence of acute urinary tract infection.  CBC shows a leukocytosis, white blood cells at 12.9 despite the fact that she has been taking doxycycline for the last 2 days.  She is not pregnant.  Her lipase is unremarkable.   Imaging Studies: I ordered imaging studies including CT abdomen pelvis with contrast, plain film x-ray of the chest. I independently visualized and interpreted imaging which showed no active cardiopulmonary disease or other intrathoracic abnormalities.  CT abdomen pelvis shows some pelvic fat  stranding compatible with pelvic inflammatory disease as well as what appears to be a nabothian gland cyst which is infected.. I agree with the radiologist interpretation.  Medications: I ordered medication including fluid bolus, morphine,  Zofran, Tylenol, Rocephin for hyperglycemia, dehydration, pain, and treating for STI/pelvic inflammatory disease symptoms. Reevaluation of the patient after these medicines showed that the patient improved. I have reviewed the patients home medicines and have made adjustments as needed.  Pain significantly improved, patient appears much more comfortable.  Her vital signs are improved with no tachycardia.  We will discharge with doxycycline, Flagyl, patient reports she already has doxycycline so encouraged her to continue taking for at least a week of total doses.   Disposition: After consideration of the diagnostic results and the patients response to treatment, I feel that patient's symptoms appear consistent with pelvic inflammatory disease/infected nabothian gland cyst plus or minus pelvic pain related to her fibroids.  Encourage follow-up with her OB/GYN to ensure resolution of symptoms, and discussed extensive return precautions to the emergency [facility].  Will discharge with very short course of pain medication as patient has been significantly uncomfortable over the last few days, was quite uncomfortable on arrival.  Patient understands agrees to plan, discharged in stable condition at this time.   I discussed this case with my attending physician Dr. Laverta Baltimore who cosigned this note including patient's presenting symptoms, physical exam, and planned diagnostics and interventions. Attending physician stated agreement with plan or made changes to plan which were implemented.    Final Clinical Impression(s) / ED Diagnoses Final diagnoses:  Nabothian gland cyst  Pelvic pain  Bacterial vaginosis    Rx / DC Orders ED Discharge Orders          Ordered     metroNIDAZOLE (FLAGYL) 500 MG tablet  2 times daily        04/30/22 2336    oxyCODONE (ROXICODONE) 5 MG immediate release tablet  Every 6 hours PRN        04/30/22 2336              Anselmo Pickler, PA-C 04/30/22 2341    Margette Fast, MD 05/01/22 1327

## 2022-05-01 MED ORDER — OXYCODONE HCL 5 MG PO TABS: 5.0000 mg | ORAL_TABLET | Freq: Four times a day (QID) | ORAL | 0 refills | Status: DC | PRN

## 2022-05-02 DIAGNOSIS — D259 Leiomyoma of uterus, unspecified: Secondary | ICD-10-CM | POA: Diagnosis not present

## 2022-05-02 DIAGNOSIS — R1030 Lower abdominal pain, unspecified: Secondary | ICD-10-CM | POA: Diagnosis not present

## 2022-05-02 DIAGNOSIS — N921 Excessive and frequent menstruation with irregular cycle: Secondary | ICD-10-CM | POA: Diagnosis not present

## 2022-05-02 DIAGNOSIS — B20 Human immunodeficiency virus [HIV] disease: Secondary | ICD-10-CM | POA: Diagnosis not present

## 2022-05-02 LAB — GC/CHLAMYDIA PROBE AMP (~~LOC~~) NOT AT ARMC
Chlamydia: NEGATIVE
Comment: NEGATIVE
Comment: NORMAL
Neisseria Gonorrhea: NEGATIVE

## 2022-05-26 DIAGNOSIS — E785 Hyperlipidemia, unspecified: Secondary | ICD-10-CM | POA: Diagnosis not present

## 2022-05-26 DIAGNOSIS — Z6828 Body mass index (BMI) 28.0-28.9, adult: Secondary | ICD-10-CM | POA: Diagnosis not present

## 2022-05-26 DIAGNOSIS — E782 Mixed hyperlipidemia: Secondary | ICD-10-CM | POA: Diagnosis not present

## 2022-05-26 DIAGNOSIS — E1300 Other specified diabetes mellitus with hyperosmolarity without nonketotic hyperglycemic-hyperosmolar coma (NKHHC): Secondary | ICD-10-CM | POA: Diagnosis not present

## 2022-05-26 DIAGNOSIS — F419 Anxiety disorder, unspecified: Secondary | ICD-10-CM | POA: Diagnosis not present

## 2022-05-26 DIAGNOSIS — F32A Depression, unspecified: Secondary | ICD-10-CM | POA: Diagnosis not present

## 2022-05-26 DIAGNOSIS — Z760 Encounter for issue of repeat prescription: Secondary | ICD-10-CM | POA: Diagnosis not present

## 2022-05-26 DIAGNOSIS — Z419 Encounter for procedure for purposes other than remedying health state, unspecified: Secondary | ICD-10-CM | POA: Diagnosis not present

## 2022-05-26 DIAGNOSIS — E119 Type 2 diabetes mellitus without complications: Secondary | ICD-10-CM | POA: Diagnosis not present

## 2022-05-28 ENCOUNTER — Encounter (HOSPITAL_BASED_OUTPATIENT_CLINIC_OR_DEPARTMENT_OTHER): Payer: Self-pay

## 2022-05-28 ENCOUNTER — Emergency Department (HOSPITAL_BASED_OUTPATIENT_CLINIC_OR_DEPARTMENT_OTHER)
Admission: EM | Admit: 2022-05-28 | Discharge: 2022-05-28 | Discharge status: Against Medical Advice | Payer: Medicaid Other | Attending: Emergency Medicine | Admitting: Emergency Medicine

## 2022-05-28 DIAGNOSIS — Z5321 Procedure and treatment not carried out due to patient leaving prior to being seen by health care provider: Secondary | ICD-10-CM | POA: Diagnosis not present

## 2022-05-28 DIAGNOSIS — N898 Other specified noninflammatory disorders of vagina: Secondary | ICD-10-CM | POA: Insufficient documentation

## 2022-05-28 DIAGNOSIS — R102 Pelvic and perineal pain: Secondary | ICD-10-CM | POA: Diagnosis not present

## 2022-05-28 DIAGNOSIS — B9689 Other specified bacterial agents as the cause of diseases classified elsewhere: Secondary | ICD-10-CM | POA: Insufficient documentation

## 2022-05-28 LAB — URINALYSIS, MICROSCOPIC (REFLEX)

## 2022-05-28 LAB — URINALYSIS, ROUTINE W REFLEX MICROSCOPIC
Bilirubin Urine: NEGATIVE
Glucose, UA: >=500 mg/dL — AB
Ketones, ur: NEGATIVE mg/dL
Leukocytes,Ua: NEGATIVE
Nitrite: NEGATIVE
Protein, ur: >=300 mg/dL — AB
Specific Gravity, Urine: 1.02 (ref 1.005–1.030)
pH: 7 (ref 5.0–8.0)

## 2022-05-28 LAB — PREGNANCY, URINE: Preg Test, Ur: NEGATIVE

## 2022-05-28 NOTE — ED Triage Notes (Signed)
States is having large amounts of clear vaginal discharge and pelvic/back pain since today. Diagnosed with BV on 6/5.

## 2022-05-28 NOTE — ED Notes (Signed)
Called x2, no answer 

## 2022-05-28 NOTE — ED Notes (Signed)
Called to be roomed x 3; no answer.

## 2022-06-01 DIAGNOSIS — M5416 Radiculopathy, lumbar region: Secondary | ICD-10-CM | POA: Diagnosis not present

## 2022-06-01 DIAGNOSIS — R609 Edema, unspecified: Secondary | ICD-10-CM | POA: Diagnosis not present

## 2022-06-01 DIAGNOSIS — E1165 Type 2 diabetes mellitus with hyperglycemia: Secondary | ICD-10-CM | POA: Diagnosis not present

## 2022-06-01 DIAGNOSIS — E785 Hyperlipidemia, unspecified: Secondary | ICD-10-CM | POA: Diagnosis not present

## 2022-06-01 DIAGNOSIS — E669 Obesity, unspecified: Secondary | ICD-10-CM | POA: Diagnosis not present

## 2022-06-01 DIAGNOSIS — G8929 Other chronic pain: Secondary | ICD-10-CM | POA: Diagnosis not present

## 2022-06-01 DIAGNOSIS — R030 Elevated blood-pressure reading, without diagnosis of hypertension: Secondary | ICD-10-CM | POA: Diagnosis not present

## 2022-06-01 DIAGNOSIS — Z6838 Body mass index (BMI) 38.0-38.9, adult: Secondary | ICD-10-CM | POA: Diagnosis not present

## 2022-06-01 DIAGNOSIS — Z6828 Body mass index (BMI) 28.0-28.9, adult: Secondary | ICD-10-CM | POA: Diagnosis not present

## 2022-06-15 DIAGNOSIS — R300 Dysuria: Secondary | ICD-10-CM | POA: Diagnosis not present

## 2022-06-15 DIAGNOSIS — E1165 Type 2 diabetes mellitus with hyperglycemia: Secondary | ICD-10-CM | POA: Diagnosis not present

## 2022-06-15 DIAGNOSIS — Z6830 Body mass index (BMI) 30.0-30.9, adult: Secondary | ICD-10-CM | POA: Diagnosis not present

## 2022-06-20 ENCOUNTER — Encounter (HOSPITAL_BASED_OUTPATIENT_CLINIC_OR_DEPARTMENT_OTHER): Payer: Self-pay

## 2022-06-20 ENCOUNTER — Emergency Department (HOSPITAL_BASED_OUTPATIENT_CLINIC_OR_DEPARTMENT_OTHER)
Admission: EM | Admit: 2022-06-20 | Discharge: 2022-06-20 | Discharge status: Against Medical Advice | Payer: Medicaid Other | Attending: Emergency Medicine | Admitting: Emergency Medicine

## 2022-06-20 ENCOUNTER — Emergency Department (HOSPITAL_BASED_OUTPATIENT_CLINIC_OR_DEPARTMENT_OTHER): Payer: Medicaid Other

## 2022-06-20 ENCOUNTER — Other Ambulatory Visit: Payer: Self-pay

## 2022-06-20 DIAGNOSIS — Z794 Long term (current) use of insulin: Secondary | ICD-10-CM | POA: Insufficient documentation

## 2022-06-20 DIAGNOSIS — M79672 Pain in left foot: Secondary | ICD-10-CM | POA: Insufficient documentation

## 2022-06-20 DIAGNOSIS — R770 Abnormality of albumin: Secondary | ICD-10-CM | POA: Diagnosis not present

## 2022-06-20 DIAGNOSIS — R739 Hyperglycemia, unspecified: Secondary | ICD-10-CM | POA: Diagnosis not present

## 2022-06-20 DIAGNOSIS — R600 Localized edema: Secondary | ICD-10-CM | POA: Insufficient documentation

## 2022-06-20 DIAGNOSIS — M7989 Other specified soft tissue disorders: Secondary | ICD-10-CM | POA: Diagnosis not present

## 2022-06-20 DIAGNOSIS — R0602 Shortness of breath: Secondary | ICD-10-CM | POA: Diagnosis not present

## 2022-06-20 DIAGNOSIS — R609 Edema, unspecified: Secondary | ICD-10-CM

## 2022-06-20 LAB — CBC
HCT: 37.1 % (ref 36.0–46.0)
Hemoglobin: 13 g/dL (ref 12.0–15.0)
MCH: 33.2 pg (ref 26.0–34.0)
MCHC: 35 g/dL (ref 30.0–36.0)
MCV: 94.9 fL (ref 80.0–100.0)
Platelets: 252 10*3/uL (ref 150–400)
RBC: 3.91 MIL/uL (ref 3.87–5.11)
RDW: 12.1 % (ref 11.5–15.5)
WBC: 8.3 10*3/uL (ref 4.0–10.5)
nRBC: 0 % (ref 0.0–0.2)

## 2022-06-20 LAB — HEPATIC FUNCTION PANEL
ALT: 18 U/L (ref 0–44)
AST: 19 U/L (ref 15–41)
Albumin: 2.6 g/dL — ABNORMAL LOW (ref 3.5–5.0)
Alkaline Phosphatase: 56 U/L (ref 38–126)
Bilirubin, Direct: <0.1 mg/dL (ref 0.0–0.2)
Total Bilirubin: 0.4 mg/dL (ref 0.3–1.2)
Total Protein: 6.1 g/dL — ABNORMAL LOW (ref 6.5–8.1)

## 2022-06-20 LAB — BASIC METABOLIC PANEL
Anion gap: 6 (ref 5–15)
BUN: 23 mg/dL — ABNORMAL HIGH (ref 6–20)
CO2: 25 mmol/L (ref 22–32)
Calcium: 8.5 mg/dL — ABNORMAL LOW (ref 8.9–10.3)
Chloride: 105 mmol/L (ref 98–111)
Creatinine, Ser: 1.03 mg/dL — ABNORMAL HIGH (ref 0.44–1.00)
GFR, Estimated: >60 mL/min (ref >60)
Glucose, Bld: 216 mg/dL — ABNORMAL HIGH (ref 70–99)
Potassium: 3.8 mmol/L (ref 3.5–5.1)
Sodium: 136 mmol/L (ref 135–145)

## 2022-06-20 LAB — TROPONIN I (HIGH SENSITIVITY): Troponin I (High Sensitivity): 3 ng/L (ref <18)

## 2022-06-20 LAB — PREGNANCY, URINE: Preg Test, Ur: NEGATIVE

## 2022-06-20 NOTE — ED Triage Notes (Signed)
BIL leg swelling R>L x1 week. Pt endorses BIL foot pain. Pt takes torsemide BID.

## 2022-06-20 NOTE — ED Provider Notes (Signed)
[facility] Provider Note   CSN: [id number] Arrival date & time: 06/20/22  1642     History {Add pertinent medical, surgical, social history, OB history to HPI:1} Chief Complaint  Patient presents with   Leg Swelling    Renee Bryant is a 41 y.o. female.  She is here with a complaint of bilateral foot and ankle swelling that is been going on for a while.  She said her doctors run multiple tests including recent ultrasounds lab work.  She was switched off of furosemide to torsemide, she does not know why but she thinks it was Dr. Jacqualine Code.  She says it does not seem to be helping as much as the furosemide was.  Feet and ankles are painful to walk on due to the swelling.  Improves after elevation but worsens throughout the day.  No chest pain or shortness of breath.  She said she had a full cardiac work-up and there were no problems identified.  No fevers or chills.  The history is provided by the patient.  Leg Pain Location:  Foot Injury: no   Foot location:  L foot and R foot Pain details:    Quality:  Pressure   Timing:  Constant   Progression:  Unchanged Relieved by:  Nothing Worsened by:  Bearing weight Ineffective treatments:  Elevation Associated symptoms: swelling   Associated symptoms: no fever and no numbness        Home Medications Prior to Admission medications   Medication Sig Start Date End Date Taking? Authorizing Provider  albuterol (PROVENTIL HFA;VENTOLIN HFA) 108 (90 Base) MCG/ACT inhaler Inhale 2 puffs into the lungs every 4 (four) hours as needed for wheezing or shortness of breath. 02/03/19   Volanda Napoleon, PA-C  atorvastatin (LIPITOR) 40 MG tablet Take by mouth. 09/24/20   [provider]  bictegravir-emtricitabine-tenofovir AF (BIKTARVY) 50-200-25 MG TABS tablet Take by mouth daily.    [provider]  cephALEXin (KEFLEX) 500 MG capsule Take 1 capsule (500 mg total) by mouth 4 (four) times daily. 05/05/19    Palumbo, April, MD  clonazePAM (KLONOPIN) 0.5 MG tablet Take 1 mg by mouth 2 (two) times daily.     [provider]  Dulaglutide (TRULICITY) 3 FI/4.3PI SOPN INJECT 1 PEN UNDER THE SKIN ONCE A WEEK 09/26/20   [provider]  Elviteg-Cobic-Emtricit-TenofAF (GENVOYA PO) Take by mouth.    [provider]  ergocalciferol (VITAMIN D2) 1.25 MG (50000 UT) capsule Take 1 capsule twice a week. 10/09/19   [provider]  GABAPENTIN PO Take by mouth.    [provider]  glipiZIDE (GLUCOTROL) 10 MG tablet Take by mouth. 10/06/15   [provider]  ibuprofen (ADVIL,MOTRIN) 800 MG tablet Take 1 tablet (800 mg total) by mouth every 8 (eight) hours as needed. 11/17/17   Long, Wonda Olds, MD  insulin glargine (LANTUS) 100 UNIT/ML injection Inject 18 Units into the skin at bedtime.    [provider]  lidocaine (LIDODERM) 5 % Place 1 patch onto the skin daily. Remove & Discard patch within 12 hours or as directed by MD 04/25/19   Petrucelli, Samantha R, PA-C  lisinopril (ZESTRIL) 20 MG tablet Take by mouth. 09/22/20   [provider]  methocarbamol (ROBAXIN) 500 MG tablet Take 1 tablet (500 mg total) by mouth every 8 (eight) hours as needed. 04/25/19   Petrucelli, Samantha R, PA-C  metroNIDAZOLE (FLAGYL) 500 MG tablet Take 1 tablet (500 mg total) by mouth 2 (  two) times daily. 04/30/22   Prosperi, Christian H, PA-C  naproxen (NAPROSYN) 500 MG tablet Take 1 tablet (500 mg total) by mouth 2 (two) times daily. 12/05/16   Carlisle Cater, PA-C  omeprazole (PRILOSEC) 20 MG capsule Take 20 mg by mouth daily.    [provider]  oxyCODONE (ROXICODONE) 5 MG immediate release tablet Take 1 tablet (5 mg total) by mouth every 6 (six) hours as needed for severe pain. 12/03/59   Delora Fuel, MD  phenazopyridine (PYRIDIUM) 200 MG tablet Take 1 tablet (200 mg total) by mouth 3 (three) times daily. 05/05/19   Palumbo, April, MD  Rimegepant Sulfate (NURTEC) 75 MG  TBDP DISSOLVE 1 TABLET ON THE TONGUE DAILY AS NEEDED 09/23/20   [provider]  valACYclovir (VALTREX) 500 MG tablet Take 500 mg by mouth 2 (two) times daily as needed.    [provider]      Allergies    Metformin and related, Wellbutrin [bupropion], and Trulicity [dulaglutide]    Review of Systems   Review of Systems  Constitutional:  Negative for fever.  Respiratory:  Negative for shortness of breath.   Cardiovascular:  Positive for leg swelling. Negative for chest pain.  Skin:  Negative for wound.    Physical Exam Updated Vital Signs BP 130/83 (BP Location: Right Arm)   Pulse 76   Temp 97.8 F (36.6 C) (Oral)   Resp 16   Ht '5\' 7"'$  (1.702 m)   Wt 87.5 kg   LMP 05/20/2022 (Approximate)   SpO2 97%   BMI 30.23 kg/m  Physical Exam Vitals and nursing note reviewed.  Constitutional:      General: She is not in acute distress.    Appearance: Normal appearance. She is well-developed.  HENT:     Head: Normocephalic and atraumatic.  Eyes:     Conjunctiva/sclera: Conjunctivae normal.  Cardiovascular:     Rate and Rhythm: Normal rate and regular rhythm.     Heart sounds: No murmur heard. Pulmonary:     Effort: Pulmonary effort is normal. No respiratory distress.     Breath sounds: Normal breath sounds.  Abdominal:     Palpations: Abdomen is soft.     Tenderness: There is no abdominal tenderness. There is no guarding or rebound.  Musculoskeletal:     Cervical back: Neck supple.     Right lower leg: Edema present.     Left lower leg: Edema present.     Comments: She has pitting edema of both feet up to just above the ankles.  There is no erythema or open wounds.  Distal pulses intact.  Motor and sensation intact.  Skin:    General: Skin is warm and dry.     Capillary Refill: Capillary refill takes less than 2 seconds.  Neurological:     General: No focal deficit present.     Mental Status: She is alert.     ED Results / Procedures / Treatments    Labs (all labs ordered are listed, but only abnormal results are displayed) Labs Reviewed  BASIC METABOLIC PANEL - Abnormal; Notable for the following components:      Result Value   Glucose, Bld 216 (*)    BUN 23 (*)    Creatinine, Ser 1.03 (*)    Calcium 8.5 (*)    All other components within normal limits  CBC  PREGNANCY, URINE  TROPONIN I (HIGH SENSITIVITY)  TROPONIN I (HIGH SENSITIVITY)    EKG None  Radiology DG Chest  2 View  Result Date: 06/20/2022 CLINICAL DATA:  Shortness of breath. Bilateral leg swelling for 1 week. EXAM: CHEST - 2 VIEW COMPARISON:  04/30/2022 FINDINGS: Normal heart size and pulmonary vascularity. No focal airspace disease or consolidation in the lungs. No blunting of costophrenic angles. No pneumothorax. Mediastinal contours appear intact. Surgical clips in the right upper quadrant. IMPRESSION: No active cardiopulmonary disease. Electronically Signed   By: Lucienne Capers M.D.   On: 06/20/2022 17:34    Procedures Procedures  {Document cardiac monitor, telemetry assessment procedure when appropriate:1}  Medications Ordered in ED Medications - No data to display  ED Course/ Medical Decision Making/ A&P                           Medical Decision Making Amount and/or Complexity of Data Reviewed Labs: ordered. Radiology: ordered.   ***  {Document critical care time when appropriate:1} {Document review of labs and clinical decision tools ie heart score, Chads2Vasc2 etc:1}  {Document your independent review of radiology images, and any outside records:1} {Document your discussion with family members, caretakers, and with consultants:1} {Document social determinants of health affecting pt's care:1} {Document your decision making why or why not admission, treatments were needed:1} Final Clinical Impression(s) / ED Diagnoses Final diagnoses:  None    Rx / DC Orders ED Discharge Orders     None

## 2022-06-20 NOTE — ED Notes (Signed)
Witnessed pt leaving with dtg on her cell phone yelling expletives.

## 2022-06-26 DIAGNOSIS — Z419 Encounter for procedure for purposes other than remedying health state, unspecified: Secondary | ICD-10-CM | POA: Diagnosis not present

## 2022-06-29 DIAGNOSIS — E1165 Type 2 diabetes mellitus with hyperglycemia: Secondary | ICD-10-CM | POA: Diagnosis not present

## 2022-06-29 DIAGNOSIS — I10 Essential (primary) hypertension: Secondary | ICD-10-CM | POA: Diagnosis not present

## 2022-06-29 DIAGNOSIS — Z6831 Body mass index (BMI) 31.0-31.9, adult: Secondary | ICD-10-CM | POA: Diagnosis not present

## 2022-06-29 DIAGNOSIS — B20 Human immunodeficiency virus [HIV] disease: Secondary | ICD-10-CM | POA: Diagnosis not present

## 2022-06-29 DIAGNOSIS — F419 Anxiety disorder, unspecified: Secondary | ICD-10-CM | POA: Diagnosis not present

## 2022-06-29 DIAGNOSIS — F32A Depression, unspecified: Secondary | ICD-10-CM | POA: Diagnosis not present

## 2022-06-29 DIAGNOSIS — J449 Chronic obstructive pulmonary disease, unspecified: Secondary | ICD-10-CM | POA: Diagnosis not present

## 2022-06-29 DIAGNOSIS — R600 Localized edema: Secondary | ICD-10-CM | POA: Diagnosis not present

## 2022-07-04 DIAGNOSIS — B20 Human immunodeficiency virus [HIV] disease: Secondary | ICD-10-CM | POA: Diagnosis not present

## 2022-07-04 DIAGNOSIS — R87610 Atypical squamous cells of undetermined significance on cytologic smear of cervix (ASC-US): Secondary | ICD-10-CM | POA: Diagnosis not present

## 2022-07-04 DIAGNOSIS — Z0001 Encounter for general adult medical examination with abnormal findings: Secondary | ICD-10-CM | POA: Diagnosis not present

## 2022-07-04 DIAGNOSIS — Z1151 Encounter for screening for human papillomavirus (HPV): Secondary | ICD-10-CM | POA: Diagnosis not present

## 2022-07-04 DIAGNOSIS — R87619 Unspecified abnormal cytological findings in specimens from cervix uteri: Secondary | ICD-10-CM | POA: Diagnosis not present

## 2022-07-04 DIAGNOSIS — Z124 Encounter for screening for malignant neoplasm of cervix: Secondary | ICD-10-CM | POA: Diagnosis not present

## 2022-07-04 DIAGNOSIS — N946 Dysmenorrhea, unspecified: Secondary | ICD-10-CM | POA: Diagnosis not present

## 2022-07-04 DIAGNOSIS — N920 Excessive and frequent menstruation with regular cycle: Secondary | ICD-10-CM | POA: Diagnosis not present

## 2022-07-04 DIAGNOSIS — Z01411 Encounter for gynecological examination (general) (routine) with abnormal findings: Secondary | ICD-10-CM | POA: Diagnosis not present

## 2022-07-04 DIAGNOSIS — R87810 Cervical high risk human papillomavirus (HPV) DNA test positive: Secondary | ICD-10-CM | POA: Diagnosis not present

## 2022-07-04 LAB — HM PAP SMEAR

## 2022-07-21 ENCOUNTER — Emergency Department (HOSPITAL_BASED_OUTPATIENT_CLINIC_OR_DEPARTMENT_OTHER): Payer: Medicaid Other

## 2022-07-21 ENCOUNTER — Emergency Department (HOSPITAL_BASED_OUTPATIENT_CLINIC_OR_DEPARTMENT_OTHER)
Admission: EM | Admit: 2022-07-21 | Discharge: 2022-07-21 | Disposition: A | Discharge status: Home / Self Care | Payer: Medicaid Other | Attending: Emergency Medicine | Admitting: Emergency Medicine

## 2022-07-21 ENCOUNTER — Encounter (HOSPITAL_BASED_OUTPATIENT_CLINIC_OR_DEPARTMENT_OTHER): Payer: Self-pay | Admitting: Emergency Medicine

## 2022-07-21 DIAGNOSIS — R079 Chest pain, unspecified: Secondary | ICD-10-CM | POA: Insufficient documentation

## 2022-07-21 DIAGNOSIS — Z794 Long term (current) use of insulin: Secondary | ICD-10-CM | POA: Diagnosis not present

## 2022-07-21 DIAGNOSIS — R9431 Abnormal electrocardiogram [ECG] [EKG]: Secondary | ICD-10-CM | POA: Diagnosis not present

## 2022-07-21 DIAGNOSIS — R609 Edema, unspecified: Secondary | ICD-10-CM

## 2022-07-21 DIAGNOSIS — R0602 Shortness of breath: Secondary | ICD-10-CM | POA: Insufficient documentation

## 2022-07-21 DIAGNOSIS — M7989 Other specified soft tissue disorders: Secondary | ICD-10-CM | POA: Diagnosis present

## 2022-07-21 DIAGNOSIS — R600 Localized edema: Secondary | ICD-10-CM | POA: Insufficient documentation

## 2022-07-21 DIAGNOSIS — R7989 Other specified abnormal findings of blood chemistry: Secondary | ICD-10-CM | POA: Insufficient documentation

## 2022-07-21 LAB — CBC WITH DIFFERENTIAL/PLATELET
Abs Immature Granulocytes: 0.02 10*3/uL (ref 0.00–0.07)
Basophils Absolute: 0.1 10*3/uL (ref 0.0–0.1)
Basophils Relative: 1 %
Eosinophils Absolute: 0.3 10*3/uL (ref 0.0–0.5)
Eosinophils Relative: 3 %
HCT: 35.6 % — ABNORMAL LOW (ref 36.0–46.0)
Hemoglobin: 12.4 g/dL (ref 12.0–15.0)
Immature Granulocytes: 0 %
Lymphocytes Relative: 24 %
Lymphs Abs: 2.2 10*3/uL (ref 0.7–4.0)
MCH: 33 pg (ref 26.0–34.0)
MCHC: 34.8 g/dL (ref 30.0–36.0)
MCV: 94.7 fL (ref 80.0–100.0)
Monocytes Absolute: 0.8 10*3/uL (ref 0.1–1.0)
Monocytes Relative: 9 %
Neutro Abs: 5.9 10*3/uL (ref 1.7–7.7)
Neutrophils Relative %: 63 %
Platelets: 243 10*3/uL (ref 150–400)
RBC: 3.76 MIL/uL — ABNORMAL LOW (ref 3.87–5.11)
RDW: 12.4 % (ref 11.5–15.5)
WBC: 9.2 10*3/uL (ref 4.0–10.5)
nRBC: 0 % (ref 0.0–0.2)

## 2022-07-21 LAB — COMPREHENSIVE METABOLIC PANEL
ALT: 19 U/L (ref 0–44)
AST: 20 U/L (ref 15–41)
Albumin: 2.4 g/dL — ABNORMAL LOW (ref 3.5–5.0)
Alkaline Phosphatase: 53 U/L (ref 38–126)
Anion gap: 3 — ABNORMAL LOW (ref 5–15)
BUN: 20 mg/dL (ref 6–20)
CO2: 26 mmol/L (ref 22–32)
Calcium: 7.9 mg/dL — ABNORMAL LOW (ref 8.9–10.3)
Chloride: 106 mmol/L (ref 98–111)
Creatinine, Ser: 1.37 mg/dL — ABNORMAL HIGH (ref 0.44–1.00)
GFR, Estimated: 50 mL/min — ABNORMAL LOW (ref >60)
Glucose, Bld: 280 mg/dL — ABNORMAL HIGH (ref 70–99)
Potassium: 3.7 mmol/L (ref 3.5–5.1)
Sodium: 135 mmol/L (ref 135–145)
Total Bilirubin: 0.5 mg/dL (ref 0.3–1.2)
Total Protein: 5.9 g/dL — ABNORMAL LOW (ref 6.5–8.1)

## 2022-07-21 LAB — BRAIN NATRIURETIC PEPTIDE: B Natriuretic Peptide: 68.5 pg/mL (ref 0.0–100.0)

## 2022-07-21 MED ORDER — LOSARTAN POTASSIUM 50 MG PO TABS: 50.0000 mg | ORAL_TABLET | Freq: Every day | ORAL | 0 refills | Status: DC

## 2022-07-21 MED ORDER — FUROSEMIDE 10 MG/ML IJ SOLN
40.0000 mg | Freq: Once | INTRAMUSCULAR | Status: AC
  Administered 2022-07-21: 40 mg via INTRAVENOUS
  Filled 2022-07-21: qty 4

## 2022-07-21 NOTE — Discharge Instructions (Signed)
Your laboratory results are within normal limits today.  We discussed following up with your endocrinologist at your appointment.  I have written prescription for your blood pressure medication, please take this medication as prescribed.  You experience any worsening symptoms please return to the emergency department.

## 2022-07-21 NOTE — ED Provider Notes (Signed)
[facility] Provider Note   CSN: [id number] Arrival date & time: 07/21/22  2103     History  Chief Complaint  Patient presents with   Leg Swelling    Renee Bryant is a 41 y.o. female.  41 year old female with a past medical history of peripheral edema presents to the ED with a chief complaint of bilateral leg swelling for the last few weeks.  Patient reports she was 168 previously now has been weighing 200 pounds.  She was evaluated here for the same complaint recently reports her medication was changed from furosemide to Lasix.  She reports after doubling her dose of Lasix and this medication now twice a day she has not seen any improvement on the swelling.  She she is not voiding adequately, she does have an appointment to follow-up with an endocrinologist in about a month however has not been able to keep the fluid off her legs.  Is wearing compression socks at home, however feels that her skin is likely going to burst.  Also endorses some shortness of breath, however does endorse tobacco use.  Chest pain, no fevers, no cough or other complaints. Of note, patient out of her blood pressure medication for the past 2 weeks, reports she tried to get a refill from her PCP however she was unable to obtain 1.   The history is provided by the patient and medical records.       Home Medications Prior to Admission medications   Medication Sig Start Date End Date Taking? Authorizing Provider  losartan (COZAAR) 50 MG tablet Take 1 tablet (50 mg total) by mouth daily. 07/21/22 08/20/22 Yes Takeysha Bonk, PA-C  albuterol (PROVENTIL HFA;VENTOLIN HFA) 108 (90 Base) MCG/ACT inhaler Inhale 2 puffs into the lungs every 4 (four) hours as needed for wheezing or shortness of breath. 02/03/19   Volanda Napoleon, PA-C  atorvastatin (LIPITOR) 40 MG tablet Take by mouth. 09/24/20   [provider]  bictegravir-emtricitabine-tenofovir AF (BIKTARVY) 50-200-25 MG TABS  tablet Take by mouth daily.    [provider]  cephALEXin (KEFLEX) 500 MG capsule Take 1 capsule (500 mg total) by mouth 4 (four) times daily. 05/05/19   Palumbo, April, MD  clonazePAM (KLONOPIN) 0.5 MG tablet Take 1 mg by mouth 2 (two) times daily.     [provider]  Dulaglutide (TRULICITY) 3 NW/2.9FA SOPN INJECT 1 PEN UNDER THE SKIN ONCE A WEEK 09/26/20   [provider]  Elviteg-Cobic-Emtricit-TenofAF (GENVOYA PO) Take by mouth.    [provider]  ergocalciferol (VITAMIN D2) 1.25 MG (50000 UT) capsule Take 1 capsule twice a week. 10/09/19   [provider]  GABAPENTIN PO Take by mouth.    [provider]  glipiZIDE (GLUCOTROL) 10 MG tablet Take by mouth. 10/06/15   [provider]  ibuprofen (ADVIL,MOTRIN) 800 MG tablet Take 1 tablet (800 mg total) by mouth every 8 (eight) hours as needed. 11/17/17   Long, Wonda Olds, MD  insulin glargine (LANTUS) 100 UNIT/ML injection Inject 18 Units into the skin at bedtime.    [provider]  lidocaine (LIDODERM) 5 % Place 1 patch onto the skin daily. Remove & Discard patch within 12 hours or as directed by MD 04/25/19   Petrucelli, Glynda Jaeger, PA-C  methocarbamol (ROBAXIN) 500 MG tablet Take 1 tablet (500 mg total) by mouth every 8 (eight) hours as needed. 04/25/19   Petrucelli, Samantha R, PA-C  metroNIDAZOLE (FLAGYL) 500 MG tablet Take 1 tablet (  500 mg total) by mouth 2 (two) times daily. 04/30/22   Prosperi, Christian H, PA-C  naproxen (NAPROSYN) 500 MG tablet Take 1 tablet (500 mg total) by mouth 2 (two) times daily. 12/05/16   Carlisle Cater, PA-C  omeprazole (PRILOSEC) 20 MG capsule Take 20 mg by mouth daily.    [provider]  oxyCODONE (ROXICODONE) 5 MG immediate release tablet Take 1 tablet (5 mg total) by mouth every 6 (six) hours as needed for severe pain. 12/01/08   Delora Fuel, MD  phenazopyridine (PYRIDIUM) 200 MG tablet Take 1 tablet (200 mg total) by mouth 3 (three)  times daily. 05/05/19   Palumbo, April, MD  Rimegepant Sulfate (NURTEC) 75 MG TBDP DISSOLVE 1 TABLET ON THE TONGUE DAILY AS NEEDED 09/23/20   [provider]  valACYclovir (VALTREX) 500 MG tablet Take 500 mg by mouth 2 (two) times daily as needed.    [provider]      Allergies    Metformin and related, Wellbutrin [bupropion], and Trulicity [dulaglutide]    Review of Systems   Review of Systems  Constitutional:  Negative for fever.  Respiratory:  Positive for shortness of breath.   Cardiovascular:  Positive for leg swelling (bilateral). Negative for chest pain.  Gastrointestinal:  Negative for abdominal pain, diarrhea and vomiting.  Musculoskeletal:  Negative for back pain.  Neurological:  Negative for numbness.  All other systems reviewed and are negative.   Physical Exam Updated Vital Signs BP (!) 200/106 (BP Location: Left Arm)   Pulse 86   Temp 99 F (37.2 C) (Oral)   Resp 20   Ht '5\' 7"'$  (1.702 m)   Wt 90.7 kg   LMP 07/06/2022 (Approximate)   SpO2 100%   BMI 31.32 kg/m  Physical Exam Vitals and nursing note reviewed.  Constitutional:      Appearance: Normal appearance.  HENT:     Mouth/Throat:     Mouth: Mucous membranes are moist.  Cardiovascular:     Rate and Rhythm: Normal rate.     Pulses: Normal pulses.          Popliteal pulses are 2+ on the right side and 2+ on the left side.       Dorsalis pedis pulses are 2+ on the right side and 2+ on the left side.  Pulmonary:     Effort: Pulmonary effort is normal.     Breath sounds: No wheezing or rales.  Abdominal:     General: Abdomen is flat.  Musculoskeletal:     Cervical back: Normal range of motion and neck supple.     Right lower leg: 1+ Pitting Edema present.     Left lower leg: 1+ Pitting Edema present.  Skin:    General: Skin is warm.  Neurological:     Mental Status: She is alert and oriented to person, place, and time.     ED Results / Procedures / Treatments   Labs (all labs  ordered are listed, but only abnormal results are displayed) Labs Reviewed  CBC WITH DIFFERENTIAL/PLATELET - Abnormal; Notable for the following components:      Result Value   RBC 3.76 (*)    HCT 35.6 (*)    All other components within normal limits  COMPREHENSIVE METABOLIC PANEL - Abnormal; Notable for the following components:   Glucose, Bld 280 (*)    Creatinine, Ser 1.37 (*)    Calcium 7.9 (*)    Total Protein 5.9 (*)    Albumin 2.4 (*)  GFR, Estimated 50 (*)    Anion gap 3 (*)    All other components within normal limits  BRAIN NATRIURETIC PEPTIDE    EKG EKG Interpretation  Date/Time:  Saturday July 21 2022 22:52:34 EDT Ventricular Rate:  74 PR Interval:  165 QRS Duration: 85 QT Interval:  412 QTC Calculation: 458 R Axis:   70 Text Interpretation: Sinus rhythm Confirmed by Fredia Sorrow [phone]) on 07/21/2022 10:55:49 PM  Radiology DG Chest 2 View  Result Date: 07/21/2022 CLINICAL DATA:  Shortness of breath EXAM: CHEST - 2 VIEW COMPARISON:  06/20/2022 FINDINGS: The heart size and mediastinal contours are within normal limits. Both lungs are clear. The visualized skeletal structures are unremarkable. IMPRESSION: No active cardiopulmonary disease. Electronically Signed   By: Inez Catalina M.D.   On: 07/21/2022 22:56    Procedures Procedures    Medications Ordered in ED Medications  furosemide (LASIX) injection 40 mg (40 mg Intravenous Given 07/21/22 2253)    ED Course/ Medical Decision Making/ A&P                           Medical Decision Making Amount and/or Complexity of Data Reviewed Labs: ordered. Radiology: ordered.  Risk Prescription drug management.   This patient presents to the ED for concern of bilateral leg swelling, this involves a number of treatment options, and is a complaint that carries with it a high risk of complications and morbidity.  The differential diagnosis includes DVT, cellulitis, peripheral edema.    Co  morbidities: Discussed in HPI   Brief History:  Patient here with ongoing bilateral leg swelling this been happening for several weeks.  Previously evaluated in the ED, sent home with a prescription of Lasix, reports she has been doubling her Lasix for about a week but there has not been any improvement in her symptoms.  She is followed by [facility] clinic.  She reports she has been having a hard time even obtaining her blood pressure medication at this time.  She has had DVT studies, x-rays and I have not been able to find any abnormality.  EMR reviewed including pt PMHx, past surgical history and past visits to ER.   See HPI for more details   Lab Tests:  I ordered and independently interpreted labs.  The pertinent results include:    I personally reviewed all laboratory work and imaging. Metabolic panel without any acute abnormality specifically kidney function within normal limits and no significant electrolyte abnormalities. CBC without leukocytosis or significant anemia. Her lites appear stable slight elevation in her creatinine.  BNP is normal.  Imaging Studies:  NAD. I personally reviewed all imaging studies and no acute abnormality found. I agree with radiology interpretation.    Cardiac Monitoring:  The patient was maintained on a cardiac monitor.  I personally viewed and interpreted the cardiac monitored which showed an underlying rhythm of: NSR EKG non-ischemic   Medicines ordered:  I ordered medication including lasix  for peripheral edema Reevaluation of the patient after these medicines showed that the patient stayed the same I have reviewed the patients home medicines and have made adjustments as needed  Reevaluation:  After the interventions noted above I re-evaluated patient and found that they have :improved   Social Determinants of Health:  The patient's social determinants of health were a factor in the care of this patient   Problem List / ED  Course:  Here with peripheral edema that is been ongoing  for several weeks.  Previously evaluated in the emergency department at [facility] prescription switched to Lasix.  She reports taking this medication and doubling the dose for about a week however swelling continues.  She has 1+ swelling to bilateral legs.  Labs are benign, test x-ray without any signs of pulmonary edema.  I discussed given her 1 dose of IV Lasix to help with her swelling however we did discuss that this problem is likely chronic and there would not be much change.  She is agreeable to follow-up with endocrinology.  She also has been out of her blood pressure medications she did arrived to the ED hypertensive, I discussed with her given her new prescription for losartan to help with her symptoms.  She is agreeable of plan and treatment at this time.  Patient hemodynamically stable for discharge.   Dispostion:  After consideration of the diagnostic results and the patients response to treatment, I feel that the patent would benefit from patient follow-up with endocrinologist, does have an appointment scheduled approximately 1 month from today.    Portions of this note were generated with [employer]. Dictation errors may occur despite best attempts at proofreading.  Final Clinical Impression(s) / ED Diagnoses Final diagnoses:  Peripheral edema    Rx / DC Orders ED Discharge Orders          Ordered    losartan (COZAAR) 50 MG tablet  Daily        07/21/22 2306              Janeece Fitting, PA-C 07/21/22 2317    Fredia Sorrow, MD 07/27/22 [phone]

## 2022-07-21 NOTE — ED Triage Notes (Signed)
Swelling to both legs and abdomen, has been seen her for same. States worse now.

## 2022-07-27 DIAGNOSIS — Z419 Encounter for procedure for purposes other than remedying health state, unspecified: Secondary | ICD-10-CM | POA: Diagnosis not present

## 2022-07-29 DIAGNOSIS — E1165 Type 2 diabetes mellitus with hyperglycemia: Secondary | ICD-10-CM | POA: Diagnosis not present

## 2022-07-29 DIAGNOSIS — Z6832 Body mass index (BMI) 32.0-32.9, adult: Secondary | ICD-10-CM | POA: Diagnosis not present

## 2022-07-29 DIAGNOSIS — I10 Essential (primary) hypertension: Secondary | ICD-10-CM | POA: Diagnosis not present

## 2022-07-29 DIAGNOSIS — R600 Localized edema: Secondary | ICD-10-CM | POA: Diagnosis not present

## 2022-08-01 DIAGNOSIS — R030 Elevated blood-pressure reading, without diagnosis of hypertension: Secondary | ICD-10-CM | POA: Diagnosis not present

## 2022-08-01 DIAGNOSIS — Z6832 Body mass index (BMI) 32.0-32.9, adult: Secondary | ICD-10-CM | POA: Diagnosis not present

## 2022-08-01 DIAGNOSIS — I10 Essential (primary) hypertension: Secondary | ICD-10-CM | POA: Diagnosis not present

## 2022-08-01 DIAGNOSIS — E669 Obesity, unspecified: Secondary | ICD-10-CM | POA: Diagnosis not present

## 2022-08-01 DIAGNOSIS — E1165 Type 2 diabetes mellitus with hyperglycemia: Secondary | ICD-10-CM | POA: Diagnosis not present

## 2022-08-01 DIAGNOSIS — R600 Localized edema: Secondary | ICD-10-CM | POA: Diagnosis not present

## 2022-08-08 DIAGNOSIS — Z743 Need for continuous supervision: Secondary | ICD-10-CM | POA: Diagnosis not present

## 2022-08-08 DIAGNOSIS — Y92410 Unspecified street and highway as the place of occurrence of the external cause: Secondary | ICD-10-CM | POA: Diagnosis not present

## 2022-08-08 DIAGNOSIS — Y999 Unspecified external cause status: Secondary | ICD-10-CM | POA: Diagnosis not present

## 2022-08-08 DIAGNOSIS — S29019A Strain of muscle and tendon of unspecified wall of thorax, initial encounter: Secondary | ICD-10-CM | POA: Diagnosis not present

## 2022-08-08 DIAGNOSIS — S161XXA Strain of muscle, fascia and tendon at neck level, initial encounter: Secondary | ICD-10-CM | POA: Diagnosis not present

## 2022-08-08 DIAGNOSIS — R739 Hyperglycemia, unspecified: Secondary | ICD-10-CM | POA: Diagnosis not present

## 2022-08-08 DIAGNOSIS — I10 Essential (primary) hypertension: Secondary | ICD-10-CM | POA: Diagnosis not present

## 2022-08-22 DIAGNOSIS — Z794 Long term (current) use of insulin: Secondary | ICD-10-CM | POA: Diagnosis not present

## 2022-08-22 DIAGNOSIS — R600 Localized edema: Secondary | ICD-10-CM | POA: Diagnosis not present

## 2022-08-22 DIAGNOSIS — E1165 Type 2 diabetes mellitus with hyperglycemia: Secondary | ICD-10-CM | POA: Diagnosis not present

## 2022-08-22 DIAGNOSIS — E1140 Type 2 diabetes mellitus with diabetic neuropathy, unspecified: Secondary | ICD-10-CM | POA: Diagnosis not present

## 2022-08-26 DIAGNOSIS — Z419 Encounter for procedure for purposes other than remedying health state, unspecified: Secondary | ICD-10-CM | POA: Diagnosis not present

## 2022-09-26 DIAGNOSIS — Z419 Encounter for procedure for purposes other than remedying health state, unspecified: Secondary | ICD-10-CM | POA: Diagnosis not present

## 2022-09-28 DIAGNOSIS — K219 Gastro-esophageal reflux disease without esophagitis: Secondary | ICD-10-CM | POA: Diagnosis not present

## 2022-09-28 DIAGNOSIS — E1165 Type 2 diabetes mellitus with hyperglycemia: Secondary | ICD-10-CM | POA: Diagnosis not present

## 2022-09-28 DIAGNOSIS — R110 Nausea: Secondary | ICD-10-CM | POA: Diagnosis not present

## 2022-10-01 DIAGNOSIS — N611 Abscess of the breast and nipple: Secondary | ICD-10-CM | POA: Diagnosis not present

## 2022-10-03 ENCOUNTER — Other Ambulatory Visit: Payer: Self-pay | Admitting: Physician Assistant

## 2022-10-03 DIAGNOSIS — N611 Abscess of the breast and nipple: Secondary | ICD-10-CM

## 2022-10-12 DIAGNOSIS — B079 Viral wart, unspecified: Secondary | ICD-10-CM | POA: Diagnosis not present

## 2022-10-12 DIAGNOSIS — L732 Hidradenitis suppurativa: Secondary | ICD-10-CM | POA: Diagnosis not present

## 2022-10-12 DIAGNOSIS — L209 Atopic dermatitis, unspecified: Secondary | ICD-10-CM | POA: Diagnosis not present

## 2022-10-12 DIAGNOSIS — L720 Epidermal cyst: Secondary | ICD-10-CM | POA: Diagnosis not present

## 2022-10-26 DIAGNOSIS — Z6832 Body mass index (BMI) 32.0-32.9, adult: Secondary | ICD-10-CM | POA: Diagnosis not present

## 2022-10-26 DIAGNOSIS — N1831 Chronic kidney disease, stage 3a: Secondary | ICD-10-CM | POA: Diagnosis not present

## 2022-10-26 DIAGNOSIS — R030 Elevated blood-pressure reading, without diagnosis of hypertension: Secondary | ICD-10-CM | POA: Diagnosis not present

## 2022-10-26 DIAGNOSIS — E1165 Type 2 diabetes mellitus with hyperglycemia: Secondary | ICD-10-CM | POA: Diagnosis not present

## 2022-10-26 DIAGNOSIS — I10 Essential (primary) hypertension: Secondary | ICD-10-CM | POA: Diagnosis not present

## 2022-10-26 DIAGNOSIS — Z419 Encounter for procedure for purposes other than remedying health state, unspecified: Secondary | ICD-10-CM | POA: Diagnosis not present

## 2022-10-30 ENCOUNTER — Other Ambulatory Visit: Payer: Medicaid Other

## 2022-11-11 ENCOUNTER — Encounter (HOSPITAL_BASED_OUTPATIENT_CLINIC_OR_DEPARTMENT_OTHER): Payer: Self-pay | Admitting: Emergency Medicine

## 2022-11-11 ENCOUNTER — Emergency Department (HOSPITAL_BASED_OUTPATIENT_CLINIC_OR_DEPARTMENT_OTHER)
Admission: EM | Admit: 2022-11-11 | Discharge: 2022-11-11 | Disposition: A | Discharge status: Home / Self Care | Payer: Medicaid Other | Attending: Emergency Medicine | Admitting: Emergency Medicine

## 2022-11-11 ENCOUNTER — Other Ambulatory Visit: Payer: Self-pay

## 2022-11-11 ENCOUNTER — Emergency Department (HOSPITAL_BASED_OUTPATIENT_CLINIC_OR_DEPARTMENT_OTHER): Payer: Medicaid Other

## 2022-11-11 DIAGNOSIS — J449 Chronic obstructive pulmonary disease, unspecified: Secondary | ICD-10-CM | POA: Diagnosis not present

## 2022-11-11 DIAGNOSIS — Z20822 Contact with and (suspected) exposure to covid-19: Secondary | ICD-10-CM | POA: Diagnosis not present

## 2022-11-11 DIAGNOSIS — J4521 Mild intermittent asthma with (acute) exacerbation: Secondary | ICD-10-CM | POA: Diagnosis not present

## 2022-11-11 DIAGNOSIS — E876 Hypokalemia: Secondary | ICD-10-CM | POA: Diagnosis not present

## 2022-11-11 DIAGNOSIS — Z7951 Long term (current) use of inhaled steroids: Secondary | ICD-10-CM | POA: Diagnosis not present

## 2022-11-11 DIAGNOSIS — R000 Tachycardia, unspecified: Secondary | ICD-10-CM | POA: Insufficient documentation

## 2022-11-11 DIAGNOSIS — R072 Precordial pain: Secondary | ICD-10-CM | POA: Diagnosis present

## 2022-11-11 DIAGNOSIS — B338 Other specified viral diseases: Secondary | ICD-10-CM

## 2022-11-11 DIAGNOSIS — R0789 Other chest pain: Secondary | ICD-10-CM | POA: Diagnosis not present

## 2022-11-11 DIAGNOSIS — B974 Respiratory syncytial virus as the cause of diseases classified elsewhere: Secondary | ICD-10-CM | POA: Diagnosis not present

## 2022-11-11 HISTORY — DX: Emphysema, unspecified: J43.9

## 2022-11-11 LAB — RESP PANEL BY RT-PCR (RSV, FLU A&B, COVID)  RVPGX2
Influenza A by PCR: NEGATIVE
Influenza B by PCR: NEGATIVE
Resp Syncytial Virus by PCR: POSITIVE — AB
SARS Coronavirus 2 by RT PCR: NEGATIVE

## 2022-11-11 LAB — CBC
HCT: 36.3 % (ref 36.0–46.0)
Hemoglobin: 12.8 g/dL (ref 12.0–15.0)
MCH: 33 pg (ref 26.0–34.0)
MCHC: 35.3 g/dL (ref 30.0–36.0)
MCV: 93.6 fL (ref 80.0–100.0)
Platelets: 203 10*3/uL (ref 150–400)
RBC: 3.88 MIL/uL (ref 3.87–5.11)
RDW: 11.9 % (ref 11.5–15.5)
WBC: 6.3 10*3/uL (ref 4.0–10.5)
nRBC: 0 % (ref 0.0–0.2)

## 2022-11-11 LAB — PREGNANCY, URINE: Preg Test, Ur: NEGATIVE

## 2022-11-11 LAB — BASIC METABOLIC PANEL
Anion gap: 6 (ref 5–15)
BUN: 22 mg/dL — ABNORMAL HIGH (ref 6–20)
CO2: 24 mmol/L (ref 22–32)
Calcium: 8.1 mg/dL — ABNORMAL LOW (ref 8.9–10.3)
Chloride: 105 mmol/L (ref 98–111)
Creatinine, Ser: 1.28 mg/dL — ABNORMAL HIGH (ref 0.44–1.00)
GFR, Estimated: 54 mL/min — ABNORMAL LOW (ref >60)
Glucose, Bld: 232 mg/dL — ABNORMAL HIGH (ref 70–99)
Potassium: 3.4 mmol/L — ABNORMAL LOW (ref 3.5–5.1)
Sodium: 135 mmol/L (ref 135–145)

## 2022-11-11 LAB — TROPONIN I (HIGH SENSITIVITY)
Troponin I (High Sensitivity): 3 ng/L (ref <18)
Troponin I (High Sensitivity): 3 ng/L (ref <18)

## 2022-11-11 MED ORDER — ALBUTEROL SULFATE HFA 108 (90 BASE) MCG/ACT IN AERS: 1.0000 | INHALATION_SPRAY | Freq: Four times a day (QID) | RESPIRATORY_TRACT | 0 refills | Status: DC | PRN

## 2022-11-11 MED ORDER — ALBUTEROL SULFATE (2.5 MG/3ML) 0.083% IN NEBU
2.5000 mg | INHALATION_SOLUTION | Freq: Once | RESPIRATORY_TRACT | Status: AC
  Administered 2022-11-11: 2.5 mg via RESPIRATORY_TRACT
  Filled 2022-11-11: qty 3

## 2022-11-11 MED ORDER — IPRATROPIUM BROMIDE 0.02 % IN SOLN
0.5000 mg | Freq: Once | RESPIRATORY_TRACT | Status: AC
  Administered 2022-11-11: 0.5 mg via RESPIRATORY_TRACT
  Filled 2022-11-11: qty 2.5

## 2022-11-11 MED ORDER — IPRATROPIUM-ALBUTEROL 0.5-2.5 (3) MG/3ML IN SOLN
3.0000 mL | Freq: Once | RESPIRATORY_TRACT | Status: AC
  Administered 2022-11-11: 3 mL via RESPIRATORY_TRACT
  Filled 2022-11-11: qty 3

## 2022-11-11 MED ORDER — MAGNESIUM SULFATE 50 % IJ SOLN
2.0000 g | Freq: Once | INTRAMUSCULAR | Status: AC
  Administered 2022-11-11: 2 g via INTRAVENOUS
  Filled 2022-11-11: qty 4

## 2022-11-11 MED ORDER — ALBUTEROL SULFATE (2.5 MG/3ML) 0.083% IN NEBU
5.0000 mg | INHALATION_SOLUTION | Freq: Once | RESPIRATORY_TRACT | Status: AC
  Administered 2022-11-11: 5 mg via RESPIRATORY_TRACT
  Filled 2022-11-11: qty 6

## 2022-11-11 NOTE — ED Notes (Signed)
Pt aware urine sample is needed, unable to void at this time, advised to call out when she is able to voif, pt verbalized understanding

## 2022-11-11 NOTE — ED Provider Notes (Signed)
[place] [facility] Provider Note   CSN: [id number] Arrival date & time: 11/11/22  0019     History  Chief Complaint  Patient presents with   Chest Pain    Renee Bryant is a 41 y.o. female.  The history is provided by the patient.  Chest Pain Pain location:  Substernal area Pain quality: tightness   Pain radiates to:  Does not radiate Pain severity:  Moderate Onset quality:  Gradual Duration: days. Timing:  Constant Progression:  Unchanged Chronicity:  Recurrent Context comment:  Wheezing and cough Relieved by:  Nothing Worsened by:  Nothing Ineffective treatments: home albuterol and budesonide. Associated symptoms: cough   Associated symptoms: no fever   Risk factors: no coronary artery disease and no prior DVT/PE   Patient with asthma and COPD who presents with wheezing and cough.       Home Medications Prior to Admission medications   Medication Sig Start Date End Date Taking? Authorizing Provider  albuterol (VENTOLIN HFA) 108 (90 Base) MCG/ACT inhaler Inhale 1-2 puffs into the lungs every 6 (six) hours as needed for wheezing or shortness of breath. 11/11/22  Yes Clent Damore, MD  albuterol (PROVENTIL HFA;VENTOLIN HFA) 108 (90 Base) MCG/ACT inhaler Inhale 2 puffs into the lungs every 4 (four) hours as needed for wheezing or shortness of breath. 02/03/19   Volanda Napoleon, PA-C  atorvastatin (LIPITOR) 40 MG tablet Take by mouth. 09/24/20   [provider]  bictegravir-emtricitabine-tenofovir AF (BIKTARVY) 50-200-25 MG TABS tablet Take by mouth daily.    [provider]  cephALEXin (KEFLEX) 500 MG capsule Take 1 capsule (500 mg total) by mouth 4 (four) times daily. 05/05/19   Jezebel Pollet, MD  clonazePAM (KLONOPIN) 0.5 MG tablet Take 1 mg by mouth 2 (two) times daily.     [provider]  Dulaglutide (TRULICITY) 3 TW/6.5KC SOPN INJECT 1 PEN UNDER THE SKIN ONCE A WEEK 09/26/20   [provider]   Elviteg-Cobic-Emtricit-TenofAF (GENVOYA PO) Take by mouth.    [provider]  ergocalciferol (VITAMIN D2) 1.25 MG (50000 UT) capsule Take 1 capsule twice a week. 10/09/19   [provider]  GABAPENTIN PO Take by mouth.    [provider]  glipiZIDE (GLUCOTROL) 10 MG tablet Take by mouth. 10/06/15   [provider]  ibuprofen (ADVIL,MOTRIN) 800 MG tablet Take 1 tablet (800 mg total) by mouth every 8 (eight) hours as needed. 11/17/17   Long, Wonda Olds, MD  insulin glargine (LANTUS) 100 UNIT/ML injection Inject 18 Units into the skin at bedtime.    [provider]  lidocaine (LIDODERM) 5 % Place 1 patch onto the skin daily. Remove & Discard patch within 12 hours or as directed by MD 04/25/19   Petrucelli, Aldona Bar R, PA-C  losartan (COZAAR) 50 MG tablet Take 1 tablet (50 mg total) by mouth daily. 07/21/22 08/20/22  Janeece Fitting, PA-C  methocarbamol (ROBAXIN) 500 MG tablet Take 1 tablet (500 mg total) by mouth every 8 (eight) hours as needed. 04/25/19   Petrucelli, Samantha R, PA-C  metroNIDAZOLE (FLAGYL) 500 MG tablet Take 1 tablet (500 mg total) by mouth 2 (two) times daily. 04/30/22   Prosperi, Christian H, PA-C  naproxen (NAPROSYN) 500 MG tablet Take 1 tablet (500 mg total) by mouth 2 (two) times daily. 12/05/16   Carlisle Cater, PA-C  omeprazole (PRILOSEC) 20 MG capsule Take 20 mg by mouth daily.    [provider]  oxyCODONE (ROXICODONE) 5 MG immediate release tablet  Take 1 tablet (5 mg total) by mouth every 6 (six) hours as needed for severe pain. 02/01/44   Delora Fuel, MD  phenazopyridine (PYRIDIUM) 200 MG tablet Take 1 tablet (200 mg total) by mouth 3 (three) times daily. 05/05/19   Keijuan Schellhase, MD  Rimegepant Sulfate (NURTEC) 75 MG TBDP DISSOLVE 1 TABLET ON THE TONGUE DAILY AS NEEDED 09/23/20   [provider]  valACYclovir (VALTREX) 500 MG tablet Take 500 mg by mouth 2 (two) times daily as needed.    [provider]       Allergies    Metformin and related, Wellbutrin [bupropion], and Trulicity [dulaglutide]    Review of Systems   Review of Systems  Constitutional:  Negative for fever.  HENT:  Negative for facial swelling.   Eyes:  Negative for redness.  Respiratory:  Positive for cough and wheezing.   Cardiovascular:  Positive for chest pain.  All other systems reviewed and are negative.   Physical Exam Updated Vital Signs BP 136/87   Pulse (!) 103   Temp 98.2 F (36.8 C) (Oral)   Resp 11   Ht '5\' 7"'$  (1.702 m)   Wt 87.1 kg   LMP 11/08/2022   SpO2 93%   BMI 30.07 kg/m  Physical Exam Vitals and nursing note reviewed.  Constitutional:      General: She is not in acute distress.    Appearance: Normal appearance. She is well-developed.  HENT:     Head: Normocephalic and atraumatic.     Nose: Nose normal.  Eyes:     Pupils: Pupils are equal, round, and reactive to light.  Cardiovascular:     Rate and Rhythm: Normal rate and regular rhythm.     Pulses: Normal pulses.     Heart sounds: Normal heart sounds.  Pulmonary:     Effort: Pulmonary effort is normal. No respiratory distress.     Breath sounds: Wheezing present.  Abdominal:     General: Bowel sounds are normal. There is no distension.     Palpations: Abdomen is soft.     Tenderness: There is no abdominal tenderness. There is no guarding or rebound.  Genitourinary:    Vagina: No vaginal discharge.  Musculoskeletal:        General: No tenderness. Normal range of motion.     Cervical back: Neck supple.     Right lower leg: No edema.     Left lower leg: No edema.  Skin:    General: Skin is warm and dry.     Capillary Refill: Capillary refill takes less than 2 seconds.     Findings: No erythema or rash.  Neurological:     General: No focal deficit present.     Mental Status: She is alert and oriented to person, place, and time.     Deep Tendon Reflexes: Reflexes normal.  Psychiatric:        Mood and Affect: Mood normal.         Behavior: Behavior normal.     ED Results / Procedures / Treatments   Labs (all labs ordered are listed, but only abnormal results are displayed) Results for orders placed or performed during the hospital encounter of 11/11/22  Resp panel by RT-PCR (RSV, Flu A&B, Covid) Anterior Nasal Swab   Specimen: Anterior Nasal Swab  Result Value Ref Range   SARS Coronavirus 2 by RT PCR NEGATIVE NEGATIVE   Influenza A by PCR NEGATIVE NEGATIVE   Influenza B by PCR NEGATIVE NEGATIVE  Resp Syncytial Virus by PCR POSITIVE (A) NEGATIVE  Basic metabolic panel  Result Value Ref Range   Sodium 135 135 - 145 mmol/L   Potassium 3.4 (L) 3.5 - 5.1 mmol/L   Chloride 105 98 - 111 mmol/L   CO2 24 22 - 32 mmol/L   Glucose, Bld 232 (H) 70 - 99 mg/dL   BUN 22 (H) 6 - 20 mg/dL   Creatinine, Ser 1.28 (H) 0.44 - 1.00 mg/dL   Calcium 8.1 (L) 8.9 - 10.3 mg/dL   GFR, Estimated 54 (L) >60 mL/min   Anion gap 6 5 - 15  CBC  Result Value Ref Range   WBC 6.3 4.0 - 10.5 K/uL   RBC 3.88 3.87 - 5.11 MIL/uL   Hemoglobin 12.8 12.0 - 15.0 g/dL   HCT 36.3 36.0 - 46.0 %   MCV 93.6 80.0 - 100.0 fL   MCH 33.0 26.0 - 34.0 pg   MCHC 35.3 30.0 - 36.0 g/dL   RDW 11.9 11.5 - 15.5 %   Platelets 203 150 - 400 K/uL   nRBC 0.0 0.0 - 0.2 %  Pregnancy, urine  Result Value Ref Range   Preg Test, Ur NEGATIVE NEGATIVE  Troponin I (High Sensitivity)  Result Value Ref Range   Troponin I (High Sensitivity) 3 <18 ng/L   DG Chest 2 View  Result Date: 11/11/2022 CLINICAL DATA:  Chest pain and shortness of breath EXAM: CHEST - 2 VIEW COMPARISON:  Radiographs 07/21/2022 FINDINGS: The heart size and mediastinal contours are within normal limits. Both lungs are clear. The visualized skeletal structures are unremarkable. IMPRESSION: No active cardiopulmonary disease. Electronically Signed   By: Placido Sou M.D.   On: 11/11/2022 01:36    EKG EKG Interpretation  Date/Time:  Sunday November 11 2022 00:46:50 EST Ventricular Rate:   101 PR Interval:  156 QRS Duration: 76 QT Interval:  362 QTC Calculation: 469 R Axis:   25 Text Interpretation: Sinus tachycardia Confirmed by Dory Horn) on 11/11/2022 1:05:13 AM  Radiology DG Chest 2 View  Result Date: 11/11/2022 CLINICAL DATA:  Chest pain and shortness of breath EXAM: CHEST - 2 VIEW COMPARISON:  Radiographs 07/21/2022 FINDINGS: The heart size and mediastinal contours are within normal limits. Both lungs are clear. The visualized skeletal structures are unremarkable. IMPRESSION: No active cardiopulmonary disease. Electronically Signed   By: Placido Sou M.D.   On: 11/11/2022 01:36    Procedures Procedures    Medications Ordered in ED Medications  ipratropium-albuterol (DUONEB) 0.5-2.5 (3) MG/3ML nebulizer solution 3 mL (3 mLs Nebulization Given 11/11/22 0122)  albuterol (PROVENTIL) (2.5 MG/3ML) 0.083% nebulizer solution 2.5 mg (2.5 mg Nebulization Given 11/11/22 0122)  albuterol (PROVENTIL) (2.5 MG/3ML) 0.083% nebulizer solution 5 mg (5 mg Nebulization Given 11/11/22 0213)  ipratropium (ATROVENT) nebulizer solution 0.5 mg (0.5 mg Nebulization Given 11/11/22 0214)  magnesium sulfate (IV Push/IM) injection 2 g (2 g Intravenous Given 11/11/22 0256)    ED Course/ Medical Decision Making/ A&P                           Medical Decision Making Patient with asthma and COPD who presents with cough and wheezing   Amount and/or Complexity of Data Reviewed External Data Reviewed: notes.    Details: Previous notes reviewed.   Labs: ordered.    Details: All labs reviewed: troponin is normal at 3.  Negative covid and flu, positive RSV.  Normal sodium 135, low potassium 3.4, normal  creatinine.  Normal white count 6.3 and normal hemoglobin 12.8, normal platelets  Radiology: ordered and independent interpretation performed.    Details: Negative CXR by me  ECG/medicine tests: ordered and independent interpretation performed. Decision-making details documented in  ED Course.  Risk Prescription drug management. Risk Details: Patient with asthma/COPD with wheezing.  Likely brought on by RSV.  Clear post nebs and magnesium.  Feeling improved.  Vital signs are normal.  Continued home nebulizers.  Follow up with your PMD.  Strict return.     Final Clinical Impression(s) / ED Diagnoses Final diagnoses:  RSV (respiratory syncytial virus infection)  Mild intermittent asthma with acute exacerbation   Return for intractable cough, coughing up blood, fevers > 100.4 unrelieved by medication, shortness of breath, intractable vomiting, chest pain, shortness of breath, weakness, numbness, changes in speech, facial asymmetry, abdominal pain, passing out, Inability to tolerate liquids or food, cough, altered mental status or any concerns. No signs of systemic illness or infection. The patient is nontoxic-appearing on exam and vital signs are within normal limits.  I have reviewed the triage vital signs and the nursing notes. Pertinent labs & imaging results that were available during my care of the patient were reviewed by me and considered in my medical decision making (see chart for details). After history, exam, and medical workup I feel the patient has been appropriately medically screened and is safe for discharge home. Pertinent diagnoses were discussed with the patient. Patient was given return precautions.  Rx / DC Orders ED Discharge Orders          Ordered    albuterol (VENTOLIN HFA) 108 (90 Base) MCG/ACT inhaler  Every 6 hours PRN        11/11/22 0345              Grasiela Jonsson, MD 11/11/22 9675

## 2022-11-11 NOTE — ED Triage Notes (Signed)
SHOB x "a couple days", chest pain and blurry vision x ~1 hour. Pt reports hx of asthma and bronchitis. States she took a "breathing treatment" ~3 hours ago. RRT called to triage to assess.

## 2022-11-11 NOTE — ED Notes (Signed)
Patient transported to X-ray 

## 2022-11-26 DIAGNOSIS — Z419 Encounter for procedure for purposes other than remedying health state, unspecified: Secondary | ICD-10-CM | POA: Diagnosis not present

## 2022-11-29 DIAGNOSIS — N049 Nephrotic syndrome with unspecified morphologic changes: Secondary | ICD-10-CM | POA: Diagnosis not present

## 2022-11-29 DIAGNOSIS — E8779 Other fluid overload: Secondary | ICD-10-CM | POA: Diagnosis not present

## 2022-11-29 DIAGNOSIS — Z791 Long term (current) use of non-steroidal anti-inflammatories (NSAID): Secondary | ICD-10-CM | POA: Diagnosis not present

## 2022-11-29 DIAGNOSIS — E876 Hypokalemia: Secondary | ICD-10-CM | POA: Diagnosis not present

## 2022-11-29 DIAGNOSIS — R801 Persistent proteinuria, unspecified: Secondary | ICD-10-CM | POA: Diagnosis not present

## 2022-11-29 DIAGNOSIS — R600 Localized edema: Secondary | ICD-10-CM | POA: Diagnosis not present

## 2022-11-29 DIAGNOSIS — N179 Acute kidney failure, unspecified: Secondary | ICD-10-CM | POA: Diagnosis not present

## 2022-12-12 ENCOUNTER — Emergency Department (HOSPITAL_BASED_OUTPATIENT_CLINIC_OR_DEPARTMENT_OTHER)
Admission: EM | Admit: 2022-12-12 | Discharge: 2022-12-12 | Disposition: A | Discharge status: Home / Self Care | Payer: Medicaid Other | Attending: Emergency Medicine | Admitting: Emergency Medicine

## 2022-12-12 ENCOUNTER — Other Ambulatory Visit: Payer: Self-pay

## 2022-12-12 ENCOUNTER — Encounter (HOSPITAL_BASED_OUTPATIENT_CLINIC_OR_DEPARTMENT_OTHER): Payer: Self-pay | Admitting: Radiology

## 2022-12-12 ENCOUNTER — Emergency Department (HOSPITAL_BASED_OUTPATIENT_CLINIC_OR_DEPARTMENT_OTHER): Payer: Medicaid Other

## 2022-12-12 DIAGNOSIS — Z21 Asymptomatic human immunodeficiency virus [HIV] infection status: Secondary | ICD-10-CM | POA: Diagnosis not present

## 2022-12-12 DIAGNOSIS — K6389 Other specified diseases of intestine: Secondary | ICD-10-CM | POA: Diagnosis not present

## 2022-12-12 DIAGNOSIS — R109 Unspecified abdominal pain: Secondary | ICD-10-CM

## 2022-12-12 DIAGNOSIS — R0789 Other chest pain: Secondary | ICD-10-CM | POA: Diagnosis not present

## 2022-12-12 DIAGNOSIS — Z9049 Acquired absence of other specified parts of digestive tract: Secondary | ICD-10-CM | POA: Diagnosis not present

## 2022-12-12 DIAGNOSIS — D259 Leiomyoma of uterus, unspecified: Secondary | ICD-10-CM | POA: Diagnosis not present

## 2022-12-12 DIAGNOSIS — R079 Chest pain, unspecified: Secondary | ICD-10-CM | POA: Diagnosis not present

## 2022-12-12 DIAGNOSIS — K3189 Other diseases of stomach and duodenum: Secondary | ICD-10-CM | POA: Diagnosis not present

## 2022-12-12 LAB — CBC WITH DIFFERENTIAL/PLATELET
Abs Immature Granulocytes: 0.02 10*3/uL (ref 0.00–0.07)
Basophils Absolute: 0.1 10*3/uL (ref 0.0–0.1)
Basophils Relative: 1 %
Eosinophils Absolute: 0.4 10*3/uL (ref 0.0–0.5)
Eosinophils Relative: 5 %
HCT: 35.9 % — ABNORMAL LOW (ref 36.0–46.0)
Hemoglobin: 12.9 g/dL (ref 12.0–15.0)
Immature Granulocytes: 0 %
Lymphocytes Relative: 35 %
Lymphs Abs: 2.8 10*3/uL (ref 0.7–4.0)
MCH: 32.8 pg (ref 26.0–34.0)
MCHC: 35.9 g/dL (ref 30.0–36.0)
MCV: 91.3 fL (ref 80.0–100.0)
Monocytes Absolute: 0.7 10*3/uL (ref 0.1–1.0)
Monocytes Relative: 9 %
Neutro Abs: 3.9 10*3/uL (ref 1.7–7.7)
Neutrophils Relative %: 50 %
Platelets: 247 10*3/uL (ref 150–400)
RBC: 3.93 MIL/uL (ref 3.87–5.11)
RDW: 11.8 % (ref 11.5–15.5)
WBC: 7.9 10*3/uL (ref 4.0–10.5)
nRBC: 0 % (ref 0.0–0.2)

## 2022-12-12 LAB — URINALYSIS, MICROSCOPIC (REFLEX)

## 2022-12-12 LAB — COMPREHENSIVE METABOLIC PANEL
ALT: 19 U/L (ref 0–44)
AST: 17 U/L (ref 15–41)
Albumin: 2.3 g/dL — ABNORMAL LOW (ref 3.5–5.0)
Alkaline Phosphatase: 62 U/L (ref 38–126)
Anion gap: 6 (ref 5–15)
BUN: 30 mg/dL — ABNORMAL HIGH (ref 6–20)
CO2: 24 mmol/L (ref 22–32)
Calcium: 8.3 mg/dL — ABNORMAL LOW (ref 8.9–10.3)
Chloride: 101 mmol/L (ref 98–111)
Creatinine, Ser: 1.12 mg/dL — ABNORMAL HIGH (ref 0.44–1.00)
GFR, Estimated: >60 mL/min (ref >60)
Glucose, Bld: 419 mg/dL — ABNORMAL HIGH (ref 70–99)
Potassium: 4.3 mmol/L (ref 3.5–5.1)
Sodium: 131 mmol/L — ABNORMAL LOW (ref 135–145)
Total Bilirubin: 0.4 mg/dL (ref 0.3–1.2)
Total Protein: 6 g/dL — ABNORMAL LOW (ref 6.5–8.1)

## 2022-12-12 LAB — URINALYSIS, ROUTINE W REFLEX MICROSCOPIC
Bilirubin Urine: NEGATIVE
Glucose, UA: >=500 mg/dL — AB
Ketones, ur: NEGATIVE mg/dL
Leukocytes,Ua: NEGATIVE
Nitrite: NEGATIVE
Protein, ur: >=300 mg/dL — AB
Specific Gravity, Urine: 1.02 (ref 1.005–1.030)
pH: 6 (ref 5.0–8.0)

## 2022-12-12 LAB — LIPASE, BLOOD: Lipase: 61 U/L — ABNORMAL HIGH (ref 11–51)

## 2022-12-12 LAB — PREGNANCY, URINE: Preg Test, Ur: NEGATIVE

## 2022-12-12 MED ORDER — INSULIN ASPART 100 UNIT/ML IJ SOLN
4.0000 [IU] | INTRAMUSCULAR | Status: AC
  Administered 2022-12-12: 4 [IU] via SUBCUTANEOUS

## 2022-12-12 MED ORDER — IOHEXOL 350 MG/ML SOLN
100.0000 mL | Freq: Once | INTRAVENOUS | Status: AC | PRN
  Administered 2022-12-12: 100 mL via INTRAVENOUS

## 2022-12-12 MED ORDER — OXYCODONE HCL 5 MG PO TABS
5.0000 mg | ORAL_TABLET | ORAL | Status: AC
  Administered 2022-12-12: 5 mg via ORAL
  Filled 2022-12-12: qty 1

## 2022-12-12 MED ORDER — LIDOCAINE 5 % EX PTCH: 1.0000 | MEDICATED_PATCH | CUTANEOUS | 0 refills | Status: DC

## 2022-12-12 MED ORDER — ACETAMINOPHEN 500 MG PO TABS
1000.0000 mg | ORAL_TABLET | ORAL | Status: AC
  Administered 2022-12-12: 1000 mg via ORAL
  Filled 2022-12-12: qty 2

## 2022-12-12 MED ORDER — LIDOCAINE 5 % EX PTCH
1.0000 | MEDICATED_PATCH | CUTANEOUS | Status: DC
  Administered 2022-12-12: 1 via TRANSDERMAL
  Filled 2022-12-12: qty 1
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
## 2022-12-12 NOTE — ED Triage Notes (Signed)
Pt c/o left sided flank pain that radiates to abdomen and nausea. Pain started 2 days ago. Pain worse when taking a deep breath.  Pt currently taking Ozempic.

## 2022-12-13 DIAGNOSIS — G894 Chronic pain syndrome: Secondary | ICD-10-CM | POA: Diagnosis not present

## 2022-12-13 DIAGNOSIS — G629 Polyneuropathy, unspecified: Secondary | ICD-10-CM | POA: Diagnosis not present

## 2022-12-13 DIAGNOSIS — I10 Essential (primary) hypertension: Secondary | ICD-10-CM | POA: Diagnosis not present

## 2022-12-13 DIAGNOSIS — E1165 Type 2 diabetes mellitus with hyperglycemia: Secondary | ICD-10-CM | POA: Diagnosis not present

## 2022-12-13 DIAGNOSIS — E1142 Type 2 diabetes mellitus with diabetic polyneuropathy: Secondary | ICD-10-CM | POA: Diagnosis not present

## 2022-12-13 DIAGNOSIS — M79674 Pain in right toe(s): Secondary | ICD-10-CM | POA: Diagnosis not present

## 2022-12-13 DIAGNOSIS — B20 Human immunodeficiency virus [HIV] disease: Secondary | ICD-10-CM | POA: Diagnosis not present

## 2022-12-13 DIAGNOSIS — Z7689 Persons encountering health services in other specified circumstances: Secondary | ICD-10-CM | POA: Diagnosis not present

## 2022-12-13 DIAGNOSIS — K219 Gastro-esophageal reflux disease without esophagitis: Secondary | ICD-10-CM | POA: Diagnosis not present

## 2022-12-13 DIAGNOSIS — Z794 Long term (current) use of insulin: Secondary | ICD-10-CM | POA: Diagnosis not present

## 2022-12-14 DIAGNOSIS — B20 Human immunodeficiency virus [HIV] disease: Secondary | ICD-10-CM | POA: Diagnosis not present

## 2022-12-27 DIAGNOSIS — L600 Ingrowing nail: Secondary | ICD-10-CM | POA: Diagnosis not present

## 2022-12-27 DIAGNOSIS — E1165 Type 2 diabetes mellitus with hyperglycemia: Secondary | ICD-10-CM | POA: Diagnosis not present

## 2022-12-27 DIAGNOSIS — Z794 Long term (current) use of insulin: Secondary | ICD-10-CM | POA: Diagnosis not present

## 2022-12-27 DIAGNOSIS — Z419 Encounter for procedure for purposes other than remedying health state, unspecified: Secondary | ICD-10-CM | POA: Diagnosis not present

## 2022-12-27 DIAGNOSIS — B351 Tinea unguium: Secondary | ICD-10-CM | POA: Diagnosis not present

## 2023-01-02 DIAGNOSIS — R6882 Decreased libido: Secondary | ICD-10-CM | POA: Diagnosis not present

## 2023-01-02 DIAGNOSIS — N898 Other specified noninflammatory disorders of vagina: Secondary | ICD-10-CM | POA: Diagnosis not present

## 2023-01-02 DIAGNOSIS — N938 Other specified abnormal uterine and vaginal bleeding: Secondary | ICD-10-CM | POA: Diagnosis not present

## 2023-01-17 DIAGNOSIS — Z6830 Body mass index (BMI) 30.0-30.9, adult: Secondary | ICD-10-CM | POA: Diagnosis not present

## 2023-01-17 DIAGNOSIS — N3946 Mixed incontinence: Secondary | ICD-10-CM | POA: Diagnosis not present

## 2023-01-17 DIAGNOSIS — E1165 Type 2 diabetes mellitus with hyperglycemia: Secondary | ICD-10-CM | POA: Diagnosis not present

## 2023-01-17 DIAGNOSIS — I10 Essential (primary) hypertension: Secondary | ICD-10-CM | POA: Diagnosis not present

## 2023-01-17 DIAGNOSIS — R159 Full incontinence of feces: Secondary | ICD-10-CM | POA: Diagnosis not present

## 2023-01-25 DIAGNOSIS — Z419 Encounter for procedure for purposes other than remedying health state, unspecified: Secondary | ICD-10-CM | POA: Diagnosis not present

## 2023-01-28 DIAGNOSIS — I10 Essential (primary) hypertension: Secondary | ICD-10-CM | POA: Diagnosis not present

## 2023-01-28 DIAGNOSIS — E1142 Type 2 diabetes mellitus with diabetic polyneuropathy: Secondary | ICD-10-CM | POA: Diagnosis not present

## 2023-01-28 DIAGNOSIS — Z79899 Other long term (current) drug therapy: Secondary | ICD-10-CM | POA: Diagnosis not present

## 2023-01-31 DIAGNOSIS — L728 Other follicular cysts of the skin and subcutaneous tissue: Secondary | ICD-10-CM | POA: Diagnosis not present

## 2023-01-31 DIAGNOSIS — L720 Epidermal cyst: Secondary | ICD-10-CM | POA: Diagnosis not present

## 2023-02-25 DIAGNOSIS — Z419 Encounter for procedure for purposes other than remedying health state, unspecified: Secondary | ICD-10-CM | POA: Diagnosis not present

## 2023-02-26 DIAGNOSIS — Z794 Long term (current) use of insulin: Secondary | ICD-10-CM | POA: Diagnosis not present

## 2023-02-26 DIAGNOSIS — H35043 Retinal micro-aneurysms, unspecified, bilateral: Secondary | ICD-10-CM | POA: Diagnosis not present

## 2023-02-26 DIAGNOSIS — H52203 Unspecified astigmatism, bilateral: Secondary | ICD-10-CM | POA: Diagnosis not present

## 2023-02-26 DIAGNOSIS — H5213 Myopia, bilateral: Secondary | ICD-10-CM | POA: Diagnosis not present

## 2023-02-26 DIAGNOSIS — E113513 Type 2 diabetes mellitus with proliferative diabetic retinopathy with macular edema, bilateral: Secondary | ICD-10-CM | POA: Diagnosis not present

## 2023-03-12 ENCOUNTER — Emergency Department (HOSPITAL_BASED_OUTPATIENT_CLINIC_OR_DEPARTMENT_OTHER)
Admission: EM | Admit: 2023-03-12 | Discharge: 2023-03-13 | Disposition: A | Discharge status: Home / Self Care | Payer: Medicaid Other | Attending: Emergency Medicine | Admitting: Emergency Medicine

## 2023-03-12 ENCOUNTER — Other Ambulatory Visit: Payer: Self-pay

## 2023-03-12 ENCOUNTER — Encounter (HOSPITAL_BASED_OUTPATIENT_CLINIC_OR_DEPARTMENT_OTHER): Payer: Self-pay

## 2023-03-12 ENCOUNTER — Emergency Department (HOSPITAL_BASED_OUTPATIENT_CLINIC_OR_DEPARTMENT_OTHER): Payer: Medicaid Other

## 2023-03-12 DIAGNOSIS — J449 Chronic obstructive pulmonary disease, unspecified: Secondary | ICD-10-CM | POA: Insufficient documentation

## 2023-03-12 DIAGNOSIS — E119 Type 2 diabetes mellitus without complications: Secondary | ICD-10-CM | POA: Diagnosis not present

## 2023-03-12 DIAGNOSIS — D251 Intramural leiomyoma of uterus: Secondary | ICD-10-CM | POA: Diagnosis not present

## 2023-03-12 DIAGNOSIS — J45909 Unspecified asthma, uncomplicated: Secondary | ICD-10-CM | POA: Insufficient documentation

## 2023-03-12 DIAGNOSIS — F17210 Nicotine dependence, cigarettes, uncomplicated: Secondary | ICD-10-CM | POA: Diagnosis not present

## 2023-03-12 DIAGNOSIS — N898 Other specified noninflammatory disorders of vagina: Secondary | ICD-10-CM

## 2023-03-12 DIAGNOSIS — Z21 Asymptomatic human immunodeficiency virus [HIV] infection status: Secondary | ICD-10-CM | POA: Diagnosis not present

## 2023-03-12 DIAGNOSIS — K760 Fatty (change of) liver, not elsewhere classified: Secondary | ICD-10-CM | POA: Diagnosis not present

## 2023-03-12 DIAGNOSIS — R109 Unspecified abdominal pain: Secondary | ICD-10-CM | POA: Diagnosis not present

## 2023-03-12 DIAGNOSIS — Z794 Long term (current) use of insulin: Secondary | ICD-10-CM | POA: Diagnosis not present

## 2023-03-12 DIAGNOSIS — R102 Pelvic and perineal pain: Secondary | ICD-10-CM | POA: Diagnosis present

## 2023-03-12 LAB — URINALYSIS, MICROSCOPIC (REFLEX)

## 2023-03-12 LAB — NA AND K (SODIUM & POTASSIUM), RAND UR
Potassium Urine: 37 mmol/L
Sodium, Ur: 63 mmol/L

## 2023-03-12 LAB — CBC
HCT: 37.7 % (ref 36.0–46.0)
Hemoglobin: 13.5 g/dL (ref 12.0–15.0)
MCH: 33.5 pg (ref 26.0–34.0)
MCHC: 35.8 g/dL (ref 30.0–36.0)
MCV: 93.5 fL (ref 80.0–100.0)
Platelets: 286 10*3/uL (ref 150–400)
RBC: 4.03 MIL/uL (ref 3.87–5.11)
RDW: 11.3 % — ABNORMAL LOW (ref 11.5–15.5)
WBC: 9.8 10*3/uL (ref 4.0–10.5)
nRBC: 0 % (ref 0.0–0.2)

## 2023-03-12 LAB — URINALYSIS, ROUTINE W REFLEX MICROSCOPIC
Bilirubin Urine: NEGATIVE
Glucose, UA: >=500 mg/dL — AB
Ketones, ur: NEGATIVE mg/dL
Leukocytes,Ua: NEGATIVE
Nitrite: NEGATIVE
Protein, ur: >=300 mg/dL — AB
Specific Gravity, Urine: 1.025 (ref 1.005–1.030)
pH: 6 (ref 5.0–8.0)

## 2023-03-12 LAB — COMPREHENSIVE METABOLIC PANEL
ALT: 21 U/L (ref 0–44)
AST: 16 U/L (ref 15–41)
Albumin: 2.7 g/dL — ABNORMAL LOW (ref 3.5–5.0)
Alkaline Phosphatase: 63 U/L (ref 38–126)
Anion gap: 10 (ref 5–15)
BUN: 33 mg/dL — ABNORMAL HIGH (ref 6–20)
CO2: 22 mmol/L (ref 22–32)
Calcium: 8.3 mg/dL — ABNORMAL LOW (ref 8.9–10.3)
Chloride: 93 mmol/L — ABNORMAL LOW (ref 98–111)
Creatinine, Ser: 1.78 mg/dL — ABNORMAL HIGH (ref 0.44–1.00)
GFR, Estimated: 37 mL/min — ABNORMAL LOW (ref >60)
Glucose, Bld: 476 mg/dL — ABNORMAL HIGH (ref 70–99)
Potassium: 4.3 mmol/L (ref 3.5–5.1)
Sodium: 125 mmol/L — ABNORMAL LOW (ref 135–145)
Total Bilirubin: 0.5 mg/dL (ref 0.3–1.2)
Total Protein: 6.3 g/dL — ABNORMAL LOW (ref 6.5–8.1)

## 2023-03-12 LAB — OSMOLALITY: Osmolality: 307 mOsm/kg — ABNORMAL HIGH (ref 275–295)

## 2023-03-12 LAB — LIPASE, BLOOD: Lipase: 45 U/L (ref 11–51)

## 2023-03-12 LAB — PREGNANCY, URINE: Preg Test, Ur: NEGATIVE

## 2023-03-12 MED ORDER — IOHEXOL 300 MG/ML  SOLN
80.0000 mL | Freq: Once | INTRAMUSCULAR | Status: AC | PRN
  Administered 2023-03-12: 80 mL via INTRAVENOUS

## 2023-03-12 MED ORDER — ACETAMINOPHEN 500 MG PO TABS
1000.0000 mg | ORAL_TABLET | Freq: Once | ORAL | Status: AC
  Administered 2023-03-12: 1000 mg via ORAL
  Filled 2023-03-12: qty 2

## 2023-03-12 NOTE — ED Triage Notes (Addendum)
Pt reports acute pelvic pain since 10am. Pt reports using 10 pads by 10am for clear liquid that smells metallic coming from her vagina. Pt reports that "my insides hurt" and mainly the lower abdomen/pelvis area. Denies new sexual contacts and not concerned for STI, but not opposed to be tested. Pt reports some abd pain as well.

## 2023-03-13 ENCOUNTER — Encounter (HOSPITAL_BASED_OUTPATIENT_CLINIC_OR_DEPARTMENT_OTHER): Payer: Self-pay | Admitting: Pediatrics

## 2023-03-13 ENCOUNTER — Emergency Department (HOSPITAL_BASED_OUTPATIENT_CLINIC_OR_DEPARTMENT_OTHER)
Admission: EM | Admit: 2023-03-13 | Discharge: 2023-03-13 | Disposition: A | Discharge status: Home / Self Care | Payer: Medicaid Other | Source: Home / Self Care | Attending: Emergency Medicine | Admitting: Emergency Medicine

## 2023-03-13 ENCOUNTER — Emergency Department (HOSPITAL_BASED_OUTPATIENT_CLINIC_OR_DEPARTMENT_OTHER): Payer: Medicaid Other

## 2023-03-13 ENCOUNTER — Other Ambulatory Visit: Payer: Self-pay

## 2023-03-13 DIAGNOSIS — Z794 Long term (current) use of insulin: Secondary | ICD-10-CM | POA: Insufficient documentation

## 2023-03-13 DIAGNOSIS — E119 Type 2 diabetes mellitus without complications: Secondary | ICD-10-CM | POA: Insufficient documentation

## 2023-03-13 DIAGNOSIS — D251 Intramural leiomyoma of uterus: Secondary | ICD-10-CM | POA: Diagnosis not present

## 2023-03-13 DIAGNOSIS — Z21 Asymptomatic human immunodeficiency virus [HIV] infection status: Secondary | ICD-10-CM | POA: Insufficient documentation

## 2023-03-13 DIAGNOSIS — N898 Other specified noninflammatory disorders of vagina: Secondary | ICD-10-CM | POA: Insufficient documentation

## 2023-03-13 LAB — WET PREP, GENITAL
Clue Cells Wet Prep HPF POC: NONE SEEN
Sperm: NONE SEEN
Trich, Wet Prep: NONE SEEN
WBC, Wet Prep HPF POC: >=10 — AB (ref <10)

## 2023-03-13 LAB — OSMOLALITY, URINE: Osmolality, Ur: 486 mOsm/kg (ref 300–900)

## 2023-03-13 MED ORDER — FLUCONAZOLE 150 MG PO TABS
150.0000 mg | ORAL_TABLET | Freq: Once | ORAL | Status: AC
  Administered 2023-03-13: 150 mg via ORAL
  Filled 2023-03-13: qty 1

## 2023-03-13 NOTE — ED Triage Notes (Signed)
C/O persistent symptoms of vaginal contraction and some amniotic fluid like leakage along with abdominal pain.

## 2023-03-13 NOTE — ED Notes (Signed)
Reviewed AVS with patient, patient expressed understanding of directions, denies further questions at this time. 

## 2023-03-13 NOTE — Discharge Instructions (Signed)
You were evaluated in the Emergency Department and after careful evaluation, we did not find any emergent condition requiring admission or further testing in the hospital.  Your exam/testing today is overall reassuring.  Your discharge may be due to a yeast infection.  We treated you here in the emergency department with a single dose of fluconazole.  Recommend follow-up with your regular doctors.  Please return to the Emergency Department if you experience any worsening of your condition.   Thank you for allowing [place] to be a part of your care.

## 2023-03-13 NOTE — Discharge Instructions (Signed)
It was a pleasure taking care of you today!  Your ultrasound showed concerns for fibroids to your uterus as well as a thickened endometrial lining.  It is important that you maintain your follow-up appointment with your OB/GYN as scheduled on 03/15/2023.  At home you may take over-the-counter 500 mg Tylenol every 6 hours and alternate with 600 mg ibuprofen every 6 hours for no more than 7 days.  Ensure to maintain fluid intake.  Return to the emergency department if you experience increasing/worsening symptoms.

## 2023-03-13 NOTE — ED Provider Notes (Signed)
[facility]Y DEPT [facility] Emergency Department Provider Note MRN:  [id number]  Arrival date & time: 03/13/23     Chief Complaint   Pelvic Pain   History of Present Illness   Renee Bryant is a 42 y.o. year-old female with a history of diabetes, COPD, HIV presenting to the ED with chief complaint of pelvic pain.  Lower abdominal cramping pain as well as vaginal discharge.  Describes it as a clear watery discharge, copious throughout the day today.  Is never happened before.  No fever.  Review of Systems  A thorough review of systems was obtained and all systems are negative except as noted in the HPI and PMH.   Patient's Health History    Past Medical History:  Diagnosis Date   Abscess    Asthma    COPD (chronic obstructive pulmonary disease)    Diabetes mellitus without complication    Emphysema of lung    HIV (human immunodeficiency virus infection)    Pneumonia     Past Surgical History:  Procedure Laterality Date   CESAREAN SECTION     CHOLECYSTECTOMY     FOOT SURGERY Left    TUBAL LIGATION      History reviewed. No pertinent family history.  Social History   Socioeconomic History   Marital status: Married    Spouse name: Not on file   Number of children: Not on file   Years of education: Not on file   Highest education level: Not on file  Occupational History   Not on file  Tobacco Use   Smoking status: Every Day    Packs/day: 1    Types: Cigarettes   Smokeless tobacco: Never  Vaping Use   Vaping Use: Never used  Substance and Sexual Activity   Alcohol use: No   Drug use: No   Sexual activity: Not on file  Other Topics Concern   Not on file  Social History Narrative   Not on file   Social Determinants of Health   Financial Resource Strain: Not on file  Food Insecurity: Not on file  Transportation Needs: Not on file  Physical Activity: Not on file  Stress: Not on file  Social Connections: Not on file  Intimate Partner Violence:  Not on file     Physical Exam   Vitals:   03/12/23 2004 03/13/23 0036  BP:    Pulse:    Resp: 18   Temp:  98.7 F (37.1 C)  SpO2:      CONSTITUTIONAL: Well-appearing, NAD NEURO/PSYCH:  Alert and oriented x 3, no focal deficits EYES:  eyes equal and reactive ENT/NECK:  no LAD, no JVD CARDIO: Regular rate, well-perfused, normal S1 and S2 PULM:  CTAB no wheezing or rhonchi GI/GU:  non-distended, non-tender MSK/SPINE:  No gross deformities, no edema SKIN:  no rash, atraumatic   *Additional and/or pertinent findings included in MDM below  Diagnostic and Interventional Summary    EKG Interpretation  Date/Time:    Ventricular Rate:    PR Interval:    QRS Duration:   QT Interval:    QTC Calculation:   R Axis:     Text Interpretation:         Labs Reviewed  WET PREP, GENITAL - Abnormal; Notable for the following components:      Result Value   Yeast Wet Prep HPF POC PRESENT (*)    WBC, Wet Prep HPF POC >=10 (*)    All other components within normal limits  URINALYSIS, ROUTINE W REFLEX MICROSCOPIC - Abnormal; Notable for the following components:   Glucose, UA >=500 (*)    Hgb urine dipstick MODERATE (*)    Protein, ur >=300 (*)    All other components within normal limits  COMPREHENSIVE METABOLIC PANEL - Abnormal; Notable for the following components:   Sodium 125 (*)    Chloride 93 (*)    Glucose, Bld 476 (*)    BUN 33 (*)    Creatinine, Ser 1.78 (*)    Calcium 8.3 (*)    Total Protein 6.3 (*)    Albumin 2.7 (*)    GFR, Estimated 37 (*)    All other components within normal limits  CBC - Abnormal; Notable for the following components:   RDW 11.3 (*)    All other components within normal limits  URINALYSIS, MICROSCOPIC (REFLEX) - Abnormal; Notable for the following components:   Bacteria, UA RARE (*)    All other components within normal limits  OSMOLALITY - Abnormal; Notable for the following components:   Osmolality 307 (*)    All other components  within normal limits  PREGNANCY, URINE  LIPASE, BLOOD  NA AND K (SODIUM & POTASSIUM), RAND UR  OSMOLALITY, URINE  GC/CHLAMYDIA PROBE AMP ([place]) NOT AT [facility]    CT ABDOMEN PELVIS W CONTRAST  Final Result      Medications  fluconazole (DIFLUCAN) tablet 100 mg (has no administration in time range)  acetaminophen (TYLENOL) tablet 1,000 mg (1,000 mg Oral Given 03/12/23 2010)  iohexol (OMNIPAQUE) 300 MG/ML solution 80 mL (80 mLs Intravenous Contrast Given 03/12/23 2329)     Procedures  /  Critical Care Procedures  ED Course and Medical Decision Making  Initial Impression and Ddx Well-appearing, no acute distress, reassuring vital signs.  Abdomen overall soft and nontender.  Pelvic exam reveals normal-appearing external genitalia, no significant adnexal tenderness, no palpable masses, small amount of blood in the vaginal vault but no obvious signs of abnormal discharge.  Doubt PID, pain could be related to fibroids.  Past medical/surgical history that increases complexity of ED encounter: HIV  Interpretation of Diagnostics I personally reviewed the laboratory assessment and my interpretation is as follows: Pseudohyponatremia, mild elevation in creatinine, has known CKD stage III  CT scan is without emergent process.  Patient Reassessment and Ultimate Disposition/Management     No signs of emergent medical issue at this time, wet prep is positive for yeast, will treat.  Patient has follow-up with primary care in a few days.  Patient management required discussion with the following services or consulting groups:  None  Complexity of Problems Addressed Acute illness or injury that poses threat of life of bodily function  Additional Data Reviewed and Analyzed Further history obtained from: None  Additional Factors Impacting ED Encounter Risk None  Elmer Sow. Pilar Plate, MD [facility] Emergency [facility] [email or web address]  Final Clinical  Impressions(s) / ED Diagnoses     ICD-10-CM   1. Vaginal discharge  N89.8       ED Discharge Orders     None        Discharge Instructions Discussed with and Provided to Patient:    Discharge Instructions      You were evaluated in the Emergency Department and after careful evaluation, we did not find any emergent condition requiring admission or further testing in the hospital.  Your exam/testing today is overall reassuring.  Your discharge may be due to a yeast infection.  We treated  you here in the emergency department with a single dose of fluconazole.  Recommend follow-up with your regular doctors.  Please return to the Emergency Department if you experience any worsening of your condition.   Thank you for allowing [place] to be a part of your care.      Sabas Sous, MD 03/13/23 [phone]

## 2023-03-13 NOTE — ED Notes (Signed)
Patient verbalizes understanding of discharge instructions. Opportunity for questioning and answers were provided. 

## 2023-03-13 NOTE — ED Provider Notes (Signed)
[facility] Provider Note   CSN: [id number] Arrival date & time: 03/13/23  1817     History  Chief Complaint  Patient presents with   Abdominal Pain    Kelsey Durflinger is a 42 y.o. female with a PMHx of HIV, DM, who presents to the ED with concerns for abdominal pain. Was seen in the ED for similar symptoms last night.  Notes that it feels like she is having vaginal contractions and feels as if she is having amniotic fluid leaking out.  Noted she is going through 20 pads.  Her last menstrual cycle was approximately 3 weeks ago.  She notes her discharge is watery/pale pink.  Denies concerns at this time for STIs.  Notes that her HIV has been undetectable.  Denies vomiting, fever.   The history is provided by the patient. No language interpreter was used.       Home Medications Prior to Admission medications   Medication Sig Start Date End Date Taking? Authorizing Provider  albuterol (PROVENTIL HFA;VENTOLIN HFA) 108 (90 Base) MCG/ACT inhaler Inhale 2 puffs into the lungs every 4 (four) hours as needed for wheezing or shortness of breath. 02/03/19   Maxwell Caul, PA-C  albuterol (VENTOLIN HFA) 108 (90 Base) MCG/ACT inhaler Inhale 1-2 puffs into the lungs every 6 (six) hours as needed for wheezing or shortness of breath. 11/11/22   Palumbo, April, MD  atorvastatin (LIPITOR) 40 MG tablet Take by mouth. 09/24/20   [provider]  bictegravir-emtricitabine-tenofovir AF (BIKTARVY) 50-200-25 MG TABS tablet Take by mouth daily.    [provider]  cephALEXin (KEFLEX) 500 MG capsule Take 1 capsule (500 mg total) by mouth 4 (four) times daily. 05/05/19   Palumbo, April, MD  clonazePAM (KLONOPIN) 0.5 MG tablet Take 1 mg by mouth 2 (two) times daily.     [provider]  Dulaglutide (TRULICITY) 3 MG/0.5ML SOPN INJECT 1 PEN UNDER THE SKIN ONCE A WEEK 09/26/20   [provider]  Elviteg-Cobic-Emtricit-TenofAF (GENVOYA  PO) Take by mouth.    [provider]  ergocalciferol (VITAMIN D2) 1.25 MG (50000 UT) capsule Take 1 capsule twice a week. 10/09/19   [provider]  GABAPENTIN PO Take by mouth.    [provider]  glipiZIDE (GLUCOTROL) 10 MG tablet Take by mouth. 10/06/15   [provider]  ibuprofen (ADVIL,MOTRIN) 800 MG tablet Take 1 tablet (800 mg total) by mouth every 8 (eight) hours as needed. 11/17/17   Long, Arlyss Repress, MD  insulin glargine (LANTUS) 100 UNIT/ML injection Inject 18 Units into the skin at bedtime.    [provider]  lidocaine (LIDODERM) 5 % Place 1 patch onto the skin daily. Remove & Discard patch within 12 hours or as directed by MD 12/12/22   Rondel Baton, MD  losartan (COZAAR) 50 MG tablet Take 1 tablet (50 mg total) by mouth daily. 07/21/22 08/20/22  Claude Manges, PA-C  methocarbamol (ROBAXIN) 500 MG tablet Take 1 tablet (500 mg total) by mouth every 8 (eight) hours as needed. 04/25/19   Petrucelli, Samantha R, PA-C  metroNIDAZOLE (FLAGYL) 500 MG tablet Take 1 tablet (500 mg total) by mouth 2 (two) times daily. 04/30/22   Prosperi, Christian H, PA-C  naproxen (NAPROSYN) 500 MG tablet Take 1 tablet (500 mg total) by mouth 2 (two) times daily. 12/05/16   Renne Crigler, PA-C  omeprazole (PRILOSEC) 20 MG capsule Take 20 mg by mouth daily.    [provider]  oxyCODONE (ROXICODONE) 5 MG immediate release tablet Take 1 tablet (5 mg total) by mouth every 6 (six) hours as needed for severe pain. 05/01/22   Dione Booze, MD  phenazopyridine (PYRIDIUM) 200 MG tablet Take 1 tablet (200 mg total) by mouth 3 (three) times daily. 05/05/19   Palumbo, April, MD  Rimegepant Sulfate (NURTEC) 75 MG TBDP DISSOLVE 1 TABLET ON THE TONGUE DAILY AS NEEDED 09/23/20   [provider]  valACYclovir (VALTREX) 500 MG tablet Take 500 mg by mouth 2 (two) times daily as needed.    [provider]      Allergies    Metformin and related, Wellbutrin  [bupropion], and Trulicity [dulaglutide]    Review of Systems   Review of Systems  Gastrointestinal:  Positive for abdominal pain.  All other systems reviewed and are negative.   Physical Exam Updated Vital Signs BP (!) 143/94 (BP Location: Left Arm)   Pulse (!) 115   Temp 98 F (36.7 C)   Resp 20   Ht  (1.702 m)   Wt 88.9 kg   LMP 02/27/2023 (Approximate)   SpO2 99%   BMI 30.70 kg/m  Physical Exam Vitals and nursing note reviewed. Exam conducted with a chaperone present.  Constitutional:      General: She is not in acute distress.    Appearance: Normal appearance. She is not ill-appearing, toxic-appearing or diaphoretic.  HENT:     Head: Normocephalic and atraumatic.     Right Ear: External ear normal.     Left Ear: External ear normal.     Mouth/Throat:     Pharynx: No oropharyngeal exudate.  Eyes:     General: No scleral icterus.    Extraocular Movements: Extraocular movements intact.     Conjunctiva/sclera: Conjunctivae normal.  Cardiovascular:     Rate and Rhythm: Normal rate and regular rhythm.     Pulses: Normal pulses.     Heart sounds: Normal heart sounds.  Pulmonary:     Effort: Pulmonary effort is normal. No respiratory distress.     Breath sounds: Normal breath sounds. No wheezing.  Abdominal:     General: Abdomen is flat. Bowel sounds are normal. There is no distension.     Palpations: Abdomen is soft. There is no mass.     Tenderness: There is no abdominal tenderness. There is no guarding or rebound.     Hernia: There is no hernia in the left inguinal area or right inguinal area.  Genitourinary:    Pubic Area: No rash.      Labia:        Right: No rash, tenderness, lesion or injury.        Left: No rash, tenderness, lesion or injury.      Vagina: No signs of injury and foreign body. Vaginal discharge and bleeding present. No erythema or tenderness.     Cervix: Normal.     Uterus: Normal. Not deviated, not enlarged, not fixed and not tender.       Adnexa: Right adnexa normal and left adnexa normal.     Comments: RN chaperone Valetta Close) present for exam. Clear watery discharge and bleeding noted on exam from cervix.  Musculoskeletal:        General: Normal range of motion.     Cervical back: Normal range of motion and neck supple.  Lymphadenopathy:     Lower Body: No right inguinal adenopathy. No left inguinal adenopathy.  Skin:    General: Skin  is warm and dry.  Neurological:     Mental Status: She is alert.  Psychiatric:        Behavior: Behavior normal.     ED Results / Procedures / Treatments   Labs (all labs ordered are listed, but only abnormal results are displayed) Labs Reviewed - No data to display  EKG None  Radiology US PELVIC COMPLETE WITH TRANSVAGINAL  Result Date: 03/13/2023 CLINICAL DATA:  19147 Vaginal discharge 82956 Diffuse pelvic bloating, clear vaginal discharge x 24 hours. BLeeding started again today in ER. EXAM: TRANSABDOMINAL AND TRANSVAGINAL ULTRASOUND OF PELVIS TECHNIQUE: Both transabdominal and transvaginal ultrasound examinations of the pelvis were performed. Transabdominal technique was performed for global imaging of the pelvis including uterus, ovaries, adnexal regions, and pelvic cul-de-sac. It was necessary to proceed with endovaginal exam following the transabdominal exam to visualize the uterus, endometrium, ovaries and adnexa. COMPARISON:  CT abdomen pelvis 03/12/2023, ultrasound pelvis 07/30/2018 FINDINGS: Uterus Measurements: 12.2 x 6.2 x 9.9 cm = volume: 393 mL. Total of 2 intramural uterine lesions measuring up to 6.2 x 5.5 x 5.4 cm. Endometrium Thickness: 18 mm.  No focal abnormality visualized. Right ovary Measurements: 3.3 x 1.6 x 3 cm = volume: 8.2 mL. Normal appearance/no adnexal mass. Left ovary Not visualized. Other findings No abnormal free fluid. IMPRESSION: 1. Thickened endometrium measuring up to 18 mm. 2. Intramural uterine fibroids measuring up to 6.2 cm. 3. Left ovary not  visualized. Electronically Signed   By: Tish Frederickson M.D.   On: 03/13/2023 21:11   CT ABDOMEN PELVIS W CONTRAST  Result Date: 03/12/2023 CLINICAL DATA:  Acute pelvic pain.  Prior cholecystectomy and BTL. EXAM: CT ABDOMEN AND PELVIS WITH CONTRAST TECHNIQUE: Multidetector CT imaging of the abdomen and pelvis was performed using the standard protocol following bolus administration of intravenous contrast. RADIATION DOSE REDUCTION: This exam was performed according to the departmental dose-optimization program which includes automated exposure control, adjustment of the mA and/or kV according to patient size and/or use of iterative reconstruction technique. CONTRAST:  80mL OMNIPAQUE IOHEXOL 300 MG/ML  SOLN COMPARISON:  CT without contrast 12/12/2022, CT with contrast 04/30/2022 FINDINGS: Lower chest: No acute abnormality. Hepatobiliary: The liver is 20 cm in length with mild features of steatosis. There is no mass enhancement. The gallbladder again noted surgically absent without biliary dilatation. Mild prominence of the hepatic portal main vein, measures 15 mm, unchanged. Pancreas: No abnormality. Spleen: No abnormality.  No splenomegaly. Adrenals/Urinary Tract: Adrenal glands are unremarkable. Kidneys are normal, without renal calculi, focal lesion, or hydronephrosis. Bladder is unremarkable. Stomach/Bowel: The gastric wall is normal in thickness. The duodenum unremarkable. There is dilatation of the proximal jejunum up to 3.1 cm with fold thickening but no visible transition. This could be due to enteritis with ileus or low-grade proximal jejunal obstruction due to enteritis as no discrete transition is found. Rest of the small bowel is normal caliber. The appendix is normal. There is mild-to-moderate stool retention. There are colonic diverticula without evidence of diverticulitis or focal colitis. Vascular/Lymphatic: No significant vascular findings are present. No enlarged abdominal or pelvic lymph nodes.  Reproductive: Enlarged myomatous uterus, largest fibroid is along the ventral body/fundal area, transmural and measuring 6.5 cm. No adnexal masses seen. Other: There is no free air, free fluid, free hemorrhage or acute inflammatory changes. No abscess is seen. There is no incarcerated hernia. Musculoskeletal: Mild degenerative change thoracic and lumbar spine. No acute or significant osseous findings. IMPRESSION: 1. No etiology for acute pelvic pain is identified.  2. Dilated proximal jejunum with fold thickening but no visible transition. Findings could be due to enteritis with ileus or low-grade proximal jejunal obstruction due to enteritis. 3. Constipation and diverticulosis. 4. Enlarged myomatous uterus. 5. Mild hepatomegaly and hepatic steatosis. 6. Mild prominence of the hepatic portal vein, unchanged. Electronically Signed   By: Almira Bar M.D.   On: 03/12/2023 23:53    Procedures Procedures    Medications Ordered in ED Medications - No data to display  ED Course/ Medical Decision Making/ A&P Clinical Course as of 03/13/23 2342  Wed Mar 13, 2023  2119 Discussed with patient ultrasound findings.  Discussed with patient discharge treatment plan.  Answered all available questions.  Patient appears safe for discharge at this time. [SB]    Clinical Course User Index [SB] Crystal Scarberry A, PA-C                             Medical Decision Making Amount and/or Complexity of Data Reviewed Radiology: ordered.   Patient presents to the emergency department with concerns for abdominal pain and watery vaginal discharge.  She has a follow-up appointment with her OB/GYN on 03/15/2023. On exam patient with RN chaperone Valetta Close) present for exam. Clear watery discharge and bleeding noted on exam from cervix.  Remainder of exam without acute findings.  Pt afebrile. Differential diagnosis includes ovarian cyst, STI, physiologic vaginal discharge, menstrual cycle.  Additional history obtained:   External records from outside source obtained and reviewed including: Pt was evaluated in the ED last night   Labs:  I ordered, and personally interpreted labs.  The pertinent results include:    Imaging: I ordered imaging studies including pelvic US I independently visualized and interpreted imaging which showed  1. Thickened endometrium measuring up to 18 mm.  2. Intramural uterine fibroids measuring up to 6.2 cm.  3. Left ovary not visualized.   I agree with the radiologist interpretation   Disposition: Presenting suspicious for physiologic discharge as well as starting a possible menstrual cycle.. After consideration of the diagnostic results and the patients response to treatment, I feel that the patient would benefit from Discharge home.  Patient recently had STI testing on yesterday.  Notes that she has been compliant with her antivirals for her HIV.  Patient has a follow-up appoint with OB/GYN on 03/15/2023, instructed patient to follow-up with that visit for further evaluation of her symptoms.  Supportive care measures and strict return precautions discussed with patient at bedside. Pt acknowledges and verbalizes understanding. Pt appears safe for discharge. Follow up as indicated in discharge paperwork.   This chart was dictated using voice recognition software, Dragon. Despite the best efforts of this provider to proofread and correct errors, errors may still occur which can change documentation meaning.  Final Clinical Impression(s) / ED Diagnoses Final diagnoses:  Vaginal discharge    Rx / DC Orders ED Discharge Orders     None         Mariapaula Krist A, PA-C 03/13/23 2342    Glyn Ade, MD 03/15/23 1459

## 2023-03-14 LAB — GC/CHLAMYDIA PROBE AMP (~~LOC~~) NOT AT ARMC
Chlamydia: NEGATIVE
Comment: NEGATIVE
Comment: NORMAL
Neisseria Gonorrhea: NEGATIVE

## 2023-03-15 DIAGNOSIS — N858 Other specified noninflammatory disorders of uterus: Secondary | ICD-10-CM | POA: Diagnosis not present

## 2023-03-15 DIAGNOSIS — N938 Other specified abnormal uterine and vaginal bleeding: Secondary | ICD-10-CM | POA: Diagnosis not present

## 2023-03-15 DIAGNOSIS — D251 Intramural leiomyoma of uterus: Secondary | ICD-10-CM | POA: Diagnosis not present

## 2023-03-19 DIAGNOSIS — F17210 Nicotine dependence, cigarettes, uncomplicated: Secondary | ICD-10-CM | POA: Diagnosis not present

## 2023-03-19 DIAGNOSIS — N059 Unspecified nephritic syndrome with unspecified morphologic changes: Secondary | ICD-10-CM | POA: Diagnosis not present

## 2023-03-19 DIAGNOSIS — Z7951 Long term (current) use of inhaled steroids: Secondary | ICD-10-CM | POA: Diagnosis not present

## 2023-03-19 DIAGNOSIS — Z793 Long term (current) use of hormonal contraceptives: Secondary | ICD-10-CM | POA: Diagnosis not present

## 2023-03-19 DIAGNOSIS — Z79899 Other long term (current) drug therapy: Secondary | ICD-10-CM | POA: Diagnosis not present

## 2023-03-19 DIAGNOSIS — N179 Acute kidney failure, unspecified: Secondary | ICD-10-CM | POA: Diagnosis not present

## 2023-03-19 DIAGNOSIS — N921 Excessive and frequent menstruation with irregular cycle: Secondary | ICD-10-CM | POA: Diagnosis not present

## 2023-03-19 DIAGNOSIS — N939 Abnormal uterine and vaginal bleeding, unspecified: Secondary | ICD-10-CM | POA: Diagnosis not present

## 2023-03-19 DIAGNOSIS — J45909 Unspecified asthma, uncomplicated: Secondary | ICD-10-CM | POA: Diagnosis not present

## 2023-03-19 DIAGNOSIS — K219 Gastro-esophageal reflux disease without esophagitis: Secondary | ICD-10-CM | POA: Diagnosis not present

## 2023-03-19 DIAGNOSIS — I10 Essential (primary) hypertension: Secondary | ICD-10-CM | POA: Diagnosis not present

## 2023-03-19 DIAGNOSIS — Z79624 Long term (current) use of inhibitors of nucleotide synthesis: Secondary | ICD-10-CM | POA: Diagnosis not present

## 2023-03-19 DIAGNOSIS — E119 Type 2 diabetes mellitus without complications: Secondary | ICD-10-CM | POA: Diagnosis not present

## 2023-03-19 DIAGNOSIS — N920 Excessive and frequent menstruation with regular cycle: Secondary | ICD-10-CM | POA: Diagnosis not present

## 2023-03-19 DIAGNOSIS — Z98891 History of uterine scar from previous surgery: Secondary | ICD-10-CM | POA: Diagnosis not present

## 2023-03-26 DIAGNOSIS — N920 Excessive and frequent menstruation with regular cycle: Secondary | ICD-10-CM | POA: Diagnosis not present

## 2023-03-26 DIAGNOSIS — D251 Intramural leiomyoma of uterus: Secondary | ICD-10-CM | POA: Diagnosis not present

## 2023-03-27 DIAGNOSIS — Z419 Encounter for procedure for purposes other than remedying health state, unspecified: Secondary | ICD-10-CM | POA: Diagnosis not present

## 2023-04-26 DIAGNOSIS — F419 Anxiety disorder, unspecified: Secondary | ICD-10-CM | POA: Diagnosis not present

## 2023-04-26 DIAGNOSIS — E1165 Type 2 diabetes mellitus with hyperglycemia: Secondary | ICD-10-CM | POA: Diagnosis not present

## 2023-04-26 DIAGNOSIS — J449 Chronic obstructive pulmonary disease, unspecified: Secondary | ICD-10-CM | POA: Diagnosis not present

## 2023-04-26 DIAGNOSIS — F32A Depression, unspecified: Secondary | ICD-10-CM | POA: Diagnosis not present

## 2023-04-26 DIAGNOSIS — B20 Human immunodeficiency virus [HIV] disease: Secondary | ICD-10-CM | POA: Diagnosis not present

## 2023-04-26 DIAGNOSIS — Z79899 Other long term (current) drug therapy: Secondary | ICD-10-CM | POA: Diagnosis not present

## 2023-04-27 DIAGNOSIS — Z419 Encounter for procedure for purposes other than remedying health state, unspecified: Secondary | ICD-10-CM | POA: Diagnosis not present

## 2023-05-01 ENCOUNTER — Other Ambulatory Visit: Payer: Self-pay

## 2023-05-01 ENCOUNTER — Inpatient Hospital Stay (HOSPITAL_BASED_OUTPATIENT_CLINIC_OR_DEPARTMENT_OTHER)
Admission: EM | Admit: 2023-05-01 | Discharge: 2023-05-04 | DRG: 281 | Disposition: A | Discharge status: Home / Self Care | Payer: Medicaid Other | Attending: Internal Medicine | Admitting: Internal Medicine

## 2023-05-01 ENCOUNTER — Emergency Department (HOSPITAL_BASED_OUTPATIENT_CLINIC_OR_DEPARTMENT_OTHER): Payer: Medicaid Other

## 2023-05-01 ENCOUNTER — Encounter (HOSPITAL_BASED_OUTPATIENT_CLINIC_OR_DEPARTMENT_OTHER): Payer: Self-pay | Admitting: Emergency Medicine

## 2023-05-01 DIAGNOSIS — E1165 Type 2 diabetes mellitus with hyperglycemia: Secondary | ICD-10-CM | POA: Diagnosis not present

## 2023-05-01 DIAGNOSIS — E1122 Type 2 diabetes mellitus with diabetic chronic kidney disease: Secondary | ICD-10-CM | POA: Diagnosis present

## 2023-05-01 DIAGNOSIS — N1831 Chronic kidney disease, stage 3a: Secondary | ICD-10-CM | POA: Diagnosis present

## 2023-05-01 DIAGNOSIS — Z6831 Body mass index (BMI) 31.0-31.9, adult: Secondary | ICD-10-CM | POA: Diagnosis not present

## 2023-05-01 DIAGNOSIS — E11649 Type 2 diabetes mellitus with hypoglycemia without coma: Secondary | ICD-10-CM | POA: Diagnosis not present

## 2023-05-01 DIAGNOSIS — K219 Gastro-esophageal reflux disease without esophagitis: Secondary | ICD-10-CM | POA: Diagnosis present

## 2023-05-01 DIAGNOSIS — Z21 Asymptomatic human immunodeficiency virus [HIV] infection status: Secondary | ICD-10-CM | POA: Diagnosis present

## 2023-05-01 DIAGNOSIS — Z888 Allergy status to other drugs, medicaments and biological substances status: Secondary | ICD-10-CM

## 2023-05-01 DIAGNOSIS — E871 Hypo-osmolality and hyponatremia: Secondary | ICD-10-CM | POA: Diagnosis not present

## 2023-05-01 DIAGNOSIS — Z7689 Persons encountering health services in other specified circumstances: Secondary | ICD-10-CM | POA: Diagnosis not present

## 2023-05-01 DIAGNOSIS — D631 Anemia in chronic kidney disease: Secondary | ICD-10-CM | POA: Diagnosis present

## 2023-05-01 DIAGNOSIS — Z79899 Other long term (current) drug therapy: Secondary | ICD-10-CM

## 2023-05-01 DIAGNOSIS — J439 Emphysema, unspecified: Secondary | ICD-10-CM | POA: Diagnosis not present

## 2023-05-01 DIAGNOSIS — J4489 Other specified chronic obstructive pulmonary disease: Secondary | ICD-10-CM | POA: Diagnosis present

## 2023-05-01 DIAGNOSIS — Z7984 Long term (current) use of oral hypoglycemic drugs: Secondary | ICD-10-CM

## 2023-05-01 DIAGNOSIS — R079 Chest pain, unspecified: Secondary | ICD-10-CM | POA: Diagnosis not present

## 2023-05-01 DIAGNOSIS — Z794 Long term (current) use of insulin: Secondary | ICD-10-CM | POA: Diagnosis not present

## 2023-05-01 DIAGNOSIS — B20 Human immunodeficiency virus [HIV] disease: Secondary | ICD-10-CM | POA: Insufficient documentation

## 2023-05-01 DIAGNOSIS — I10 Essential (primary) hypertension: Secondary | ICD-10-CM | POA: Insufficient documentation

## 2023-05-01 DIAGNOSIS — Z7985 Long-term (current) use of injectable non-insulin antidiabetic drugs: Secondary | ICD-10-CM

## 2023-05-01 DIAGNOSIS — R0789 Other chest pain: Secondary | ICD-10-CM | POA: Diagnosis not present

## 2023-05-01 DIAGNOSIS — D259 Leiomyoma of uterus, unspecified: Secondary | ICD-10-CM | POA: Diagnosis present

## 2023-05-01 DIAGNOSIS — I129 Hypertensive chronic kidney disease with stage 1 through stage 4 chronic kidney disease, or unspecified chronic kidney disease: Secondary | ICD-10-CM | POA: Diagnosis present

## 2023-05-01 DIAGNOSIS — Z7982 Long term (current) use of aspirin: Secondary | ICD-10-CM

## 2023-05-01 DIAGNOSIS — I2510 Atherosclerotic heart disease of native coronary artery without angina pectoris: Secondary | ICD-10-CM | POA: Diagnosis present

## 2023-05-01 DIAGNOSIS — F17210 Nicotine dependence, cigarettes, uncomplicated: Secondary | ICD-10-CM | POA: Diagnosis not present

## 2023-05-01 DIAGNOSIS — E669 Obesity, unspecified: Secondary | ICD-10-CM | POA: Diagnosis present

## 2023-05-01 DIAGNOSIS — I214 Non-ST elevation (NSTEMI) myocardial infarction: Secondary | ICD-10-CM | POA: Diagnosis not present

## 2023-05-01 DIAGNOSIS — E785 Hyperlipidemia, unspecified: Secondary | ICD-10-CM | POA: Diagnosis present

## 2023-05-01 LAB — CBC
HCT: 36 % (ref 36.0–46.0)
Hemoglobin: 12.8 g/dL (ref 12.0–15.0)
MCH: 33.6 pg (ref 26.0–34.0)
MCHC: 35.6 g/dL (ref 30.0–36.0)
MCV: 94.5 fL (ref 80.0–100.0)
Platelets: 247 10*3/uL (ref 150–400)
RBC: 3.81 MIL/uL — ABNORMAL LOW (ref 3.87–5.11)
RDW: 11.8 % (ref 11.5–15.5)
WBC: 11.4 10*3/uL — ABNORMAL HIGH (ref 4.0–10.5)
nRBC: 0 % (ref 0.0–0.2)

## 2023-05-01 LAB — TROPONIN I (HIGH SENSITIVITY): Troponin I (High Sensitivity): 149 ng/L (ref <18)

## 2023-05-01 LAB — BASIC METABOLIC PANEL
Anion gap: 8 (ref 5–15)
BUN: 30 mg/dL — ABNORMAL HIGH (ref 6–20)
CO2: 22 mmol/L (ref 22–32)
Calcium: 8.1 mg/dL — ABNORMAL LOW (ref 8.9–10.3)
Chloride: 98 mmol/L (ref 98–111)
Creatinine, Ser: 1.47 mg/dL — ABNORMAL HIGH (ref 0.44–1.00)
GFR, Estimated: 46 mL/min — ABNORMAL LOW (ref >60)
Glucose, Bld: 413 mg/dL — ABNORMAL HIGH (ref 70–99)
Potassium: 4.2 mmol/L (ref 3.5–5.1)
Sodium: 128 mmol/L — ABNORMAL LOW (ref 135–145)

## 2023-05-01 LAB — CBG MONITORING, ED: Glucose-Capillary: 470 mg/dL — ABNORMAL HIGH (ref 70–99)

## 2023-05-01 MED ORDER — INSULIN ASPART 100 UNIT/ML IJ SOLN
8.0000 [IU] | Freq: Once | INTRAMUSCULAR | Status: AC
  Administered 2023-05-01: 8 [IU] via SUBCUTANEOUS

## 2023-05-01 MED ORDER — INSULIN REGULAR HUMAN 100 UNIT/ML IJ SOLN: 8.0000 [IU] | Freq: Once | INTRAMUSCULAR | Status: DC

## 2023-05-01 MED ORDER — SODIUM CHLORIDE 0.9 % IV BOLUS
1000.0000 mL | Freq: Once | INTRAVENOUS | Status: AC
  Administered 2023-05-01: 1000 mL via INTRAVENOUS

## 2023-05-01 NOTE — ED Triage Notes (Signed)
Pt c/o burning CP w/ radiation to LUE; recent hyperglycemia per pt; sts she feels jittery

## 2023-05-01 NOTE — ED Notes (Signed)
Notified Dr Donnald Garre and Shaun L of critical trop of 149

## 2023-05-01 NOTE — ED Provider Notes (Signed)
I provided a substantive portion of the care of this patient.  I personally made/approved the management plan for this patient and take responsibility for the patient management.    Has complex medical history including insulin-dependent diabetes, COPD and HIV.  She reports that she developed chest pain today and it was pretty severe through a large part of the day and then waxing and waning severity.  Blood sugars elevated significantly up to 500.  Patient is alert.  No respiratory distress at rest.  Regular.  No gross rub or gallop.  Lungs clear to auscultation.  Positive peripheral edema.  Patient troponin is elevated.  EKG does not show STEMI pattern.  She has high risk for coronary artery disease.  She will require admission.  I agree with plan of management.   Arby Barrette, MD 05/03/23 2352

## 2023-05-01 NOTE — ED Provider Notes (Signed)
[facility] Provider Note   CSN: [id number] Arrival date & time: 05/01/23  1956     History  Chief Complaint  Patient presents with   Chest Pain    Lakendra Inzer is a 42 y.o. female.  42 year old female with history of insulin-dependent diabetes, COPD, HIV, asthma, pneumonia emphysema presents with complaint of hyperglycemia and a burning sensation in her chest.  Patient states that she woke up this morning, her blood sugar was 500, she gave herself 50 units of Lantus.  Blood sugar has not improved, reports feeling like her chest is on fire.  Patient called her PCP office who was supposed to have called in her insulin last Friday with a meter and states they have called in the meter but she does not have a prescription for her regular insulin yet.  She denies vomiting, fevers.  States burning in her chest thought to be related to her GERD, took Prevacid which helped briefly and then the burning sensation returned and was intermittent today, last had pain around 9:30pm today. No other complaints or concerns today.       Home Medications Prior to Admission medications   Medication Sig Start Date End Date Taking? Authorizing Provider  albuterol (PROVENTIL HFA;VENTOLIN HFA) 108 (90 Base) MCG/ACT inhaler Inhale 2 puffs into the lungs every 4 (four) hours as needed for wheezing or shortness of breath. 02/03/19   Maxwell Caul, PA-C  albuterol (VENTOLIN HFA) 108 (90 Base) MCG/ACT inhaler Inhale 1-2 puffs into the lungs every 6 (six) hours as needed for wheezing or shortness of breath. 11/11/22   Palumbo, April, MD  atorvastatin (LIPITOR) 40 MG tablet Take by mouth. 09/24/20   [provider]  bictegravir-emtricitabine-tenofovir AF (BIKTARVY) 50-200-25 MG TABS tablet Take by mouth daily.    [provider]  cephALEXin (KEFLEX) 500 MG capsule Take 1 capsule (500 mg total) by mouth 4 (four) times daily. 05/05/19   Palumbo, April, MD   clonazePAM (KLONOPIN) 0.5 MG tablet Take 1 mg by mouth 2 (two) times daily.     [provider]  Dulaglutide (TRULICITY) 3 MG/0.5ML SOPN INJECT 1 PEN UNDER THE SKIN ONCE A WEEK 09/26/20   [provider]  Elviteg-Cobic-Emtricit-TenofAF (GENVOYA PO) Take by mouth.    [provider]  ergocalciferol (VITAMIN D2) 1.25 MG (50000 UT) capsule Take 1 capsule twice a week. 10/09/19   [provider]  GABAPENTIN PO Take by mouth.    [provider]  glipiZIDE (GLUCOTROL) 10 MG tablet Take by mouth. 10/06/15   [provider]  ibuprofen (ADVIL,MOTRIN) 800 MG tablet Take 1 tablet (800 mg total) by mouth every 8 (eight) hours as needed. 11/17/17   Long, Arlyss Repress, MD  insulin glargine (LANTUS) 100 UNIT/ML injection Inject 18 Units into the skin at bedtime.    [provider]  lidocaine (LIDODERM) 5 % Place 1 patch onto the skin daily. Remove & Discard patch within 12 hours or as directed by MD 12/12/22   Rondel Baton, MD  losartan (COZAAR) 50 MG tablet Take 1 tablet (50 mg total) by mouth daily. 07/21/22 08/20/22  Claude Manges, PA-C  methocarbamol (ROBAXIN) 500 MG tablet Take 1 tablet (500 mg total) by mouth every 8 (eight) hours as needed. 04/25/19   Petrucelli, Samantha R, PA-C  metroNIDAZOLE (FLAGYL) 500 MG tablet Take 1 tablet (500 mg total) by mouth 2 (two) times daily. 04/30/22   Prosperi, Christian H, PA-C  naproxen (NAPROSYN)  500 MG tablet Take 1 tablet (500 mg total) by mouth 2 (two) times daily. 12/05/16   Renne Crigler, PA-C  omeprazole (PRILOSEC) 20 MG capsule Take 20 mg by mouth daily.    [provider]  oxyCODONE (ROXICODONE) 5 MG immediate release tablet Take 1 tablet (5 mg total) by mouth every 6 (six) hours as needed for severe pain. 05/01/22   Dione Booze, MD  phenazopyridine (PYRIDIUM) 200 MG tablet Take 1 tablet (200 mg total) by mouth 3 (three) times daily. 05/05/19   Palumbo, April, MD  Rimegepant Sulfate (NURTEC) 75 MG  TBDP DISSOLVE 1 TABLET ON THE TONGUE DAILY AS NEEDED 09/23/20   [provider]  valACYclovir (VALTREX) 500 MG tablet Take 500 mg by mouth 2 (two) times daily as needed.    [provider]      Allergies    Metformin and related, Wellbutrin [bupropion], and Trulicity [dulaglutide]    Review of Systems   Review of Systems Negative except as per HPI Physical Exam Updated Vital Signs BP (!) 150/92   Pulse 77   Temp 98.3 F (36.8 C) (Oral)   Resp 16   Ht 5\' 7"  (1.702 m)   Wt 88.9 kg   LMP 05/01/2023   SpO2 98%   BMI 30.70 kg/m  Physical Exam Vitals and nursing note reviewed.  Constitutional:      General: She is not in acute distress.    Appearance: She is well-developed. She is not diaphoretic.  HENT:     Head: Normocephalic and atraumatic.  Cardiovascular:     Rate and Rhythm: Normal rate and regular rhythm.     Heart sounds: Normal heart sounds.  Pulmonary:     Effort: Pulmonary effort is normal.     Breath sounds: Normal breath sounds. No decreased breath sounds or wheezing.  Chest:     Chest wall: No tenderness.  Abdominal:     Palpations: Abdomen is soft.     Tenderness: There is no abdominal tenderness.  Musculoskeletal:     Right lower leg: No tenderness. Edema present.     Left lower leg: No tenderness. Edema present.  Skin:    General: Skin is warm and dry.     Findings: No erythema or rash.  Neurological:     Mental Status: She is alert and oriented to person, place, and time.  Psychiatric:        Behavior: Behavior normal.     ED Results / Procedures / Treatments   Labs (all labs ordered are listed, but only abnormal results are displayed) Labs Reviewed  BASIC METABOLIC PANEL - Abnormal; Notable for the following components:      Result Value   Sodium 128 (*)    Glucose, Bld 413 (*)    BUN 30 (*)    Creatinine, Ser 1.47 (*)    Calcium 8.1 (*)    GFR, Estimated 46 (*)    All other components within normal limits  CBC -  Abnormal; Notable for the following components:   WBC 11.4 (*)    RBC 3.81 (*)    All other components within normal limits  URINALYSIS, ROUTINE W REFLEX MICROSCOPIC - Abnormal; Notable for the following components:   Glucose, UA >=500 (*)    Hgb urine dipstick SMALL (*)    Protein, ur >=300 (*)    All other components within normal limits  URINALYSIS, MICROSCOPIC (REFLEX) - Abnormal; Notable for the following components:   Bacteria, UA RARE (*)  All other components within normal limits  CBG MONITORING, ED - Abnormal; Notable for the following components:   Glucose-Capillary 470 (*)    All other components within normal limits  CBG MONITORING, ED - Abnormal; Notable for the following components:   Glucose-Capillary 146 (*)    All other components within normal limits  TROPONIN I (HIGH SENSITIVITY) - Abnormal; Notable for the following components:   Troponin I (High Sensitivity) 149 (*)    All other components within normal limits  PREGNANCY, URINE  RAPID URINE DRUG SCREEN, HOSP PERFORMED  CBG MONITORING, ED  TROPONIN I (HIGH SENSITIVITY)    EKG None  Radiology DG Chest 2 View  Result Date: 05/01/2023 CLINICAL DATA:  Chest pain since this morning. EXAM: CHEST - 2 VIEW COMPARISON:  12/12/2022 FINDINGS: The heart size and mediastinal contours are within normal limits. Both lungs are clear. The visualized skeletal structures are unremarkable. IMPRESSION: No active cardiopulmonary disease. Electronically Signed   By: Burman Nieves M.D.   On: 05/01/2023 20:30    Procedures .Critical Care  Performed by: Jeannie Fend, PA-C Authorized by: Jeannie Fend, PA-C   Critical care provider statement:    Critical care time (minutes):  30   Critical care was time spent personally by me on the following activities:  Development of treatment plan with patient or surrogate, discussions with consultants, evaluation of patient's response to treatment, examination of patient, ordering and  review of laboratory studies, ordering and review of radiographic studies, ordering and performing treatments and interventions, pulse oximetry, re-evaluation of patient's condition and review of old charts     Medications Ordered in ED Medications  sodium chloride 0.9 % bolus 1,000 mL ( Intravenous Stopped 05/01/23 2306)  insulin aspart (novoLOG) injection 8 Units (8 Units Subcutaneous Given 05/01/23 2205)    ED Course/ Medical Decision Making/ A&P                             Medical Decision Making Amount and/or Complexity of Data Reviewed Labs: ordered. Radiology: ordered.  Risk OTC drugs. Prescription drug management. Decision regarding hospitalization.   This patient presents to the ED for concern of hyperglycemia, CP, this involves an extensive number of treatment options, and is a complaint that carries with it a high risk of complications and morbidity.  The differential diagnosis includes but not limited to hyperglycemia, GERD, DKA, ACS   Co morbidities that complicate the patient evaluation  Diabetes, COPD, HIV, asthma, emphysema   Additional history obtained:  External records from outside source obtained and reviewed including prior t-helper lymphs 571 12/14/22   Lab Tests:  I Ordered, and personally interpreted labs.  The pertinent results include: CBC with mild leukocytosis white count 11.4.  BMP with hyponatremia with sodium of 128 likely due to her elevated glucose of 413.  Anion gap and bicarb normal.  Creatinine slightly improved compared to prior on file.  CBG elevated at 470.  Initial troponin returns elevated at 149.   Imaging Studies ordered:  I ordered imaging studies including CXR  I independently visualized and interpreted imaging which showed no acute process I agree with the radiologist interpretation   Cardiac Monitoring: / EKG:  The patient was maintained on a cardiac monitor.  I personally viewed and interpreted the cardiac monitored which  showed an underlying rhythm of: sinus rhythm, rate 73.   Consultations Obtained:  I requested consultation with the Dr. Donnald Garre, ER attending,  and  discussed lab and imaging findings as well as pertinent plan - they recommend: consult cardiology for elevated troponin with non ischemic EKG. Likely admit to [facility] hospitalist service with cardiology consult. Discussed with Dr. Hetty Ely with Cardiology, low suspicion for ACS, advises against heparin, recommends trend trop. If admitted to hospitalist at [facility], Cards can follow. Trop possibly elevated due to hyperglycemia/BP/stress/demand.    Problem List / ED Course / Critical interventions / Medication management  42yo female with hyperglycemia, lower extremity edema, burning in the chest ( no CP at this time). Hyperglycemic, insulin and IVF ordered. Trop elevated with non ischemic EKG. Discussed with cardiology who recommends trend trop. Plan is to admit to hospitalist for monitoring and cardiology consult. Care signed out to Dr. Madilyn Hook at change of shift pending call back from hospitalist.  I ordered medication including IVG insulin  for hyperglycemia   Reevaluation of the patient after these medicines showed that the patient improved, CBG now 146. Plan is to recheck CBG in 30 minutes.  I have reviewed the patients home medicines and have made adjustments as needed   Social Determinants of Health:  Lives with her child, see ID at [place], PCP [facility]    Test / Admission - Considered:  admit         Final Clinical Impression(s) / ED Diagnoses Final diagnoses:  Atypical chest pain  NSTEMI (non-ST elevated myocardial infarction) (HCC)  Hyperglycemia due to diabetes mellitus [facility])    Rx / DC Orders ED Discharge Orders     None         Alden Hipp 05/02/23 Claiborne Billings, MD 05/03/23 2349

## 2023-05-02 ENCOUNTER — Other Ambulatory Visit (HOSPITAL_COMMUNITY): Payer: Self-pay

## 2023-05-02 ENCOUNTER — Observation Stay (HOSPITAL_BASED_OUTPATIENT_CLINIC_OR_DEPARTMENT_OTHER): Payer: Medicaid Other

## 2023-05-02 DIAGNOSIS — N1831 Chronic kidney disease, stage 3a: Secondary | ICD-10-CM | POA: Insufficient documentation

## 2023-05-02 DIAGNOSIS — B20 Human immunodeficiency virus [HIV] disease: Secondary | ICD-10-CM | POA: Insufficient documentation

## 2023-05-02 DIAGNOSIS — R079 Chest pain, unspecified: Secondary | ICD-10-CM

## 2023-05-02 DIAGNOSIS — I214 Non-ST elevation (NSTEMI) myocardial infarction: Secondary | ICD-10-CM | POA: Diagnosis not present

## 2023-05-02 DIAGNOSIS — Z21 Asymptomatic human immunodeficiency virus [HIV] infection status: Secondary | ICD-10-CM | POA: Insufficient documentation

## 2023-05-02 DIAGNOSIS — E1165 Type 2 diabetes mellitus with hyperglycemia: Secondary | ICD-10-CM

## 2023-05-02 DIAGNOSIS — I10 Essential (primary) hypertension: Secondary | ICD-10-CM | POA: Insufficient documentation

## 2023-05-02 LAB — GLUCOSE, CAPILLARY
Glucose-Capillary: 157 mg/dL — ABNORMAL HIGH (ref 70–99)
Glucose-Capillary: 270 mg/dL — ABNORMAL HIGH (ref 70–99)
Glucose-Capillary: 248 mg/dL — ABNORMAL HIGH (ref 70–99)
Glucose-Capillary: 150 mg/dL — ABNORMAL HIGH (ref 70–99)
Glucose-Capillary: 152 mg/dL — ABNORMAL HIGH (ref 70–99)

## 2023-05-02 LAB — RAPID URINE DRUG SCREEN, HOSP PERFORMED
Amphetamines: NOT DETECTED
Barbiturates: NOT DETECTED
Benzodiazepines: NOT DETECTED
Cocaine: NOT DETECTED
Opiates: NOT DETECTED
Tetrahydrocannabinol: NOT DETECTED

## 2023-05-02 LAB — ECHOCARDIOGRAM COMPLETE
AR max vel: 2.55 cm2
AV Area VTI: 2.78 cm2
AV Area mean vel: 2.54 cm2
AV Mean grad: 3 mmHg
AV Peak grad: 4.6 mmHg
Ao pk vel: 1.07 m/s
Area-P 1/2: 3.5 cm2
Height: 67 in
S' Lateral: 3.4 cm
Weight: 3185.6 oz

## 2023-05-02 LAB — HEMOGLOBIN A1C
Hgb A1c MFr Bld: 13.1 % — ABNORMAL HIGH (ref 4.8–5.6)
Mean Plasma Glucose: 329 mg/dL

## 2023-05-02 LAB — URINALYSIS, ROUTINE W REFLEX MICROSCOPIC
Bilirubin Urine: NEGATIVE
Glucose, UA: >=500 mg/dL — AB
Ketones, ur: NEGATIVE mg/dL
Leukocytes,Ua: NEGATIVE
Nitrite: NEGATIVE
Protein, ur: >=300 mg/dL — AB
Specific Gravity, Urine: 1.02 (ref 1.005–1.030)
pH: 6 (ref 5.0–8.0)

## 2023-05-02 LAB — CBG MONITORING, ED
Glucose-Capillary: 117 mg/dL — ABNORMAL HIGH (ref 70–99)
Glucose-Capillary: 146 mg/dL — ABNORMAL HIGH (ref 70–99)
Glucose-Capillary: 65 mg/dL — ABNORMAL LOW (ref 70–99)

## 2023-05-02 LAB — URINALYSIS, MICROSCOPIC (REFLEX)

## 2023-05-02 LAB — HEPATIC FUNCTION PANEL
ALT: 18 U/L (ref 0–44)
AST: 18 U/L (ref 15–41)
Albumin: 2.7 g/dL — ABNORMAL LOW (ref 3.5–5.0)
Alkaline Phosphatase: 61 U/L (ref 38–126)
Bilirubin, Direct: <0.1 mg/dL (ref 0.0–0.2)
Total Bilirubin: 0.3 mg/dL (ref 0.3–1.2)
Total Protein: 6.4 g/dL — ABNORMAL LOW (ref 6.5–8.1)

## 2023-05-02 LAB — HEPARIN LEVEL (UNFRACTIONATED): Heparin Unfractionated: <0.1 IU/mL — ABNORMAL LOW (ref 0.30–0.70)

## 2023-05-02 LAB — CREATININE, SERUM
Creatinine, Ser: 1.15 mg/dL — ABNORMAL HIGH (ref 0.44–1.00)
GFR, Estimated: >60 mL/min (ref >60)

## 2023-05-02 LAB — TROPONIN I (HIGH SENSITIVITY)
Troponin I (High Sensitivity): 113 ng/L (ref <18)
Troponin I (High Sensitivity): 63 ng/L — ABNORMAL HIGH (ref <18)
Troponin I (High Sensitivity): 76 ng/L — ABNORMAL HIGH (ref <18)

## 2023-05-02 LAB — PREGNANCY, URINE: Preg Test, Ur: NEGATIVE

## 2023-05-02 MED ORDER — SODIUM CHLORIDE 0.9% FLUSH: 3.0000 mL | INTRAVENOUS | Status: DC | PRN

## 2023-05-02 MED ORDER — INSULIN ASPART 100 UNIT/ML IJ SOLN: 0.0000 [IU] | Freq: Every day | INTRAMUSCULAR | Status: DC

## 2023-05-02 MED ORDER — INSULIN PEN NEEDLE 32G X 4 MM MISC
1.0000 | Freq: Three times a day (TID) | 0 refills | Status: DC
  Filled 2023-05-02: qty 100, 25d supply, fill #0

## 2023-05-02 MED ORDER — SODIUM CHLORIDE 0.9 % IV SOLN: INTRAVENOUS | Status: DC

## 2023-05-02 MED ORDER — SODIUM CHLORIDE 0.9% FLUSH
3.0000 mL | Freq: Two times a day (BID) | INTRAVENOUS | Status: DC
  Administered 2023-05-03: 3 mL via INTRAVENOUS

## 2023-05-02 MED ORDER — ACETAMINOPHEN 650 MG RE SUPP: 650.0000 mg | Freq: Four times a day (QID) | RECTAL | Status: DC | PRN

## 2023-05-02 MED ORDER — LINAGLIPTIN 5 MG PO TABS
5.0000 mg | ORAL_TABLET | Freq: Every day | ORAL | 3 refills | Status: DC
  Filled 2023-05-02: qty 30, 30d supply, fill #0

## 2023-05-02 MED ORDER — ATORVASTATIN CALCIUM 40 MG PO TABS: 40.0000 mg | ORAL_TABLET | Freq: Every day | ORAL | Status: DC

## 2023-05-02 MED ORDER — ASPIRIN-ACETAMINOPHEN-CAFFEINE 250-250-65 MG PO TABS: 2.0000 | ORAL_TABLET | Freq: Four times a day (QID) | ORAL | Status: DC | PRN

## 2023-05-02 MED ORDER — ONDANSETRON HCL 4 MG PO TABS: 4.0000 mg | ORAL_TABLET | Freq: Four times a day (QID) | ORAL | Status: DC | PRN

## 2023-05-02 MED ORDER — PANTOPRAZOLE SODIUM 40 MG PO TBEC
40.0000 mg | DELAYED_RELEASE_TABLET | Freq: Every day | ORAL | Status: DC
  Administered 2023-05-02: 40 mg via ORAL
  Filled 2023-05-02: qty 1

## 2023-05-02 MED ORDER — PANTOPRAZOLE SODIUM 40 MG PO TBEC
40.0000 mg | DELAYED_RELEASE_TABLET | Freq: Two times a day (BID) | ORAL | Status: DC
  Administered 2023-05-02 – 2023-05-04 (×4): 40 mg via ORAL
  Filled 2023-05-02 (×4): qty 1

## 2023-05-02 MED ORDER — BICTEGRAVIR-EMTRICITAB-TENOFOV 50-200-25 MG PO TABS
1.0000 | ORAL_TABLET | Freq: Every day | ORAL | Status: DC
  Administered 2023-05-02 – 2023-05-04 (×3): 1 via ORAL
  Filled 2023-05-02 (×3): qty 1

## 2023-05-02 MED ORDER — INSULIN GLARGINE-YFGN 100 UNIT/ML ~~LOC~~ SOLN
20.0000 [IU] | Freq: Every day | SUBCUTANEOUS | Status: DC
  Administered 2023-05-02 – 2023-05-04 (×3): 20 [IU] via SUBCUTANEOUS
  Filled 2023-05-02 (×4): qty 0.2

## 2023-05-02 MED ORDER — SODIUM CHLORIDE 0.9 % WEIGHT BASED INFUSION
3.0000 mL/kg/h | INTRAVENOUS | Status: DC
  Administered 2023-05-03: 3 mL/kg/h via INTRAVENOUS

## 2023-05-02 MED ORDER — LOSARTAN POTASSIUM 50 MG PO TABS
50.0000 mg | ORAL_TABLET | Freq: Every day | ORAL | Status: DC
  Administered 2023-05-02: 50 mg via ORAL
  Filled 2023-05-02: qty 1

## 2023-05-02 MED ORDER — INSULIN GLARGINE-YFGN 100 UNIT/ML ~~LOC~~ SOLN: 50.0000 [IU] | Freq: Every day | SUBCUTANEOUS | Status: DC

## 2023-05-02 MED ORDER — METOPROLOL TARTRATE 25 MG PO TABS
25.0000 mg | ORAL_TABLET | Freq: Two times a day (BID) | ORAL | Status: DC
  Administered 2023-05-02: 25 mg via ORAL
  Filled 2023-05-02: qty 1

## 2023-05-02 MED ORDER — ASPIRIN 81 MG PO CHEW
81.0000 mg | CHEWABLE_TABLET | ORAL | Status: AC
  Administered 2023-05-03: 81 mg via ORAL
  Filled 2023-05-02: qty 1

## 2023-05-02 MED ORDER — ACETAMINOPHEN 325 MG PO TABS: 650.0000 mg | ORAL_TABLET | Freq: Four times a day (QID) | ORAL | Status: DC | PRN

## 2023-05-02 MED ORDER — HEPARIN BOLUS VIA INFUSION
4000.0000 [IU] | Freq: Once | INTRAVENOUS | Status: AC
  Administered 2023-05-02: 4000 [IU] via INTRAVENOUS
  Filled 2023-05-02: qty 4000

## 2023-05-02 MED ORDER — HEPARIN (PORCINE) 25000 UT/250ML-% IV SOLN
1500.0000 [IU]/h | INTRAVENOUS | Status: DC
  Administered 2023-05-02: 900 [IU]/h via INTRAVENOUS
  Filled 2023-05-02 (×2): qty 250

## 2023-05-02 MED ORDER — ASPIRIN 81 MG PO CHEW
324.0000 mg | CHEWABLE_TABLET | Freq: Once | ORAL | Status: AC
  Administered 2023-05-02: 324 mg via ORAL
  Filled 2023-05-02: qty 4

## 2023-05-02 MED ORDER — SODIUM CHLORIDE 0.9 % WEIGHT BASED INFUSION: 1.0000 mL/kg/h | INTRAVENOUS | Status: DC

## 2023-05-02 MED ORDER — ENOXAPARIN SODIUM 40 MG/0.4ML IJ SOSY: 40.0000 mg | PREFILLED_SYRINGE | INTRAMUSCULAR | Status: DC

## 2023-05-02 MED ORDER — INSULIN LISPRO (1 UNIT DIAL) 100 UNIT/ML (KWIKPEN)
0.0000 [IU] | PEN_INJECTOR | Freq: Three times a day (TID) | SUBCUTANEOUS | 0 refills | Status: DC
Start: 2023-05-02 — End: 2023-07-22
  Filled 2023-05-02: qty 15, 46d supply, fill #0

## 2023-05-02 MED ORDER — PERFLUTREN LIPID MICROSPHERE
1.0000 mL | INTRAVENOUS | Status: AC | PRN
  Administered 2023-05-02: 2 mL via INTRAVENOUS

## 2023-05-02 MED ORDER — ASPIRIN 81 MG PO CHEW
81.0000 mg | CHEWABLE_TABLET | Freq: Every day | ORAL | Status: DC
  Administered 2023-05-02: 81 mg via ORAL
  Filled 2023-05-02: qty 1

## 2023-05-02 MED ORDER — NITROGLYCERIN 0.4 MG SL SUBL: 0.4000 mg | SUBLINGUAL_TABLET | SUBLINGUAL | Status: DC | PRN

## 2023-05-02 MED ORDER — SODIUM CHLORIDE 0.9 % IV SOLN: 250.0000 mL | INTRAVENOUS | Status: DC | PRN

## 2023-05-02 MED ORDER — CLONAZEPAM 0.5 MG PO TABS
1.0000 mg | ORAL_TABLET | Freq: Two times a day (BID) | ORAL | Status: DC
  Administered 2023-05-02 – 2023-05-03 (×3): 1 mg via ORAL
  Filled 2023-05-02 (×4): qty 2

## 2023-05-02 MED ORDER — INSULIN ASPART 100 UNIT/ML IJ SOLN
0.0000 [IU] | Freq: Three times a day (TID) | INTRAMUSCULAR | Status: DC
  Administered 2023-05-02: 3 [IU] via SUBCUTANEOUS
  Administered 2023-05-02: 7 [IU] via SUBCUTANEOUS
  Administered 2023-05-03: 4 [IU] via SUBCUTANEOUS
  Administered 2023-05-04: 7 [IU] via SUBCUTANEOUS

## 2023-05-02 MED ORDER — ONDANSETRON HCL 4 MG/2ML IJ SOLN: 4.0000 mg | Freq: Four times a day (QID) | INTRAMUSCULAR | Status: DC | PRN

## 2023-05-02 MED ORDER — ATORVASTATIN CALCIUM 80 MG PO TABS
80.0000 mg | ORAL_TABLET | Freq: Every day | ORAL | Status: DC
  Administered 2023-05-02 – 2023-05-03 (×2): 80 mg via ORAL
  Filled 2023-05-02 (×2): qty 1

## 2023-05-02 MED ORDER — ASPIRIN 81 MG PO CHEW: 81.0000 mg | CHEWABLE_TABLET | Freq: Every day | ORAL | Status: DC

## 2023-05-02 MED ORDER — INSULIN ASPART 100 UNIT/ML IJ SOLN: 0.0000 [IU] | INTRAMUSCULAR | Status: DC

## 2023-05-02 MED ORDER — LINAGLIPTIN 5 MG PO TABS
5.0000 mg | ORAL_TABLET | Freq: Every day | ORAL | Status: DC
  Administered 2023-05-02 – 2023-05-04 (×3): 5 mg via ORAL
  Filled 2023-05-02 (×3): qty 1

## 2023-05-02 MED ORDER — SODIUM CHLORIDE 0.9% FLUSH: 3.0000 mL | Freq: Two times a day (BID) | INTRAVENOUS | Status: DC

## 2023-05-02 MED ORDER — ASPIRIN 81 MG PO CHEW
81.0000 mg | CHEWABLE_TABLET | Freq: Every day | ORAL | Status: DC
  Administered 2023-05-04: 81 mg via ORAL
  Filled 2023-05-02: qty 1

## 2023-05-02 NOTE — ED Notes (Signed)
Made several calls to the hospitalist for a consult on this patient however no response yet. Did a follow up and hospitalist is still busy with another case. Dr is aware of the delay on the consult.

## 2023-05-02 NOTE — Progress Notes (Signed)
Patient asking to leave AMA this afternoon.   Seen by Cardiology, and thankfully able to make childcare arrangements so she can stay.  Cardiology have: - Started heparin - Increased atorvastatin to 80 - Started metoprolol   NOTE: She needs refills of home insulins, and (in anticipate of her leaving AMA) these have been sent to [facility] pharmacy and delivered to the room already.   - At the time of discharge, and new cardiac meds (or changes) will need to be sent to pharmacy - Jearld Lesch is not covered by her insurance, and so she will need to follow up with PCP for that

## 2023-05-02 NOTE — ED Notes (Signed)
Transferred to [facility] via [facility]

## 2023-05-02 NOTE — H&P (Signed)
History and Physical    Patient: Renee Bryant UXL:[id number] DOB: 1981/04/04 DOA: 05/01/2023 DOS: the patient was seen and examined on 05/02/2023 PCP: Verneita Griffes, NP  Patient coming from: Home  Chief Complaint:  Chief Complaint  Patient presents with   Chest Pain   HPI: Renee Bryant is a 42 y.o. female with medical history significant of HIV positive with undetectable viral load on medical therapy, insulin-dependent diabetes mellitus, stage III chronic kidney disease, hypertension, GERD, asthma/COPD.  Patient presented for evaluation at [facility] after complaining of burning chest pain that radiated to her left upper extremity and that she states that her arm was "bothering me" she describes the arm as being weak and her fingers were numb and tingling at times but no other focal neurological symptoms and she states that time she has tingling of her hands due to known peripheral neuropathy.  In addition she felt she was having issues with her diabetes management noting her early a.m. sugar was 500 and she gave herself 50 units of Lantus.  While at the freestanding ED her troponin peaked to 149 and given concerns over possible acute coronary syndrome patient was sent to [facility] for further evaluation and treatment.  Of note patient's EKG was unremarkable and she has had no further episodes of chest pain.  Of note she did not have an elevated anion gap.  She was given 8 units of insulin with her glucose decreasing into the 60s.  She was given juice and crackers with her glucose improving to the 100s.  EDP discussed patient's case with cardiologist on-call Dr. Silverio Decamp.  No heparin recommended at this time.  Patient has remained afebrile and normal tensive with normal O2 saturations.  After further discussion between myself and the Pharm.D. it has been determined that patient has been inconsistent with her Lantus intake or use of any insulin.  Her insulin has been adjusted  multiple times by her PCP and increased from 18 over several weeks to 50 units/day.  Patient admits to not following a diabetic diet appropriately.  Patient had done best on the lower dose of Lantus with sliding scale insulin.  Patient reports that her internist opted to try her on a higher dose of Lantus insulin to avoid frequent injections of sliding scale insulin.  In regards to her chest pain this has not recurred since arrival.  Patient does describe significant issues related to GERD and this was one of the reasons why she had to stop taking her GLP-1 medication.  Feel service has been asked to evaluate the patient for admission.   Review of Systems: As mentioned in the history of present illness. All other systems reviewed and are negative. Past Medical History:  Diagnosis Date   Abscess    Asthma    COPD (chronic obstructive pulmonary disease) (HCC)    Diabetes mellitus without complication (HCC)    Emphysema of lung (HCC)    HIV (human immunodeficiency virus infection) (HCC)    Pneumonia    Past Surgical History:  Procedure Laterality Date   CESAREAN SECTION     CHOLECYSTECTOMY     FOOT SURGERY Left    TUBAL LIGATION     Social History:  reports that she has been smoking cigarettes. She has been smoking an average of 1 pack per day. She has never used smokeless tobacco. She reports that she does not drink alcohol and does not use drugs.  Allergies  Allergen Reactions   Metformin And  Related Other (See Comments)    Flu-like symptoms   Wellbutrin [Bupropion]    Trulicity [Dulaglutide] Rash    History reviewed. No pertinent family history.  Prior to Admission medications   Medication Sig Start Date End Date Taking? Authorizing Provider  albuterol (PROVENTIL HFA;VENTOLIN HFA) 108 (90 Base) MCG/ACT inhaler Inhale 2 puffs into the lungs every 4 (four) hours as needed for wheezing or shortness of breath. 02/03/19   Maxwell Caul, PA-C  albuterol (VENTOLIN HFA) 108 (90 Base)  MCG/ACT inhaler Inhale 1-2 puffs into the lungs every 6 (six) hours as needed for wheezing or shortness of breath. 11/11/22   Palumbo, April, MD  atorvastatin (LIPITOR) 40 MG tablet Take by mouth. 09/24/20   [provider]  bictegravir-emtricitabine-tenofovir AF (BIKTARVY) 50-200-25 MG TABS tablet Take by mouth daily.    [provider]  cephALEXin (KEFLEX) 500 MG capsule Take 1 capsule (500 mg total) by mouth 4 (four) times daily. 05/05/19   Palumbo, April, MD  clonazePAM (KLONOPIN) 0.5 MG tablet Take 1 mg by mouth 2 (two) times daily.     [provider]  Dulaglutide (TRULICITY) 3 MG/0.5ML SOPN INJECT 1 PEN UNDER THE SKIN ONCE A WEEK 09/26/20   [provider]  Elviteg-Cobic-Emtricit-TenofAF (GENVOYA PO) Take by mouth.    [provider]  ergocalciferol (VITAMIN D2) 1.25 MG (50000 UT) capsule Take 1 capsule twice a week. 10/09/19   [provider]  GABAPENTIN PO Take by mouth.    [provider]  glipiZIDE (GLUCOTROL) 10 MG tablet Take by mouth. 10/06/15   [provider]  ibuprofen (ADVIL,MOTRIN) 800 MG tablet Take 1 tablet (800 mg total) by mouth every 8 (eight) hours as needed. 11/17/17   Long, Arlyss Repress, MD  insulin glargine (LANTUS) 100 UNIT/ML injection Inject 18 Units into the skin at bedtime.    [provider]  lidocaine (LIDODERM) 5 % Place 1 patch onto the skin daily. Remove & Discard patch within 12 hours or as directed by MD 12/12/22   Rondel Baton, MD  losartan (COZAAR) 50 MG tablet Take 1 tablet (50 mg total) by mouth daily. 07/21/22 08/20/22  Claude Manges, PA-C  methocarbamol (ROBAXIN) 500 MG tablet Take 1 tablet (500 mg total) by mouth every 8 (eight) hours as needed. 04/25/19   Petrucelli, Samantha R, PA-C  metroNIDAZOLE (FLAGYL) 500 MG tablet Take 1 tablet (500 mg total) by mouth 2 (two) times daily. 04/30/22   Prosperi, Christian H, PA-C  naproxen (NAPROSYN) 500 MG tablet Take 1 tablet (500 mg total)  by mouth 2 (two) times daily. 12/05/16   Renne Crigler, PA-C  omeprazole (PRILOSEC) 20 MG capsule Take 20 mg by mouth daily.    [provider]  oxyCODONE (ROXICODONE) 5 MG immediate release tablet Take 1 tablet (5 mg total) by mouth every 6 (six) hours as needed for severe pain. 05/01/22   Dione Booze, MD  phenazopyridine (PYRIDIUM) 200 MG tablet Take 1 tablet (200 mg total) by mouth 3 (three) times daily. 05/05/19   Palumbo, April, MD  Rimegepant Sulfate (NURTEC) 75 MG TBDP DISSOLVE 1 TABLET ON THE TONGUE DAILY AS NEEDED 09/23/20   [provider]  valACYclovir (VALTREX) 500 MG tablet Take 500 mg by mouth 2 (two) times daily as needed.    [provider]    Physical Exam: Vitals:   05/02/23 0200 05/02/23 0345 05/02/23 0427 05/02/23 0829  BP: 115/79 118/74  111/82  Pulse: 75 74  (!) 109  Resp:  19 16  16   Temp: 98.2 F (36.8 C) 98 F (36.7 C) 98.3 F (36.8 C)   TempSrc: Oral Oral Oral Oral  SpO2: 96% 97%  99%  Weight:   90.3 kg   Height:   5\' 7"  (1.702 m)    Constitutional: NAD, calm, comfortable Respiratory: clear to auscultation bilaterally, no wheezing, no crackles. Normal respiratory effort. No accessory muscle use.  Cardiovascular: Regular rate and rhythm, no murmurs / rubs / gallops. No extremity edema. 2+ pedal pulses.   Abdomen: no tenderness, no masses palpated. No hepatosplenomegaly. Bowel sounds positive.  Musculoskeletal: no clubbing / cyanosis. No joint deformity upper and lower extremities. Good ROM, no contractures. Normal muscle tone.  Skin: no rashes, lesions, ulcers. No induration Neurologic: CN 2-12 grossly intact. Sensation intact, Strength 5/5 x all 4 extremities.  Psychiatric: Normal judgment and insight. Alert and oriented x 3. Normal mood.     Data Reviewed:  Sodium 128, potassium 4.2, chloride 98, CO2 22, glucose 413 with an anion gap of 8, BUN 30, creatinine 1.47, LFTs normal, GFR 46  Troponin trend: 113-149-76  WBCs 11,400 and  differential not obtained, hemoglobin 12.8, platelets 247,000  urine pregnancy test negative  Urinalysis was greater than 500 glucose, small hemoglobin, greater than 300 protein, rare bacteria  Urine drug screen negative  Chest x-ray: No acute disease  EKG: Sinus rhythm without any acute ST or T wave changes that would be concerning for ischemia or infarct  Assessment and Plan: Atypical chest pain Patient has had 1 solitary episode of chest pain that was mainly burning in her mid chest that radiated to the left arm and had tingling in her fingertips.  Duration very short in nature no associated symptoms such as diaphoresis dizziness and has had no exertional symptoms As a precaution we will go ahead and check a baseline echocardiogram Troponin trend is downward and likely related to sequelae of significant hyperglycemia, volume depletion etc. Suspect chest pain more likely a reflection of undiagnosed gastroparesis from diabetes  Stage III chronic kidney disease Patient is typically on both Lasix and Aldactone to help promote natural diuresis Given recent elevations in blood glucose (high suspicion glucose may have been near 1000 at home noting she had not taken any medication for about 4 to 5 days) diuretics on hold and IV fluids are being administered at least for the next 24 hours Patient does have significant proteinuria greater than 300 and I suspect patient does have diabetic nephropathy-unclear if she follows with a nephrologist in the outpatient setting Patient will need to follow-up with [facility] clinics for further treatment  Diabetes mellitus 2 on chronic insulin with hyperglycemia Current hyperglycemia secondary to patient not taking dose as prescribed and in addition not taking any insulin Patient reports excess with a lower dose of longer acting insulin and utilization of sliding scale insulin 4 times a day although this regimen is difficult at times according to  the patient Follow CBGs and provide resistant SSI Patient would like to be on a disposable insulin pump such as an OmniPod but is not sure if Medicaid would cover.  Will also need to investigate if Medicaid would cover a CGM for more real time following of glucose readings Discussed with pharmacist and will start Tradjenta (DPP-4 inhibitor) Patient does not tolerate SGLT2s secondary to recurrent yeast infections Patient had significant GI side effects from GLP-1's and this is likely due to undetected gastroparesis  GERD/Suspected gastroparesis Continue home PPI but increase to  twice daily May need formal evaluation with PCP after discharge  Hypertension Continue Cozaar or therapeutic substitution   Advance Care Planning:   Code Status: Full Code   VTE prophylaxis: Lovenox  Consults: Cardiology  Family Communication: Patient only  Severity of Illness: The appropriate patient status for this patient is OBSERVATION. Observation status is judged to be reasonable and necessary in order to provide the required intensity of service to ensure the patient's safety. The patient's presenting symptoms, physical exam findings, and initial radiographic and laboratory data in the context of their medical condition is felt to place them at decreased risk for further clinical deterioration. Furthermore, it is anticipated that the patient will be medically stable for discharge from the hospital within 2 midnights of admission.   Author: Junious Silk, NP 05/02/2023 11:11 AM  For on call review [email or web address].

## 2023-05-02 NOTE — ED Notes (Signed)
Report provided to [facility] for transport. 

## 2023-05-02 NOTE — ED Notes (Signed)
[facility] called for transport @02 :48 am

## 2023-05-02 NOTE — Consult Note (Signed)
Cardiology Consultation:  Patient ID: Renee Bryant MRN: [id number]; DOB: 11-28-1980  Admit date: 05/01/2023 Date of Consult: 05/02/2023  Primary Care Provider: Verneita Griffes, NP Primary Cardiologist: None  Primary Electrophysiologist:  None   Patient Profile:  Renee Bryant is a 42 y.o. female with a hx of DM, HIV who is being seen today for the evaluation of NSTEMI at the request of Ival Bible, MD.  History of Present Illness:  Renee Bryant presents with 1 day history of chest discomfort.  Described as burning in her chest.  She reports she woke up yesterday with burning in her chest.  She reports Pepcid partially relieved the symptoms if they return.  She also reports nausea and vomiting and radiation into the left arm with the burning sensation.  Also describes shortness of breath.  Reports symptoms lasted most of the day.  She went to [facility] was found to have significant hyperglycemia.  High sensitive troponin 113 and increased to 146 on repeat.  Has been trending down.  Sodium 128.  Serum creatinine 1.43.  Glucose 413.  WBC 11.4.  EKG shows sinus rhythm with no acute ischemic changes.  Chest x-ray clear.  Transferred to [facility] for non-STEMI.  Echocardiogram with EF 45 to 50% with distal apical hypokinesis.  She currently reports no further burning in her chest.  She was actually trying to leave AGAINST MEDICAL ADVICE.  We did discuss that she would likely not survive if she left the hospital as she is actively having a myocardial infarction.  She has now agreed to stay.  Of note, she has a history of diabetes for nearly 25 years.  It is uncontrolled.  A1c is pending.  She reports issues with her outpatient diabetes doctor.  She is also an every day smoker.  She smoked for 30+ years.  We discussed that this is likely the issue.  She is a bit surprised to have a myocardial infarction at age 40.  Of note the patient follows at [facility] for dysfunctional  uterine bleeding.  She apparently has uterine fibroids.  Her gynecologist is planning for hysterectomy at some point.  Hemoglobin is stable.  No active bleeding.  I think she is okay to proceed with left heart catheterization.  Heart Pathway Score:       Past Medical History: Past Medical History:  Diagnosis Date   Abscess    Asthma    COPD (chronic obstructive pulmonary disease) (HCC)    Diabetes mellitus without complication (HCC)    Emphysema of lung (HCC)    HIV (human immunodeficiency virus infection) (HCC)    Pneumonia     Past Surgical History: Past Surgical History:  Procedure Laterality Date   CESAREAN SECTION     CHOLECYSTECTOMY     FOOT SURGERY Left    TUBAL LIGATION       Home Medications:  Prior to Admission medications   Medication Sig Start Date End Date Taking? Authorizing Provider  bictegravir-emtricitabine-tenofovir AF (BIKTARVY) 50-200-25 MG TABS tablet Take by mouth daily.   Yes [provider]  clonazePAM (KLONOPIN) 0.5 MG tablet Take 1 mg by mouth 2 (two) times daily.    Yes [provider]  furosemide (LASIX) 40 MG tablet Take 40 mg by mouth.   Yes [provider]  insulin lispro (HUMALOG) 100 UNIT/ML KwikPen Inject 0-11 Units into the skin 3 (three) times daily with meals based on sliding scale: Blood Glucose <150= 0 unit; BG 150-200= 1  unit; BG 201-250= 3 unit; BG 251-300= 5 unit; BG 301-350= 7 unit; BG 351-400= 9 unit; BG >400= 11 unit and Call Primary Care. 05/02/23  Yes Danford, Earl Lites, MD  Insulin Pen Needle 32G X 4 MM MISC Use three times daily with Humalog kwikpen and with glargine 05/02/23  Yes Danford, Earl Lites, MD  losartan (COZAAR) 50 MG tablet Take 1 tablet (50 mg total) by mouth daily. 07/21/22 05/02/23 Yes Soto, Johana, PA-C  pravastatin (PRAVACHOL) 40 MG tablet Take 40 mg by mouth daily.   Yes [provider]  spironolactone (ALDACTONE) 25 MG tablet Take 25 mg by mouth daily.   Yes [provider]  albuterol (PROVENTIL HFA;VENTOLIN HFA) 108 (90 Base) MCG/ACT inhaler Inhale 2 puffs into the lungs every 4 (four) hours as needed for wheezing or shortness of breath. 02/03/19   Maxwell Caul, PA-C  albuterol (VENTOLIN HFA) 108 (90 Base) MCG/ACT inhaler Inhale 1-2 puffs into the lungs every 6 (six) hours as needed for wheezing or shortness of breath. 11/11/22   Palumbo, April, MD  aspirin 81 MG chewable tablet Chew 1 tablet (81 mg total) by mouth daily. 05/03/23   Danford, Earl Lites, MD  atorvastatin (LIPITOR) 40 MG tablet Take by mouth. 09/24/20   [provider]  cephALEXin (KEFLEX) 500 MG capsule Take 1 capsule (500 mg total) by mouth 4 (four) times daily. 05/05/19   Palumbo, April, MD  Dulaglutide (TRULICITY) 3 MG/0.5ML SOPN INJECT 1 PEN UNDER THE SKIN ONCE A WEEK 09/26/20   [provider]  Elviteg-Cobic-Emtricit-TenofAF (GENVOYA PO) Take by mouth.    [provider]  ergocalciferol (VITAMIN D2) 1.25 MG (50000 UT) capsule Take 1 capsule twice a week. 10/09/19   [provider]  GABAPENTIN PO Take by mouth.    [provider]  glipiZIDE (GLUCOTROL) 10 MG tablet Take by mouth. 10/06/15   [provider]  ibuprofen (ADVIL,MOTRIN) 800 MG tablet Take 1 tablet (800 mg total) by mouth every 8 (eight) hours as needed. 11/17/17   Long, Arlyss Repress, MD  insulin glargine (LANTUS) 100 UNIT/ML injection Inject 18 Units into the skin at bedtime.    [provider]  lidocaine (LIDODERM) 5 % Place 1 patch onto the skin daily. Remove & Discard patch within 12 hours or as directed by MD 12/12/22   Rondel Baton, MD  methocarbamol (ROBAXIN) 500 MG tablet Take 1 tablet (500 mg total) by mouth every 8 (eight) hours as needed. 04/25/19   Petrucelli, Samantha R, PA-C  metroNIDAZOLE (FLAGYL) 500 MG tablet Take 1 tablet (500 mg total) by mouth 2 (two) times daily. 04/30/22   Prosperi, Christian H, PA-C  naproxen (NAPROSYN) 500 MG tablet Take  1 tablet (500 mg total) by mouth 2 (two) times daily. 12/05/16   Renne Crigler, PA-C  omeprazole (PRILOSEC) 20 MG capsule Take 20 mg by mouth daily.    [provider]  oxyCODONE (ROXICODONE) 5 MG immediate release tablet Take 1 tablet (5 mg total) by mouth every 6 (six) hours as needed for severe pain. 05/01/22   Dione Booze, MD  phenazopyridine (PYRIDIUM) 200 MG tablet Take 1 tablet (200 mg total) by mouth 3 (three) times daily. 05/05/19   Palumbo, April, MD  Rimegepant Sulfate (NURTEC) 75 MG TBDP DISSOLVE 1 TABLET ON THE TONGUE DAILY AS NEEDED 09/23/20   [provider]  valACYclovir (VALTREX) 500 MG tablet Take 500 mg by mouth 2 (two) times daily as needed.    [provider]    Inpatient Medications: Scheduled Meds:  aspirin  81 mg Oral Daily   atorvastatin  40 mg Oral Daily   bictegravir-emtricitabine-tenofovir AF  1 tablet Oral Daily   clonazePAM  1 mg Oral BID   enoxaparin (LOVENOX) injection  40 mg Subcutaneous Q24H   insulin aspart  0-20 Units Subcutaneous TID WC   insulin aspart  0-5 Units Subcutaneous QHS   insulin glargine-yfgn  20 Units Subcutaneous Daily   linagliptin  5 mg Oral Daily   losartan  50 mg Oral Daily   pantoprazole  40 mg Oral BID AC   sodium chloride flush  3 mL Intravenous Q12H   Continuous Infusions:  sodium chloride 100 mL/hr at 05/02/23 1014   PRN Meds: acetaminophen **OR** acetaminophen, aspirin-acetaminophen-caffeine, nitroGLYCERIN, ondansetron **OR** ondansetron (ZOFRAN) IV  Allergies:    Allergies  Allergen Reactions   Metformin And Related Other (See Comments)    Flu-like symptoms   Wellbutrin [Bupropion]    Trulicity [Dulaglutide] Rash    Social History:   Social History   Socioeconomic History   Marital status: Married    Spouse name: Not on file   Number of children: Not on file   Years of education: Not on file   Highest education level: Not on file  Occupational History   Not on file  Tobacco Use    Smoking status: Every Day    Packs/day: 1    Types: Cigarettes   Smokeless tobacco: Never  Vaping Use   Vaping Use: Never used  Substance and Sexual Activity   Alcohol use: No   Drug use: No   Sexual activity: Not on file  Other Topics Concern   Not on file  Social History Narrative   Not on file   Social Determinants of Health   Financial Resource Strain: Not on file  Food Insecurity: Patient Declined (05/02/2023)   Hunger Vital Sign    Worried About Running Out of Food in the Last Year: Patient declined    Ran Out of Food in the Last Year: Patient declined  Transportation Needs: No Transportation Needs (05/02/2023)   PRAPARE - Administrator, Civil Service (Medical): No    Lack of Transportation (Non-Medical): No  Physical Activity: Not on file  Stress: Not on file  Social Connections: Not on file  Intimate Partner Violence: Not At Risk (05/02/2023)   Humiliation, Afraid, Rape, and Kick questionnaire    Fear of Current or Ex-Partner: No    Emotionally Abused: No    Physically Abused: No    Sexually Abused: No     Family History:   History reviewed. No pertinent family history.   ROS:  All other ROS reviewed and negative. Pertinent positives noted in the HPI.     Physical Exam/Data:   Vitals:   05/02/23 0345 05/02/23 0427 05/02/23 0829 05/02/23 1211  BP: 118/74  111/82 (!) 145/98  Pulse: 74  (!) 109 77  Resp: 16  16 18   Temp: 98 F (36.7 C) 98.3 F (36.8 C)  98.7 F (37.1 C)  TempSrc: Oral Oral Oral Oral  SpO2: 97%  99% 99%  Weight:  90.3 kg    Height:  5\' 7"  (1.702 m)      Intake/Output Summary (Last 24 hours) at 05/02/2023 1542 Last data filed at 05/01/2023 2308 Gross per 24 hour  Intake 1000.19 ml  Output --  Net 1000.19 ml       05/02/2023    4:27 AM  05/01/2023    8:06 PM 03/13/2023    6:23 PM  Last 3 Weights  Weight (lbs) 199 lb 1.6 oz 196 lb 196 lb  Weight (kg) 90.311 kg 88.905 kg 88.905 kg    Body mass index is 31.18 kg/m.  General: Well  nourished, well developed, in no acute distress Head: Atraumatic, normal size  Eyes: PEERLA, EOMI  Neck: Supple, no JVD Endocrine: No thryomegaly Cardiac: Normal S1, S2; RRR; no murmurs, rubs, or gallops Lungs: Clear to auscultation bilaterally, no wheezing, rhonchi or rales  Abd: Soft, nontender, no hepatomegaly  Ext: No edema, pulses 2+ Musculoskeletal: No deformities, BUE and BLE strength normal and equal Skin: Warm and dry, no rashes   Neuro: Alert and oriented to person, place, time, and situation, CNII-XII grossly intact, no focal deficits  Psych: Normal mood and affect   EKG:  The EKG was personally reviewed and demonstrates: Sinus rhythm heart rate 73, no acute ischemic changes Telemetry:  Telemetry was personally reviewed and demonstrates: Sinus rhythm 70  Relevant CV Studies: LVEF 45-50%, apical septal hypokinesis noted  Laboratory Data: High Sensitivity Troponin:   Recent Labs  Lab 05/01/23 0009 05/01/23 2127 05/02/23 0711 05/02/23 1039  TROPONINIHS 113* 149* 76* 63*     Cardiac EnzymesNo results for input(s): "TROPONINI" in the last 168 hours. No results for input(s): "TROPIPOC" in the last 168 hours.  Chemistry Recent Labs  Lab 05/01/23 2127 05/02/23 1039  NA 128*  --   K 4.2  --   CL 98  --   CO2 22  --   GLUCOSE 413*  --   BUN 30*  --   CREATININE 1.47* 1.15*  CALCIUM 8.1*  --   GFRNONAA 46* >60  ANIONGAP 8  --     Recent Labs  Lab 05/01/23 2127  PROT 6.4*  ALBUMIN 2.7*  AST 18  ALT 18  ALKPHOS 61  BILITOT 0.3   Hematology Recent Labs  Lab 05/01/23 2127  WBC 11.4*  RBC 3.81*  HGB 12.8  HCT 36.0  MCV 94.5  MCH 33.6  MCHC 35.6  RDW 11.8  PLT 247   BNPNo results for input(s): "BNP", "PROBNP" in the last 168 hours.  DDimer No results for input(s): "DDIMER" in the last 168 hours.  Radiology/Studies:  ECHOCARDIOGRAM COMPLETE  Result Date: 05/02/2023    ECHOCARDIOGRAM REPORT   Patient Name:   Renee Bryant Date of Exam: 05/02/2023  Medical Rec #:  [id number]    Height:       67.0 in Accession #:    [id number]   Weight:       199.1 lb Date of Birth:  1981-08-04    BSA:          2.018 m Patient Age:    41 years     BP:           118/74 mmHg Patient Gender: F            HR:           72 bpm. Exam Location:  Inpatient Procedure: 2D Echo, Cardiac Doppler, Color Doppler and Intracardiac            Opacification Agent Indications:    Chest Pain  History:        Patient has no prior history of Echocardiogram examinations.                 Risk Factors:Hypertension and Diabetes.  Sonographer:    Darlys Gales Referring Phys:  [id number] ALLISON L ELLIS IMPRESSIONS  1. Distal septal apical hypokinesis . Left ventricular ejection fraction, by estimation, is 45 to 50%. The left ventricle has mildly decreased function. The left ventricle demonstrates regional wall motion abnormalities (see scoring diagram/findings for  description). The left ventricular internal cavity size was mildly dilated. Left ventricular diastolic parameters were normal.  2. Right ventricular systolic function is normal. The right ventricular size is normal.  3. The mitral valve is abnormal. Trivial mitral valve regurgitation. No evidence of mitral stenosis.  4. The aortic valve was not well visualized. There is mild calcification of the aortic valve. Aortic valve regurgitation is not visualized. Aortic valve sclerosis is present, with no evidence of aortic valve stenosis.  5. The inferior vena cava is normal in size with greater than 50% respiratory variability, suggesting right atrial pressure of 3 mmHg. FINDINGS  Left Ventricle: Distal septal apical hypokinesis. Left ventricular ejection fraction, by estimation, is 45 to 50%. The left ventricle has mildly decreased function. The left ventricle demonstrates regional wall motion abnormalities. Definity contrast agent was given IV to delineate the left ventricular endocardial borders. The left ventricular internal cavity size was mildly  dilated. There is no left ventricular hypertrophy. Left ventricular diastolic parameters were normal. Right Ventricle: The right ventricular size is normal. No increase in right ventricular wall thickness. Right ventricular systolic function is normal. Left Atrium: Left atrial size was normal in size. Right Atrium: Right atrial size was normal in size. Pericardium: There is no evidence of pericardial effusion. Mitral Valve: The mitral valve is abnormal. There is mild thickening of the mitral valve leaflet(s). Trivial mitral valve regurgitation. No evidence of mitral valve stenosis. Tricuspid Valve: The tricuspid valve is normal in structure. Tricuspid valve regurgitation is not demonstrated. No evidence of tricuspid stenosis. Aortic Valve: The aortic valve was not well visualized. There is mild calcification of the aortic valve. Aortic valve regurgitation is not visualized. Aortic valve sclerosis is present, with no evidence of aortic valve stenosis. Aortic valve mean gradient measures 3.0 mmHg. Aortic valve peak gradient measures 4.6 mmHg. Aortic valve area, by VTI measures 2.78 cm. Pulmonic Valve: The pulmonic valve was normal in structure. Pulmonic valve regurgitation is not visualized. No evidence of pulmonic stenosis. Aorta: The aortic root is normal in size and structure. Venous: The inferior vena cava is normal in size with greater than 50% respiratory variability, suggesting right atrial pressure of 3 mmHg. IAS/Shunts: No atrial level shunt detected by color flow Doppler.  LEFT VENTRICLE PLAX 2D LVIDd:         4.80 cm   Diastology LVIDs:         3.40 cm   LV e' medial:    7.94 cm/s LV PW:         0.70 cm   LV E/e' medial:  7.0 LV IVS:        0.90 cm   LV e' lateral:   9.57 cm/s LVOT diam:     2.00 cm   LV E/e' lateral: 5.8 LV SV:         58 LV SV Index:   29 LVOT Area:     3.14 cm  RIGHT VENTRICLE         IVC TAPSE (M-mode): 1.8 cm  IVC diam: 1.80 cm LEFT ATRIUM             Index        RIGHT ATRIUM           Index  LA Vol (A2C):   23.8 ml 11.79 ml/m  RA Area:     7.08 cm LA Vol (A4C):   28.3 ml 14.02 ml/m  RA Volume:   11.50 ml 5.70 ml/m LA Biplane Vol: 28.1 ml 13.92 ml/m  AORTIC VALVE AV Area (Vmax):    2.55 cm AV Area (Vmean):   2.54 cm AV Area (VTI):     2.78 cm AV Vmax:           107.00 cm/s AV Vmean:          80.200 cm/s AV VTI:            0.208 m AV Peak Grad:      4.6 mmHg AV Mean Grad:      3.0 mmHg LVOT Vmax:         87.00 cm/s LVOT Vmean:        64.900 cm/s LVOT VTI:          0.184 m LVOT/AV VTI ratio: 0.88  AORTA Ao Root diam: 3.10 cm MITRAL VALVE MV Area (PHT): 3.50 cm    SHUNTS MV Decel Time: 217 msec    Systemic VTI:  0.18 m MV E velocity: 55.50 cm/s  Systemic Diam: 2.00 cm MV A velocity: 61.80 cm/s MV E/A ratio:  0.90 Charlton Haws MD Electronically signed by Charlton Haws MD Signature Date/Time: 05/02/2023/12:05:04 PM    Final    DG Chest 2 View  Result Date: 05/01/2023 CLINICAL DATA:  Chest pain since this morning. EXAM: CHEST - 2 VIEW COMPARISON:  12/12/2022 FINDINGS: The heart size and mediastinal contours are within normal limits. Both lungs are clear. The visualized skeletal structures are unremarkable. IMPRESSION: No active cardiopulmonary disease. Electronically Signed   By: Burman Nieves M.D.   On: 05/01/2023 20:30    Assessment and Plan:   # Non-STEMI -Admitted with 1 day of burning in her chest.  Troponins are mildly elevated and trending down.  Echocardiogram shows apical septal hypokinesis with an ejection fraction of 45 to 50%.  EKG is nonischemic.  No further chest symptoms.  CV risk factors include uncontrolled diabetes and ongoing tobacco abuse. -We discussed the benefits of invasive angiography and likely percutaneous coronary intervention.  We will plan for this tomorrow.  N.p.o. at midnight. -No signs of heart failure.  Start metoprolol tartrate 25 mg twice daily.  Increase Lipitor to 80 mg daily.  Start heparin drip.  I have ordered a TSH lipids and LP(a) for  tomorrow morning.  Her A1c is pending. -Of note she does have dysfunctional uterine bleeding secondary to uterine fibroids.  Things are stable currently.  Plan for outpatient hysterectomy probably in the fall.  I do not think this will delay or reduce her candidacy for invasive angiography.  # Hyponatremia -Suspect this is secondary to hyperglycemia.  Needs better glycemic control.  # Diabetes -Per hospital medicine  For questions or updates, please contact [facility] Please consult [email or web address] for contact info under   Signed, Gerri Spore T. Flora Lipps, MD, [facility]  [facility]  05/02/2023 3:42 PM

## 2023-05-02 NOTE — ED Notes (Signed)
Bed assigned for inpatient room, pt made aware, asked if possible to change to request admit to [facility].  Dr Madilyn Hook made aware.

## 2023-05-02 NOTE — Inpatient Diabetes Management (Signed)
Inpatient Diabetes Program Recommendations  AACE/ADA: New Consensus Statement on Inpatient Glycemic Control (2015)  Target Ranges:  Prepandial:   less than 140 mg/dL      Peak postprandial:   less than 180 mg/dL (1-2 hours)      Critically ill patients:  140 - 180 mg/dL   Lab Results  Component Value Date   GLUCAP 157 (H) 05/02/2023    Review of Glycemic Control  Latest Reference Range & Units 05/02/23 06:33  Glucose-Capillary 70 - 99 mg/dL 161 (H)  (H): Data is abnormally high Diabetes history: Type 2 DM Outpatient Diabetes medications: Trulicity 3 mg qwk, Glipizide 10 mg QD, Lantus 18 units QD Current orders for Inpatient glycemic control: Semglee 20 units QD, Novolog 0-20 units TID & HS, Tradjenta 5 mg QD Novolog 8 units TID  Inpatient Diabetes Program Recommendations:   Noted hypoglycemia this AM following Novolog 8 units x 1. Consider reducing correction to Novolog 0-6 units TID & HS.  Thanks, Lujean Rave, MSN, RNC-OB Diabetes Coordinator [phone] (8a-5p)

## 2023-05-02 NOTE — ED Provider Notes (Signed)
Care assumed at midnight awaiting hospitalist discussion for admission.   Repeat troponin is downtrending.  Discussed with Dr. Hetty Ely with cardiology-does not recommend initiating heparin at this time.   Patient received 8 units of insulin due to hyperglycemia and repeat blood glucose decreased to the 60s.  She was treated with juice and crackers with improvement in her glucose to the 100s.    Discussed with Dr. Loney Loh at [phone] regarding patient's status, plan to admit for ongoing care.   Tilden Fossa, MD 05/02/23 [phone]

## 2023-05-02 NOTE — Plan of Care (Signed)

## 2023-05-02 NOTE — Progress Notes (Signed)
ANTICOAGULATION CONSULT NOTE - Follow Up Consult  Pharmacy Consult for IV Heparin Indication: chest pain/ACS  Allergies  Allergen Reactions   Metformin And Related Other (See Comments)    Flu-like symptoms   Wellbutrin [Bupropion]    Trulicity [Dulaglutide] Rash    Patient Measurements: Height: 5\' 7"  (170.2 cm) Weight: 90.3 kg (199 lb 1.6 oz) IBW/kg (Calculated) : 61.6 Heparin Dosing Weight: 81 kg  Vital Signs: Temp: 98.7 F (37.1 C) (06/06 1211) Temp Source: Oral (06/06 1211) BP: 145/98 (06/06 1211) Pulse Rate: 77 (06/06 1211)  Labs: Recent Labs    05/01/23 2127 05/02/23 0711 05/02/23 1039  HGB 12.8  --   --   HCT 36.0  --   --   PLT 247  --   --   CREATININE 1.47*  --  1.15*  TROPONINIHS 149* 76* 63*    Estimated Creatinine Clearance: 75 mL/min (A) (by C-G formula based on SCr of 1.15 mg/dL (H)).   Medications:  Medications Prior to Admission  Medication Sig Dispense Refill Last Dose   albuterol (PROVENTIL HFA;VENTOLIN HFA) 108 (90 Base) MCG/ACT inhaler Inhale 2 puffs into the lungs every 4 (four) hours as needed for wheezing or shortness of breath. 1 Inhaler 0    albuterol (VENTOLIN HFA) 108 (90 Base) MCG/ACT inhaler Inhale 1-2 puffs into the lungs every 6 (six) hours as needed for wheezing or shortness of breath. 1 each 0    atorvastatin (LIPITOR) 40 MG tablet Take by mouth.      bictegravir-emtricitabine-tenofovir AF (BIKTARVY) 50-200-25 MG TABS tablet Take by mouth daily.      cephALEXin (KEFLEX) 500 MG capsule Take 1 capsule (500 mg total) by mouth 4 (four) times daily. 28 capsule 0    clonazePAM (KLONOPIN) 0.5 MG tablet Take 1 mg by mouth 2 (two) times daily.       Dulaglutide (TRULICITY) 3 MG/0.5ML SOPN INJECT 1 PEN UNDER THE SKIN ONCE A WEEK      Elviteg-Cobic-Emtricit-TenofAF (GENVOYA PO) Take by mouth.      ergocalciferol (VITAMIN D2) 1.25 MG (50000 UT) capsule Take 1 capsule twice a week.      GABAPENTIN PO Take by mouth.      glipiZIDE (GLUCOTROL)  10 MG tablet Take by mouth.      ibuprofen (ADVIL,MOTRIN) 800 MG tablet Take 1 tablet (800 mg total) by mouth every 8 (eight) hours as needed. 21 tablet 0    insulin glargine (LANTUS) 100 UNIT/ML injection Inject 18 Units into the skin at bedtime.      lidocaine (LIDODERM) 5 % Place 1 patch onto the skin daily. Remove & Discard patch within 12 hours or as directed by MD 7 patch 0    losartan (COZAAR) 50 MG tablet Take 1 tablet (50 mg total) by mouth daily. 30 tablet 0    methocarbamol (ROBAXIN) 500 MG tablet Take 1 tablet (500 mg total) by mouth every 8 (eight) hours as needed. 15 tablet 0    metroNIDAZOLE (FLAGYL) 500 MG tablet Take 1 tablet (500 mg total) by mouth 2 (two) times daily. 14 tablet 0    naproxen (NAPROSYN) 500 MG tablet Take 1 tablet (500 mg total) by mouth 2 (two) times daily. 20 tablet 0    omeprazole (PRILOSEC) 20 MG capsule Take 20 mg by mouth daily.      oxyCODONE (ROXICODONE) 5 MG immediate release tablet Take 1 tablet (5 mg total) by mouth every 6 (six) hours as needed for severe pain. 10 tablet 0  phenazopyridine (PYRIDIUM) 200 MG tablet Take 1 tablet (200 mg total) by mouth 3 (three) times daily. 6 tablet 0    Rimegepant Sulfate (NURTEC) 75 MG TBDP DISSOLVE 1 TABLET ON THE TONGUE DAILY AS NEEDED      valACYclovir (VALTREX) 500 MG tablet Take 500 mg by mouth 2 (two) times daily as needed.       Assessment: 42 years of age female who presented with hyperglycemia and atypical chest pain. Her pain is resolved. ECHO today shows reduced EF. Cardiology on board. Pharmacy consulted for IV heparin. Troponin trending down. CBC on 6/5 wnl. No bleeding reported. Patient not on anticoagulants prior to admission.   Goal of Therapy:  Heparin level 0.3-0.7 units/ml Monitor platelets by anticoagulation protocol: Yes   Plan:  Give 4000 units bolus x 1 Start heparin infusion at 900  units/hr Check anti-Xa level in 6 hours and daily while on heparin Continue to monitor H&H and  platelets  Link Snuffer, PharmD, BCPS, BCCCP Clinical Pharmacist Please refer to [facility] for [facility] Pharmacy numbers 05/02/2023,4:11 PM

## 2023-05-02 NOTE — Hospital Course (Addendum)
41 y.o.f w/ HIV well controlled, IDDM, and HTN who presented with hyperglycemia and atypical chest pain.She had a change in PCP recently, and had to switch from Humalog to Novolog which caused swelling so she stopped it (she is stable on glargine insulin),also had intolerance of GLP1 agonist.In that context, has noted sugars to be very elevated recently. Then in the last day, she was having new chest pain, not relieved with antacids or exacerbated by food.  Somewhat worse with exertion (although she is sedentary).   In the ER, ECG showed no ST changes, but new late R wave progression.  Troponin mildly elevated 113 > 169 > 70s > 60s. This was thought to be due to hyperglycemia/physiologic stress, so she was admitted on insulin and fluids.  Creatinine elevated 1.4. Seen by cardiology felt to be non-ST elevation MI echo with EF 45-50% with apical septal hypokinesia with uncontrolled diabetes ongoing tobacco abuse recommended cardiac cath 6/7 that showed -"Single vessel obstructive CAD involving the first diagonal. Cardio advised aggressive medical therapy and risk factor modification. Given ostial location of diagonal stenosis PCI is less than ideal since this could lead to plaque shift/impingement of the LAD. There is moderate nonobstructive disease in the mid LAD. Will continue metoprolol. Given soft BP will add Ranexa. Would add Plavix for ACS indication but this will need to be weighed against risk of bleeding with history of uterine bleeding "

## 2023-05-02 NOTE — Plan of Care (Signed)
Plan of care review.

## 2023-05-02 NOTE — Progress Notes (Signed)
Patient arrived from [place] [facility] via [facility]. AO x4. Denies any pain or discomfort. Vitals stable, CHG completed . Connected to tele and CCMD notified. Oriented pt to room and call bell system. Call bell within reach. [facility] notified. Plan of care continues.

## 2023-05-02 NOTE — Progress Notes (Signed)
Plan of Care Note for accepted transfer   Patient: Renee Bryant MRN: [id number]   DOA: 05/01/2023  Facility requesting transfer: [facility]  Requesting Provider: Dr. Pecola Leisure  Reason for transfer: Chest pain  Facility course: 42 year old female with history of asthma/COPD, insulin-dependent diabetes, HIV presented to the ED with complaints of hyperglycemia and burning sensation in her chest radiating to her left arm.  Vital signs on arrival to the ED: Temperature 98.3 F, pulse 90, respiratory rate 20, blood pressure 185/111, SpO2 99% on room air.  Labs significant for WBC 11.4, sodium 128, glucose 413, bicarb 22, anion gap 8, BUN 30, creatinine 1.4, troponin 113> 149, UDS negative.  Chest x-ray showing no active cardiopulmonary disease.  EKG without acute ischemic changes.  ED physician discussed with on-call cardiologist who felt that the patient's chest pain is atypical and recommended holding off starting IV heparin.  Patient was given aspirin, NovoLog 8 units, and 1 L normal saline bolus.  Glucose down to 60s after insulin, improved to 110s after patient ate food.  Plan of care: The patient is accepted for admission to Progressive unit, at [facility].  [facility] will assume care on arrival to accepting facility. Until arrival, care as per EDP. However, [facility] available 24/7 for questions and assistance.  Author: John Giovanni, MD 05/02/2023  Check [email or web address] for on-call coverage.  Nursing staff, Please call [facility] Admits & Consults System-Wide number on Amion as soon as patient's arrival, so appropriate admitting provider can evaluate the pt.

## 2023-05-02 NOTE — Progress Notes (Signed)
Patient expressed to staff that she wants to leave Against Medical Advice due to child care issues. Call placed to attending physician by primary nurse, as well as placing a call and page to cardiology.

## 2023-05-03 ENCOUNTER — Encounter (HOSPITAL_COMMUNITY)
Admission: EM | Disposition: A | Discharge status: Home / Self Care | Payer: Self-pay | Source: Home / Self Care | Attending: Internal Medicine

## 2023-05-03 DIAGNOSIS — E11649 Type 2 diabetes mellitus with hypoglycemia without coma: Secondary | ICD-10-CM | POA: Diagnosis not present

## 2023-05-03 DIAGNOSIS — Z7984 Long term (current) use of oral hypoglycemic drugs: Secondary | ICD-10-CM | POA: Diagnosis not present

## 2023-05-03 DIAGNOSIS — I2510 Atherosclerotic heart disease of native coronary artery without angina pectoris: Secondary | ICD-10-CM | POA: Diagnosis not present

## 2023-05-03 DIAGNOSIS — E669 Obesity, unspecified: Secondary | ICD-10-CM | POA: Diagnosis present

## 2023-05-03 DIAGNOSIS — D259 Leiomyoma of uterus, unspecified: Secondary | ICD-10-CM | POA: Diagnosis present

## 2023-05-03 DIAGNOSIS — E785 Hyperlipidemia, unspecified: Secondary | ICD-10-CM | POA: Diagnosis present

## 2023-05-03 DIAGNOSIS — E871 Hypo-osmolality and hyponatremia: Secondary | ICD-10-CM | POA: Diagnosis not present

## 2023-05-03 DIAGNOSIS — E1165 Type 2 diabetes mellitus with hyperglycemia: Secondary | ICD-10-CM | POA: Diagnosis not present

## 2023-05-03 DIAGNOSIS — I129 Hypertensive chronic kidney disease with stage 1 through stage 4 chronic kidney disease, or unspecified chronic kidney disease: Secondary | ICD-10-CM | POA: Diagnosis not present

## 2023-05-03 DIAGNOSIS — D631 Anemia in chronic kidney disease: Secondary | ICD-10-CM | POA: Diagnosis not present

## 2023-05-03 DIAGNOSIS — N1831 Chronic kidney disease, stage 3a: Secondary | ICD-10-CM | POA: Diagnosis not present

## 2023-05-03 DIAGNOSIS — Z7982 Long term (current) use of aspirin: Secondary | ICD-10-CM | POA: Diagnosis not present

## 2023-05-03 DIAGNOSIS — Z888 Allergy status to other drugs, medicaments and biological substances status: Secondary | ICD-10-CM | POA: Diagnosis not present

## 2023-05-03 DIAGNOSIS — K219 Gastro-esophageal reflux disease without esophagitis: Secondary | ICD-10-CM | POA: Diagnosis present

## 2023-05-03 DIAGNOSIS — Z21 Asymptomatic human immunodeficiency virus [HIV] infection status: Secondary | ICD-10-CM | POA: Diagnosis not present

## 2023-05-03 DIAGNOSIS — Z79899 Other long term (current) drug therapy: Secondary | ICD-10-CM | POA: Diagnosis not present

## 2023-05-03 DIAGNOSIS — F17210 Nicotine dependence, cigarettes, uncomplicated: Secondary | ICD-10-CM | POA: Diagnosis present

## 2023-05-03 DIAGNOSIS — E1122 Type 2 diabetes mellitus with diabetic chronic kidney disease: Secondary | ICD-10-CM | POA: Diagnosis not present

## 2023-05-03 DIAGNOSIS — J439 Emphysema, unspecified: Secondary | ICD-10-CM | POA: Diagnosis not present

## 2023-05-03 DIAGNOSIS — I214 Non-ST elevation (NSTEMI) myocardial infarction: Secondary | ICD-10-CM | POA: Diagnosis not present

## 2023-05-03 DIAGNOSIS — Z794 Long term (current) use of insulin: Secondary | ICD-10-CM | POA: Diagnosis not present

## 2023-05-03 DIAGNOSIS — J4489 Other specified chronic obstructive pulmonary disease: Secondary | ICD-10-CM | POA: Diagnosis not present

## 2023-05-03 DIAGNOSIS — R0789 Other chest pain: Secondary | ICD-10-CM | POA: Diagnosis not present

## 2023-05-03 DIAGNOSIS — Z7985 Long-term (current) use of injectable non-insulin antidiabetic drugs: Secondary | ICD-10-CM | POA: Diagnosis not present

## 2023-05-03 DIAGNOSIS — Z6831 Body mass index (BMI) 31.0-31.9, adult: Secondary | ICD-10-CM | POA: Diagnosis not present

## 2023-05-03 HISTORY — PX: LEFT HEART CATH AND CORONARY ANGIOGRAPHY: CATH118249

## 2023-05-03 LAB — CBC
HCT: 29.7 % — ABNORMAL LOW (ref 36.0–46.0)
HCT: 32.4 % — ABNORMAL LOW (ref 36.0–46.0)
Hemoglobin: 10.4 g/dL — ABNORMAL LOW (ref 12.0–15.0)
Hemoglobin: 11.1 g/dL — ABNORMAL LOW (ref 12.0–15.0)
MCH: 33.3 pg (ref 26.0–34.0)
MCH: 34.8 pg — ABNORMAL HIGH (ref 26.0–34.0)
MCHC: 35 g/dL (ref 30.0–36.0)
MCHC: 34.3 g/dL (ref 30.0–36.0)
MCV: 95.2 fL (ref 80.0–100.0)
MCV: 101.6 fL — ABNORMAL HIGH (ref 80.0–100.0)
Platelets: 208 10*3/uL (ref 150–400)
Platelets: 207 10*3/uL (ref 150–400)
RBC: 3.12 MIL/uL — ABNORMAL LOW (ref 3.87–5.11)
RBC: 3.19 MIL/uL — ABNORMAL LOW (ref 3.87–5.11)
RDW: 11.6 % (ref 11.5–15.5)
RDW: 11.9 % (ref 11.5–15.5)
WBC: 9.9 10*3/uL (ref 4.0–10.5)
WBC: 8.9 10*3/uL (ref 4.0–10.5)
nRBC: 0 % (ref 0.0–0.2)
nRBC: 0 % (ref 0.0–0.2)

## 2023-05-03 LAB — GLUCOSE, CAPILLARY
Glucose-Capillary: 185 mg/dL — ABNORMAL HIGH (ref 70–99)
Glucose-Capillary: 92 mg/dL (ref 70–99)
Glucose-Capillary: 87 mg/dL (ref 70–99)
Glucose-Capillary: 161 mg/dL — ABNORMAL HIGH (ref 70–99)

## 2023-05-03 LAB — CREATININE, SERUM
Creatinine, Ser: 1.32 mg/dL — ABNORMAL HIGH (ref 0.44–1.00)
GFR, Estimated: 52 mL/min — ABNORMAL LOW (ref >60)

## 2023-05-03 LAB — BASIC METABOLIC PANEL
Anion gap: 5 (ref 5–15)
BUN: 28 mg/dL — ABNORMAL HIGH (ref 6–20)
CO2: 22 mmol/L (ref 22–32)
Calcium: 7.9 mg/dL — ABNORMAL LOW (ref 8.9–10.3)
Chloride: 104 mmol/L (ref 98–111)
Creatinine, Ser: 1.33 mg/dL — ABNORMAL HIGH (ref 0.44–1.00)
GFR, Estimated: 52 mL/min — ABNORMAL LOW (ref >60)
Glucose, Bld: 186 mg/dL — ABNORMAL HIGH (ref 70–99)
Potassium: 4.2 mmol/L (ref 3.5–5.1)
Sodium: 131 mmol/L — ABNORMAL LOW (ref 135–145)

## 2023-05-03 LAB — LIPID PANEL
Cholesterol: 214 mg/dL — ABNORMAL HIGH (ref 0–200)
HDL: 33 mg/dL — ABNORMAL LOW (ref >40)
LDL Cholesterol: 139 mg/dL — ABNORMAL HIGH (ref 0–99)
Total CHOL/HDL Ratio: 6.5 RATIO
Triglycerides: 208 mg/dL — ABNORMAL HIGH (ref <150)
VLDL: 42 mg/dL — ABNORMAL HIGH (ref 0–40)

## 2023-05-03 LAB — TSH: TSH: 1.129 u[IU]/mL (ref 0.350–4.500)

## 2023-05-03 LAB — HEPARIN LEVEL (UNFRACTIONATED): Heparin Unfractionated: 0.18 IU/mL — ABNORMAL LOW (ref 0.30–0.70)

## 2023-05-03 SURGERY — LEFT HEART CATH AND CORONARY ANGIOGRAPHY
Anesthesia: LOCAL

## 2023-05-03 MED ORDER — SODIUM CHLORIDE 0.9 % IV SOLN: 250.0000 mL | INTRAVENOUS | Status: DC | PRN

## 2023-05-03 MED ORDER — SODIUM CHLORIDE 0.9% FLUSH: 3.0000 mL | INTRAVENOUS | Status: DC | PRN

## 2023-05-03 MED ORDER — SODIUM CHLORIDE 0.9 % WEIGHT BASED INFUSION
1.0000 mL/kg/h | INTRAVENOUS | Status: AC
  Administered 2023-05-03: 1 mL/kg/h via INTRAVENOUS

## 2023-05-03 MED ORDER — IOHEXOL 350 MG/ML SOLN
INTRAVENOUS | Status: DC | PRN
  Administered 2023-05-03: 35 mL

## 2023-05-03 MED ORDER — HEPARIN BOLUS VIA INFUSION
3000.0000 [IU] | Freq: Once | INTRAVENOUS | Status: AC
  Administered 2023-05-03: 3000 [IU] via INTRAVENOUS
  Filled 2023-05-03: qty 3000

## 2023-05-03 MED ORDER — METOPROLOL TARTRATE 12.5 MG HALF TABLET
12.5000 mg | ORAL_TABLET | Freq: Two times a day (BID) | ORAL | Status: DC
  Administered 2023-05-03 – 2023-05-04 (×3): 12.5 mg via ORAL
  Filled 2023-05-03 (×3): qty 1

## 2023-05-03 MED ORDER — VERAPAMIL HCL 2.5 MG/ML IV SOLN
INTRAVENOUS | Status: AC
  Filled 2023-05-03: qty 2

## 2023-05-03 MED ORDER — LIDOCAINE HCL (PF) 1 % IJ SOLN
INTRAMUSCULAR | Status: DC | PRN
  Administered 2023-05-03: 5 mL via INTRADERMAL

## 2023-05-03 MED ORDER — MIDAZOLAM HCL 2 MG/2ML IJ SOLN
INTRAMUSCULAR | Status: AC
  Filled 2023-05-03: qty 2

## 2023-05-03 MED ORDER — HEPARIN SODIUM (PORCINE) 1000 UNIT/ML IJ SOLN
INTRAMUSCULAR | Status: AC
  Filled 2023-05-03: qty 10

## 2023-05-03 MED ORDER — SODIUM CHLORIDE 0.9% FLUSH
3.0000 mL | Freq: Two times a day (BID) | INTRAVENOUS | Status: DC
  Administered 2023-05-03 – 2023-05-04 (×2): 3 mL via INTRAVENOUS

## 2023-05-03 MED ORDER — RANOLAZINE ER 500 MG PO TB12
500.0000 mg | ORAL_TABLET | Freq: Two times a day (BID) | ORAL | Status: DC
  Administered 2023-05-03 – 2023-05-04 (×2): 500 mg via ORAL
  Filled 2023-05-03 (×3): qty 1

## 2023-05-03 MED ORDER — FENTANYL CITRATE (PF) 100 MCG/2ML IJ SOLN
INTRAMUSCULAR | Status: AC
  Filled 2023-05-03: qty 2

## 2023-05-03 MED ORDER — CLOPIDOGREL BISULFATE 75 MG PO TABS
75.0000 mg | ORAL_TABLET | Freq: Every day | ORAL | Status: DC
  Administered 2023-05-04: 75 mg via ORAL
  Filled 2023-05-03: qty 1

## 2023-05-03 MED ORDER — FENTANYL CITRATE (PF) 100 MCG/2ML IJ SOLN
INTRAMUSCULAR | Status: DC | PRN
  Administered 2023-05-03 (×2): 25 ug via INTRAVENOUS

## 2023-05-03 MED ORDER — HEPARIN (PORCINE) IN NACL 1000-0.9 UT/500ML-% IV SOLN
INTRAVENOUS | Status: DC | PRN
  Administered 2023-05-03 (×2): 500 mL

## 2023-05-03 MED ORDER — LIDOCAINE HCL (PF) 1 % IJ SOLN
INTRAMUSCULAR | Status: AC
  Filled 2023-05-03: qty 30

## 2023-05-03 MED ORDER — HEPARIN SODIUM (PORCINE) 1000 UNIT/ML IJ SOLN
INTRAMUSCULAR | Status: DC | PRN
  Administered 2023-05-03: 4500 [IU] via INTRAVENOUS

## 2023-05-03 MED ORDER — VERAPAMIL HCL 2.5 MG/ML IV SOLN
INTRAVENOUS | Status: DC | PRN
  Administered 2023-05-03: 10 mL via INTRA_ARTERIAL

## 2023-05-03 MED ORDER — MIDAZOLAM HCL 2 MG/2ML IJ SOLN
INTRAMUSCULAR | Status: DC | PRN
  Administered 2023-05-03: 2 mg via INTRAVENOUS
  Administered 2023-05-03: 1 mg via INTRAVENOUS

## 2023-05-03 MED ORDER — HEPARIN SODIUM (PORCINE) 5000 UNIT/ML IJ SOLN
5000.0000 [IU] | Freq: Three times a day (TID) | INTRAMUSCULAR | Status: DC
  Administered 2023-05-03 – 2023-05-04 (×2): 5000 [IU] via SUBCUTANEOUS
  Filled 2023-05-03 (×2): qty 1

## 2023-05-03 SURGICAL SUPPLY — 11 items
BAND CMPR LRG ZPHR (HEMOSTASIS) ×1
BAND ZEPHYR COMPRESS 30 LONG (HEMOSTASIS) IMPLANT
CATH 5FR JL3.5 JR4 ANG PIG MP (CATHETERS) IMPLANT
GLIDESHEATH SLEND SS 6F .021 (SHEATH) IMPLANT
GUIDEWIRE INQWIRE 1.5J.035X260 (WIRE) IMPLANT
INQWIRE 1.5J .035X260CM (WIRE) ×1
KIT HEART LEFT (KITS) ×2 IMPLANT
PACK CARDIAC CATHETERIZATION (CUSTOM PROCEDURE TRAY) ×2 IMPLANT
SHEATH PROBE COVER 6X72 (BAG) IMPLANT
TRANSDUCER W/STOPCOCK (MISCELLANEOUS) ×2 IMPLANT
TUBING CIL FLEX 10 FLL-RA (TUBING) ×2 IMPLANT

## 2023-05-03 NOTE — H&P (View-Only) (Signed)
Cardiology Progress Note  Patient ID: Renee Bryant MRN: [id number] DOB: 10/10/1981 Date of Encounter: 05/03/2023  Primary Cardiologist: None  Subjective   Chief Complaint: None.   HPI: No further chest pain.  N.p.o. for left heart catheterization today.  ROS:  All other ROS reviewed and negative. Pertinent positives noted in the HPI.     Inpatient Medications  Scheduled Meds:  [START ON 05/04/2023] aspirin  81 mg Oral Daily   atorvastatin  80 mg Oral Daily   bictegravir-emtricitabine-tenofovir AF  1 tablet Oral Daily   clonazePAM  1 mg Oral BID   insulin aspart  0-20 Units Subcutaneous TID WC   insulin aspart  0-5 Units Subcutaneous QHS   insulin glargine-yfgn  20 Units Subcutaneous Daily   linagliptin  5 mg Oral Daily   metoprolol tartrate  12.5 mg Oral BID   pantoprazole  40 mg Oral BID AC   sodium chloride flush  3 mL Intravenous Q12H   sodium chloride flush  3 mL Intravenous Q12H   Continuous Infusions:  sodium chloride Stopped (05/03/23 0353)   sodium chloride     sodium chloride 1 mL/kg/hr (05/03/23 0505)   heparin 1,200 Units/hr (05/03/23 0050)   PRN Meds: sodium chloride, acetaminophen **OR** acetaminophen, aspirin-acetaminophen-caffeine, nitroGLYCERIN, ondansetron **OR** ondansetron (ZOFRAN) IV, sodium chloride flush   Vital Signs   Vitals:   05/03/23 0346 05/03/23 0350 05/03/23 0400 05/03/23 0527  BP: (!) 86/64 97/63    Pulse:  72    Resp: 20 18 18   Temp: 98.6 F (37 C)     TempSrc: Oral     SpO2: 100% 98%    Weight:    92.5 kg  Height:       No intake or output data in the 24 hours ending 05/03/23 0803    05/03/2023    5:27 AM 05/02/2023    4:27 AM 05/01/2023    8:06 PM  Last 3 Weights  Weight (lbs) 204 lb 199 lb 1.6 oz 196 lb  Weight (kg) 92.534 kg 90.311 kg 88.905 kg      Telemetry  Overnight telemetry shows sinus rhythm 60s, which I personally reviewed.   Physical Exam   Vitals:   05/03/23 0346 05/03/23 0350 05/03/23 0400 05/03/23 0527   BP: (!) 86/64 97/63    Pulse:  72    Resp: 20 18 18   Temp: 98.6 F (37 C)     TempSrc: Oral     SpO2: 100% 98%    Weight:    92.5 kg  Height:       No intake or output data in the 24 hours ending 05/03/23 0803     05/03/2023    5:27 AM 05/02/2023    4:27 AM 05/01/2023    8:06 PM  Last 3 Weights  Weight (lbs) 204 lb 199 lb 1.6 oz 196 lb  Weight (kg) 92.534 kg 90.311 kg 88.905 kg    Body mass index is 31.95 kg/m.  General: Well nourished, well developed, in no acute distress Head: Atraumatic, normal size  Eyes: PEERLA, EOMI  Neck: Supple, no JVD Endocrine: No thryomegaly Cardiac: Normal S1, S2; RRR; no murmurs, rubs, or gallops Lungs: Clear to auscultation bilaterally, no wheezing, rhonchi or rales  Abd: Soft, nontender, no hepatomegaly  Ext: No edema, pulses 2+ Musculoskeletal: No deformities, BUE and BLE strength normal and equal Skin: Warm and dry, no rashes   Neuro: Alert and oriented to person, place, time, and situation, CNII-XII grossly intact, no   focal deficits  Psych: Normal mood and affect   Labs  High Sensitivity Troponin:   Recent Labs  Lab 05/01/23 0009 05/01/23 2127 05/02/23 0711 05/02/23 1039  TROPONINIHS 113* 149* 76* 63*     Cardiac EnzymesNo results for input(s): "TROPONINI" in the last 168 hours. No results for input(s): "TROPIPOC" in the last 168 hours.  Chemistry Recent Labs  Lab 05/01/23 2127 05/02/23 1039 05/03/23 0151  NA 128*  --  131*  K 4.2  --  4.2  CL 98  --  104  CO2 22  --  22  GLUCOSE 413*  --  186*  BUN 30*  --  28*  CREATININE 1.47* 1.15* 1.33*  CALCIUM 8.1*  --  7.9*  PROT 6.4*  --   --   ALBUMIN 2.7*  --   --   AST 18  --   --   ALT 18  --   --   ALKPHOS 61  --   --   BILITOT 0.3  --   --   GFRNONAA 46* >60 52*  ANIONGAP 8  --  5    Hematology Recent Labs  Lab 05/01/23 2127 05/03/23 0151  WBC 11.4* 9.9  RBC 3.81* 3.12*  HGB 12.8 10.4*  HCT 36.0 29.7*  MCV 94.5 95.2  MCH 33.6 33.3  MCHC 35.6 35.0  RDW 11.8  11.6  PLT 247 208   BNPNo results for input(s): "BNP", "PROBNP" in the last 168 hours.  DDimer No results for input(s): "DDIMER" in the last 168 hours.   Radiology  ECHOCARDIOGRAM COMPLETE  Result Date: 05/02/2023    ECHOCARDIOGRAM REPORT   Patient Name:   Renee Bryant Date of Exam: 05/02/2023 Medical Rec #:  [id number]    Height:       67.0 in Accession #:    [id number]   Weight:       199.1 lb Date of Birth:  03/08/1981    BSA:          2.018 m Patient Age:    41 years     BP:           118/74 mmHg Patient Gender: F            HR:           72 bpm. Exam Location:  Inpatient Procedure: 2D Echo, Cardiac Doppler, Color Doppler and Intracardiac            Opacification Agent Indications:    Chest Pain  History:        Patient has no prior history of Echocardiogram examinations.                 Risk Factors:Hypertension and Diabetes.  Sonographer:    Samuel Clark Referring Phys: [id number] ALLISON L ELLIS IMPRESSIONS  1. Distal septal apical hypokinesis . Left ventricular ejection fraction, by estimation, is 45 to 50%. The left ventricle has mildly decreased function. The left ventricle demonstrates regional wall motion abnormalities (see scoring diagram/findings for  description). The left ventricular internal cavity size was mildly dilated. Left ventricular diastolic parameters were normal.  2. Right ventricular systolic function is normal. The right ventricular size is normal.  3. The mitral valve is abnormal. Trivial mitral valve regurgitation. No evidence of mitral stenosis.  4. The aortic valve was not well visualized. There is mild calcification of the aortic valve. Aortic valve regurgitation is not visualized. Aortic valve sclerosis is present, with no evidence of aortic valve stenosis.    5. The inferior vena cava is normal in size with greater than 50% respiratory variability, suggesting right atrial pressure of 3 mmHg. FINDINGS  Left Ventricle: Distal septal apical hypokinesis. Left ventricular ejection  fraction, by estimation, is 45 to 50%. The left ventricle has mildly decreased function. The left ventricle demonstrates regional wall motion abnormalities. Definity contrast agent was given IV to delineate the left ventricular endocardial borders. The left ventricular internal cavity size was mildly dilated. There is no left ventricular hypertrophy. Left ventricular diastolic parameters were normal. Right Ventricle: The right ventricular size is normal. No increase in right ventricular wall thickness. Right ventricular systolic function is normal. Left Atrium: Left atrial size was normal in size. Right Atrium: Right atrial size was normal in size. Pericardium: There is no evidence of pericardial effusion. Mitral Valve: The mitral valve is abnormal. There is mild thickening of the mitral valve leaflet(s). Trivial mitral valve regurgitation. No evidence of mitral valve stenosis. Tricuspid Valve: The tricuspid valve is normal in structure. Tricuspid valve regurgitation is not demonstrated. No evidence of tricuspid stenosis. Aortic Valve: The aortic valve was not well visualized. There is mild calcification of the aortic valve. Aortic valve regurgitation is not visualized. Aortic valve sclerosis is present, with no evidence of aortic valve stenosis. Aortic valve mean gradient measures 3.0 mmHg. Aortic valve peak gradient measures 4.6 mmHg. Aortic valve area, by VTI measures 2.78 cm. Pulmonic Valve: The pulmonic valve was normal in structure. Pulmonic valve regurgitation is not visualized. No evidence of pulmonic stenosis. Aorta: The aortic root is normal in size and structure. Venous: The inferior vena cava is normal in size with greater than 50% respiratory variability, suggesting right atrial pressure of 3 mmHg. IAS/Shunts: No atrial level shunt detected by color flow Doppler.  LEFT VENTRICLE PLAX 2D LVIDd:         4.80 cm   Diastology LVIDs:         3.40 cm   LV e' medial:    7.94 cm/s LV PW:         0.70 cm   LV  E/e' medial:  7.0 LV IVS:        0.90 cm   LV e' lateral:   9.57 cm/s LVOT diam:     2.00 cm   LV E/e' lateral: 5.8 LV SV:         58 LV SV Index:   29 LVOT Area:     3.14 cm  RIGHT VENTRICLE         IVC TAPSE (M-mode): 1.8 cm  IVC diam: 1.80 cm LEFT ATRIUM             Index        RIGHT ATRIUM          Index LA Vol (A2C):   23.8 ml 11.79 ml/m  RA Area:     7.08 cm LA Vol (A4C):   28.3 ml 14.02 ml/m  RA Volume:   11.50 ml 5.70 ml/m LA Biplane Vol: 28.1 ml 13.92 ml/m  AORTIC VALVE AV Area (Vmax):    2.55 cm AV Area (Vmean):   2.54 cm AV Area (VTI):     2.78 cm AV Vmax:           107.00 cm/s AV Vmean:          80.200 cm/s AV VTI:            0.208 m AV Peak Grad:      4.6 mmHg AV Mean   Grad:      3.0 mmHg LVOT Vmax:         87.00 cm/s LVOT Vmean:        64.900 cm/s LVOT VTI:          0.184 m LVOT/AV VTI ratio: 0.88  AORTA Ao Root diam: 3.10 cm MITRAL VALVE MV Area (PHT): 3.50 cm    SHUNTS MV Decel Time: 217 msec    Systemic VTI:  0.18 m MV E velocity: 55.50 cm/s  Systemic Diam: 2.00 cm MV A velocity: 61.80 cm/s MV E/A ratio:  0.90 Peter Nishan MD Electronically signed by Peter Nishan MD Signature Date/Time: 05/02/2023/12:05:04 PM    Final    DG Chest 2 View  Result Date: 05/01/2023 CLINICAL DATA:  Chest pain since this morning. EXAM: CHEST - 2 VIEW COMPARISON:  12/12/2022 FINDINGS: The heart size and mediastinal contours are within normal limits. Both lungs are clear. The visualized skeletal structures are unremarkable. IMPRESSION: No active cardiopulmonary disease. Electronically Signed   By: William  Stevens M.D.   On: 05/01/2023 20:30    Cardiac Studies  TTE 05/03/2023  1. Distal septal apical hypokinesis . Left ventricular ejection fraction,  by estimation, is 45 to 50%. The left ventricle has mildly decreased  function. The left ventricle demonstrates regional wall motion  abnormalities (see scoring diagram/findings for   description). The left ventricular internal cavity size was mildly  dilated.  Left ventricular diastolic parameters were normal.   2. Right ventricular systolic function is normal. The right ventricular  size is normal.   3. The mitral valve is abnormal. Trivial mitral valve regurgitation. No  evidence of mitral stenosis.   4. The aortic valve was not well visualized. There is mild calcification  of the aortic valve. Aortic valve regurgitation is not visualized. Aortic  valve sclerosis is present, with no evidence of aortic valve stenosis.   5. The inferior vena cava is normal in size with greater than 50%  respiratory variability, suggesting right atrial pressure of 3 mmHg.   Patient Profile  Guerline Bryant is a 41 y.o. female with HIV, diabetes, uterine fibroids who was admitted on 05/02/2023 for non-STEMI.  Assessment & Plan   # Non-STEMI -Admitted with chest burning elevated troponin and echocardiogram with apical septal hypokinesis.  Working diagnosis is non-STEMI. -Continue aspirin and statin therapy.  Lipids not at goal due to uncontrolled diabetes.  On heparin drip.  N.p.o. for left heart catheterization today. -Very poorly controlled diabetes.  A1c 13.1. -No signs of congestive heart failure.  Reduce metoprolol to tartrate to 12.5 mg twice daily.  Hold losartan today.  We will add back agents as we are able.  Unclear if she will tolerate Entresto or other aggressive GDMT.  -We will do what we can pending blood pressure.  Hopefully this is single-vessel CAD and LVEF and wall motion will improve with revascularization.  Shared Decision Making/Informed Consent The risks [stroke (1 in 1000), death (1 in 1000), kidney failure [usually temporary] (1 in 500), bleeding (1 in 200), allergic reaction [possibly serious] (1 in 200)], benefits (diagnostic support and management of coronary artery disease) and alternatives of a cardiac catheterization were discussed in detail with Ms. Bryant and she is willing to proceed.  # Uterine fibroids # Dysfunctional uterine  bleeding -Plan for outpatient hysterectomy at the end of the year.  No overt bleeding.  Hemoglobin stable.  Okay for Today.  No reservations for PCI if needed.  # Hyperlipidemia -Lipitor 80.  Suspect she   may benefit from Vascepa.  We will follow this as an outpatient.  # Diabetes -A1c 13.1.  Poorly controlled.  Per hospital medicine.      For questions or updates, please contact [facility] Please consult [email or web address] for contact info under        Signed, Bonanza T. O'Neal, MD, FACC [place]  [facility]  05/03/2023 8:03 AM   

## 2023-05-03 NOTE — Progress Notes (Signed)
PROGRESS NOTE Renee Bryant  ZOX:[id number] DOB: 23-Aug-1981 DOA: 05/01/2023 PCP: Verneita Griffes, NP  Brief Narrative/Hospital Course: 42 y.o.f w/ HIV well controlled, IDDM, and HTN who presented with hyperglycemia and atypical chest pain.She had a change in PCP recently, and had to switch from Humalog to Novolog which caused swelling so she stopped it (she is stable on glargine insulin),also had intolerance of GLP1 agonist.In that context, has noted sugars to be very elevated recently. Then in the last day, she was having new chest pain, not relieved with antacids or exacerbated by food.  Somewhat worse with exertion (although she is sedentary).   In the ER, ECG showed no ST changes, but new late R wave progression.  Troponin mildly elevated 113 > 169 > 70s > 60s. This was thought to be due to hyperglycemia/physiologic stress, so she was admitted on insulin and fluids.  Creatinine elevated 1.4. Seen by cardiology felt to be non-ST elevation MI echo with EF 45-50% with apical septal hypokinesia with uncontrolled diabetes ongoing tobacco abuse recommended cardiac cath 6/7.   Subjective: Patient seen and examined.  Resting comfortably No chest pain Discussed with the nursing staff -scheduled for cardiac cath lunchtime and remains NPO.   Assessment and Plan: Principal Problem:   NSTEMI (non-ST elevated myocardial infarction) (HCC) Active Problems:   Uncontrolled type 2 diabetes mellitus with hyperglycemia, with long-term current use of insulin (HCC)   HIV (human immunodeficiency virus infection) (HCC)   Essential hypertension   CKD stage 3a, GFR 45-59 ml/min (HCC)   NSTEMI: Echo apical septal hypokinesis with EF 45-50% but compensated, EKG nonischemic, troponin was elevated but downtrending.  Multiple CV risk factors with diabetes mellitus tobacco abuse.  Cardiac cath pending, continue aspirin, Lipitor 80, losartan 50, Toprol 25.  Also on heparin drip.  LDL elevated 139 HDL 33  Type 2  diabetes mellitus on long-term insulin with uncontrolled hyperglycemia: More than 400 blood sugar on admission now improved continue long-acting insulin, Tradjenta, SSI.  HbA1c poorly controlled.  Home insulin has been arranged by [facility] 18 units Lantus, SSI.  Hyperlipidemia: LDL 139 continue with statin as above  Pseudohyponatremia due to hyperglycemia  HIV, asymptomatic, last CD4 count 2400 January/9/ 2024 continue home ART  Essential hypertension BP stable continue meds as #1  CKD stage IIIA: Baseline creatinine around 1.2-1.3 Recent Labs    06/20/22 1714 07/21/22 2201 11/11/22 0142 12/12/22 0658 03/12/23 2006 05/01/23 2127 05/02/23 1039 05/03/23 0151  BUN 23* 20 22* 30* 33* 30*  --  28*  CREATININE 1.03* 1.37* 1.28* 1.12* 1.78* 1.47* 1.15* 1.33*  CO2 25 26 24 24 22 22   --  22    Anemia, likely in the setting of chronic kidney disease/uterine fibroids hemoglobin stable at 10 g.  Monitor Recent Labs  Lab 05/01/23 2127 05/03/23 0151  HGB 12.8 10.4*  HCT 36.0 29.7*    Uterine fibroids/dysfunctional uterine bleeding: Followed by GYN outpatient with plan for outpatient hysterectomy at the end of the year currently no overt bleeding.  Class I Obesity:Patient's Body mass index is 31.95 kg/m. : Will benefit with PCP follow-up, weight loss  healthy lifestyle and outpatient sleep evaluation.   DVT prophylaxis:  Code Status:   Code Status: Full Code Family Communication: plan of care discussed with patient at bedside. Patient status is:  admitted as observation but remains hospitalized for ongoing  because of nstemi Level of care: Progressive   Dispo: The patient is from: home  Anticipated disposition: home pending LHC Objective: Vitals last 24 hrs: Vitals:   05/03/23 0350 05/03/23 0400 05/03/23 0527 05/03/23 0819  BP: 97/63   109/65  Pulse: 72   70  Resp: 18 18  16   Temp:    98.6 F (37 C)  TempSrc:    Oral  SpO2: 98%   98%  Weight:   92.5 kg    Height:       Weight change: 3.629 kg  Physical Examination: General exam: alert awake, older than stated age HEENT:Oral mucosa moist, Ear/Nose WNL grossly Respiratory system: bilaterally clear BS, no use of accessory muscle Cardiovascular system: S1 & S2 +, No JVD. Gastrointestinal system: Abdomen soft,NT,ND, BS+ Nervous System:Alert, awake, moving extremities. Extremities: LE edema neg,distal peripheral pulses palpable.  Skin: No rashes,no icterus. MSK: Normal muscle bulk,tone, power  Medications reviewed:  Scheduled Meds:  [START ON 05/04/2023] aspirin  81 mg Oral Daily   atorvastatin  80 mg Oral Daily   bictegravir-emtricitabine-tenofovir AF  1 tablet Oral Daily   clonazePAM  1 mg Oral BID   insulin aspart  0-20 Units Subcutaneous TID WC   insulin aspart  0-5 Units Subcutaneous QHS   insulin glargine-yfgn  20 Units Subcutaneous Daily   linagliptin  5 mg Oral Daily   metoprolol tartrate  12.5 mg Oral BID   pantoprazole  40 mg Oral BID AC   sodium chloride flush  3 mL Intravenous Q12H   sodium chloride flush  3 mL Intravenous Q12H  Continuous Infusions:  sodium chloride Stopped (05/03/23 0353)   sodium chloride     sodium chloride 1 mL/kg/hr (05/03/23 0505)   heparin 1,200 Units/hr (05/03/23 0050)    Diet Order             Diet NPO time specified Except for: Sips with Meds  Diet effective midnight                  No intake or output data in the 24 hours ending 05/03/23 0904 Net IO Since Admission: 1,000.19 mL [05/03/23 0904]  Wt Readings from Last 3 Encounters:  05/03/23 92.5 kg  03/13/23 88.9 kg  03/12/23 88.9 kg     Unresulted Labs (From admission, onward)     Start     Ordered   05/04/23 0500  Heparin level (unfractionated)  Daily,   R     Question:  Specimen collection method  Answer:  Lab=Lab collect   05/03/23 0046   05/03/23 0730  Heparin level (unfractionated)  Once-Timed,   TIMED       Question:  Specimen collection method  Answer:  Lab=Lab  collect   05/03/23 0046   05/03/23 0500  Lipoprotein A (LPA)  Tomorrow morning,   R       Question:  Specimen collection method  Answer:  Lab=Lab collect   05/02/23 1606   05/03/23 0500  CBC  Daily,   R     Question:  Specimen collection method  Answer:  Lab=Lab collect   05/02/23 1619          Data Reviewed: I have personally reviewed following labs and imaging studies CBC: Recent Labs  Lab 05/01/23 2127 05/03/23 0151  WBC 11.4* 9.9  HGB 12.8 10.4*  HCT 36.0 29.7*  MCV 94.5 95.2  PLT 247 208   Basic Metabolic Panel: Recent Labs  Lab 05/01/23 2127 05/02/23 1039 05/03/23 0151  NA 128*  --  131*  K 4.2  --  4.2  CL  98  --  104  CO2 22  --  22  GLUCOSE 413*  --  186*  BUN 30*  --  28*  CREATININE 1.47* 1.15* 1.33*  CALCIUM 8.1*  --  7.9*   Recent Labs  Lab 05/01/23 2127  AST 18  ALT 18  ALKPHOS 61  BILITOT 0.3  PROT 6.4*  ALBUMIN 2.7*  HbA1C: Recent Labs    05/02/23 0654  HGBA1C 13.1*   CBG: Recent Labs  Lab 05/02/23 1207 05/02/23 1317 05/02/23 1625 05/02/23 2111 05/03/23 0631  GLUCAP 270* 248* 150* 152* 185*   Lipid Profile: Recent Labs    05/03/23 0151  CHOL 214*  HDL 33*  LDLCALC 139*  TRIG 208*  CHOLHDL 6.5   Thyroid Function Tests: Recent Labs    05/03/23 0151  TSH 1.129  No results found for this or any previous visit (from the past 240 hour(s)).  Antimicrobials: Anti-infectives (From admission, onward)    Start     Dose/Rate Route Frequency Ordered Stop   05/02/23 1000  bictegravir-emtricitabine-tenofovir AF (BIKTARVY) 50-200-25 MG per tablet 1 tablet        1 tablet Oral Daily 05/02/23 0718        Culture/Microbiology    Component Value Date/Time   SDES THROAT 12/05/2016 1238   SPECREQUEST NONE Reflexed from [id number] 12/05/2016 1238   CULT  12/05/2016 1238    NO GROUP A STREP (S.PYOGENES) ISOLATED Performed at [facility]    REPTSTATUS 12/07/2016 FINAL 12/05/2016 1238  Radiology Studies: ECHOCARDIOGRAM  COMPLETE  Result Date: 05/02/2023    ECHOCARDIOGRAM REPORT   Patient Name:   Renee Bryant Date of Exam: 05/02/2023 Medical Rec #:  [id number]    Height:       67.0 in Accession #:    [id number]   Weight:       199.1 lb Date of Birth:  01-Nov-1981    BSA:          2.018 m Patient Age:    41 years     BP:           118/74 mmHg Patient Gender: F            HR:           72 bpm. Exam Location:  Inpatient Procedure: 2D Echo, Cardiac Doppler, Color Doppler and Intracardiac            Opacification Agent Indications:    Chest Pain  History:        Patient has no prior history of Echocardiogram examinations.                 Risk Factors:Hypertension and Diabetes.  Sonographer:    Darlys Gales Referring Phys: [id number] ALLISON L ELLIS IMPRESSIONS  1. Distal septal apical hypokinesis . Left ventricular ejection fraction, by estimation, is 45 to 50%. The left ventricle has mildly decreased function. The left ventricle demonstrates regional wall motion abnormalities (see scoring diagram/findings for  description). The left ventricular internal cavity size was mildly dilated. Left ventricular diastolic parameters were normal.  2. Right ventricular systolic function is normal. The right ventricular size is normal.  3. The mitral valve is abnormal. Trivial mitral valve regurgitation. No evidence of mitral stenosis.  4. The aortic valve was not well visualized. There is mild calcification of the aortic valve. Aortic valve regurgitation is not visualized. Aortic valve sclerosis is present, with no evidence of aortic valve stenosis.  5. The inferior vena cava is normal  in size with greater than 50% respiratory variability, suggesting right atrial pressure of 3 mmHg. FINDINGS  Left Ventricle: Distal septal apical hypokinesis. Left ventricular ejection fraction, by estimation, is 45 to 50%. The left ventricle has mildly decreased function. The left ventricle demonstrates regional wall motion abnormalities. Definity contrast agent was given IV  to delineate the left ventricular endocardial borders. The left ventricular internal cavity size was mildly dilated. There is no left ventricular hypertrophy. Left ventricular diastolic parameters were normal. Right Ventricle: The right ventricular size is normal. No increase in right ventricular wall thickness. Right ventricular systolic function is normal. Left Atrium: Left atrial size was normal in size. Right Atrium: Right atrial size was normal in size. Pericardium: There is no evidence of pericardial effusion. Mitral Valve: The mitral valve is abnormal. There is mild thickening of the mitral valve leaflet(s). Trivial mitral valve regurgitation. No evidence of mitral valve stenosis. Tricuspid Valve: The tricuspid valve is normal in structure. Tricuspid valve regurgitation is not demonstrated. No evidence of tricuspid stenosis. Aortic Valve: The aortic valve was not well visualized. There is mild calcification of the aortic valve. Aortic valve regurgitation is not visualized. Aortic valve sclerosis is present, with no evidence of aortic valve stenosis. Aortic valve mean gradient measures 3.0 mmHg. Aortic valve peak gradient measures 4.6 mmHg. Aortic valve area, by VTI measures 2.78 cm. Pulmonic Valve: The pulmonic valve was normal in structure. Pulmonic valve regurgitation is not visualized. No evidence of pulmonic stenosis. Aorta: The aortic root is normal in size and structure. Venous: The inferior vena cava is normal in size with greater than 50% respiratory variability, suggesting right atrial pressure of 3 mmHg. IAS/Shunts: No atrial level shunt detected by color flow Doppler.  LEFT VENTRICLE PLAX 2D LVIDd:         4.80 cm   Diastology LVIDs:         3.40 cm   LV e' medial:    7.94 cm/s LV PW:         0.70 cm   LV E/e' medial:  7.0 LV IVS:        0.90 cm   LV e' lateral:   9.57 cm/s LVOT diam:     2.00 cm   LV E/e' lateral: 5.8 LV SV:         58 LV SV Index:   29 LVOT Area:     3.14 cm  RIGHT VENTRICLE          IVC TAPSE (M-mode): 1.8 cm  IVC diam: 1.80 cm LEFT ATRIUM             Index        RIGHT ATRIUM          Index LA Vol (A2C):   23.8 ml 11.79 ml/m  RA Area:     7.08 cm LA Vol (A4C):   28.3 ml 14.02 ml/m  RA Volume:   11.50 ml 5.70 ml/m LA Biplane Vol: 28.1 ml 13.92 ml/m  AORTIC VALVE AV Area (Vmax):    2.55 cm AV Area (Vmean):   2.54 cm AV Area (VTI):     2.78 cm AV Vmax:           107.00 cm/s AV Vmean:          80.200 cm/s AV VTI:            0.208 m AV Peak Grad:      4.6 mmHg AV Mean Grad:      3.0  mmHg LVOT Vmax:         87.00 cm/s LVOT Vmean:        64.900 cm/s LVOT VTI:          0.184 m LVOT/AV VTI ratio: 0.88  AORTA Ao Root diam: 3.10 cm MITRAL VALVE MV Area (PHT): 3.50 cm    SHUNTS MV Decel Time: 217 msec    Systemic VTI:  0.18 m MV E velocity: 55.50 cm/s  Systemic Diam: 2.00 cm MV A velocity: 61.80 cm/s MV E/A ratio:  0.90 Charlton Haws MD Electronically signed by Charlton Haws MD Signature Date/Time: 05/02/2023/12:05:04 PM    Final    DG Chest 2 View  Result Date: 05/01/2023 CLINICAL DATA:  Chest pain since this morning. EXAM: CHEST - 2 VIEW COMPARISON:  12/12/2022 FINDINGS: The heart size and mediastinal contours are within normal limits. Both lungs are clear. The visualized skeletal structures are unremarkable. IMPRESSION: No active cardiopulmonary disease. Electronically Signed   By: Burman Nieves M.D.   On: 05/01/2023 20:30     LOS: 0 days   Lanae Boast, MD [facility]  05/03/2023, 9:04 AM

## 2023-05-03 NOTE — Progress Notes (Signed)
ANTICOAGULATION CONSULT NOTE - Follow Up Consult  Pharmacy Consult for heparin Indication:  NSTEMI  Labs: Recent Labs    05/01/23 2127 05/02/23 0711 05/02/23 1039 05/02/23 2322  HGB 12.8  --   --   --   HCT 36.0  --   --   --   PLT 247  --   --   --   HEPARINUNFRC  --   --   --  <0.10*  CREATININE 1.47*  --  1.15*  --   TROPONINIHS 149* 76* 63*  --     Assessment: 41yo female subtherapeutic on heparin with initial dosing for CP; no infusion issues or signs of bleeding per RN.  Goal of Therapy:  Heparin level 0.3-0.7 units/ml   Plan:  3000 units heparin bolus. Increase heparin infusion by 4 units/kgABW/hr to 1200 units/hr. Check level in 6 hours.   Vernard Gambles, PharmD, BCPS 05/03/2023 12:46 AM

## 2023-05-03 NOTE — Interval H&P Note (Signed)
History and Physical Interval Note:  05/03/2023 12:52 PM  Renee Bryant  has presented today for surgery, with the diagnosis of chest pain.  The various methods of treatment have been discussed with the patient and family. After consideration of risks, benefits and other options for treatment, the patient has consented to  Procedure(s): LEFT HEART CATH AND CORONARY ANGIOGRAPHY (N/A) as a surgical intervention.  The patient's history has been reviewed, patient examined, no change in status, stable for surgery.  I have reviewed the patient's chart and labs.  Questions were answered to the patient's satisfaction.   Cath Lab Visit (complete for each Cath Lab visit)  Clinical Evaluation Leading to the Procedure:   ACS: Yes.    Non-ACS:    Anginal Classification: CCS IV  Anti-ischemic medical therapy: Minimal Therapy (1 class of medications)  Non-Invasive Test Results: No non-invasive testing performed  Prior CABG: No previous CABG        Theron Arista [facility] 05/03/2023 12:52 PM

## 2023-05-03 NOTE — Progress Notes (Signed)
Cardiology Progress Note  Patient ID: Renee Bryant MRN: [id number] DOB: Aug 13, 1981 Date of Encounter: 05/03/2023  Primary Cardiologist: None  Subjective   Chief Complaint: None.   HPI: No further chest pain.  N.p.o. for left heart catheterization today.  ROS:  All other ROS reviewed and negative. Pertinent positives noted in the HPI.     Inpatient Medications  Scheduled Meds:  [START ON 05/04/2023] aspirin  81 mg Oral Daily   atorvastatin  80 mg Oral Daily   bictegravir-emtricitabine-tenofovir AF  1 tablet Oral Daily   clonazePAM  1 mg Oral BID   insulin aspart  0-20 Units Subcutaneous TID WC   insulin aspart  0-5 Units Subcutaneous QHS   insulin glargine-yfgn  20 Units Subcutaneous Daily   linagliptin  5 mg Oral Daily   metoprolol tartrate  12.5 mg Oral BID   pantoprazole  40 mg Oral BID AC   sodium chloride flush  3 mL Intravenous Q12H   sodium chloride flush  3 mL Intravenous Q12H   Continuous Infusions:  sodium chloride Stopped (05/03/23 0353)   sodium chloride     sodium chloride 1 mL/kg/hr (05/03/23 0505)   heparin 1,200 Units/hr (05/03/23 0050)   PRN Meds: sodium chloride, acetaminophen **OR** acetaminophen, aspirin-acetaminophen-caffeine, nitroGLYCERIN, ondansetron **OR** ondansetron (ZOFRAN) IV, sodium chloride flush   Vital Signs   Vitals:   05/03/23 0346 05/03/23 0350 05/03/23 0400 05/03/23 0527  BP: (!) 86/64 97/63    Pulse:  72    Resp: 20 18 18    Temp: 98.6 F (37 C)     TempSrc: Oral     SpO2: 100% 98%    Weight:    92.5 kg  Height:       No intake or output data in the 24 hours ending 05/03/23 0803    05/03/2023    5:27 AM 05/02/2023    4:27 AM 05/01/2023    8:06 PM  Last 3 Weights  Weight (lbs) 204 lb 199 lb 1.6 oz 196 lb  Weight (kg) 92.534 kg 90.311 kg 88.905 kg      Telemetry  Overnight telemetry shows sinus rhythm 60s, which I personally reviewed.   Physical Exam   Vitals:   05/03/23 0346 05/03/23 0350 05/03/23 0400 05/03/23 0527   BP: (!) 86/64 97/63    Pulse:  72    Resp: 20 18 18    Temp: 98.6 F (37 C)     TempSrc: Oral     SpO2: 100% 98%    Weight:    92.5 kg  Height:       No intake or output data in the 24 hours ending 05/03/23 0803     05/03/2023    5:27 AM 05/02/2023    4:27 AM 05/01/2023    8:06 PM  Last 3 Weights  Weight (lbs) 204 lb 199 lb 1.6 oz 196 lb  Weight (kg) 92.534 kg 90.311 kg 88.905 kg    Body mass index is 31.95 kg/m.  General: Well nourished, well developed, in no acute distress Head: Atraumatic, normal size  Eyes: PEERLA, EOMI  Neck: Supple, no JVD Endocrine: No thryomegaly Cardiac: Normal S1, S2; RRR; no murmurs, rubs, or gallops Lungs: Clear to auscultation bilaterally, no wheezing, rhonchi or rales  Abd: Soft, nontender, no hepatomegaly  Ext: No edema, pulses 2+ Musculoskeletal: No deformities, BUE and BLE strength normal and equal Skin: Warm and dry, no rashes   Neuro: Alert and oriented to person, place, time, and situation, CNII-XII grossly intact, no  focal deficits  Psych: Normal mood and affect   Labs  High Sensitivity Troponin:   Recent Labs  Lab 05/01/23 0009 05/01/23 2127 05/02/23 0711 05/02/23 1039  TROPONINIHS 113* 149* 76* 63*     Cardiac EnzymesNo results for input(s): "TROPONINI" in the last 168 hours. No results for input(s): "TROPIPOC" in the last 168 hours.  Chemistry Recent Labs  Lab 05/01/23 2127 05/02/23 1039 05/03/23 0151  NA 128*  --  131*  K 4.2  --  4.2  CL 98  --  104  CO2 22  --  22  GLUCOSE 413*  --  186*  BUN 30*  --  28*  CREATININE 1.47* 1.15* 1.33*  CALCIUM 8.1*  --  7.9*  PROT 6.4*  --   --   ALBUMIN 2.7*  --   --   AST 18  --   --   ALT 18  --   --   ALKPHOS 61  --   --   BILITOT 0.3  --   --   GFRNONAA 46* >60 52*  ANIONGAP 8  --  5    Hematology Recent Labs  Lab 05/01/23 2127 05/03/23 0151  WBC 11.4* 9.9  RBC 3.81* 3.12*  HGB 12.8 10.4*  HCT 36.0 29.7*  MCV 94.5 95.2  MCH 33.6 33.3  MCHC 35.6 35.0  RDW 11.8  11.6  PLT 247 208   BNPNo results for input(s): "BNP", "PROBNP" in the last 168 hours.  DDimer No results for input(s): "DDIMER" in the last 168 hours.   Radiology  ECHOCARDIOGRAM COMPLETE  Result Date: 05/02/2023    ECHOCARDIOGRAM REPORT   Patient Name:   LUELLEN PORTEOUS Date of Exam: 05/02/2023 Medical Rec #:  [id number]    Height:       67.0 in Accession #:    [id number]   Weight:       199.1 lb Date of Birth:  09-02-1981    BSA:          2.018 m Patient Age:    42 years     BP:           118/74 mmHg Patient Gender: F            HR:           72 bpm. Exam Location:  Inpatient Procedure: 2D Echo, Cardiac Doppler, Color Doppler and Intracardiac            Opacification Agent Indications:    Chest Pain  History:        Patient has no prior history of Echocardiogram examinations.                 Risk Factors:Hypertension and Diabetes.  Sonographer:    Darlys Gales Referring Phys: [id number] ALLISON L ELLIS IMPRESSIONS  1. Distal septal apical hypokinesis . Left ventricular ejection fraction, by estimation, is 45 to 50%. The left ventricle has mildly decreased function. The left ventricle demonstrates regional wall motion abnormalities (see scoring diagram/findings for  description). The left ventricular internal cavity size was mildly dilated. Left ventricular diastolic parameters were normal.  2. Right ventricular systolic function is normal. The right ventricular size is normal.  3. The mitral valve is abnormal. Trivial mitral valve regurgitation. No evidence of mitral stenosis.  4. The aortic valve was not well visualized. There is mild calcification of the aortic valve. Aortic valve regurgitation is not visualized. Aortic valve sclerosis is present, with no evidence of aortic valve stenosis.  5. The inferior vena cava is normal in size with greater than 50% respiratory variability, suggesting right atrial pressure of 3 mmHg. FINDINGS  Left Ventricle: Distal septal apical hypokinesis. Left ventricular ejection  fraction, by estimation, is 45 to 50%. The left ventricle has mildly decreased function. The left ventricle demonstrates regional wall motion abnormalities. Definity contrast agent was given IV to delineate the left ventricular endocardial borders. The left ventricular internal cavity size was mildly dilated. There is no left ventricular hypertrophy. Left ventricular diastolic parameters were normal. Right Ventricle: The right ventricular size is normal. No increase in right ventricular wall thickness. Right ventricular systolic function is normal. Left Atrium: Left atrial size was normal in size. Right Atrium: Right atrial size was normal in size. Pericardium: There is no evidence of pericardial effusion. Mitral Valve: The mitral valve is abnormal. There is mild thickening of the mitral valve leaflet(s). Trivial mitral valve regurgitation. No evidence of mitral valve stenosis. Tricuspid Valve: The tricuspid valve is normal in structure. Tricuspid valve regurgitation is not demonstrated. No evidence of tricuspid stenosis. Aortic Valve: The aortic valve was not well visualized. There is mild calcification of the aortic valve. Aortic valve regurgitation is not visualized. Aortic valve sclerosis is present, with no evidence of aortic valve stenosis. Aortic valve mean gradient measures 3.0 mmHg. Aortic valve peak gradient measures 4.6 mmHg. Aortic valve area, by VTI measures 2.78 cm. Pulmonic Valve: The pulmonic valve was normal in structure. Pulmonic valve regurgitation is not visualized. No evidence of pulmonic stenosis. Aorta: The aortic root is normal in size and structure. Venous: The inferior vena cava is normal in size with greater than 50% respiratory variability, suggesting right atrial pressure of 3 mmHg. IAS/Shunts: No atrial level shunt detected by color flow Doppler.  LEFT VENTRICLE PLAX 2D LVIDd:         4.80 cm   Diastology LVIDs:         3.40 cm   LV e' medial:    7.94 cm/s LV PW:         0.70 cm   LV  E/e' medial:  7.0 LV IVS:        0.90 cm   LV e' lateral:   9.57 cm/s LVOT diam:     2.00 cm   LV E/e' lateral: 5.8 LV SV:         58 LV SV Index:   29 LVOT Area:     3.14 cm  RIGHT VENTRICLE         IVC TAPSE (M-mode): 1.8 cm  IVC diam: 1.80 cm LEFT ATRIUM             Index        RIGHT ATRIUM          Index LA Vol (A2C):   23.8 ml 11.79 ml/m  RA Area:     7.08 cm LA Vol (A4C):   28.3 ml 14.02 ml/m  RA Volume:   11.50 ml 5.70 ml/m LA Biplane Vol: 28.1 ml 13.92 ml/m  AORTIC VALVE AV Area (Vmax):    2.55 cm AV Area (Vmean):   2.54 cm AV Area (VTI):     2.78 cm AV Vmax:           107.00 cm/s AV Vmean:          80.200 cm/s AV VTI:            0.208 m AV Peak Grad:      4.6 mmHg AV Mean  Grad:      3.0 mmHg LVOT Vmax:         87.00 cm/s LVOT Vmean:        64.900 cm/s LVOT VTI:          0.184 m LVOT/AV VTI ratio: 0.88  AORTA Ao Root diam: 3.10 cm MITRAL VALVE MV Area (PHT): 3.50 cm    SHUNTS MV Decel Time: 217 msec    Systemic VTI:  0.18 m MV E velocity: 55.50 cm/s  Systemic Diam: 2.00 cm MV A velocity: 61.80 cm/s MV E/A ratio:  0.90 Charlton Haws MD Electronically signed by Charlton Haws MD Signature Date/Time: 05/02/2023/12:05:04 PM    Final    DG Chest 2 View  Result Date: 05/01/2023 CLINICAL DATA:  Chest pain since this morning. EXAM: CHEST - 2 VIEW COMPARISON:  12/12/2022 FINDINGS: The heart size and mediastinal contours are within normal limits. Both lungs are clear. The visualized skeletal structures are unremarkable. IMPRESSION: No active cardiopulmonary disease. Electronically Signed   By: Burman Nieves M.D.   On: 05/01/2023 20:30    Cardiac Studies  TTE 05/03/2023  1. Distal septal apical hypokinesis . Left ventricular ejection fraction,  by estimation, is 45 to 50%. The left ventricle has mildly decreased  function. The left ventricle demonstrates regional wall motion  abnormalities (see scoring diagram/findings for   description). The left ventricular internal cavity size was mildly  dilated.  Left ventricular diastolic parameters were normal.   2. Right ventricular systolic function is normal. The right ventricular  size is normal.   3. The mitral valve is abnormal. Trivial mitral valve regurgitation. No  evidence of mitral stenosis.   4. The aortic valve was not well visualized. There is mild calcification  of the aortic valve. Aortic valve regurgitation is not visualized. Aortic  valve sclerosis is present, with no evidence of aortic valve stenosis.   5. The inferior vena cava is normal in size with greater than 50%  respiratory variability, suggesting right atrial pressure of 3 mmHg.   Patient Profile  Renee Bryant is a 42 y.o. female with HIV, diabetes, uterine fibroids who was admitted on 05/02/2023 for non-STEMI.  Assessment & Plan   # Non-STEMI -Admitted with chest burning elevated troponin and echocardiogram with apical septal hypokinesis.  Working diagnosis is non-STEMI. -Continue aspirin and statin therapy.  Lipids not at goal due to uncontrolled diabetes.  On heparin drip.  N.p.o. for left heart catheterization today. -Very poorly controlled diabetes.  A1c 13.1. -No signs of congestive heart failure.  Reduce metoprolol to tartrate to 12.5 mg twice daily.  Hold losartan today.  We will add back agents as we are able.  Unclear if she will tolerate Entresto or other aggressive GDMT.  -We will do what we can pending blood pressure.  Hopefully this is single-vessel CAD and LVEF and wall motion will improve with revascularization.  Shared Decision Making/Informed Consent The risks [stroke (1 in 1000), death (1 in 1000), kidney failure [usually temporary] (1 in 500), bleeding (1 in 200), allergic reaction [possibly serious] (1 in 200)], benefits (diagnostic support and management of coronary artery disease) and alternatives of a cardiac catheterization were discussed in detail with Ms. Lenig and she is willing to proceed.  # Uterine fibroids # Dysfunctional uterine  bleeding -Plan for outpatient hysterectomy at the end of the year.  No overt bleeding.  Hemoglobin stable.  Okay for Today.  No reservations for PCI if needed.  # Hyperlipidemia -Lipitor 80.  Suspect she  may benefit from Vascepa.  We will follow this as an outpatient.  # Diabetes -A1c 13.1.  Poorly controlled.  Per hospital medicine.      For questions or updates, please contact [facility] Please consult [email or web address] for contact info under        Signed, Gerri Spore T. Flora Lipps, MD, [facility]  [facility]  05/03/2023 8:03 AM

## 2023-05-03 NOTE — Inpatient Diabetes Management (Signed)
Inpatient Diabetes Program Recommendations  AACE/ADA: New Consensus Statement on Inpatient Glycemic Control (2015)  Target Ranges:  Prepandial:   less than 140 mg/dL      Peak postprandial:   less than 180 mg/dL (1-2 hours)      Critically ill patients:  140 - 180 mg/dL   Lab Results  Component Value Date   GLUCAP 185 (H) 05/03/2023   HGBA1C 13.1 (H) 05/02/2023    Review of Glycemic Control  Latest Reference Range & Units 05/02/23 13:17 05/02/23 16:25 05/02/23 21:11 05/03/23 06:31 05/03/23 12:14  Glucose-Capillary 70 - 99 mg/dL 161 (H) 096 (H) 045 (H) 185 (H) 92  (H): Data is abnormally high Diabetes history: Type 2 DM Outpatient Diabetes medications: Trulicity 3 mg qwk, Glipizide 10 mg QD, Lantus 18 units QD Current orders for Inpatient glycemic control: Semglee 20 units QD, Novolog 0-20 units TID & HS, Tradjenta 5 mg QD    Inpatient Diabetes Program Recommendations:   Spoke with patient regarding outpatient diabetes management. Patient admits to frequently missing doses because, "that Lantus doesn't work".  Reviewed patient's current A1c of 13.1%. Explained what a A1c is and what it measures. Also reviewed goal A1c with patient, importance of good glucose control @ home, and blood sugar goals. Education provided to patient on patho of DM, patho of weight loss in reference to poor glycemic control, importance of improved A1C, impact from cardiac perspective, role of pancreas, survival skills, CGM, differences between short acting and long acting insulin, interventions, vascular changes and other commorbidities.  Patient has a meter and testing supplies. Provided information on CGM and patient is interested. Education on FSL3 given as well as samples. Holding on application due to procedure today. Reviewed when to reach out to MD. Admits to drinking juice and not being mindful to CHO intake. Reviewed plate method, impact of juice intake, importance of protein. Encouraged to use FSL 3 as a  tool. Plans to make an appointment for follow up with new PCP.   At discharge:  -Anticipate need for Humalog sliding scale with meals in addition to basal insulin. Thanks, Lujean Rave, MSN, RNC-OB Diabetes Coordinator [phone] (8a-5p)

## 2023-05-03 NOTE — Progress Notes (Addendum)
ANTICOAGULATION CONSULT NOTE - Follow Up Consult  Pharmacy Consult for IV Heparin Indication: chest pain/ACS  Allergies  Allergen Reactions   Metformin And Related Other (See Comments)    Flu-like symptoms   Wellbutrin [Bupropion]     unknown   Trulicity [Dulaglutide] Rash    Patient Measurements: Height: 5\' 7"  (170.2 cm) Weight: 92.5 kg (204 lb) IBW/kg (Calculated) : 61.6 Heparin Dosing Weight: 81 kg  Vital Signs: Temp: 98.6 F (37 C) (06/07 0819) Temp Source: Oral (06/07 0819) BP: 109/65 (06/07 0819) Pulse Rate: 70 (06/07 0819)  Labs: Recent Labs    05/01/23 2127 05/02/23 0711 05/02/23 1039 05/02/23 2322 05/03/23 0151 05/03/23 1000  HGB 12.8  --   --   --  10.4*  --   HCT 36.0  --   --   --  29.7*  --   PLT 247  --   --   --  208  --   HEPARINUNFRC  --   --   --  <0.10*  --  0.18*  CREATININE 1.47*  --  1.15*  --  1.33*  --   TROPONINIHS 149* 76* 63*  --   --   --      Estimated Creatinine Clearance: 65.7 mL/min (A) (by C-G formula based on SCr of 1.33 mg/dL (H)).   Medications:  Medications Prior to Admission  Medication Sig Dispense Refill Last Dose   albuterol (PROVENTIL HFA;VENTOLIN HFA) 108 (90 Base) MCG/ACT inhaler Inhale 2 puffs into the lungs every 4 (four) hours as needed for wheezing or shortness of breath. 1 Inhaler 0 Past Month   albuterol (VENTOLIN HFA) 108 (90 Base) MCG/ACT inhaler Inhale 1-2 puffs into the lungs every 6 (six) hours as needed for wheezing or shortness of breath. 1 each 0 Past Month   bictegravir-emtricitabine-tenofovir AF (BIKTARVY) 50-200-25 MG TABS tablet Take by mouth daily.   05/02/2023   clonazePAM (KLONOPIN) 0.5 MG tablet Take 1 mg by mouth 2 (two) times daily.    04/30/2023   furosemide (LASIX) 40 MG tablet Take 40 mg by mouth daily.   Past Week   insulin glargine (LANTUS) 100 UNIT/ML injection Inject 18 Units into the skin at bedtime.   05/01/2023   lansoprazole (PREVACID) 30 MG capsule Take 30 mg by mouth daily at 12 noon.    Past Week   losartan (COZAAR) 50 MG tablet Take 1 tablet (50 mg total) by mouth daily. 30 tablet 0 Past Week   pravastatin (PRAVACHOL) 40 MG tablet Take 40 mg by mouth daily.   Past Week   spironolactone (ALDACTONE) 25 MG tablet Take 25 mg by mouth daily.   Past Week   valACYclovir (VALTREX) 500 MG tablet Take 500 mg by mouth 2 (two) times daily as needed (For cold sores).   Past Month   cephALEXin (KEFLEX) 500 MG capsule Take 1 capsule (500 mg total) by mouth 4 (four) times daily. (Patient not taking: Reported on 05/02/2023) 28 capsule 0 Completed Course   ergocalciferol (VITAMIN D2) 1.25 MG (50000 UT) capsule Take 1 capsule twice a week. (Patient not taking: Reported on 05/02/2023)   Not Taking   ibuprofen (ADVIL,MOTRIN) 800 MG tablet Take 1 tablet (800 mg total) by mouth every 8 (eight) hours as needed. (Patient not taking: Reported on 05/02/2023) 21 tablet 0 Not Taking   lidocaine (LIDODERM) 5 % Place 1 patch onto the skin daily. Remove & Discard patch within 12 hours or as directed by MD (Patient not taking: Reported on  05/02/2023) 7 patch 0 Not Taking   methocarbamol (ROBAXIN) 500 MG tablet Take 1 tablet (500 mg total) by mouth every 8 (eight) hours as needed. (Patient not taking: Reported on 05/02/2023) 15 tablet 0 Not Taking   metroNIDAZOLE (FLAGYL) 500 MG tablet Take 1 tablet (500 mg total) by mouth 2 (two) times daily. (Patient not taking: Reported on 05/02/2023) 14 tablet 0 Completed Course   naproxen (NAPROSYN) 500 MG tablet Take 1 tablet (500 mg total) by mouth 2 (two) times daily. (Patient not taking: Reported on 05/02/2023) 20 tablet 0 Not Taking   oxyCODONE (ROXICODONE) 5 MG immediate release tablet Take 1 tablet (5 mg total) by mouth every 6 (six) hours as needed for severe pain. (Patient not taking: Reported on 05/02/2023) 10 tablet 0 Not Taking   phenazopyridine (PYRIDIUM) 200 MG tablet Take 1 tablet (200 mg total) by mouth 3 (three) times daily. (Patient not taking: Reported on 05/02/2023) 6 tablet 0  Not Taking   [DISCONTINUED] Dulaglutide (TRULICITY) 3 MG/0.5ML SOPN INJECT 1 PEN UNDER THE SKIN ONCE A WEEK (Patient not taking: Reported on 05/02/2023)   Not Taking    Assessment: 42 years of age female who presented with hyperglycemia and atypical chest pain. Her pain is resolved. ECHO today shows reduced EF. Cardiology on board. Pharmacy consulted for IV heparin.  Patient not on anticoagulants prior to admission.   Heparin level 0.18, subtherapeutic Current heparin infusion rate: 1200 units/hr  Hgb 10.4, Plt 208 Of note, patient is in the middle of her menstrual cycle and heavy vaginal bleeding is noted by RN. Pt has a history of heavy menstrual bleeding, but heparin is making it worse than usual.  Goal of Therapy:  Heparin level 0.3-0.7 units/ml Monitor platelets by anticoagulation protocol: Yes   Plan:  Increase heparin infusion rate to 1500 units/hr Check heparin level in 6 hours Monitor daily CBC, heparin level, and for s/sx of bleeding - specifically monitor menstrual bleeding F/u plans for [facility] s/p [facility] on 6/7   Wilburn Cornelia, PharmD, BCPS Clinical Pharmacist 05/03/2023 10:49 AM   Please refer to AMION for pharmacy phone number

## 2023-05-04 ENCOUNTER — Other Ambulatory Visit (HOSPITAL_COMMUNITY): Payer: Self-pay

## 2023-05-04 DIAGNOSIS — I214 Non-ST elevation (NSTEMI) myocardial infarction: Secondary | ICD-10-CM

## 2023-05-04 LAB — CBC
HCT: 30.8 % — ABNORMAL LOW (ref 36.0–46.0)
Hemoglobin: 11 g/dL — ABNORMAL LOW (ref 12.0–15.0)
MCH: 34.7 pg — ABNORMAL HIGH (ref 26.0–34.0)
MCHC: 35.7 g/dL (ref 30.0–36.0)
MCV: 97.2 fL (ref 80.0–100.0)
Platelets: 235 10*3/uL (ref 150–400)
RBC: 3.17 MIL/uL — ABNORMAL LOW (ref 3.87–5.11)
RDW: 11.8 % (ref 11.5–15.5)
WBC: 9.8 10*3/uL (ref 4.0–10.5)
nRBC: 0 % (ref 0.0–0.2)

## 2023-05-04 LAB — GLUCOSE, CAPILLARY: Glucose-Capillary: 220 mg/dL — ABNORMAL HIGH (ref 70–99)

## 2023-05-04 MED ORDER — RANOLAZINE ER 500 MG PO TB12: 500.0000 mg | ORAL_TABLET | Freq: Two times a day (BID) | ORAL | 0 refills | Status: DC

## 2023-05-04 MED ORDER — LOSARTAN POTASSIUM 25 MG PO TABS: 25.0000 mg | ORAL_TABLET | Freq: Every day | ORAL | 0 refills | Status: DC

## 2023-05-04 MED ORDER — LOSARTAN POTASSIUM 25 MG PO TABS: 25.0000 mg | ORAL_TABLET | Freq: Every day | ORAL | Status: DC

## 2023-05-04 MED ORDER — CLOPIDOGREL BISULFATE 75 MG PO TABS: 75.0000 mg | ORAL_TABLET | Freq: Every day | ORAL | 0 refills | Status: DC

## 2023-05-04 MED ORDER — ATORVASTATIN CALCIUM 80 MG PO TABS: 80.0000 mg | ORAL_TABLET | Freq: Every day | ORAL | 0 refills | Status: DC

## 2023-05-04 MED ORDER — METOPROLOL SUCCINATE ER 25 MG PO TB24: 25.0000 mg | ORAL_TABLET | Freq: Every day | ORAL | 0 refills | Status: DC

## 2023-05-04 MED ORDER — METOPROLOL SUCCINATE ER 25 MG PO TB24: 25.0000 mg | ORAL_TABLET | Freq: Every day | ORAL | Status: DC

## 2023-05-04 NOTE — Progress Notes (Signed)
Cardiology Progress Note  Patient ID: Renee Bryant MRN: [id number] DOB: June 03, 1981 Date of Encounter: 05/04/2023  Primary Cardiologist: None  Subjective   Chief Complaint: none.   HPI: 90% diagonal lesion that will be treated medically.  BP stable.  Right catheter site clean and dry.  Plan for discharge today.   ROS:  All other ROS reviewed and negative. Pertinent positives noted in the HPI.     Inpatient Medications  Scheduled Meds:  aspirin  81 mg Oral Daily   atorvastatin  80 mg Oral Daily   bictegravir-emtricitabine-tenofovir AF  1 tablet Oral Daily   clonazePAM  1 mg Oral BID   clopidogrel  75 mg Oral Q breakfast   heparin  5,000 Units Subcutaneous Q8H   insulin aspart  0-20 Units Subcutaneous TID WC   insulin aspart  0-5 Units Subcutaneous QHS   insulin glargine-yfgn  20 Units Subcutaneous Daily   linagliptin  5 mg Oral Daily   metoprolol tartrate  12.5 mg Oral BID   pantoprazole  40 mg Oral BID AC   ranolazine  500 mg Oral BID   sodium chloride flush  3 mL Intravenous Q12H   sodium chloride flush  3 mL Intravenous Q12H   Continuous Infusions:  sodium chloride Stopped (05/03/23 0353)   sodium chloride     PRN Meds: sodium chloride, acetaminophen **OR** acetaminophen, aspirin-acetaminophen-caffeine, nitroGLYCERIN, ondansetron **OR** ondansetron (ZOFRAN) IV, sodium chloride flush   Vital Signs   Vitals:   05/03/23 1619 05/03/23 1942 05/04/23 0624 05/04/23 0802  BP: (!) 147/89 121/73 (!) 142/92 (!) 141/91  Pulse: 72 78  78  Resp: 20 20 14 18   Temp: 98 F (36.7 C) 98.2 F (36.8 C) 98.3 F (36.8 C) 98.5 F (36.9 C)  TempSrc: Oral Oral Oral Oral  SpO2: 100% 97% 100% 99%  Weight:      Height:        Intake/Output Summary (Last 24 hours) at 05/04/2023 0931 Last data filed at 05/04/2023 4098 Gross per 24 hour  Intake 1050 ml  Output --  Net 1050 ml      05/03/2023    5:27 AM 05/02/2023    4:27 AM 05/01/2023    8:06 PM  Last 3 Weights  Weight (lbs) 204 lb 199  lb 1.6 oz 196 lb  Weight (kg) 92.534 kg 90.311 kg 88.905 kg      Telemetry  Overnight telemetry shows SR 80s, which I personally reviewed.   ECG  The most recent ECG shows SR 73 bpm, no acute changes, which I personally reviewed.   Physical Exam   Vitals:   05/03/23 1619 05/03/23 1942 05/04/23 0624 05/04/23 0802  BP: (!) 147/89 121/73 (!) 142/92 (!) 141/91  Pulse: 72 78  78  Resp: 20 20 14 18   Temp: 98 F (36.7 C) 98.2 F (36.8 C) 98.3 F (36.8 C) 98.5 F (36.9 C)  TempSrc: Oral Oral Oral Oral  SpO2: 100% 97% 100% 99%  Weight:      Height:        Intake/Output Summary (Last 24 hours) at 05/04/2023 0931 Last data filed at 05/04/2023 1191 Gross per 24 hour  Intake 1050 ml  Output --  Net 1050 ml       05/03/2023    5:27 AM 05/02/2023    4:27 AM 05/01/2023    8:06 PM  Last 3 Weights  Weight (lbs) 204 lb 199 lb 1.6 oz 196 lb  Weight (kg) 92.534 kg 90.311 kg 88.905 kg  Body mass index is 31.95 kg/m.  General: Well nourished, well developed, in no acute distress Head: Atraumatic, normal size  Eyes: PEERLA, EOMI  Neck: Supple, no JVD Endocrine: No thryomegaly Cardiac: Normal S1, S2; RRR; no murmurs, rubs, or gallops Lungs: Clear to auscultation bilaterally, no wheezing, rhonchi or rales  Abd: Soft, nontender, no hepatomegaly  Ext: No edema, pulses 2+, right radial cath site clean and dry, 2+ pulse Musculoskeletal: No deformities, BUE and BLE strength normal and equal Skin: Warm and dry, no rashes   Neuro: Alert and oriented to person, place, time, and situation, CNII-XII grossly intact, no focal deficits  Psych: Normal mood and affect   Labs  High Sensitivity Troponin:   Recent Labs  Lab 05/01/23 0009 05/01/23 2127 05/02/23 0711 05/02/23 1039  TROPONINIHS 113* 149* 76* 63*     Cardiac EnzymesNo results for input(s): "TROPONINI" in the last 168 hours. No results for input(s): "TROPIPOC" in the last 168 hours.  Chemistry Recent Labs  Lab 05/01/23 2127  05/02/23 1039 05/03/23 0151 05/03/23 1000  NA 128*  --  131*  --   K 4.2  --  4.2  --   CL 98  --  104  --   CO2 22  --  22  --   GLUCOSE 413*  --  186*  --   BUN 30*  --  28*  --   CREATININE 1.47* 1.15* 1.33* 1.32*  CALCIUM 8.1*  --  7.9*  --   PROT 6.4*  --   --   --   ALBUMIN 2.7*  --   --   --   AST 18  --   --   --   ALT 18  --   --   --   ALKPHOS 61  --   --   --   BILITOT 0.3  --   --   --   GFRNONAA 46* >60 52* 52*  ANIONGAP 8  --  5  --     Hematology Recent Labs  Lab 05/03/23 0151 05/03/23 1000 05/04/23 0151  WBC 9.9 8.9 9.8  RBC 3.12* 3.19* 3.17*  HGB 10.4* 11.1* 11.0*  HCT 29.7* 32.4* 30.8*  MCV 95.2 101.6* 97.2  MCH 33.3 34.8* 34.7*  MCHC 35.0 34.3 35.7  RDW 11.6 11.9 11.8  PLT 208 207 235   BNPNo results for input(s): "BNP", "PROBNP" in the last 168 hours.  DDimer No results for input(s): "DDIMER" in the last 168 hours.   Radiology  CARDIAC CATHETERIZATION  Result Date: 05/03/2023   Prox LAD to Mid LAD lesion is 40% stenosed.   1st Diag lesion is 90% stenosed.   LV end diastolic pressure is normal. Single vessel obstructive CAD involving the first diagonal. Normal LVEDP Plan: would recommend aggressive medical therapy and risk factor modification. Given ostial location of diagonal stenosis PCI is less than ideal since this could lead to plaque shift/impingement of the LAD. There is moderate nonobstructive disease in the mid LAD. Will continue metoprolol. Given soft BP will add Ranexa. Would add Plavix for ACS indication but this will need to be weighed against risk of bleeding with history of uterine bleeding.   ECHOCARDIOGRAM COMPLETE  Result Date: 05/02/2023    ECHOCARDIOGRAM REPORT   Patient Name:   Renee Bryant Date of Exam: 05/02/2023 Medical Rec #:  [id number]    Height:       67.0 in Accession #:    [id number]   Weight:  199.1 lb Date of Birth:  1981-03-26    BSA:          2.018 m Patient Age:    41 years     BP:           118/74 mmHg Patient  Gender: F            HR:           72 bpm. Exam Location:  Inpatient Procedure: 2D Echo, Cardiac Doppler, Color Doppler and Intracardiac            Opacification Agent Indications:    Chest Pain  History:        Patient has no prior history of Echocardiogram examinations.                 Risk Factors:Hypertension and Diabetes.  Sonographer:    Darlys Gales Referring Phys: [id number] ALLISON L ELLIS IMPRESSIONS  1. Distal septal apical hypokinesis . Left ventricular ejection fraction, by estimation, is 45 to 50%. The left ventricle has mildly decreased function. The left ventricle demonstrates regional wall motion abnormalities (see scoring diagram/findings for  description). The left ventricular internal cavity size was mildly dilated. Left ventricular diastolic parameters were normal.  2. Right ventricular systolic function is normal. The right ventricular size is normal.  3. The mitral valve is abnormal. Trivial mitral valve regurgitation. No evidence of mitral stenosis.  4. The aortic valve was not well visualized. There is mild calcification of the aortic valve. Aortic valve regurgitation is not visualized. Aortic valve sclerosis is present, with no evidence of aortic valve stenosis.  5. The inferior vena cava is normal in size with greater than 50% respiratory variability, suggesting right atrial pressure of 3 mmHg. FINDINGS  Left Ventricle: Distal septal apical hypokinesis. Left ventricular ejection fraction, by estimation, is 45 to 50%. The left ventricle has mildly decreased function. The left ventricle demonstrates regional wall motion abnormalities. Definity contrast agent was given IV to delineate the left ventricular endocardial borders. The left ventricular internal cavity size was mildly dilated. There is no left ventricular hypertrophy. Left ventricular diastolic parameters were normal. Right Ventricle: The right ventricular size is normal. No increase in right ventricular wall thickness. Right ventricular  systolic function is normal. Left Atrium: Left atrial size was normal in size. Right Atrium: Right atrial size was normal in size. Pericardium: There is no evidence of pericardial effusion. Mitral Valve: The mitral valve is abnormal. There is mild thickening of the mitral valve leaflet(s). Trivial mitral valve regurgitation. No evidence of mitral valve stenosis. Tricuspid Valve: The tricuspid valve is normal in structure. Tricuspid valve regurgitation is not demonstrated. No evidence of tricuspid stenosis. Aortic Valve: The aortic valve was not well visualized. There is mild calcification of the aortic valve. Aortic valve regurgitation is not visualized. Aortic valve sclerosis is present, with no evidence of aortic valve stenosis. Aortic valve mean gradient measures 3.0 mmHg. Aortic valve peak gradient measures 4.6 mmHg. Aortic valve area, by VTI measures 2.78 cm. Pulmonic Valve: The pulmonic valve was normal in structure. Pulmonic valve regurgitation is not visualized. No evidence of pulmonic stenosis. Aorta: The aortic root is normal in size and structure. Venous: The inferior vena cava is normal in size with greater than 50% respiratory variability, suggesting right atrial pressure of 3 mmHg. IAS/Shunts: No atrial level shunt detected by color flow Doppler.  LEFT VENTRICLE PLAX 2D LVIDd:         4.80 cm   Diastology LVIDs:  3.40 cm   LV e' medial:    7.94 cm/s LV PW:         0.70 cm   LV E/e' medial:  7.0 LV IVS:        0.90 cm   LV e' lateral:   9.57 cm/s LVOT diam:     2.00 cm   LV E/e' lateral: 5.8 LV SV:         58 LV SV Index:   29 LVOT Area:     3.14 cm  RIGHT VENTRICLE         IVC TAPSE (M-mode): 1.8 cm  IVC diam: 1.80 cm LEFT ATRIUM             Index        RIGHT ATRIUM          Index LA Vol (A2C):   23.8 ml 11.79 ml/m  RA Area:     7.08 cm LA Vol (A4C):   28.3 ml 14.02 ml/m  RA Volume:   11.50 ml 5.70 ml/m LA Biplane Vol: 28.1 ml 13.92 ml/m  AORTIC VALVE AV Area (Vmax):    2.55 cm AV Area  (Vmean):   2.54 cm AV Area (VTI):     2.78 cm AV Vmax:           107.00 cm/s AV Vmean:          80.200 cm/s AV VTI:            0.208 m AV Peak Grad:      4.6 mmHg AV Mean Grad:      3.0 mmHg LVOT Vmax:         87.00 cm/s LVOT Vmean:        64.900 cm/s LVOT VTI:          0.184 m LVOT/AV VTI ratio: 0.88  AORTA Ao Root diam: 3.10 cm MITRAL VALVE MV Area (PHT): 3.50 cm    SHUNTS MV Decel Time: 217 msec    Systemic VTI:  0.18 m MV E velocity: 55.50 cm/s  Systemic Diam: 2.00 cm MV A velocity: 61.80 cm/s MV E/A ratio:  0.90 Charlton Haws MD Electronically signed by Charlton Haws MD Signature Date/Time: 05/02/2023/12:05:04 PM    Final     Cardiac Studies   TTE 05/02/2023  1. Distal septal apical hypokinesis . Left ventricular ejection fraction,  by estimation, is 45 to 50%. The left ventricle has mildly decreased  function. The left ventricle demonstrates regional wall motion  abnormalities (see scoring diagram/findings for   description). The left ventricular internal cavity size was mildly  dilated. Left ventricular diastolic parameters were normal.   2. Right ventricular systolic function is normal. The right ventricular  size is normal.   3. The mitral valve is abnormal. Trivial mitral valve regurgitation. No  evidence of mitral stenosis.   4. The aortic valve was not well visualized. There is mild calcification  of the aortic valve. Aortic valve regurgitation is not visualized. Aortic  valve sclerosis is present, with no evidence of aortic valve stenosis.   5. The inferior vena cava is normal in size with greater than 50%  respiratory variability, suggesting right atrial pressure of 3 mmHg.   LHC 05/03/2023   Prox LAD to Mid LAD lesion is 40% stenosed.   1st Diag lesion is 90% stenosed.   LV end diastolic pressure is normal.  LHC 05/03/2023    Prox LAD to Mid LAD lesion is 40% stenosed.   1st  Diag lesion is 90% stenosed.   LV end diastolic pressure is normal.   Single vessel obstructive CAD  involving the first diagonal.  Normal LVEDP  Patient Profile  Renee Bryant is a 42 y.o. female with HIV, diabetes, uterine fibroids who was admitted on 05/02/2023 for non-STEMI.   Assessment & Plan   # Non-STEMI # Mildly reduced ejection fraction -Admitted with chest pain and elevated troponin.  EKG nonischemic.  EF 45-50% with wall motion abnormality.  Found to have a 90% diagonal lesion that we manage medically.  No intervention pursued.  Too small for intervention. -Continue aspirin and Plavix for 1 year. -Transition to metoprolol succinate 25 mg daily. -No overt symptoms of heart failure but we will start losartan 25 mg daily.  Will plan for repeat echo in 3 to 6 months.  No need for anticoagulation. --Continue with nose 84 to 500 mg twice daily. -Continue Lipitor 80 mg daily.  Patient -We will arrange outpatient follow-up in 1 to 2 weeks  [facility] will sign off.   Medication Recommendations:  As above  Other recommendations (labs, testing, etc):  none Follow up as an outpatient:  1-2 weeks in our office   For questions or updates, please contact [facility] Please consult [email or web address] for contact info under        Signed, Gerri Spore T. Flora Lipps, MD, [facility] [place]  [facility]  05/04/2023 9:31 AM

## 2023-05-04 NOTE — Progress Notes (Signed)
Pt d/c before being seen. Made referral to [facility].

## 2023-05-04 NOTE — Plan of Care (Signed)
  Problem: Education: Goal: Knowledge of General Education information will improve Description: Including pain rating scale, medication(s)/side effects and non-pharmacologic comfort measures Outcome: Adequate for Discharge   Problem: Health Behavior/Discharge Planning: Goal: Ability to manage health-related needs will improve Outcome: Adequate for Discharge   Problem: Clinical Measurements: Goal: Ability to maintain clinical measurements within normal limits will improve Outcome: Adequate for Discharge Goal: Will remain free from infection Outcome: Adequate for Discharge Goal: Diagnostic test results will improve Outcome: Adequate for Discharge Goal: Cardiovascular complication will be avoided Outcome: Adequate for Discharge   Problem: Nutrition: Goal: Adequate nutrition will be maintained Outcome: Adequate for Discharge   Problem: Elimination: Goal: Will not experience complications related to urinary retention Outcome: Adequate for Discharge   Problem: Pain Managment: Goal: General experience of comfort will improve Outcome: Adequate for Discharge   Problem: Safety: Goal: Ability to remain free from injury will improve Outcome: Adequate for Discharge   Problem: Skin Integrity: Goal: Risk for impaired skin integrity will decrease Outcome: Adequate for Discharge   Problem: Education: Goal: Ability to describe self-care measures that may prevent or decrease complications (Diabetes Survival Skills Education) will improve Outcome: Adequate for Discharge Goal: Individualized Educational Video(s) Outcome: Adequate for Discharge   Problem: Coping: Goal: Ability to adjust to condition or change in health will improve Outcome: Adequate for Discharge   Problem: Fluid Volume: Goal: Ability to maintain a balanced intake and output will improve Outcome: Adequate for Discharge   Problem: Health Behavior/Discharge Planning: Goal: Ability to identify and utilize available  resources and services will improve Outcome: Adequate for Discharge Goal: Ability to manage health-related needs will improve Outcome: Adequate for Discharge   Problem: Metabolic: Goal: Ability to maintain appropriate glucose levels will improve Outcome: Adequate for Discharge   Problem: Nutritional: Goal: Maintenance of adequate nutrition will improve Outcome: Adequate for Discharge Goal: Progress toward achieving an optimal weight will improve Outcome: Adequate for Discharge   Problem: Skin Integrity: Goal: Risk for impaired skin integrity will decrease Outcome: Adequate for Discharge   Problem: Tissue Perfusion: Goal: Adequacy of tissue perfusion will improve Outcome: Adequate for Discharge   Problem: Education: Goal: Understanding of cardiac disease, CV risk reduction, and recovery process will improve Outcome: Adequate for Discharge Goal: Individualized Educational Video(s) Outcome: Adequate for Discharge   Problem: Activity: Goal: Ability to tolerate increased activity will improve Outcome: Adequate for Discharge   Problem: Cardiac: Goal: Ability to achieve and maintain adequate cardiovascular perfusion will improve Outcome: Adequate for Discharge   Problem: Health Behavior/Discharge Planning: Goal: Ability to safely manage health-related needs after discharge will improve Outcome: Adequate for Discharge   Problem: Education: Goal: Understanding of CV disease, CV risk reduction, and recovery process will improve Outcome: Adequate for Discharge Goal: Individualized Educational Video(s) Outcome: Adequate for Discharge   Problem: Activity: Goal: Ability to return to baseline activity level will improve Outcome: Adequate for Discharge   Problem: Cardiovascular: Goal: Ability to achieve and maintain adequate cardiovascular perfusion will improve Outcome: Adequate for Discharge Goal: Vascular access site(s) Level 0-1 will be maintained Outcome: Adequate for  Discharge   Problem: Health Behavior/Discharge Planning: Goal: Ability to safely manage health-related needs after discharge will improve Outcome: Adequate for Discharge

## 2023-05-04 NOTE — Discharge Summary (Signed)
Physician Discharge Summary  Patient ID: Renee Bryant MRN: [id number] DOB/AGE: 1981/04/25 42 y.o.  Admit date: 05/01/2023 Discharge date: 05/04/2023  Admission Diagnoses:  Discharge Diagnoses:  Principal Problem:   NSTEMI (non-ST elevated myocardial infarction) [facility]) Active Problems:   HIV (human immunodeficiency virus infection) (HCC)   Essential hypertension   Uncontrolled type 2 diabetes mellitus with hyperglycemia, with long-term current use of insulin (HCC)   CKD stage 3a, GFR 45-59 ml/min (HCC)   Discharged Condition: stable  Hospital Course: 42 y.o.f w/ HIV well controlled, IDDM, and HTN who presented with hyperglycemia and atypical chest pain.She had a change in PCP recently, and had to switch from Humalog to Novolog which caused swelling so she stopped it (she is stable on glargine insulin),also had intolerance of GLP1 agonist.In that context, has noted sugars to be very elevated recently. Then in the last day, she was having new chest pain, not relieved with antacids or exacerbated by food.  Somewhat worse with exertion (although she is sedentary).   In the ER, ECG showed no ST changes, but new late R wave progression.  Troponin mildly elevated 113 > 169 > 70s > 60s. This was thought to be due to hyperglycemia/physiologic stress, so she was admitted on insulin and fluids.  Creatinine elevated 1.4. Seen by cardiology felt to be non-ST elevation MI echo with EF 45-50% with apical septal hypokinesia with uncontrolled diabetes ongoing tobacco abuse recommended cardiac cath 6/7.  Expand All Collapse All   PROGRESS NOTE Renee Bryant   WUJ:[id number] DOB: [birth year] DOA: 05/01/2023 PCP: Verneita Griffes, NP    Brief Narrative/Hospital Course: Patient is a 42 year old female with past medical history significant with well-controlled HIV, IDDM, and hypertension.  Patient was admitted with chest pain and hypoglycemia.  Apparently, patient had a change in PCP recently, and had to  switch from Humalog to Novolog which caused swelling so she stopped using NovoLog.  Patient also had intolerance to GLP1 agonist.  On presentation, ECG showed no ST changes, but new late R wave progression.  Troponin mildly elevated 113 > 169 > 70s > 60s.  Creatinine was elevated (1.4). Patient was admitted for further assessment and management.  He was seen by the cardiology team and eventually underwent cardiac catheterization.  Cardiac cath revealed 90% diagonal lesion.  The lesion was small and will be managed medically.  Cardiology team has cleared patient for discharge.  Patient will follow-up primary care provider and cardiology team on discharge.       NSTEMI:  -Echo apical septal hypokinesis with EF 45-50% but compensated, EKG nonischemic, troponin was elevated but downtrending.   -Multiple CV risk factors with diabetes mellitus tobacco abuse.   -Cardiac catheterization revealed 90% diagonal lesion.  Please see above. -Continue aspirin, Lipitor 80, losartan, Toprol 25.   -LDL elevated 139 HDL 33   Type 2 diabetes mellitus on long-term insulin with uncontrolled hyperglycemia:  -Blood sugar was greater than 400 on presentation. -Patient was managed with long-acting insulin and sliding scale insulin coverage. -HbA1c was 13.1%.    Hyperlipidemia: LDL 139 continue with statin as above   Pseudohyponatremia due to hyperglycemia   HIV, asymptomatic, last CD4 count 2400 January/9/ 2024 continue home ART   Essential hypertension BP stable continue meds as #1   CKD stage IIIA: Baseline creatinine around 1.2-1.3 Recent Labs (within last 365 days)            Recent Labs    06/20/22 1714 07/21/22 2201 11/11/22 0142 12/12/22 0658 03/12/23 2006  05/01/23 2127 05/02/23 1039 05/03/23 0151  BUN 23* 20 22* 30* 33* 30*  --  28*  CREATININE 1.03* 1.37* 1.28* 1.12* 1.78* 1.47* 1.15* 1.33*  CO2 25 26 24 24 22 22   --  22      Anemia, likely in the setting of chronic kidney disease/uterine  fibroids hemoglobin stable at 10 g.  Monitor Last Labs      Recent Labs  Lab 05/01/23 2127 05/03/23 0151  HGB 12.8 10.4*  HCT 36.0 29.7*      Uterine fibroids/dysfunctional uterine bleeding: Followed by GYN outpatient with plan for outpatient hysterectomy at the end of the year currently no overt bleeding.   Class I Obesity:Patient's Body mass index is 31.95 kg/m. : Will benefit with PCP follow-up, weight loss  healthy lifestyle and outpatient sleep evaluation.      Consults: cardiology  Significant Diagnostic Studies:  Echo reviewed: 1. Distal septal apical hypokinesis . Left ventricular ejection fraction,  by estimation, is 45 to 50%. The left ventricle has mildly decreased  function. The left ventricle demonstrates regional wall motion  abnormalities (see scoring diagram/findings for   description). The left ventricular internal cavity size was mildly  dilated. Left ventricular diastolic parameters were normal.   2. Right ventricular systolic function is normal. The right ventricular  size is normal.   3. The mitral valve is abnormal. Trivial mitral valve regurgitation. No  evidence of mitral stenosis.   4. The aortic valve was not well visualized. There is mild calcification  of the aortic valve. Aortic valve regurgitation is not visualized. Aortic  valve sclerosis is present, with no evidence of aortic valve stenosis.   5. The inferior vena cava is normal in size with greater than 50%  respiratory variability, suggesting right atrial pressure of 3 mmHg.   Discharge Exam: Blood pressure (!) 141/91, pulse 78, temperature 98.5 F (36.9 C), temperature source Oral, resp. rate 18, height 5\' 7"  (1.702 m), weight 92.5 kg, last menstrual period 05/01/2023, SpO2 99 %.   Disposition: Discharge disposition: 01-Home or Self Care       Discharge Instructions     AMB referral to Phase II Cardiac Rehabilitation   Complete by: As directed    Diagnosis: NSTEMI   After  initial evaluation and assessments completed: Virtual Based Care may be provided alone or in conjunction with Phase 2 Cardiac Rehab based on patient barriers.: Yes   Intensive Cardiac Rehabilitation (ICR) [facility] location only OR Traditional Cardiac Rehabilitation (TCR) *If criteria for ICR are not met will enroll in TCR [facility] only): Yes   Diet - low sodium heart healthy   Complete by: As directed    Increase activity slowly   Complete by: As directed    Increase activity slowly   Complete by: As directed       Allergies as of 05/04/2023       Reactions   Metformin And Related Other (See Comments)   Flu-like symptoms   Wellbutrin [bupropion]    unknown   Trulicity [dulaglutide] Rash        Medication List     STOP taking these medications    cephALEXin 500 MG capsule Commonly known as: KEFLEX   ibuprofen 800 MG tablet Commonly known as: ADVIL   lansoprazole 30 MG capsule Commonly known as: PREVACID   lidocaine 5 % Commonly known as: Lidoderm   naproxen 500 MG tablet Commonly known as: NAPROSYN   phenazopyridine 200 MG tablet Commonly known as: PYRIDIUM   pravastatin  40 MG tablet Commonly known as: PRAVACHOL   spironolactone 25 MG tablet Commonly known as: ALDACTONE   Trulicity 3 MG/0.5ML Sopn Generic drug: Dulaglutide       TAKE these medications    albuterol 108 (90 Base) MCG/ACT inhaler Commonly known as: VENTOLIN HFA Inhale 1-2 puffs into the lungs every 6 (six) hours as needed for wheezing or shortness of breath. What changed: Another medication with the same name was removed. Continue taking this medication, and follow the directions you see here.   aspirin 81 MG chewable tablet Chew 1 tablet (81 mg total) by mouth daily.   atorvastatin 80 MG tablet Commonly known as: LIPITOR Take 1 tablet (80 mg total) by mouth daily. What changed:  medication strength how much to take when to take this   bictegravir-emtricitabine-tenofovir AF 50-200-25 MG  Tabs tablet Commonly known as: BIKTARVY Take by mouth daily.   clonazePAM 0.5 MG tablet Commonly known as: KLONOPIN Take 1 mg by mouth 2 (two) times daily.   clopidogrel 75 MG tablet Commonly known as: PLAVIX Take 1 tablet (75 mg total) by mouth daily with breakfast. Start taking on: May 05, 2023   ergocalciferol 1.25 MG (50000 UT) capsule Commonly known as: VITAMIN D2 Take 1 capsule twice a week.   furosemide 40 MG tablet Commonly known as: LASIX Take 40 mg by mouth daily.   HumaLOG KwikPen 100 UNIT/ML KwikPen Generic drug: insulin lispro Inject 0-11 Units into the skin 3 (three) times daily with meals based on sliding scale: Blood Glucose <150= 0 unit; BG 150-200= 1 unit; BG 201-250= 3 unit; BG 251-300= 5 unit; BG 301-350= 7 unit; BG 351-400= 9 unit; BG >400= 11 unit and Call Primary Care.   insulin glargine 100 UNIT/ML injection Commonly known as: LANTUS Inject 18 Units into the skin at bedtime.   losartan 25 MG tablet Commonly known as: COZAAR Take 1 tablet (25 mg total) by mouth daily. What changed:  medication strength how much to take   methocarbamol 500 MG tablet Commonly known as: ROBAXIN Take 1 tablet (500 mg total) by mouth every 8 (eight) hours as needed.   metoprolol succinate 25 MG 24 hr tablet Commonly known as: TOPROL-XL Take 1 tablet (25 mg total) by mouth daily.   metroNIDAZOLE 500 MG tablet Commonly known as: FLAGYL Take 1 tablet (500 mg total) by mouth 2 (two) times daily.   oxyCODONE 5 MG immediate release tablet Commonly known as: Roxicodone Take 1 tablet (5 mg total) by mouth every 6 (six) hours as needed for severe pain.   ranolazine 500 MG 12 hr tablet Commonly known as: RANEXA Take 1 tablet (500 mg total) by mouth 2 (two) times daily.   TechLite Plus Pen Needles 32G X 4 MM Misc Generic drug: Insulin Pen Needle Use three times daily with Humalog kwikpen and with glargine   valACYclovir 500 MG tablet Commonly known as: VALTREX Take  500 mg by mouth 2 (two) times daily as needed (For cold sores).        Follow-up Information     Hairston, Valda Lamb, NP Follow up.   Specialty: Nurse Practitioner Contact information: [facility] [place] [address] [phone]         Eather Colas, FNP. Schedule an appointment as soon as possible for a visit.   Specialty: Nurse Practitioner Contact information: [address] [place] [address] [phone]         Carlos Levering, NP Follow up on  05/13/2023.   Specialty: Cardiology Why: 2:45PM. Cardiology follow up Contact information: [address] [place] [address] [phone]                 Time spent: 35 minutes.   SignedBarnetta Chapel 05/04/2023, 10:21 AM

## 2023-05-06 ENCOUNTER — Telehealth (HOSPITAL_COMMUNITY): Payer: Self-pay | Admitting: *Deleted

## 2023-05-06 ENCOUNTER — Encounter (HOSPITAL_COMMUNITY): Payer: Self-pay | Admitting: Cardiology

## 2023-05-06 LAB — LIPOPROTEIN A (LPA): Lipoprotein (a): 16.6 nmol/L (ref <75.0)

## 2023-05-06 NOTE — Telephone Encounter (Signed)
CARDIAC REHAB PHASE I    Spoke with pt via telephone. Post MI education including site care, restrictions, NTG use, exercise guidelines, risk factors, smoking cessation, MI booklet, heart healthy diabetic diet and CRP2 reviewed. All questions and concerns addressed. Will refer to [facility] per patient preference. Will send written educational materials to address on file.   Woodroe Chen, RN BSN 05/06/2023 2:26 PM

## 2023-05-07 ENCOUNTER — Telehealth: Payer: Self-pay | Admitting: Cardiovascular Disease

## 2023-05-07 NOTE — Telephone Encounter (Signed)
Pt states that her right arm hurts to move her arm; hurts to straighten it out; it was sore yesterday, but today it is worse.  She reports that it is a little puffy between the forearm and elbow. Just a small area that is a little puffy.  The pain is between her forearm and elbow- on the underside. She states that there is no redness. Also, Thursday, took Iv out of arm before she had the procedure due to "swelling." It was located on top of her arm/forearm area (opposite side).   She Can feel a radial and an ulnar pulse. Told her that she can call her PCP and get it checked out with them. Told her to monitor it. If the swelling or pain increases then she needs to be seen. Also told her that if she sees any redness, then she needs to be seen.  Told her call with any further questions or concerns. She verbalized understanding.

## 2023-05-07 NOTE — Telephone Encounter (Signed)
Pt called stating her arm is in a lot of pain after having procedure done and she'd like a callback to know if that's normal. She stated she's not able to use it because its so painful. Please advise

## 2023-05-12 NOTE — Progress Notes (Unsigned)
Cardiology Clinic Note   Date: 05/13/2023 ID: Dama Boggan, DOB 1981-07-29, MRN [id number]  Primary Cardiologist:  Reatha Harps, MD  Patient Profile    Lorrinda Mazer is a 42 y.o. female who presents to the clinic today for hospital follow up.     Past medical history significant for: CAD. LHC 05/03/2023 (NSTEMI): Proximal to mid LAD 40%.  D1 90%.  Recommend aggressive medical therapy and risk factor modification.  Given ostial location of diagonal stenosis PCI is less than ideal since this could lead to plaque shift/impingement of the LAD.  Ranexa added secondary to soft BP. HFmrEF. Echo 05/02/2023: Distal septal apical hypokinesis.  EF 45 to 50%.  RWMA.  Normal RV function.  Trivial MR.  Aortic valve sclerosis without stenosis. Hypertension. Hyperlipidemia. Lipid panel 05/03/2023: LDL 139, HDL 33, TG 208, total 214. LPa 05/03/2023: 16.6. IDDM. CKD stage IIIa. HIV. Dysfunctional uterine bleeding. Emphysema. Tobacco abuse.     History of Present Illness    Pricella Voskuil was first evaluated by Dr. Flora Lipps on 05/02/2023 for NSTEMI.  Patient presented to the ED with a 1 day history of chest discomfort described as burning radiating to her left arm with associated nausea and vomiting as well as shortness of breath.  Pepcid partially relieved symptoms.  Upon arrival to [facility] she was noted to be significantly hyperglycemic.  Troponin 113>> 146.  She was transferred to [facility].  She underwent LHC which showed proximal to mid LAD 40%, D1 90% not amenable to PCI secondary to concern for plaque shift/impingement of the LAD.  Aggressive medical therapy and risk factor modification was recommended.  Patient contacted triage on 05/07/2023 with complaints of right arm pain post cath: "Pt states that her right arm hurts to move her arm; hurts to straighten it out; it was sore yesterday, but today it is worse. She reports that it is a little puffy between the forearm and elbow. Just  a small area that is a little puffy. The pain is between her forearm and elbow- on the underside. She states that there is no redness. Also, Thursday, took Iv out of arm before she had the procedure due to "swelling." It was located on top of her arm/forearm area (opposite side). She Can feel a radial and an ulnar pulse. Told her that she can call her PCP and get it checked out with them. Told her to monitor it. If the swelling or pain increases then she needs to be seen. Also told her that if she sees any redness, then she needs to be seen."  Today, patient is here alone. She is doing well since hospital discharge. She had a lot of right arm pain after discharge (see above). It has since resolved. She denies any further episodes of chest pain. She has chronic lower extremity edema and was taking daily lasix prior to hospital admission. It was stopped on discharge but she is not sure why. She has been working on cutting down on caffeine. She has also reduced her sodium intake. She is a heavy smoker (1 to 1 1/2 packs a day since age 58) and is very interested in quitting.    ROS: All other systems reviewed and are otherwise negative except as noted in History of Present Illness.  Studies Reviewed    ECG is not ordered today.       Physical Exam    VS:  BP 128/81   Pulse 72   Ht 5\' 7"  (1.702  m)   Wt 197 lb 3.2 oz (89.4 kg)   LMP 05/01/2023   SpO2 97%   BMI 30.89 kg/m  , BMI Body mass index is 30.89 kg/m.  GEN: Well nourished, well developed, in no acute distress. Neck: No JVD or carotid bruits. Cardiac: RRR. No murmurs. No rubs or gallops.   Respiratory:  Respirations regular and unlabored. Breath sounds course in upper lobes and diminished in lower bases bilaterally without rales, wheezing or rhonchi. GI: Soft, nontender, nondistended. Extremities: Radials/DP/PT 2+ and equal bilaterally. No clubbing or cyanosis. No edema.   Skin: Warm and dry, no rash. Neuro: Strength  intact.  Assessment & Plan    CAD.  LHC June 2024 showed proximal to mid LAD 40%, D1 90% not amenable to PCI secondary to concern for plaque shift/impingement of the LAD.  Patient denies further episodes of chest pain. Continue atorvastatin, aspirin, Plavix, metoprolol, Ranexa. HFmrEF.  Echo June 2024 showed EF 45 to 50% with normal RV function.  Patient reports chronic lower extremity edema managed with Lasix. She has been off it since hospital discharge and would like to return to taking it. She has trace edema bilateral lower extremities today.  Euvolemic and well compensated on exam. Breath sounds are course in upper lobes and diminished in lower bases bilaterally.  Continue losartan, metoprolol. She is instructed to weight daily. Will get a BMP today to check on kidney function before resuming Lasix. Provided Salty 6 information sheet.  Right arm pain post cath. Patient contacted triage on 05/07/2023 with complaints of right arm pain and mild swelling. She reported her IV was taken out of her right forearm due to "swelling." She reports pain is resolved today. 2+ radial pulse. Evidence of 3 puncture sits on radial wrist. Healing ecchymosis on ulnar side. No edema, erythema or drainage. Instructed patient if she ever needed a catheterization again she should mention that she experienced this arm pain post cath.  Hypertension. BP today 128/81. Patient denies headaches, dizziness or vision changes. Continue losartan, metoprolol. Hyperlipidemia.  LDL June 2024 139, not at goal.  Continue atorvastatin.  Plan for repeat lipid panel and LFTs in 8 to 10 weeks. Tobacco abuse. Patient continues to smoke 1 to 1 1/12 packs of cigarettes a day. She is very interested in quitting. Will provide starter pack of Chantix for patient. She is going to find a new PCP to continue to manage Chantix.   Disposition: BMP today. If kidney function and electrolytes stable may resume Lasix. Will have patient try taking 20 mg  daily to see if it can manage edema since she is now restricting sodium. Chantix starter pack. Salty 6 information sheet provided. Return in 3 months or sooner as needed.          Signed, Etta Grandchild. Zunairah Devers, DNP, NP-C

## 2023-05-13 ENCOUNTER — Encounter: Payer: Self-pay | Admitting: Student

## 2023-05-13 ENCOUNTER — Telehealth (HOSPITAL_COMMUNITY): Payer: Self-pay

## 2023-05-13 ENCOUNTER — Ambulatory Visit: Payer: Medicaid Other | Attending: Student | Admitting: Student

## 2023-05-13 VITALS — BP 128/81 | HR 72 | Ht 67.0 in | Wt 197.2 lb

## 2023-05-13 DIAGNOSIS — I10 Essential (primary) hypertension: Secondary | ICD-10-CM

## 2023-05-13 DIAGNOSIS — M79601 Pain in right arm: Secondary | ICD-10-CM | POA: Diagnosis not present

## 2023-05-13 DIAGNOSIS — I5022 Chronic systolic (congestive) heart failure: Secondary | ICD-10-CM

## 2023-05-13 DIAGNOSIS — Z79899 Other long term (current) drug therapy: Secondary | ICD-10-CM

## 2023-05-13 DIAGNOSIS — I2510 Atherosclerotic heart disease of native coronary artery without angina pectoris: Secondary | ICD-10-CM

## 2023-05-13 DIAGNOSIS — E785 Hyperlipidemia, unspecified: Secondary | ICD-10-CM | POA: Diagnosis not present

## 2023-05-13 DIAGNOSIS — Z720 Tobacco use: Secondary | ICD-10-CM

## 2023-05-13 MED ORDER — VARENICLINE TARTRATE (STARTER) 0.5 MG X 11 & 1 MG X 42 PO TBPK: ORAL_TABLET | ORAL | 0 refills | Status: DC

## 2023-05-13 NOTE — Patient Instructions (Addendum)
Medication Instructions:   START Chantix starter pack as directed  *If you need a refill on your cardiac medications before your next appointment, please call your pharmacy*  Lab Work: Renee Levering, NP recommends that you have lab work TODAY:  BMP  If you have labs (blood work) drawn today and your tests are completely normal, you will receive your results only by: MyChart Message (if you have MyChart) OR A paper copy in the mail If you have any lab test that is abnormal or we need to change your treatment, we will call you to review the results.  Testing/Procedures: NONE ordered at this time of appointment   Follow-Up: At [facility], you and your health needs are our priority.  As part of our continuing mission to provide you with exceptional heart care, we have created designated Provider Care Teams.  These Care Teams include your primary Cardiologist (physician) and Advanced Practice Providers (APPs -  Physician Assistants and Nurse Practitioners) who all work together to provide you with the care you need, when you need it.  We recommend signing up for the patient portal called "MyChart".  Sign up information is provided on this After Visit Summary.  MyChart is used to connect with patients for Virtual Visits (Telemedicine).  Patients are able to view lab/test results, encounter notes, upcoming appointments, etc.  Non-urgent messages can be sent to your provider as well.   To learn more about what you can do with MyChart, go to [email or web address].    Your next appointment:   3 month(s)  Provider:   Reatha Harps, MD     Other Instructions Weigh yourself EVERY morning after you go to the bathroom but before you eat or drink anything. Write this number down in a weight log/diary. If you gain 3 pounds overnight or 5 pounds in a week, call the office. Take your medicines as prescribed. If you have concerns about your medications, please call us before you stop  taking them.  Eat low salt foods--Limit salt (sodium) to 2000 mg per day. This will help prevent your body from holding onto fluid. Read food labels as many processed foods have a lot of sodium, especially canned goods and prepackaged meats. If you would like some assistance choosing low sodium foods, we would be happy to set you up with a nutritionist. Stay as active as you can everyday. Staying active will give you more energy and make your muscles stronger. Start with 5 minutes at a time and work your way up to 30 minutes a day. Break up your activities--do some in the morning and some in the afternoon. Start with 3 days per week and work your way up to 5 days as you can.  If you have chest pain, feel short of breath, dizzy, or lightheaded, STOP. If you don't feel better after a short rest, call 911. If you do feel better, call the office to let us know you have symptoms with exercise.       Low-Sodium Eating Plan  Salt (sodium) helps you keep a healthy balance of fluids in your body. Too much sodium can raise your blood pressure. It can also cause fluid and waste to be held in your body. Your health care provider or dietitian may recommend a low-sodium eating plan if you have high blood pressure (hypertension), kidney disease, liver disease, or heart failure. Eating less sodium can help lower your blood pressure and reduce swelling. It can also protect your heart, liver,  and kidneys. What are tips for following this plan? Reading food labels  Check food labels for the amount of sodium per serving. If you eat more than one serving, you must multiply the listed amount by the number of servings. Choose foods with less than 140 milligrams (mg) of sodium per serving. Avoid foods with 300 mg of sodium or more per serving. Always check how much sodium is in a product, even if the label says "unsalted" or "no salt added." Shopping  Buy products labeled as "low-sodium" or "no salt added." Buy fresh  foods. Avoid canned foods and pre-made or frozen meals. Avoid canned, cured, or processed meats. Buy breads that have less than 80 mg of sodium per slice. Cooking  Eat more home-cooked food. Try to eat less restaurant, buffet, and fast food. Try not to add salt when you cook. Use salt-free seasonings or herbs instead of table salt or sea salt. Check with your provider or pharmacist before using salt substitutes. Cook with plant-based oils, such as canola, sunflower, or olive oil. Meal planning When eating at a restaurant, ask if your food can be made with less salt or no salt. Avoid dishes labeled as brined, pickled, cured, or smoked. Avoid dishes made with soy sauce, miso, or teriyaki sauce. Avoid foods that have monosodium glutamate (MSG) in them. MSG may be added to some restaurant food, sauces, soups, bouillon, and canned foods. Make meals that can be grilled, baked, poached, roasted, or steamed. These are often made with less sodium. General information Try to limit your sodium intake to 1,500-2,300 mg each day, or the amount told by your provider. What foods should I eat? Fruits Fresh, frozen, or canned fruit. Fruit juice. Vegetables Fresh or frozen vegetables. "No salt added" canned vegetables. "No salt added" tomato sauce and paste. Low-sodium or reduced-sodium tomato and vegetable juice. Grains Low-sodium cereals, such as oats, puffed wheat and rice, and shredded wheat. Low-sodium crackers. Unsalted rice. Unsalted pasta. Low-sodium bread. Whole grain breads and whole grain pasta. Meats and other proteins Fresh or frozen meat, poultry, seafood, and fish. These should have no added salt. Low-sodium canned tuna and salmon. Unsalted nuts. Dried peas, beans, and lentils without added salt. Unsalted canned beans. Eggs. Unsalted nut butters. Dairy Milk. Soy milk. Cheese that is naturally low in sodium, such as ricotta cheese, fresh mozzarella, or Swiss cheese. Low-sodium or reduced-sodium  cheese. Cream cheese. Yogurt. Seasonings and condiments Fresh and dried herbs and spices. Salt-free seasonings. Low-sodium mustard and ketchup. Sodium-free salad dressing. Sodium-free light mayonnaise. Fresh or refrigerated horseradish. Lemon juice. Vinegar. Other foods Homemade, reduced-sodium, or low-sodium soups. Unsalted popcorn and pretzels. Low-salt or salt-free chips. The items listed above may not be all the foods and drinks you can have. Talk to a dietitian to learn more. What foods should I avoid? Vegetables Sauerkraut, pickled vegetables, and relishes. Olives. [place] fries. Onion rings. Regular canned vegetables, except low-sodium or reduced-sodium items. Regular canned tomato sauce and paste. Regular tomato and vegetable juice. Frozen vegetables in sauces. Grains Instant hot cereals. Bread stuffing, pancake, and biscuit mixes. Croutons. Seasoned rice or pasta mixes. Noodle soup cups. Boxed or frozen macaroni and cheese. Regular salted crackers. Self-rising flour. Meats and other proteins Meat or fish that is salted, canned, smoked, spiced, or pickled. Precooked or cured meat, such as sausages or meat loaves. Tomasa Blase. Ham. Pepperoni. Hot dogs. Corned beef. Chipped beef. Salt pork. Jerky. Pickled herring, anchovies, and sardines. Regular canned tuna. Salted nuts. Dairy Processed cheese and cheese spreads.  Hard cheeses. Cheese curds. Blue cheese. Feta cheese. String cheese. Regular cottage cheese. Buttermilk. Canned milk. Fats and oils Salted butter. Regular margarine. Ghee. Bacon fat. Seasonings and condiments Onion salt, garlic salt, seasoned salt, table salt, and sea salt. Canned and packaged gravies. Worcestershire sauce. Tartar sauce. Barbecue sauce. Teriyaki sauce. Soy sauce, including reduced-sodium soy sauce. Steak sauce. Fish sauce. Oyster sauce. Cocktail sauce. Horseradish that you find on the shelf. Regular ketchup and mustard. Meat flavorings and tenderizers. Bouillon cubes. Hot  sauce. Pre-made or packaged marinades. Pre-made or packaged taco seasonings. Relishes. Regular salad dressings. Salsa. Other foods Salted popcorn and pretzels. Corn chips and puffs. Potato and tortilla chips. Canned or dried soups. Pizza. Frozen entrees and pot pies. The items listed above may not be all the foods and drinks you should avoid. Talk to a dietitian to learn more. This information is not intended to replace advice given to you by your health care provider. Make sure you discuss any questions you have with your health care provider. Document Revised: 11/29/2022 Document Reviewed: 11/29/2022 Elsevier Patient Education  2024 [employer].

## 2023-05-13 NOTE — Telephone Encounter (Signed)
Per Phase 1 Cardiac rehab fax referral to [place]. 

## 2023-05-14 LAB — BASIC METABOLIC PANEL
BUN/Creatinine Ratio: 16 (ref 9–23)
BUN: 34 mg/dL — ABNORMAL HIGH (ref 6–24)
CO2: 27 mmol/L (ref 20–29)
Calcium: 9.1 mg/dL (ref 8.7–10.2)
Chloride: 99 mmol/L (ref 96–106)
Creatinine, Ser: 2.07 mg/dL — ABNORMAL HIGH (ref 0.57–1.00)
Glucose: 231 mg/dL — ABNORMAL HIGH (ref 70–99)
Potassium: 4.7 mmol/L (ref 3.5–5.2)
Sodium: 137 mmol/L (ref 134–144)
eGFR: 31 mL/min/{1.73_m2} — ABNORMAL LOW (ref >59)

## 2023-05-15 ENCOUNTER — Telehealth: Payer: Self-pay | Admitting: Emergency Medicine

## 2023-05-15 ENCOUNTER — Other Ambulatory Visit: Payer: Self-pay | Admitting: Emergency Medicine

## 2023-05-15 DIAGNOSIS — N1831 Chronic kidney disease, stage 3a: Secondary | ICD-10-CM

## 2023-05-15 NOTE — Telephone Encounter (Signed)
Called this pt. No answer, left message to call back and told her that I will also send all this information in a Mychart message  Order placed for BMP, Losartan taken off medication list.   Pt needs to stop Losartan, Come in for BMP on Friday, 05/17/23; also needs f/u appt scheduled for 6/27 or 6/28.  Mychart message sent to the patient- result management Per Carlos Levering, NP  Your kidney function is worse than when you were in the hospital. Please stop losartan and come in for repeat BMP on Friday. We also need to schedule you for a follow up appointment for next Thursday 6/27 or Friday 6/28. Please let us know which day works best for you and if you prefer morning or afternoon.  Please return for Blood Work on Friday 05/17/23. No appointment needed, lab here at the office is open Monday-Friday from 8AM to 4PM.

## 2023-05-17 NOTE — Telephone Encounter (Signed)
Thank you so much

## 2023-05-17 NOTE — Telephone Encounter (Signed)
Called the patient; she had not read the MyChart message. Informed her of all of the information from previous message. She will discontinue her Losartan; she is no longer taking Lasix. It is too late to get BMET today. Pt will come to the [facility] office on Monday 05/20/23 for lab work. Follow up Appt scheduled for 05/24/23 at 1445.

## 2023-05-20 ENCOUNTER — Other Ambulatory Visit: Payer: Self-pay

## 2023-05-20 DIAGNOSIS — N1831 Chronic kidney disease, stage 3a: Secondary | ICD-10-CM

## 2023-05-21 LAB — BASIC METABOLIC PANEL
BUN/Creatinine Ratio: 15 (ref 9–23)
BUN: 22 mg/dL (ref 6–24)
CO2: 24 mmol/L (ref 20–29)
Calcium: 9.3 mg/dL (ref 8.7–10.2)
Chloride: 102 mmol/L (ref 96–106)
Creatinine, Ser: 1.45 mg/dL — ABNORMAL HIGH (ref 0.57–1.00)
Glucose: 211 mg/dL — ABNORMAL HIGH (ref 70–99)
Potassium: 4.3 mmol/L (ref 3.5–5.2)
Sodium: 137 mmol/L (ref 134–144)
eGFR: 47 mL/min/{1.73_m2} — ABNORMAL LOW (ref >59)

## 2023-05-24 ENCOUNTER — Ambulatory Visit: Payer: Medicaid Other | Attending: Student | Admitting: General Practice

## 2023-05-24 ENCOUNTER — Encounter: Payer: Self-pay | Admitting: General Practice

## 2023-05-24 VITALS — BP 132/74 | HR 77 | Ht 67.0 in | Wt 205.0 lb

## 2023-05-24 DIAGNOSIS — E785 Hyperlipidemia, unspecified: Secondary | ICD-10-CM | POA: Diagnosis not present

## 2023-05-24 DIAGNOSIS — I2510 Atherosclerotic heart disease of native coronary artery without angina pectoris: Secondary | ICD-10-CM

## 2023-05-24 DIAGNOSIS — Z79899 Other long term (current) drug therapy: Secondary | ICD-10-CM

## 2023-05-24 DIAGNOSIS — I5022 Chronic systolic (congestive) heart failure: Secondary | ICD-10-CM

## 2023-05-24 MED ORDER — FUROSEMIDE 20 MG PO TABS: 20.0000 mg | ORAL_TABLET | ORAL | 3 refills | Status: DC

## 2023-05-24 NOTE — Progress Notes (Signed)
Cardiology Clinic Note   Date: 05/24/2023 ID: Renee Bryant, DOB 11/04/1981, MRN [id number]  Primary Cardiologist:  Reatha Harps, MD  Patient Profile    Renee Bryant is a 42 y.o. female who presents to the clinic today for her coronary artery disease and moderately reduced ejection fraction.    Past medical history significant for: CAD. LHC 05/03/2023 (NSTEMI): Proximal to mid LAD 40%.  D1 90%.  Recommend aggressive medical therapy and risk factor modification.  Given ostial location of diagonal stenosis PCI is less than ideal since this could lead to plaque shift/impingement of the LAD.  Ranexa added secondary to soft BP. HFmrEF. Echo 05/02/2023: Distal septal apical hypokinesis.  EF 45 to 50%.  RWMA.  Normal RV function.  Trivial MR.  Aortic valve sclerosis without stenosis. Hypertension. Hyperlipidemia. Lipid panel 05/03/2023: LDL 139, HDL 33, TG 208, total 214. LPa 05/03/2023: 16.6. IDDM. CKD stage IIIa. HIV. Dysfunctional uterine bleeding. Emphysema. Tobacco abuse.     History of Present Illness    Renee Bryant was first evaluated by Dr. Flora Lipps on 05/02/2023 for NSTEMI.  Patient presented to the ED with a 1 day history of chest discomfort described as burning radiating to her left arm with associated nausea and vomiting as well as shortness of breath.  Pepcid partially relieved symptoms.  Upon arrival to [facility] she was noted to be significantly hyperglycemic.  Troponin 113>> 146.  She was transferred to [facility].  She underwent LHC which showed proximal to mid LAD 40%, D1 90% not amenable to PCI secondary to concern for plaque shift/impingement of the LAD.  Aggressive medical therapy and risk factor modification was recommended.   Patient was last seen in the office by Carlos Levering NP-C, on 05/13/2019 for for hospital follow-up.  She was doing well.  She inquired about restarting Lasix (which she was taking prior to hospitalization) secondary to lower  extremity edema.  She was nonedematous at her visit.  Secondary to CKD labs were drawn prior to restarting Lasix.  Kidney function was worse than baseline with creatinine of 2.07.  She was instructed to stop losartan.  Repeat BMP showed creatinine back to baseline at 1.45.   She presents to the clinic today for follow-up evaluation and states that her lower extremity swelling is significantly improved from what it was.  Her blood pressure today is 132/74.  She has been following a low-sodium diet, reducing her fluid, and elevating her feet.  We reviewed her cardiac catheterization and she expressed understanding.  Her furosemide was previously stopped due to kidney function.  On last check it slightly improved.  She does note that since her last visit her weight has increased from 197 pounds to 205 pounds.  She does not think this is related to food.  She does have right greater than left lower extremity generalized edema.  I will restart her furosemide 20 mg every other day and repeat a BMP in 1 to 2 weeks.  We will repeat her fasting lipids and LFTs in 8 weeks and plan follow-up in 1 to 2 months.   Today she denies chest pain, shortness of breath, fatigue, palpitations, melena, hematuria, hemoptysis, diaphoresis, weakness, presyncope, syncope, orthopnea, and PND.   Studies Reviewed       Echocardiogram 05/02/2023  IMPRESSIONS     1. Distal septal apical hypokinesis . Left ventricular ejection fraction,  by estimation, is 45 to 50%. The left ventricle has mildly decreased  function. The left ventricle demonstrates  regional wall motion  abnormalities (see scoring diagram/findings for   description). The left ventricular internal cavity size was mildly  dilated. Left ventricular diastolic parameters were normal.   2. Right ventricular systolic function is normal. The right ventricular  size is normal.   3. The mitral valve is abnormal. Trivial mitral valve regurgitation. No  evidence of mitral  stenosis.   4. The aortic valve was not well visualized. There is mild calcification  of the aortic valve. Aortic valve regurgitation is not visualized. Aortic  valve sclerosis is present, with no evidence of aortic valve stenosis.   5. The inferior vena cava is normal in size with greater than 50%  respiratory variability, suggesting right atrial pressure of 3 mmHg.   FINDINGS   Left Ventricle: Distal septal apical hypokinesis. Left ventricular  ejection fraction, by estimation, is 45 to 50%. The left ventricle has  mildly decreased function. The left ventricle demonstrates regional wall  motion abnormalities. Definity contrast  agent was given IV to delineate the left ventricular endocardial borders.  The left ventricular internal cavity size was mildly dilated. There is no  left ventricular hypertrophy. Left ventricular diastolic parameters were  normal.   Right Ventricle: The right ventricular size is normal. No increase in  right ventricular wall thickness. Right ventricular systolic function is  normal.   Left Atrium: Left atrial size was normal in size.   Right Atrium: Right atrial size was normal in size.   Pericardium: There is no evidence of pericardial effusion.   Mitral Valve: The mitral valve is abnormal. There is mild thickening of  the mitral valve leaflet(s). Trivial mitral valve regurgitation. No  evidence of mitral valve stenosis.   Tricuspid Valve: The tricuspid valve is normal in structure. Tricuspid  valve regurgitation is not demonstrated. No evidence of tricuspid  stenosis.   Aortic Valve: The aortic valve was not well visualized. There is mild  calcification of the aortic valve. Aortic valve regurgitation is not  visualized. Aortic valve sclerosis is present, with no evidence of aortic  valve stenosis. Aortic valve mean  gradient measures 3.0 mmHg. Aortic valve peak gradient measures 4.6 mmHg.  Aortic valve area, by VTI measures 2.78 cm.   Pulmonic  Valve: The pulmonic valve was normal in structure. Pulmonic valve  regurgitation is not visualized. No evidence of pulmonic stenosis.   Aorta: The aortic root is normal in size and structure.   Venous: The inferior vena cava is normal in size with greater than 50%  respiratory variability, suggesting right atrial pressure of 3 mmHg.   IAS/Shunts: No atrial level shunt detected by color flow Doppler.     Risk Assessment/Calculations               Physical Exam    VS:  BP 132/74   Pulse 77   Ht 5\' 7"  (1.702 m)   Wt 205 lb (93 kg)   LMP 05/01/2023   SpO2 98%   BMI 32.11 kg/m  , BMI Body mass index is 32.11 kg/m.  GEN: Well nourished, well developed, in no acute distress. Neck: No JVD or carotid bruits. Cardiac:  RRR. No murmurs. No rubs or gallops.   Respiratory:  Respirations regular and unlabored. Clear to auscultation without rales, wheezing or rhonchi. GI: Soft, nontender, nondistended. Extremities: Radials/DP/PT 2+ and equal bilaterally. No clubbing or cyanosis.  Generalized nonpitting right greater than left edema Skin: Warm and dry, no rash. Neuro: Strength intact.  Assessment & Plan   CAD-no chest  pain today.  LHC June 2024 showed proximal to mid LAD 40%, D1 90% not amenable to PCI secondary to concern for plaque shift/impingement of the LAD.   Continue atorvastatin, aspirin, Plavix, metoprolol, Ranexa. Heart healthy low-sodium diet  HFmrEF-  Echo June 2024 showed EF 45 to 50% with normal RV function.  Weight stable.  NYHA class I-2.  Has returned to all of her normal daily activities. Continue metoprolol,  Start furosemide 20 every other day.  Heart healthy low-sodium diet Daily weights-contact office with weight increase of 2 to 3 pounds overnight or 5 pounds in 1 week Repeat BMP in 1-2 weeks.  Hypertension- BP today 132/74. Patient denies headaches, dizziness or vision changes. Continue metoprolol.  Hyperlipidemia- LDL June 2024 139, not at goal.   Continue atorvastatin.  Plan for repeat lipid panel and LFTs in 8 weeks.  Tobacco abuse. Patient continues to smoke less than a pack of cigarettes a day.  Using sugar free candy Tobacco cessation info given    Disposition: Plan follow up with DW in around 2 months.       Thomasene Ripple. Amsi Grimley NP-C     05/24/2023, 4:36 PM [facility] [address] Office [phone] Fax [phone]

## 2023-05-24 NOTE — Patient Instructions (Signed)
Medication Instructions:  START FUROSEMIDE (LASIX) 20MG  EVERY-OTHER-DAY  *If you need a refill on your cardiac medications before your next appointment, please call your pharmacy*  Lab Work: BMET IN 1-2 WEEKS AND  FASTING LIPID AND LFT IN 8 WEEKS If you have labs (blood work) drawn today and your tests are completely normal, you will receive your results only by:  MyChart Message (if you have MyChart) OR  A paper copy in the mail If you have any lab test that is abnormal or we need to change your treatment, we will call you to review the results.  Testing/Procedures: NONE  Follow-Up: At [facility], you and your health needs are our priority.  As part of our continuing mission to provide you with exceptional heart care, we have created designated Provider Care Teams.  These Care Teams include your primary Cardiologist (physician) and Advanced Practice Providers (APPs -  Physician Assistants and Nurse Practitioners) who all work together to provide you with the care you need, when you need it.  Your next appointment:   2 month(s)  Provider:   Carlos Levering, NP       Other Instructions GET A SCALE AND LOG YOUR WEIGHT DAILY PLEASE FOLLOW LOW SALT DIET-ATTACHED     DASH Eating Plan-LOW SALT DIET DASH stands for Dietary Approaches to Stop Hypertension. The DASH eating plan is a healthy eating plan that has been shown to: Lower high blood pressure (hypertension). Reduce your risk for type 2 diabetes, heart disease, and stroke. Help with weight loss. What are tips for following this plan? Reading food labels Check food labels for the amount of salt (sodium) per serving. Choose foods with less than 5 percent of the Daily Value (DV) of sodium. In general, foods with less than 300 milligrams (mg) of sodium per serving fit into this eating plan. To find whole grains, look for the word "whole" as the first word in the ingredient list. Shopping Buy products labeled as  "low-sodium" or "no salt added." Buy fresh foods. Avoid canned foods and pre-made or frozen meals. Cooking Try not to add salt when you cook. Use salt-free seasonings or herbs instead of table salt or sea salt. Check with your health care provider or pharmacist before using salt substitutes. Do not fry foods. Cook foods in healthy ways, such as baking, boiling, grilling, roasting, or broiling. Cook using oils that are good for your heart. These include olive, canola, avocado, soybean, and sunflower oil. Meal planning  Eat a balanced diet. This should include: 4 or more servings of fruits and 4 or more servings of vegetables each day. Try to fill half of your plate with fruits and vegetables. 6-8 servings of whole grains each day. 6 or less servings of lean meat, poultry, or fish each day. 1 oz is 1 serving. A 3 oz (85 g) serving of meat is about the same size as the palm of your hand. One egg is 1 oz (28 g). 2-3 servings of low-fat dairy each day. One serving is 1 cup (237 mL). 1 serving of nuts, seeds, or beans 5 times each week. 2-3 servings of heart-healthy fats. Healthy fats called omega-3 fatty acids are found in foods such as walnuts, flaxseeds, fortified milks, and eggs. These fats are also found in cold-water fish, such as sardines, salmon, and mackerel. Limit how much you eat of: Canned or prepackaged foods. Food that is high in trans fat, such as fried foods. Food that is high in saturated fat,  such as fatty meat. Desserts and other sweets, sugary drinks, and other foods with added sugar. Full-fat dairy products. Do not salt foods before eating. Do not eat more than 4 egg yolks a week. Try to eat at least 2 vegetarian meals a week. Eat more home-cooked food and less restaurant, buffet, and fast food. Lifestyle When eating at a restaurant, ask if your food can be made with less salt or no salt. If you drink alcohol: Limit how much you have to: 0-1 drink a day if you are  female. 0-2 drinks a day if you are female. Know how much alcohol is in your drink. In the [place] one drink is one 12 oz bottle of beer (355 mL), one 5 oz glass of wine (148 mL), or one 1 oz glass of hard liquor (44 mL). General information Avoid eating more than 2,300 mg of salt a day. If you have hypertension, you may need to reduce your sodium intake to 1,500 mg a day. Work with your provider to stay at a healthy body weight or lose weight. Ask what the best weight range is for you. On most days of the week, get at least 30 minutes of exercise that causes your heart to beat faster. This may include walking, swimming, or biking. Work with your provider or dietitian to adjust your eating plan to meet your specific calorie needs. What foods should I eat? Fruits All fresh, dried, or frozen fruit. Canned fruits that are in their natural juice and do not have sugar added to them. Vegetables Fresh or frozen vegetables that are raw, steamed, roasted, or grilled. Low-sodium or reduced-sodium tomato and vegetable juice. Low-sodium or reduced-sodium tomato sauce and tomato paste. Low-sodium or reduced-sodium canned vegetables. Grains Whole-grain or whole-wheat bread. Whole-grain or whole-wheat pasta. Brown rice. Orpah Cobb. Bulgur. Whole-grain and low-sodium cereals. Pita bread. Low-fat, low-sodium crackers. Whole-wheat flour tortillas. Meats and other proteins Skinless chicken or [place]. Ground chicken or [place]. Pork with fat trimmed off. Fish and seafood. Egg whites. Dried beans, peas, or lentils. Unsalted nuts, nut butters, and seeds. Unsalted canned beans. Lean cuts of beef with fat trimmed off. Low-sodium, lean precooked or cured meat, such as sausages or meat loaves. Dairy Low-fat (1%) or fat-free (skim) milk. Reduced-fat, low-fat, or fat-free cheeses. Nonfat, low-sodium ricotta or cottage cheese. Low-fat or nonfat yogurt. Low-fat, low-sodium cheese. Fats and oils Soft margarine without trans  fats. Vegetable oil. Reduced-fat, low-fat, or light mayonnaise and salad dressings (reduced-sodium). Canola, safflower, olive, avocado, soybean, and sunflower oils. Avocado. Seasonings and condiments Herbs. Spices. Seasoning mixes without salt. Other foods Unsalted popcorn and pretzels. Fat-free sweets. The items listed above may not be all the foods and drinks you can have. Talk to a dietitian to learn more. What foods should I avoid? Fruits Canned fruit in a light or heavy syrup. Fried fruit. Fruit in cream or butter sauce. Vegetables Creamed or fried vegetables. Vegetables in a cheese sauce. Regular canned vegetables that are not marked as low-sodium or reduced-sodium. Regular canned tomato sauce and paste that are not marked as low-sodium or reduced-sodium. Regular tomato and vegetable juices that are not marked as low-sodium or reduced-sodium. Rosita Fire. Olives. Grains Baked goods made with fat, such as croissants, muffins, or some breads. Dry pasta or rice meal packs. Meats and other proteins Fatty cuts of meat. Ribs. Fried meat. Tomasa Blase. Bologna, salami, and other precooked or cured meats, such as sausages or meat loaves, that are not lean and low in sodium. Fat from  the back of a pig (fatback). Bratwurst. Salted nuts and seeds. Canned beans with added salt. Canned or smoked fish. Whole eggs or egg yolks. Chicken or [place] with skin. Dairy Whole or 2% milk, cream, and half-and-half. Whole or full-fat cream cheese. Whole-fat or sweetened yogurt. Full-fat cheese. Nondairy creamers. Whipped toppings. Processed cheese and cheese spreads. Fats and oils Butter. Stick margarine. Lard. Shortening. Ghee. Bacon fat. Tropical oils, such as coconut, palm kernel, or palm oil. Seasonings and condiments Onion salt, garlic salt, seasoned salt, table salt, and sea salt. Worcestershire sauce. Tartar sauce. Barbecue sauce. Teriyaki sauce. Soy sauce, including reduced-sodium soy sauce. Steak sauce. Canned and  packaged gravies. Fish sauce. Oyster sauce. Cocktail sauce. Store-bought horseradish. Ketchup. Mustard. Meat flavorings and tenderizers. Bouillon cubes. Hot sauces. Pre-made or packaged marinades. Pre-made or packaged taco seasonings. Relishes. Regular salad dressings. Other foods Salted popcorn and pretzels. The items listed above may not be all the foods and drinks you should avoid. Talk to a dietitian to learn more. Where to find more information National Heart, Lung, and Blood Institute (NHLBI): BuffaloDryCleaner.gl American Heart Association (AHA): heart.org Academy of Nutrition and Dietetics: eatright.org National Kidney Foundation (NKF): kidney.org This information is not intended to replace advice given to you by your health care provider. Make sure you discuss any questions you have with your health care provider. Document Revised: 11/29/2022 Document Reviewed: 11/29/2022 Elsevier Patient Education  2024 [employer].

## 2023-05-27 DIAGNOSIS — Z419 Encounter for procedure for purposes other than remedying health state, unspecified: Secondary | ICD-10-CM | POA: Diagnosis not present

## 2023-05-28 DIAGNOSIS — H3589 Other specified retinal disorders: Secondary | ICD-10-CM | POA: Diagnosis not present

## 2023-05-28 DIAGNOSIS — E113513 Type 2 diabetes mellitus with proliferative diabetic retinopathy with macular edema, bilateral: Secondary | ICD-10-CM | POA: Diagnosis not present

## 2023-05-28 LAB — HM DIABETES EYE EXAM

## 2023-06-01 ENCOUNTER — Other Ambulatory Visit: Payer: Self-pay | Admitting: Internal Medicine

## 2023-06-03 ENCOUNTER — Telehealth: Payer: Self-pay | Admitting: Student

## 2023-06-03 MED ORDER — METOPROLOL SUCCINATE ER 25 MG PO TB24: 25.0000 mg | ORAL_TABLET | Freq: Every day | ORAL | 0 refills | Status: DC

## 2023-06-03 MED ORDER — CLOPIDOGREL BISULFATE 75 MG PO TABS: 75.0000 mg | ORAL_TABLET | Freq: Every day | ORAL | 0 refills | Status: DC

## 2023-06-03 MED ORDER — ATORVASTATIN CALCIUM 80 MG PO TABS: 80.0000 mg | ORAL_TABLET | Freq: Every day | ORAL | 0 refills | Status: DC

## 2023-06-03 MED ORDER — RANOLAZINE ER 500 MG PO TB12: 500.0000 mg | ORAL_TABLET | Freq: Two times a day (BID) | ORAL | 0 refills | Status: DC

## 2023-06-03 NOTE — Telephone Encounter (Signed)
Medication sent to pharmacy  

## 2023-06-03 NOTE — Telephone Encounter (Signed)
*  STAT* If patient is at the pharmacy, call can be transferred to refill team.   1. Which medications need to be refilled? (please list name of each medication and dose if known) new prescriptions for  Ranolazine, Clopidogrel, Metoprolol, and Atorvastatin  2. Which pharmacy/location (including street and city if local pharmacy) is medication to be sent to? [facility]. [place]  3. Do they need a 30 day or 90 day supply? 90 days and refills

## 2023-06-07 ENCOUNTER — Other Ambulatory Visit: Payer: Self-pay | Admitting: Internal Medicine

## 2023-06-07 ENCOUNTER — Other Ambulatory Visit: Payer: Self-pay

## 2023-06-07 ENCOUNTER — Encounter (HOSPITAL_BASED_OUTPATIENT_CLINIC_OR_DEPARTMENT_OTHER): Payer: Self-pay | Admitting: Emergency Medicine

## 2023-06-07 DIAGNOSIS — Z7982 Long term (current) use of aspirin: Secondary | ICD-10-CM | POA: Diagnosis not present

## 2023-06-07 DIAGNOSIS — Z794 Long term (current) use of insulin: Secondary | ICD-10-CM | POA: Diagnosis not present

## 2023-06-07 DIAGNOSIS — Z7901 Long term (current) use of anticoagulants: Secondary | ICD-10-CM | POA: Insufficient documentation

## 2023-06-07 DIAGNOSIS — R1032 Left lower quadrant pain: Secondary | ICD-10-CM | POA: Diagnosis not present

## 2023-06-07 DIAGNOSIS — B20 Human immunodeficiency virus [HIV] disease: Secondary | ICD-10-CM | POA: Diagnosis not present

## 2023-06-07 DIAGNOSIS — J45909 Unspecified asthma, uncomplicated: Secondary | ICD-10-CM | POA: Diagnosis not present

## 2023-06-07 DIAGNOSIS — J449 Chronic obstructive pulmonary disease, unspecified: Secondary | ICD-10-CM | POA: Insufficient documentation

## 2023-06-07 DIAGNOSIS — I2510 Atherosclerotic heart disease of native coronary artery without angina pectoris: Secondary | ICD-10-CM | POA: Diagnosis not present

## 2023-06-07 DIAGNOSIS — E119 Type 2 diabetes mellitus without complications: Secondary | ICD-10-CM | POA: Diagnosis not present

## 2023-06-07 DIAGNOSIS — K6289 Other specified diseases of anus and rectum: Secondary | ICD-10-CM | POA: Diagnosis not present

## 2023-06-07 LAB — CBC
HCT: 34.2 % — ABNORMAL LOW (ref 36.0–46.0)
Hemoglobin: 12 g/dL (ref 12.0–15.0)
MCH: 34.6 pg — ABNORMAL HIGH (ref 26.0–34.0)
MCHC: 35.1 g/dL (ref 30.0–36.0)
MCV: 98.6 fL (ref 80.0–100.0)
Platelets: 283 10*3/uL (ref 150–400)
RBC: 3.47 MIL/uL — ABNORMAL LOW (ref 3.87–5.11)
RDW: 13.3 % (ref 11.5–15.5)
WBC: 8.9 10*3/uL (ref 4.0–10.5)
nRBC: 0 % (ref 0.0–0.2)

## 2023-06-07 LAB — COMPREHENSIVE METABOLIC PANEL
ALT: 22 U/L (ref 0–44)
AST: 22 U/L (ref 15–41)
Albumin: 2.5 g/dL — ABNORMAL LOW (ref 3.5–5.0)
Alkaline Phosphatase: 56 U/L (ref 38–126)
Anion gap: 7 (ref 5–15)
BUN: 24 mg/dL — ABNORMAL HIGH (ref 6–20)
CO2: 24 mmol/L (ref 22–32)
Calcium: 8.3 mg/dL — ABNORMAL LOW (ref 8.9–10.3)
Chloride: 107 mmol/L (ref 98–111)
Creatinine, Ser: 1.3 mg/dL — ABNORMAL HIGH (ref 0.44–1.00)
GFR, Estimated: 53 mL/min — ABNORMAL LOW (ref >60)
Glucose, Bld: 211 mg/dL — ABNORMAL HIGH (ref 70–99)
Potassium: 3.9 mmol/L (ref 3.5–5.1)
Sodium: 138 mmol/L (ref 135–145)
Total Bilirubin: 0.3 mg/dL (ref 0.3–1.2)
Total Protein: 5.8 g/dL — ABNORMAL LOW (ref 6.5–8.1)

## 2023-06-07 LAB — LIPASE, BLOOD: Lipase: 46 U/L (ref 11–51)

## 2023-06-07 NOTE — ED Triage Notes (Signed)
Patient with abdominal pain that started 3 days ago.  Patient states that the pain radiates to her rectum. Patient states she is nauseated, but no vomiting.  She states that it hurts to sit, hurts to cough, hurts to sneeze.

## 2023-06-08 ENCOUNTER — Emergency Department (HOSPITAL_BASED_OUTPATIENT_CLINIC_OR_DEPARTMENT_OTHER)
Admission: EM | Admit: 2023-06-08 | Discharge: 2023-06-08 | Disposition: A | Discharge status: Home / Self Care | Payer: Medicaid Other | Attending: Emergency Medicine | Admitting: Emergency Medicine

## 2023-06-08 DIAGNOSIS — R1032 Left lower quadrant pain: Secondary | ICD-10-CM

## 2023-06-08 LAB — URINALYSIS, MICROSCOPIC (REFLEX)

## 2023-06-08 LAB — URINALYSIS, ROUTINE W REFLEX MICROSCOPIC
Bilirubin Urine: NEGATIVE
Glucose, UA: 250 mg/dL — AB
Ketones, ur: NEGATIVE mg/dL
Leukocytes,Ua: NEGATIVE
Nitrite: NEGATIVE
Protein, ur: >=300 mg/dL — AB
Specific Gravity, Urine: >=1.03 (ref 1.005–1.030)
pH: 6.5 (ref 5.0–8.0)

## 2023-06-08 LAB — PREGNANCY, URINE: Preg Test, Ur: NEGATIVE

## 2023-06-08 MED ORDER — AMOXICILLIN-POT CLAVULANATE 875-125 MG PO TABS
1.0000 | ORAL_TABLET | Freq: Once | ORAL | Status: AC
  Administered 2023-06-08: 1 via ORAL
  Filled 2023-06-08: qty 1

## 2023-06-08 MED ORDER — AMOXICILLIN-POT CLAVULANATE 875-125 MG PO TABS: 1.0000 | ORAL_TABLET | Freq: Two times a day (BID) | ORAL | 0 refills | Status: DC

## 2023-06-08 MED ORDER — HYDROCODONE-ACETAMINOPHEN 5-325 MG PO TABS
1.0000 | ORAL_TABLET | Freq: Once | ORAL | Status: AC
  Administered 2023-06-08: 1 via ORAL
  Filled 2023-06-08: qty 1

## 2023-06-08 NOTE — ED Provider Notes (Signed)
[place] [facility] AT [facility] Provider Note   CSN: [id number] Arrival date & time: 06/07/23  2228     History  Chief Complaint  Patient presents with   Abdominal Pain    Renee Bryant is a 42 y.o. female.  The history is provided by the patient.  Patient with extensive history including CAD, COPD, diabetes, HIV presents with abdominal pain She reports about 2-3 days ago she started having intermittent left lower quadrant pain and rectal pain.  No fevers or vomiting.  No change in bowel movements.  No painful defecation. No dysuria.  No vaginal bleeding or discharge. She has had pain with her fibroids previously, but this feels different. No bloody stools The pain is worse when she is sitting down. No recent rectal trauma  She reports her HIV is well-controlled and has been undetectable due to medications She reports she recently underwent cardiac catheterization for an MI   Past Medical History:  Diagnosis Date   Abscess    Asthma    COPD (chronic obstructive pulmonary disease) (HCC)    Diabetes mellitus without complication (HCC)    Emphysema of lung (HCC)    HIV (human immunodeficiency virus infection) (HCC)    Pneumonia     Home Medications Prior to Admission medications   Medication Sig Start Date End Date Taking? Authorizing Provider  amoxicillin-clavulanate (AUGMENTIN) 875-125 MG tablet Take 1 tablet by mouth every 12 (twelve) hours. 06/08/23  Yes Zadie Rhine, MD  albuterol (VENTOLIN HFA) 108 (90 Base) MCG/ACT inhaler Inhale 1-2 puffs into the lungs every 6 (six) hours as needed for wheezing or shortness of breath. 11/11/22   Palumbo, April, MD  aspirin 81 MG chewable tablet Chew 1 tablet (81 mg total) by mouth daily. 05/03/23   Danford, Earl Lites, MD  atorvastatin (LIPITOR) 80 MG tablet Take 1 tablet (80 mg total) by mouth daily. 06/03/23 07/03/23  Carlos Levering, NP  bictegravir-emtricitabine-tenofovir AF (BIKTARVY) 50-200-25  MG TABS tablet Take by mouth daily.    [provider]  clonazePAM (KLONOPIN) 0.5 MG tablet Take 1 mg by mouth 2 (two) times daily.     [provider]  clopidogrel (PLAVIX) 75 MG tablet Take 1 tablet (75 mg total) by mouth daily with breakfast. 06/03/23 07/03/23  Carlos Levering, NP  ergocalciferol (VITAMIN D2) 1.25 MG (50000 UT) capsule  10/09/19   [provider]  furosemide (LASIX) 20 MG tablet Take 1 tablet (20 mg total) by mouth every other day. 05/24/23   Ronney Asters, NP  insulin glargine (LANTUS) 100 UNIT/ML injection Inject 18 Units into the skin at bedtime.    [provider]  insulin lispro (HUMALOG) 100 UNIT/ML KwikPen Inject 0-11 Units into the skin 3 (three) times daily with meals based on sliding scale: Blood Glucose <150= 0 unit; BG 150-200= 1 unit; BG 201-250= 3 unit; BG 251-300= 5 unit; BG 301-350= 7 unit; BG 351-400= 9 unit; BG >400= 11 unit and Call Primary Care. 05/02/23   Danford, Earl Lites, MD  Insulin Pen Needle 32G X 4 MM MISC Use three times daily with Humalog kwikpen and with glargine 05/02/23   Danford, Earl Lites, MD  metoprolol succinate (TOPROL-XL) 25 MG 24 hr tablet Take 1 tablet (25 mg total) by mouth daily. 06/03/23 07/03/23  Carlos Levering, NP  ranolazine (RANEXA) 500 MG 12 hr tablet Take 1 tablet (500 mg total) by mouth 2 (two) times daily. 06/03/23 07/03/23  Carlos Levering, NP  valACYclovir (VALTREX) 500 MG  tablet Take 500 mg by mouth 2 (two) times daily as needed (For cold sores).    [provider]  Varenicline Tartrate, Starter, (CHANTIX STARTING MONTH PAK) 0.5 MG X 11 & 1 MG X 42 TBPK Follow directions printed on package 05/13/23   Carlos Levering, NP      Allergies    Metformin and related, Wellbutrin [bupropion], and Trulicity [dulaglutide]    Review of Systems   Review of Systems  Constitutional:  Negative for fever.  Respiratory:  Negative for shortness of breath.   Cardiovascular:  Negative for  chest pain.  Gastrointestinal:  Positive for nausea and rectal pain. Negative for vomiting.  Genitourinary:  Negative for dysuria, vaginal bleeding and vaginal discharge.    Physical Exam Updated Vital Signs BP (!) 147/87   Pulse 83   Temp 98.2 F (36.8 C) (Oral)   Resp 18   SpO2 97%  Physical Exam CONSTITUTIONAL: Appears older than stated age, no acute distress, sleeping on arrival to room and resting comfortably HEAD: Normocephalic/atraumatic EYES: EOMI/PERRL ENMT: Mucous membranes moist NECK: supple no meningeal signs CV: S1/S2 noted, no murmurs/rubs/gallops noted LUNGS: Lungs are clear to auscultation bilaterally, no apparent distress ABDOMEN: soft, mild LLQ tenderness, no rebound or guarding, bowel sounds noted throughout abdomen GU:no cva tenderness Rectal-chaperoned by nurse.  No external hemorrhoids, no mass or abscess.  No evidence of any perirectal abscess.  Normal external genitalia NEURO: Pt is awake/alert/appropriate, moves all extremitiesx4.  No facial droop.   SKIN: warm, color normal  ED Results / Procedures / Treatments   Labs (all labs ordered are listed, but only abnormal results are displayed) Labs Reviewed  COMPREHENSIVE METABOLIC PANEL - Abnormal; Notable for the following components:      Result Value   Glucose, Bld 211 (*)    BUN 24 (*)    Creatinine, Ser 1.30 (*)    Calcium 8.3 (*)    Total Protein 5.8 (*)    Albumin 2.5 (*)    GFR, Estimated 53 (*)    All other components within normal limits  CBC - Abnormal; Notable for the following components:   RBC 3.47 (*)    HCT 34.2 (*)    MCH 34.6 (*)    All other components within normal limits  URINALYSIS, ROUTINE W REFLEX MICROSCOPIC - Abnormal; Notable for the following components:   Glucose, UA 250 (*)    Hgb urine dipstick TRACE (*)    Protein, ur >=300 (*)    All other components within normal limits  URINALYSIS, MICROSCOPIC (REFLEX) - Abnormal; Notable for the following components:    Bacteria, UA RARE (*)    All other components within normal limits  LIPASE, BLOOD  PREGNANCY, URINE    EKG None  Radiology No results found.  Procedures Procedures    Medications Ordered in ED Medications  amoxicillin-clavulanate (AUGMENTIN) 875-125 MG per tablet 1 tablet (1 tablet Oral Given 06/08/23 0153)  HYDROcodone-acetaminophen (NORCO/VICODIN) 5-325 MG per tablet 1 tablet (1 tablet Oral Given 06/08/23 0153)    ED Course/ Medical Decision Making/ A&P                             Medical Decision Making Amount and/or Complexity of Data Reviewed Labs: ordered.  Risk Prescription drug management.   Patient overall well-appearing on exam.  She was sleeping on arrival the room and no distress. Labs reveal hyperglycemia but overall unremarkable Patient had recent pelvic and abdominal  imaging back in April. Ultrasound results revealed uterine fibroids. CT imaging revealed enteritis at that time but also diverticulosis.  It is possible patient is having an early flareup of diverticulitis given the lower abdominal pain as well as rectal pain.  However on exam no signs of any thrombosed hemorrhoids, no signs of any perirectal abscess.  She is overall in no acute distress.  Patient would prefer to avoid any CT imaging. After discussing risks and benefits, plan will be to monitor her symptoms at home.  Will start oral antibiotics in case this is early diverticulitis. Patient has no signs of surgical abdomen on exam.  She has no leukocytosis.  We discussed strict ER return precautions which include worsening pain, fever, vomiting in the next 48 hours.       Final Clinical Impression(s) / ED Diagnoses Final diagnoses:  Left lower quadrant abdominal pain    Rx / DC Orders ED Discharge Orders          Ordered    amoxicillin-clavulanate (AUGMENTIN) 875-125 MG tablet  Every 12 hours        06/08/23 0148              Zadie Rhine, MD 06/08/23 [phone]

## 2023-06-08 NOTE — Discharge Instructions (Signed)

## 2023-06-18 ENCOUNTER — Telehealth: Payer: Self-pay | Admitting: Cardiovascular Disease

## 2023-06-18 NOTE — Telephone Encounter (Signed)
Pt c/o medication issue:  1. Name of Medication: insulin lispro (HUMALOG) 100 UNIT/ML KwikPen   insulin glargine (LANTUS) 100 UNIT/ML injection    2. How are you currently taking this medication (dosage and times per day)? As prescribed   3. Are you having a reaction (difficulty breathing--STAT)?   4. What is your medication issue? Patient is requesting call back to see if these medications can be called in for patient until she sees her new PCP. Please advise.

## 2023-06-18 NOTE — Telephone Encounter (Signed)
Pt informed unfortunately insulin is not a medication our office prescribes. Pt encouraged to contact previous pcp or urgent care. Pt verbalized understanding.

## 2023-06-19 DIAGNOSIS — E109 Type 1 diabetes mellitus without complications: Secondary | ICD-10-CM | POA: Diagnosis not present

## 2023-06-20 ENCOUNTER — Ambulatory Visit: Payer: Medicaid Other | Admitting: Family Medicine

## 2023-06-24 ENCOUNTER — Emergency Department (HOSPITAL_COMMUNITY): Payer: Medicaid Other

## 2023-06-24 ENCOUNTER — Emergency Department (HOSPITAL_COMMUNITY)
Admission: EM | Admit: 2023-06-24 | Discharge: 2023-06-24 | Discharge status: Against Medical Advice | Payer: Medicaid Other | Attending: Emergency Medicine | Admitting: Emergency Medicine

## 2023-06-24 DIAGNOSIS — R1031 Right lower quadrant pain: Secondary | ICD-10-CM | POA: Insufficient documentation

## 2023-06-24 DIAGNOSIS — R079 Chest pain, unspecified: Secondary | ICD-10-CM | POA: Diagnosis not present

## 2023-06-24 DIAGNOSIS — R509 Fever, unspecified: Secondary | ICD-10-CM | POA: Insufficient documentation

## 2023-06-24 DIAGNOSIS — Z5321 Procedure and treatment not carried out due to patient leaving prior to being seen by health care provider: Secondary | ICD-10-CM | POA: Insufficient documentation

## 2023-06-24 DIAGNOSIS — R1909 Other intra-abdominal and pelvic swelling, mass and lump: Secondary | ICD-10-CM | POA: Diagnosis not present

## 2023-06-24 DIAGNOSIS — R0789 Other chest pain: Secondary | ICD-10-CM | POA: Diagnosis not present

## 2023-06-24 DIAGNOSIS — R1084 Generalized abdominal pain: Secondary | ICD-10-CM | POA: Diagnosis not present

## 2023-06-24 DIAGNOSIS — Z743 Need for continuous supervision: Secondary | ICD-10-CM | POA: Diagnosis not present

## 2023-06-24 DIAGNOSIS — I252 Old myocardial infarction: Secondary | ICD-10-CM | POA: Diagnosis not present

## 2023-06-24 DIAGNOSIS — R0689 Other abnormalities of breathing: Secondary | ICD-10-CM | POA: Diagnosis not present

## 2023-06-24 LAB — URINALYSIS, W/ REFLEX TO CULTURE (INFECTION SUSPECTED)
Bilirubin Urine: NEGATIVE
Glucose, UA: 150 mg/dL — AB
Hgb urine dipstick: NEGATIVE
Ketones, ur: NEGATIVE mg/dL
Leukocytes,Ua: NEGATIVE
Nitrite: NEGATIVE
Protein, ur: >=300 mg/dL — AB
Specific Gravity, Urine: 1.028 (ref 1.005–1.030)
pH: 6 (ref 5.0–8.0)

## 2023-06-24 LAB — BASIC METABOLIC PANEL
Anion gap: 12 (ref 5–15)
BUN: 23 mg/dL — ABNORMAL HIGH (ref 6–20)
CO2: 22 mmol/L (ref 22–32)
Calcium: 8.4 mg/dL — ABNORMAL LOW (ref 8.9–10.3)
Chloride: 102 mmol/L (ref 98–111)
Creatinine, Ser: 1.6 mg/dL — ABNORMAL HIGH (ref 0.44–1.00)
GFR, Estimated: 41 mL/min — ABNORMAL LOW (ref >60)
Glucose, Bld: 240 mg/dL — ABNORMAL HIGH (ref 70–99)
Potassium: 4.1 mmol/L (ref 3.5–5.1)
Sodium: 136 mmol/L (ref 135–145)

## 2023-06-24 LAB — CBC
HCT: 37.2 % (ref 36.0–46.0)
Hemoglobin: 12.3 g/dL (ref 12.0–15.0)
MCH: 33.4 pg (ref 26.0–34.0)
MCHC: 33.1 g/dL (ref 30.0–36.0)
MCV: 101.1 fL — ABNORMAL HIGH (ref 80.0–100.0)
Platelets: 239 10*3/uL (ref 150–400)
RBC: 3.68 MIL/uL — ABNORMAL LOW (ref 3.87–5.11)
RDW: 12.5 % (ref 11.5–15.5)
WBC: 16.2 10*3/uL — ABNORMAL HIGH (ref 4.0–10.5)
nRBC: 0 % (ref 0.0–0.2)

## 2023-06-24 LAB — HCG, SERUM, QUALITATIVE: Preg, Serum: NEGATIVE

## 2023-06-24 LAB — TROPONIN I (HIGH SENSITIVITY): Troponin I (High Sensitivity): 6 ng/L (ref <18)

## 2023-06-24 MED ORDER — MORPHINE SULFATE (PF) 4 MG/ML IV SOLN: 4.0000 mg | Freq: Once | INTRAVENOUS | Status: DC

## 2023-06-24 NOTE — ED Notes (Signed)
Taken for Xray.

## 2023-06-24 NOTE — ED Notes (Signed)
Pt stated she is going to another [facility] and requested her IV removed.

## 2023-06-24 NOTE — ED Notes (Signed)
Pt seen leaving the ED

## 2023-06-24 NOTE — ED Triage Notes (Signed)
Pt BIB [facility] for right lower abd pain onset 1700 tonight with new onset chest pain. Reports pain radiates to right leg. States chest pain feels similar to when she had prior MI couple months ago. Reports distended abdomen and states pain is radiating to her right flank and down right leg. Denies chest pain at this time.   Given Fentanyl 50 mcg en route and nitroglycerin 0.4 given   CBG 270 en route BP 145/94 HR 81 SpO2 97%

## 2023-06-24 NOTE — ED Provider Triage Note (Signed)
Emergency Medicine Provider Triage Evaluation Note  Renee Bryant , a 42 y.o. female  was evaluated in triage.  Pt complains of right lower quad pain and chest pain.  Patient states that the symptoms began an hour around 1700.  Patient states that the chest pain goes into her left arm and that it feels very similar to previous heart attack.  Patient received nitro via EMS and now the pain has subsided.  Patient also endorses right lower quad pain states she still has her appendix.  Patient states that she has felt feverish at home and slightly nauseous but the Zofran has helped from EMS.  Patient denies changes in sensation/motor skills, new onset weakness, leg swelling.  Review of Systems  Positive: Workup initiated Negative: Workup initiated  Physical Exam  BP 103/72 (BP Location: Right Arm)   Pulse 80   Temp 98.2 F (36.8 C) (Oral)   Resp 16   Ht 5\' 7"  (1.702 m)   Wt 93.4 kg   SpO2 97%   BMI 32.26 kg/m  Gen:   Awake, no distress   Resp:  Normal effort  MSK:   Moves extremities without difficulty  Other:  Right lower quad tenderness without peritoneal signs  Medical Decision Making  Medically screening exam initiated at 9:46 PM.  Appropriate orders placed.  Renee Bryant was informed that the remainder of the evaluation will be completed by another provider, this initial triage assessment does not replace that evaluation, and the importance of remaining in the ED until their evaluation is complete.  Workup initiated, patient stable at this time   Remi Deter 06/24/23 2147

## 2023-06-26 ENCOUNTER — Encounter (HOSPITAL_BASED_OUTPATIENT_CLINIC_OR_DEPARTMENT_OTHER): Payer: Self-pay | Admitting: Emergency Medicine

## 2023-06-26 ENCOUNTER — Emergency Department (HOSPITAL_BASED_OUTPATIENT_CLINIC_OR_DEPARTMENT_OTHER)
Admission: EM | Admit: 2023-06-26 | Discharge: 2023-06-26 | Disposition: A | Discharge status: Home / Self Care | Payer: Medicaid Other | Attending: Emergency Medicine | Admitting: Emergency Medicine

## 2023-06-26 ENCOUNTER — Emergency Department (HOSPITAL_BASED_OUTPATIENT_CLINIC_OR_DEPARTMENT_OTHER): Payer: Medicaid Other

## 2023-06-26 ENCOUNTER — Other Ambulatory Visit: Payer: Self-pay

## 2023-06-26 DIAGNOSIS — J449 Chronic obstructive pulmonary disease, unspecified: Secondary | ICD-10-CM | POA: Diagnosis not present

## 2023-06-26 DIAGNOSIS — E1165 Type 2 diabetes mellitus with hyperglycemia: Secondary | ICD-10-CM | POA: Insufficient documentation

## 2023-06-26 DIAGNOSIS — N83291 Other ovarian cyst, right side: Secondary | ICD-10-CM | POA: Diagnosis not present

## 2023-06-26 DIAGNOSIS — R1030 Lower abdominal pain, unspecified: Secondary | ICD-10-CM | POA: Diagnosis present

## 2023-06-26 DIAGNOSIS — D259 Leiomyoma of uterus, unspecified: Secondary | ICD-10-CM | POA: Diagnosis not present

## 2023-06-26 DIAGNOSIS — Z79899 Other long term (current) drug therapy: Secondary | ICD-10-CM | POA: Diagnosis not present

## 2023-06-26 DIAGNOSIS — Z794 Long term (current) use of insulin: Secondary | ICD-10-CM | POA: Insufficient documentation

## 2023-06-26 DIAGNOSIS — I10 Essential (primary) hypertension: Secondary | ICD-10-CM | POA: Diagnosis not present

## 2023-06-26 DIAGNOSIS — R1031 Right lower quadrant pain: Secondary | ICD-10-CM | POA: Diagnosis not present

## 2023-06-26 DIAGNOSIS — E119 Type 2 diabetes mellitus without complications: Secondary | ICD-10-CM | POA: Diagnosis not present

## 2023-06-26 DIAGNOSIS — I2510 Atherosclerotic heart disease of native coronary artery without angina pectoris: Secondary | ICD-10-CM | POA: Insufficient documentation

## 2023-06-26 DIAGNOSIS — R739 Hyperglycemia, unspecified: Secondary | ICD-10-CM

## 2023-06-26 DIAGNOSIS — Z21 Asymptomatic human immunodeficiency virus [HIV] infection status: Secondary | ICD-10-CM | POA: Diagnosis not present

## 2023-06-26 DIAGNOSIS — Z7982 Long term (current) use of aspirin: Secondary | ICD-10-CM | POA: Insufficient documentation

## 2023-06-26 LAB — PREGNANCY, URINE: Preg Test, Ur: NEGATIVE

## 2023-06-26 LAB — COMPREHENSIVE METABOLIC PANEL
ALT: 21 U/L (ref 0–44)
AST: 16 U/L (ref 15–41)
Albumin: 2 g/dL — ABNORMAL LOW (ref 3.5–5.0)
Alkaline Phosphatase: 51 U/L (ref 38–126)
Anion gap: 10 (ref 5–15)
BUN: 22 mg/dL — ABNORMAL HIGH (ref 6–20)
CO2: 25 mmol/L (ref 22–32)
Calcium: 8.1 mg/dL — ABNORMAL LOW (ref 8.9–10.3)
Chloride: 100 mmol/L (ref 98–111)
Creatinine, Ser: 1.5 mg/dL — ABNORMAL HIGH (ref 0.44–1.00)
GFR, Estimated: 45 mL/min — ABNORMAL LOW (ref >60)
Glucose, Bld: 323 mg/dL — ABNORMAL HIGH (ref 70–99)
Potassium: 3.9 mmol/L (ref 3.5–5.1)
Sodium: 135 mmol/L (ref 135–145)
Total Bilirubin: 0.3 mg/dL (ref 0.3–1.2)
Total Protein: 5.6 g/dL — ABNORMAL LOW (ref 6.5–8.1)

## 2023-06-26 LAB — CBC WITH DIFFERENTIAL/PLATELET
Abs Immature Granulocytes: 0.03 10*3/uL (ref 0.00–0.07)
Basophils Absolute: 0 10*3/uL (ref 0.0–0.1)
Basophils Relative: 0 %
Eosinophils Absolute: 0.3 10*3/uL (ref 0.0–0.5)
Eosinophils Relative: 3 %
HCT: 32.7 % — ABNORMAL LOW (ref 36.0–46.0)
Hemoglobin: 11.3 g/dL — ABNORMAL LOW (ref 12.0–15.0)
Immature Granulocytes: 0 %
Lymphocytes Relative: 17 %
Lymphs Abs: 1.8 10*3/uL (ref 0.7–4.0)
MCH: 34.3 pg — ABNORMAL HIGH (ref 26.0–34.0)
MCHC: 34.6 g/dL (ref 30.0–36.0)
MCV: 99.4 fL (ref 80.0–100.0)
Monocytes Absolute: 0.8 10*3/uL (ref 0.1–1.0)
Monocytes Relative: 8 %
Neutro Abs: 7.4 10*3/uL (ref 1.7–7.7)
Neutrophils Relative %: 72 %
Platelets: 210 10*3/uL (ref 150–400)
RBC: 3.29 MIL/uL — ABNORMAL LOW (ref 3.87–5.11)
RDW: 12.4 % (ref 11.5–15.5)
WBC: 10.3 10*3/uL (ref 4.0–10.5)
nRBC: 0 % (ref 0.0–0.2)

## 2023-06-26 LAB — LIPASE, BLOOD: Lipase: 34 U/L (ref 11–51)

## 2023-06-26 LAB — URINALYSIS, MICROSCOPIC (REFLEX)

## 2023-06-26 LAB — URINALYSIS, ROUTINE W REFLEX MICROSCOPIC
Bilirubin Urine: NEGATIVE
Glucose, UA: >=500 mg/dL — AB
Ketones, ur: NEGATIVE mg/dL
Leukocytes,Ua: NEGATIVE
Nitrite: NEGATIVE
Protein, ur: >=300 mg/dL — AB
Specific Gravity, Urine: 1.025 (ref 1.005–1.030)
pH: 7 (ref 5.0–8.0)

## 2023-06-26 MED ORDER — HYDROCODONE-ACETAMINOPHEN 5-325 MG PO TABS: 1.0000 | ORAL_TABLET | Freq: Four times a day (QID) | ORAL | 0 refills | Status: DC | PRN

## 2023-06-26 MED ORDER — IOHEXOL 300 MG/ML  SOLN
80.0000 mL | Freq: Once | INTRAMUSCULAR | Status: AC | PRN
  Administered 2023-06-26: 80 mL via INTRAVENOUS

## 2023-06-26 MED ORDER — OXYCODONE-ACETAMINOPHEN 5-325 MG PO TABS
1.0000 | ORAL_TABLET | Freq: Once | ORAL | Status: AC
  Administered 2023-06-26: 1 via ORAL
  Filled 2023-06-26: qty 1

## 2023-06-26 MED ORDER — ONDANSETRON 4 MG PO TBDP: 4.0000 mg | ORAL_TABLET | Freq: Three times a day (TID) | ORAL | 0 refills | Status: DC | PRN

## 2023-06-26 NOTE — ED Provider Notes (Signed)
[facility] Provider Note   CSN: [id number] Arrival date & time: 06/26/23  1809     History  Chief Complaint  Patient presents with   Abdominal Pain    Renee Bryant is a 42 y.o. female.  Patient with history of coronary artery disease status post NSTEMI in June 2024, COPD, diabetes, HIV, history of cholecystectomy and cesarean section --presents to the emergency department today for evaluation of ongoing lower abdominal pain.  Patient is reporting generalized abdominal pain but worse in the lower 2 quadrants.  No associated vomiting or diarrhea.  No urinary symptoms.  Patient was seen about 2 weeks ago in the emergency room for similar symptoms.  She was treated empirically for diverticulitis at that time.  She wanted to avoid CT imaging.  The Augmentin did seem to help per her report, but her symptoms have returned.  She is working on establishing care with GI currently.       Home Medications Prior to Admission medications   Medication Sig Start Date End Date Taking? Authorizing Provider  albuterol (VENTOLIN HFA) 108 (90 Base) MCG/ACT inhaler Inhale 1-2 puffs into the lungs every 6 (six) hours as needed for wheezing or shortness of breath. 11/11/22   Palumbo, April, MD  amoxicillin-clavulanate (AUGMENTIN) 875-125 MG tablet Take 1 tablet by mouth every 12 (twelve) hours. 06/08/23   Zadie Rhine, MD  aspirin 81 MG chewable tablet Chew 1 tablet (81 mg total) by mouth daily. 05/03/23   Danford, Earl Lites, MD  atorvastatin (LIPITOR) 80 MG tablet Take 1 tablet (80 mg total) by mouth daily. 06/03/23 07/03/23  Carlos Levering, NP  bictegravir-emtricitabine-tenofovir AF (BIKTARVY) 50-200-25 MG TABS tablet Take by mouth daily.    [provider]  clonazePAM (KLONOPIN) 0.5 MG tablet Take 1 mg by mouth 2 (two) times daily.     [provider]  clopidogrel (PLAVIX) 75 MG tablet Take 1 tablet (75 mg total) by mouth daily with  breakfast. 06/03/23 07/03/23  Carlos Levering, NP  ergocalciferol (VITAMIN D2) 1.25 MG (50000 UT) capsule  10/09/19   [provider]  furosemide (LASIX) 20 MG tablet Take 1 tablet (20 mg total) by mouth every other day. 05/24/23   Ronney Asters, NP  insulin glargine (LANTUS) 100 UNIT/ML injection Inject 18 Units into the skin at bedtime.    [provider]  insulin lispro (HUMALOG) 100 UNIT/ML KwikPen Inject 0-11 Units into the skin 3 (three) times daily with meals based on sliding scale: Blood Glucose <150= 0 unit; BG 150-200= 1 unit; BG 201-250= 3 unit; BG 251-300= 5 unit; BG 301-350= 7 unit; BG 351-400= 9 unit; BG >400= 11 unit and Call Primary Care. 05/02/23   Danford, Earl Lites, MD  Insulin Pen Needle 32G X 4 MM MISC Use three times daily with Humalog kwikpen and with glargine 05/02/23   Danford, Earl Lites, MD  metoprolol succinate (TOPROL-XL) 25 MG 24 hr tablet Take 1 tablet (25 mg total) by mouth daily. 06/03/23 07/03/23  Carlos Levering, NP  ranolazine (RANEXA) 500 MG 12 hr tablet Take 1 tablet (500 mg total) by mouth 2 (two) times daily. 06/03/23 07/03/23  Carlos Levering, NP  valACYclovir (VALTREX) 500 MG tablet Take 500 mg by mouth 2 (two) times daily as needed (For cold sores).    [provider]  Varenicline Tartrate, Starter, (CHANTIX STARTING MONTH PAK) 0.5 MG X 11 & 1 MG X 42 TBPK Follow directions printed on package 05/13/23  Carlos Levering, NP      Allergies    Jardiance [empagliflozin], Metformin and related, Wellbutrin [bupropion], and Trulicity [dulaglutide]    Review of Systems   Review of Systems  Physical Exam Updated Vital Signs BP (!) 168/106 (BP Location: Right Arm)   Pulse 96   Temp 98.6 F (37 C)   Resp 18   Ht 5\' 7"  (1.702 m)   Wt 90.7 kg   SpO2 99%   BMI 31.32 kg/m  Physical Exam Vitals and nursing note reviewed.  Constitutional:      General: She is not in acute distress.    Appearance: She is well-developed.   HENT:     Head: Normocephalic and atraumatic.     Right Ear: External ear normal.     Left Ear: External ear normal.     Nose: Nose normal.  Eyes:     Conjunctiva/sclera: Conjunctivae normal.  Cardiovascular:     Rate and Rhythm: Normal rate and regular rhythm.     Heart sounds: No murmur heard. Pulmonary:     Effort: No respiratory distress.     Breath sounds: No wheezing, rhonchi or rales.  Abdominal:     Palpations: Abdomen is soft.     Tenderness: There is abdominal tenderness in the right lower quadrant, suprapubic area and left lower quadrant. There is no guarding or rebound. Negative signs include Murphy's sign and McBurney's sign.  Musculoskeletal:     Cervical back: Normal range of motion and neck supple.     Right lower leg: No edema.     Left lower leg: No edema.  Skin:    General: Skin is warm and dry.     Findings: No rash.  Neurological:     General: No focal deficit present.     Mental Status: She is alert. Mental status is at baseline.     Motor: No weakness.  Psychiatric:        Mood and Affect: Mood normal.     ED Results / Procedures / Treatments   Labs (all labs ordered are listed, but only abnormal results are displayed) Labs Reviewed  COMPREHENSIVE METABOLIC PANEL - Abnormal; Notable for the following components:      Result Value   Glucose, Bld 323 (*)    BUN 22 (*)    Creatinine, Ser 1.50 (*)    Calcium 8.1 (*)    Total Protein 5.6 (*)    Albumin 2.0 (*)    GFR, Estimated 45 (*)    All other components within normal limits  URINALYSIS, ROUTINE W REFLEX MICROSCOPIC - Abnormal; Notable for the following components:   Glucose, UA >=500 (*)    Hgb urine dipstick SMALL (*)    Protein, ur >=300 (*)    All other components within normal limits  CBC WITH DIFFERENTIAL/PLATELET - Abnormal; Notable for the following components:   RBC 3.29 (*)    Hemoglobin 11.3 (*)    HCT 32.7 (*)    MCH 34.3 (*)    All other components within normal limits   URINALYSIS, MICROSCOPIC (REFLEX) - Abnormal; Notable for the following components:   Bacteria, UA FEW (*)    All other components within normal limits  LIPASE, BLOOD  PREGNANCY, URINE    EKG None  Radiology DG Chest 2 View  Result Date: 06/24/2023 CLINICAL DATA:  Chest pain.  Recent heart attack. EXAM: CHEST - 2 VIEW COMPARISON:  05/01/2023. FINDINGS: The heart size and mediastinal contours are within normal limits.  Both lungs are clear. Mild degenerative changes are present in the thoracic spine. No acute osseous abnormality. Surgical clips are present in the right upper quadrant. IMPRESSION: No active cardiopulmonary disease. Electronically Signed   By: Thornell Sartorius M.D.   On: 06/24/2023 22:10    Procedures Procedures    Medications Ordered in ED Medications - No data to display  ED Course/ Medical Decision Making/ A&P    Patient seen and examined. History obtained directly from patient. Work-up including labs, imaging, EKG ordered in triage, if performed, were reviewed.    Labs/EKG: Independently reviewed and interpreted.  This included: CBC with white blood cell count normal at 10.3, hemoglobin slightly low at 11.3 otherwise unremarkable; CMP glucose elevated at 323, creatinine near baseline at 1.5 with a BUN of 22, normal electrolytes and liver function testing; lipase normal; UA without compelling signs of infection.  Imaging: CT ordered  Medications/Fluids: None ordered  Most recent vital signs reviewed and are as follows: BP (!) 168/106 (BP Location: Right Arm)   Pulse 96   Temp 98.6 F (37 C)   Resp 18   Ht 5\' 7"  (1.702 m)   Wt 90.7 kg   SpO2 99%   BMI 31.32 kg/m   Initial impression: Recurrent lower abdominal pain  11:21 PM Reassessment performed. Patient appears stable.  Requesting pain medication and will give a dose of oral Percocet prior to discharge.  Imaging personally visualized and interpreted including: CT, agree no signs of infection, uterine  fibroids noted, right ovarian cyst noted.  Reviewed pertinent lab work and imaging with patient at bedside. Questions answered.   Most current vital signs reviewed and are as follows: BP (!) 142/91 (BP Location: Right Arm)   Pulse 67   Temp 98.6 F (37 C) (Oral)   Resp 18   Ht 5\' 7"  (1.702 m)   Wt 90.7 kg   SpO2 96%   BMI 31.32 kg/m   Plan: Discharge to home.   Prescriptions written for: Zofran, Vicodin # 6 tablets  Patient counseled on use of narcotic pain medications. Counseled not to combine these medications with others containing tylenol. Urged not to drink alcohol, drive, or perform any other activities that requires focus while taking these medications. The patient verbalizes understanding and agrees with the plan.  Other home care instructions discussed: Avoidance of triggers.   ED return instructions discussed: The patient was urged to return to the Emergency Department immediately with worsening of current symptoms, worsening abdominal pain, persistent vomiting, blood noted in stools, fever, or any other concerns. The patient verbalized understanding.   Follow-up instructions discussed: Patient encouraged to follow-up with their PCP in 3 days. She also has upcoming routine appointment with her GYN and will discuss symptoms at that time as well.                                Medical Decision Making Amount and/or Complexity of Data Reviewed Labs: ordered. Radiology: ordered.  Risk Prescription drug management.   For this patient's complaint of abdominal pain, the following conditions were considered on the differential diagnosis: gastritis/PUD, enteritis/duodenitis, appendicitis, cholelithiasis/cholecystitis, cholangitis, pancreatitis, ruptured viscus, colitis, diverticulitis, small/large bowel obstruction, proctitis, cystitis, pyelonephritis, ureteral colic, aortic dissection, aortic aneurysm. In women, ectopic pregnancy, pelvic inflammatory disease, ovarian cysts, and  tubo-ovarian abscess were also considered. Atypical chest etiologies were also considered including ACS, PE, and pneumonia.  Hyperglycemia noted, no evidence of DKA today.  Patient will continue home meds.  The patient's vital signs, pertinent lab work and imaging were reviewed and interpreted as discussed in the ED course. Hospitalization was considered for further testing, treatments, or serial exams/observation. However as patient is well-appearing, has a stable exam, and reassuring studies today, I do not feel that they warrant admission at this time. This plan was discussed with the patient who verbalizes agreement and comfort with this plan and seems reliable and able to return to the Emergency Department with worsening or changing symptoms.            Final Clinical Impression(s) / ED Diagnoses Final diagnoses:  Right lower quadrant abdominal pain    Rx / DC Orders ED Discharge Orders          Ordered    HYDROcodone-acetaminophen (NORCO/VICODIN) 5-325 MG tablet  Every 6 hours PRN        06/26/23 2314    ondansetron (ZOFRAN-ODT) 4 MG disintegrating tablet  Every 8 hours PRN        06/26/23 2314              Renne Crigler, PA-C 06/26/23 2329    Lonell Grandchild, MD 06/28/23 [phone]

## 2023-06-26 NOTE — ED Triage Notes (Signed)
Pt c/o continued abd pain; was seen recently for same on 7/13, but sts no imaging was done; feel abd is bloated, +nausea

## 2023-06-26 NOTE — Discharge Instructions (Signed)
Please read and follow all provided instructions.  Your diagnoses today include:  1. Right lower quadrant abdominal pain     Tests performed today include: Blood cell counts and platelets Kidney and liver function tests Pancreas function test (called lipase) Urine test to look for infection A blood or urine test for pregnancy (women only) CT scan of your abdomen showed uterine fibroids as well as a right ovarian cyst, no signs of infection Vital signs. See below for your results today.   Medications prescribed:  Vicodin (hydrocodone/acetaminophen) - narcotic pain medication  DO NOT drive or perform any activities that require you to be awake and alert because this medicine can make you drowsy. BE VERY CAREFUL not to take multiple medicines containing Tylenol (also called acetaminophen). Doing so can lead to an overdose which can damage your liver and cause liver failure and possibly death.  Take any prescribed medications only as directed.  Home care instructions:  Follow any educational materials contained in this packet.  Follow-up instructions: Please follow-up with your primary care provider in the next 3 days for further evaluation of your symptoms.    Return instructions:  SEEK IMMEDIATE MEDICAL ATTENTION IF: The pain does not go away or becomes severe  A temperature above 101F develops  Repeated vomiting occurs (multiple episodes)  The pain becomes localized to portions of the abdomen. The right side could possibly be appendicitis. In an adult, the left lower portion of the abdomen could be colitis or diverticulitis.  Blood is being passed in stools or vomit (bright red or black tarry stools)  You develop chest pain, difficulty breathing, dizziness or fainting, or become confused, poorly responsive, or inconsolable (young children) If you have any other emergent concerns regarding your health  Additional Information: Abdominal (belly) pain can be caused by many things.  Your caregiver performed an examination and possibly ordered blood/urine tests and imaging (CT scan, x-rays, ultrasound). Many cases can be observed and treated at home after initial evaluation in the emergency department. Even though you are being discharged home, abdominal pain can be unpredictable. Therefore, you need a repeated exam if your pain does not resolve, returns, or worsens. Most patients with abdominal pain don't have to be admitted to the hospital or have surgery, but serious problems like appendicitis and gallbladder attacks can start out as nonspecific pain. Many abdominal conditions cannot be diagnosed in one visit, so follow-up evaluations are very important.  Your vital signs today were: BP (!) 142/91 (BP Location: Right Arm)   Pulse 67   Temp 98.6 F (37 C) (Oral)   Resp 18   Ht 5\' 7"  (1.702 m)   Wt 90.7 kg   SpO2 96%   BMI 31.32 kg/m  If your blood pressure (bp) was elevated above 135/85 this visit, please have this repeated by your doctor within one month. --------------

## 2023-06-27 DIAGNOSIS — Z419 Encounter for procedure for purposes other than remedying health state, unspecified: Secondary | ICD-10-CM | POA: Diagnosis not present

## 2023-07-03 ENCOUNTER — Telehealth: Payer: Self-pay | Admitting: Cardiovascular Disease

## 2023-07-03 ENCOUNTER — Other Ambulatory Visit: Payer: Self-pay | Admitting: *Deleted

## 2023-07-03 MED ORDER — CLOPIDOGREL BISULFATE 75 MG PO TABS: 75.0000 mg | ORAL_TABLET | Freq: Every day | ORAL | 0 refills | Status: DC

## 2023-07-03 MED ORDER — ATORVASTATIN CALCIUM 80 MG PO TABS: 80.0000 mg | ORAL_TABLET | Freq: Every day | ORAL | 0 refills | Status: DC

## 2023-07-03 MED ORDER — RANOLAZINE ER 500 MG PO TB12: 500.0000 mg | ORAL_TABLET | Freq: Two times a day (BID) | ORAL | 0 refills | Status: DC

## 2023-07-03 MED ORDER — METOPROLOL SUCCINATE ER 25 MG PO TB24: 25.0000 mg | ORAL_TABLET | Freq: Every day | ORAL | 0 refills | Status: DC

## 2023-07-03 NOTE — Telephone Encounter (Signed)
*  STAT* If patient is at the pharmacy, call can be transferred to refill team.   1. Which medications need to be refilled? (please list name of each medication and dose if known)   atorvastatin (LIPITOR) 80 MG tablet  metoprolol succinate (TOPROL-XL) 25 MG 24 hr tablet  ranolazine (RANEXA) 500 MG 12 hr tablet  clopidogrel (PLAVIX) 75 MG tablet   2. Would you like to learn more about the convenience, safety, & potential cost savings by using the [facility]?   3. Are you open to using the [facility] (Type [facility]. ).  4. Which pharmacy/location (including street and city if local pharmacy) is medication to be sent to?  [id number] - [place], [place] - [address]   5. Do they need a 30 day or 90 day supply?   30 day  Patient stated she has 3 days left of these medications.  Patient has appointment scheduled on 8/22.

## 2023-07-05 DIAGNOSIS — J45909 Unspecified asthma, uncomplicated: Secondary | ICD-10-CM | POA: Insufficient documentation

## 2023-07-08 ENCOUNTER — Encounter: Payer: Self-pay | Admitting: Family Medicine

## 2023-07-08 ENCOUNTER — Ambulatory Visit (INDEPENDENT_AMBULATORY_CARE_PROVIDER_SITE_OTHER): Payer: Medicaid Other | Admitting: Family Medicine

## 2023-07-08 VITALS — BP 129/82 | HR 76 | Ht 67.0 in | Wt 201.0 lb

## 2023-07-08 DIAGNOSIS — F39 Unspecified mood [affective] disorder: Secondary | ICD-10-CM

## 2023-07-08 DIAGNOSIS — I10 Essential (primary) hypertension: Secondary | ICD-10-CM | POA: Diagnosis not present

## 2023-07-08 DIAGNOSIS — J449 Chronic obstructive pulmonary disease, unspecified: Secondary | ICD-10-CM | POA: Diagnosis not present

## 2023-07-08 DIAGNOSIS — N1831 Chronic kidney disease, stage 3a: Secondary | ICD-10-CM

## 2023-07-08 DIAGNOSIS — E1165 Type 2 diabetes mellitus with hyperglycemia: Secondary | ICD-10-CM | POA: Diagnosis not present

## 2023-07-08 DIAGNOSIS — B20 Human immunodeficiency virus [HIV] disease: Secondary | ICD-10-CM | POA: Diagnosis not present

## 2023-07-08 DIAGNOSIS — E119 Type 2 diabetes mellitus without complications: Secondary | ICD-10-CM | POA: Insufficient documentation

## 2023-07-08 DIAGNOSIS — Z0000 Encounter for general adult medical examination without abnormal findings: Secondary | ICD-10-CM

## 2023-07-08 DIAGNOSIS — E559 Vitamin D deficiency, unspecified: Secondary | ICD-10-CM | POA: Diagnosis not present

## 2023-07-08 DIAGNOSIS — N183 Chronic kidney disease, stage 3 unspecified: Secondary | ICD-10-CM | POA: Diagnosis not present

## 2023-07-08 DIAGNOSIS — Z794 Long term (current) use of insulin: Secondary | ICD-10-CM

## 2023-07-08 LAB — MICROALBUMIN / CREATININE URINE RATIO
Creatinine,U: 158.8 mg/dL
Microalb Creat Ratio: 237.5 mg/g — ABNORMAL HIGH (ref 0.0–30.0)
Microalb, Ur: 377.2 mg/dL — ABNORMAL HIGH (ref 0.0–1.9)

## 2023-07-08 NOTE — Assessment & Plan Note (Signed)
Currently stable Smoking cessation encouraged Pulmonology referral placed

## 2023-07-08 NOTE — Assessment & Plan Note (Signed)
Not currently on supplementation  Labs today  

## 2023-07-08 NOTE — Assessment & Plan Note (Signed)
Blood pressure is at goal for age and co-morbidities.   Recommendations: continue current meds - BP goal <130/80 - monitor and log blood pressures at home - check around the same time each day in a relaxed setting - Limit salt to <2000 mg/day - Follow DASH eating plan (heart healthy diet) - limit alcohol to 2 standard drinks per day for men and 1 per day for women - avoid tobacco products - get at least 2 hours of regular aerobic exercise weekly Patient aware of signs/symptoms requiring further/urgent evaluation. Labs updated today.  

## 2023-07-08 NOTE — Assessment & Plan Note (Signed)
Following with [facility] - encouraged she schedule follow-up Repeat labs today

## 2023-07-08 NOTE — Assessment & Plan Note (Signed)
Poorly controlled with last A1c >13% (refer to endocrinology and pharmacy team) Continue current medications for now Discussed diet and exercise F/u in 6 weeks to recheck A1c

## 2023-07-08 NOTE — Progress Notes (Signed)
New Patient Office Visit  Subjective    Patient ID: Renee Bryant, female    DOB: 08/09/1981  Age: 42 y.o. MRN: [id number]  CC:  Chief Complaint  Patient presents with   Establish Care    HPI Renee Bryant presents to establish care   Discussed the use of AI scribe software for clinical note transcription with the patient, who gave verbal consent to proceed.  History of Present Illness   The patient, with a history of type 2 diabetes, COPD, asthmatic bronchitis, HIV, and stage 3 kidney disease, and recent myocardial infarction, presents to establish care, transferring from [facility]. They attribute the cardiac event to poor diabetes management by their previous primary care provider, who had them on Lantus alone for a year. The patient reports multiple allergies and adverse reactions to oral hypoglycemics, including metformin, Jardiance, and Trulicity, which led to the exclusive use of Lantus.   They were recently started on Humalog in addition to Lantus, which has resulted in improved blood glucose control, with levels mostly under 200 mg/dL. However, they are still learning to balance their diet to prevent hypoglycemic episodes, which have occurred a few times.  The patient is also living with HIV, which has been well-managed and undetectable for the past 15 years. They are currently on Biktarvy and following with [facility].  The patient is a current smoker and is attempting to quit, but has had adverse reactions to Chantix in the past. They also report severe anxiety and PTSD, for which they take Klonopin as needed. They are seeking a new psychiatrist for long-term management of these conditions.        Diabetes: - Checking glucose at home: yes, fasting <200 consistently  - Medications: Lantus 18 units nightly; Humalog sliding scale with meals - Compliance: good - Diet: heart healthy, diabetic - Exercise: minimal - Eye exam: unknown - Foot exam:  unknown - Microalbumin: today - Denies symptoms of hypoglycemia, polyuria, polydipsia, numbness extremities, foot ulcers/trauma, wounds that are not healing, medication side effects    HTN, HLD, CAD (NSTEMI 05/02/23 -proximal to mid LAD 40%, D1 90% not amenable to PCI secondary to concern for plaque shift/impingement of the LAD. Aggressive medical therapy and risk factor modification was recommended; EF 45-50%) : - Medications: aspirin 81 mg daily, Plavix 75 mg daily, Lasix 20 mg every other day, metoprolol succinate 25 mg daily, ranexa 500 mg BID - Compliance: good - Checking BP at home: rarely - Denies any SOB, recurrent headaches, CP, vision changes, LE edema, dizziness, palpitations, or medication side effects. - Diet: trying for heart healthy diabetic diet - Exercise: minimal - Following with [facility]    Outpatient Encounter Medications as of 07/08/2023  Medication Sig   albuterol (VENTOLIN HFA) 108 (90 Base) MCG/ACT inhaler Inhale 1-2 puffs into the lungs every 6 (six) hours as needed for wheezing or shortness of breath.   aspirin 81 MG chewable tablet Chew 1 tablet (81 mg total) by mouth daily.   bictegravir-emtricitabine-tenofovir AF (BIKTARVY) 50-200-25 MG TABS tablet Take by mouth daily.   clonazePAM (KLONOPIN) 0.5 MG tablet Take 1 mg by mouth 2 (two) times daily.    clopidogrel (PLAVIX) 75 MG tablet Take 1 tablet (75 mg total) by mouth daily with breakfast.   furosemide (LASIX) 20 MG tablet Take 1 tablet (20 mg total) by mouth every other day.   insulin glargine (LANTUS) 100 UNIT/ML injection Inject 18 Units into the skin at bedtime.   insulin  lispro (HUMALOG) 100 UNIT/ML KwikPen Inject 0-11 Units into the skin 3 (three) times daily with meals based on sliding scale: Blood Glucose <150= 0 unit; BG 150-200= 1 unit; BG 201-250= 3 unit; BG 251-300= 5 unit; BG 301-350= 7 unit; BG 351-400= 9 unit; BG >400= 11 unit and Call Primary Care.   Insulin Pen Needle 32G X 4 MM  MISC Use three times daily with Humalog kwikpen and with glargine   metoprolol succinate (TOPROL-XL) 25 MG 24 hr tablet Take 1 tablet (25 mg total) by mouth daily.   ondansetron (ZOFRAN-ODT) 4 MG disintegrating tablet Take 1 tablet (4 mg total) by mouth every 8 (eight) hours as needed for nausea or vomiting.   ranolazine (RANEXA) 500 MG 12 hr tablet Take 1 tablet (500 mg total) by mouth 2 (two) times daily.   valACYclovir (VALTREX) 500 MG tablet Take 500 mg by mouth 2 (two) times daily as needed (For cold sores).   [DISCONTINUED] atorvastatin (LIPITOR) 80 MG tablet Take 1 tablet (80 mg total) by mouth daily.   [DISCONTINUED] ergocalciferol (VITAMIN D2) 1.25 MG (50000 UT) capsule    [DISCONTINUED] Varenicline Tartrate, Starter, (CHANTIX STARTING MONTH PAK) 0.5 MG X 11 & 1 MG X 42 TBPK Follow directions printed on package   [DISCONTINUED] amoxicillin-clavulanate (AUGMENTIN) 875-125 MG tablet Take 1 tablet by mouth every 12 (twelve) hours.   [DISCONTINUED] HYDROcodone-acetaminophen (NORCO/VICODIN) 5-325 MG tablet Take 1 tablet by mouth every 6 (six) hours as needed for severe pain.   No facility-administered encounter medications on file as of 07/08/2023.    Past Medical History:  Diagnosis Date   Abscess    Asthma    COPD (chronic obstructive pulmonary disease) (HCC)    Diabetes mellitus without complication (HCC)    Emphysema of lung (HCC)    HIV (human immunodeficiency virus infection) (HCC)    Pneumonia     Past Surgical History:  Procedure Laterality Date   CESAREAN SECTION     CHOLECYSTECTOMY     FOOT SURGERY Left    LEFT HEART CATH AND CORONARY ANGIOGRAPHY N/A 05/03/2023   Procedure: LEFT HEART CATH AND CORONARY ANGIOGRAPHY;  Surgeon: [place], Peter M, MD;  Location: [facility];  Service: Cardiovascular;  Laterality: N/A;   TUBAL LIGATION      History reviewed. No pertinent family history.  Social History   Socioeconomic History   Marital status: Married    Spouse  name: Not on file   Number of children: Not on file   Years of education: Not on file   Highest education level: Not on file  Occupational History   Not on file  Tobacco Use   Smoking status: Every Day    Current packs/day: 1.00    Types: Cigarettes   Smokeless tobacco: Never  Vaping Use   Vaping status: Never Used  Substance and Sexual Activity   Alcohol use: No   Drug use: No   Sexual activity: Not on file  Other Topics Concern   Not on file  Social History Narrative   Not on file   Social Determinants of Health   Financial Resource Strain: Not on file  Food Insecurity: Patient Declined (05/02/2023)   Hunger Vital Sign    Worried About Running Out of Food in the Last Year: Patient declined    Ran Out of Food in the Last Year: Patient declined  Transportation Needs: No Transportation Needs (05/02/2023)   PRAPARE - Administrator, Civil Service (Medical): No  Lack of Transportation (Non-Medical): No  Physical Activity: Not on file  Stress: Not on file  Social Connections: Unknown (04/03/2022)   [facility]   Social Network    Social Network: Not on file  Intimate Partner Violence: Not At Risk (05/02/2023)   Humiliation, Afraid, Rape, and Kick questionnaire    Fear of Current or Ex-Partner: No    Emotionally Abused: No    Physically Abused: No    Sexually Abused: No    ROS All review of systems negative except what is listed in the HPI      Objective    BP 129/82   Pulse 76   Ht 5\' 7"  (1.702 m)   Wt 201 lb (91.2 kg)   BMI 31.48 kg/m   Physical Exam Vitals reviewed.  Constitutional:      Appearance: Normal appearance.  Cardiovascular:     Rate and Rhythm: Normal rate and regular rhythm.     Heart sounds: Normal heart sounds.  Pulmonary:     Effort: Pulmonary effort is normal.     Breath sounds: Normal breath sounds.  Skin:    General: Skin is warm and dry.  Neurological:     Mental Status: She is alert and  oriented to person, place, and time.  Psychiatric:        Mood and Affect: Mood normal.        Behavior: Behavior normal.        Thought Content: Thought content normal.        Judgment: Judgment normal.         Assessment & Plan:   Problem List Items Addressed This Visit     HIV (human immunodeficiency virus infection) (HCC) (Chronic)    Well-managed with undetectable levels for the past 15 years. Currently on Biktarvy. -Continue current management with infectious disease specialist.      Essential hypertension (Chronic)    Blood pressure is at goal for age and co-morbidities.   Recommendations: continue current meds - BP goal <130/80 - monitor and log blood pressures at home - check around the same time each day in a relaxed setting - Limit salt to <2000 mg/day - Follow DASH eating plan (heart healthy diet) - limit alcohol to 2 standard drinks per day for men and 1 per day for women - avoid tobacco products - get at least 2 hours of regular aerobic exercise weekly Patient aware of signs/symptoms requiring further/urgent evaluation. Labs updated today.       Relevant Orders   Comp Met (CMET)   AMB Referral to Pharmacy Medication Management   Uncontrolled type 2 diabetes mellitus with hyperglycemia, with long-term current use of insulin (HCC) (Chronic)    Poorly controlled with last A1c >13% (refer to endocrinology and pharmacy team) Continue current medications for now Discussed diet and exercise F/u in 6 weeks to recheck A1c        Relevant Orders   Ambulatory referral to Endocrinology   AMB Referral to Pharmacy Medication Management   Microalbumin / creatinine urine ratio   CKD stage 3a, GFR 45-59 ml/min (HCC) (Chronic)    Following with [facility] - encouraged she schedule follow-up Repeat labs today       Mood disorder (HCC) (Chronic)    Advised that I do not prescribed chronic benzos without psychiatry referral - order placed. Patient agreeable.        Relevant Orders   Ambulatory referral to Psychiatry   COPD (chronic obstructive  pulmonary disease) (HCC) (Chronic)    Currently stable Smoking cessation encouraged Pulmonology referral placed      Relevant Orders   Ambulatory referral to Pulmonology   Vitamin D deficiency (Chronic)    Not currently on supplementation Labs today       Relevant Orders   Vitamin D (25 hydroxy)   Other Visit Diagnoses     Encounter for medical examination to establish care    -  Primary    Hypomagnesemia     Patient reports history of low magnesium. No supplementation. Recheck today.    Relevant Orders   Magnesium       Return in about 6 weeks (around 08/19/2023) for routine follow-up (A1c).   Clayborne Dana, NP

## 2023-07-08 NOTE — Assessment & Plan Note (Signed)
Advised that I do not prescribed chronic benzos without psychiatry referral - order placed. Patient agreeable.

## 2023-07-08 NOTE — Patient Instructions (Signed)
Thank you for choosing [facility] for your Primary Care needs. I am excited for the opportunity to partner with you to meet your health care goals. It was a pleasure meeting you today!  Information on diet, exercise, and health maintenance recommendations are listed below. This is information to help you be sure you are on track for optimal health and monitoring.   Please look over this and let us know if you have any questions or if you have completed any of the health maintenance outside of [place] so that we can be sure your records are up to date.  ___________________________________________________________  MyChart:  For all urgent or time sensitive needs we ask that you please call the office to avoid delays. Our number is [phone]. MyChart is not constantly monitored and due to the large volume of messages a day, replies may take up to 72 business hours.  MyChart Policy: MyChart allows for you to see your visit notes, after visit summary, provider recommendations, lab and tests results, make an appointment, request refills, and contact your provider or the office for non-urgent questions or concerns. Providers are seeing patients during normal business hours and do not have built in time to review MyChart messages.  We ask that you allow a minimum of 3 business days for responses to MyChart messages. For this reason, please do not send urgent requests through MyChart. Please call the office at [phone]. New and ongoing conditions may require a visit. We have virtual and in-person visits available for your convenience.  Complex MyChart concerns may require a visit. Your provider may request you schedule a virtual or in-person visit to ensure we are providing the best care possible. MyChart messages sent after 11:00 AM on Friday will not be received by the provider until Monday morning.    Lab and Test Results: You will receive your lab and test  results on MyChart as soon as they are completed and results have been sent by the lab or testing facility. Due to this service, you will receive your results BEFORE your provider.  I review lab and test results each morning prior to seeing patients. Some results require collaboration with other providers to ensure you are receiving the most appropriate care. For this reason, we ask that you please allow a minimum of 3-5 business days from the time that ALL results have been received for your provider to receive and review lab and test results and contact you about these.  Most lab and test result comments from the provider will be sent through MyChart. Your provider may recommend changes to the plan of care, follow-up visits, repeat testing, ask questions, or request an office visit to discuss these results. You may reply directly to this message or call the office to provide information for the provider or set up an appointment. In some instances, you will be called with test results and recommendations. Please let us know if this is preferred and we will make note of this in your chart to provide this for you.    If you have not heard a response to your lab or test results in 5 business days from all results returning to MyChart, please call the office to let us know. We ask that you please avoid calling prior to this time unless there is an emergent concern. Due to high call volumes, this can delay the resulting process.  After Hours: For all non-emergency after hours needs, please   call the office at [phone] and select the option to reach the on-call  service. On-call services are shared between multiple [facility] offices and therefore it will not be possible to speak directly with your provider. On-call providers may provide medical advice and recommendations, but are unable to provide refills for maintenance medications.  For all emergency or urgent medical needs after normal business hours, we  recommend that you seek care at the closest Urgent Care or Emergency Department to ensure appropriate treatment in a timely manner.  [facility] has a 24 hour emergency room located on the ground floor for your convenience.   Urgent Concerns During the Business Day Providers are seeing patients from 8AM to 5PM with a busy schedule and are most often not able to respond to non-urgent calls until the end of the day or the next business day. If you should have URGENT concerns during the day, please call and speak to the nurse or schedule a same day appointment so that we can address your concern without delay.   Thank you, again, for choosing me as your health care partner. I appreciate your trust and look forward to learning more about you!   Taylor B. Beck, DNP, FNP-C  ___________________________________________________________  Health Maintenance Recommendations Screening Testing Mammogram Every 1-2 years based on history and risk factors Starting at age 50 Pap Smear Ages 21-39 every 3 years Ages 30-65 every 5 years with HPV testing More frequent testing may be required based on results and history Colon Cancer Screening Every 1-10 years based on test performed, risk factors, and history Starting at age 45 Bone Density Screening Every 2-10 years based on history Starting at age 65 for women Recommendations for men differ based on medication usage, history, and risk factors AAA Screening One time ultrasound Men 65-75 years old who have ever smoked Lung Cancer Screening Low Dose Lung CT every 12 months Age 50-80 years with a 20 pack-year smoking history who still smoke or who have quit within the last 15 years  Screening Labs Routine  Labs: Complete Blood Count (CBC), Complete Metabolic Panel (CMP), Cholesterol (Lipid Panel) Every 6-12 months based on history and medications May be recommended more frequently based on current conditions or previous results Hemoglobin  A1c Lab Every 3-12 months based on history and previous results Starting at age 45 or earlier with diagnosis of diabetes, high cholesterol, BMI >26, and/or risk factors Frequent monitoring for patients with diabetes to ensure blood sugar control Thyroid Panel  Every 6 months based on history, symptoms, and risk factors May be repeated more often if on medication HIV One time testing for all patients 13 and older May be repeated more frequently for patients with increased risk factors or exposure Hepatitis C One time testing for all patients 18 and older May be repeated more frequently for patients with increased risk factors or exposure Gonorrhea, Chlamydia Every 12 months for all sexually active persons 13-24 years Additional monitoring may be recommended for those who are considered high risk or who have symptoms PSA Men 40-54 years old with risk factors Additional screening may be recommended from age 55-69 based on risk factors, symptoms, and history  Vaccine Recommendations Tetanus Booster All adults every 10 years Flu Vaccine All patients 6 months and older every year COVID Vaccine All patients 12 years and older Initial dosing with booster May recommend additional booster based on age and health history HPV Vaccine 2 doses all patients age 9-26 Dosing may be considered   for patients over 26 Shingles Vaccine (Shingrix) 2 doses all adults 50 years and older Pneumonia (Pneumovax 23) All adults 65 years and older May recommend earlier dosing based on health history Pneumonia (Prevnar 13) All adults 65 years and older Dosed 1 year after Pneumovax 23 Pneumonia (Prevnar 20) All adults 65 years and older (adults 19-64 with certain conditions or risk factors) 1 dose  For those who have not received Prevnar 13 vaccine previously   Additional Screening, Testing, and Vaccinations may be recommended on an individualized basis based on family history, health history, risk  factors, and/or exposure.  __________________________________________________________  Diet Recommendations for All Patients  I recommend that all patients maintain a diet low in saturated fats, carbohydrates, and cholesterol. While this can be challenging at first, it is not impossible and small changes can make big differences.  Things to try: Decreasing the amount of soda, sweet tea, and/or juice to one or less per day and replace with water While water is always the first choice, if you do not like water you may consider adding a water additive without sugar to improve the taste other sugar free drinks Replace potatoes with a brightly colored vegetable  Use healthy oils, such as canola oil or olive oil, instead of butter or hard margarine Limit your bread intake to two pieces or less a day Replace regular pasta with low carb pasta options Bake, broil, or grill foods instead of frying Monitor portion sizes  Eat smaller, more frequent meals throughout the day instead of large meals  An important thing to remember is, if you love foods that are not great for your health, you don't have to give them up completely. Instead, allow these foods to be a reward when you have done well. Allowing yourself to still have special treats every once in a while is a nice way to tell yourself thank you for working hard to keep yourself healthy.   Also remember that every day is a new day. If you have a bad day and "fall off the wagon", you can still climb right back up and keep moving along on your journey!  We have resources available to help you!  Some websites that may be helpful include: www.MyPlate.gov  Www.VeryWellFit.com _____________________________________________________________  Activity Recommendations for All Patients  I recommend that all adults get at least 20 minutes of moderate physical activity that elevates your heart rate at least 5 days out of the week.  Some examples  include: Walking or jogging at a pace that allows you to carry on a conversation Cycling (stationary bike or outdoors) Water aerobics Yoga Weight lifting Dancing If physical limitations prevent you from putting stress on your joints, exercise in a pool or seated in a chair are excellent options.  Do determine your MAXIMUM heart rate for activity: 220 - YOUR AGE = MAX Heart Rate   Remember! Do not push yourself too hard.  Start slowly and build up your pace, speed, weight, time in exercise, etc.  Allow your body to rest between exercise and get good sleep. You will need more water than normal when you are exerting yourself. Do not wait until you are thirsty to drink. Drink with a purpose of getting in at least 8, 8 ounce glasses of water a day plus more depending on how much you exercise and sweat.    If you begin to develop dizziness, chest pain, abdominal pain, jaw pain, shortness of breath, headache, vision changes, lightheadedness, or other concerning symptoms,   stop the activity and allow your body to rest. If your symptoms are severe, seek emergency evaluation immediately. If your symptoms are concerning, but not severe, please let us know so that we can recommend further evaluation.     

## 2023-07-08 NOTE — Assessment & Plan Note (Signed)
Well-managed with undetectable levels for the past 15 years. Currently on Biktarvy. -Continue current management with infectious disease specialist.

## 2023-07-09 ENCOUNTER — Telehealth: Payer: Self-pay

## 2023-07-09 DIAGNOSIS — Z87410 Personal history of cervical dysplasia: Secondary | ICD-10-CM | POA: Diagnosis not present

## 2023-07-09 DIAGNOSIS — Z01419 Encounter for gynecological examination (general) (routine) without abnormal findings: Secondary | ICD-10-CM | POA: Diagnosis not present

## 2023-07-09 DIAGNOSIS — R87610 Atypical squamous cells of undetermined significance on cytologic smear of cervix (ASC-US): Secondary | ICD-10-CM | POA: Diagnosis not present

## 2023-07-09 DIAGNOSIS — Z0000 Encounter for general adult medical examination without abnormal findings: Secondary | ICD-10-CM | POA: Diagnosis not present

## 2023-07-09 NOTE — Progress Notes (Signed)
   Care Guide Note  07/09/2023 Name: Renee Bryant MRN: [id number] DOB: 1981-02-06  Referred by: Clayborne Dana, NP Reason for referral : Care Management (Outreach to schedule with pharm d )   Renee Bryant is a 42 y.o. year old female who is a primary care patient of Clayborne Dana, NP. Renee Bryant was referred to the pharmacist for assistance related to DM.    Successful contact was made with the patient to discuss pharmacy services including being ready for the pharmacist to call at least 5 minutes before the scheduled appointment time, to have medication bottles and any blood sugar or blood pressure readings ready for review. The patient agreed to meet with the pharmacist via with the pharmacist via telephone visit on (date/time).  07/22/2023  Penne Lash, RMA Care Guide [facility]  [place], [place] [address] Direct Dial: [phone] [email or web address]

## 2023-07-10 MED ORDER — VITAMIN D (ERGOCALCIFEROL) 1.25 MG (50000 UNIT) PO CAPS
50000.0000 [IU] | ORAL_CAPSULE | ORAL | 0 refills | Status: DC
Start: 2023-07-10 — End: 2023-08-20

## 2023-07-10 NOTE — Addendum Note (Signed)
Addended by: Hyman Hopes B on: 07/10/2023 01:00 PM   Modules accepted: Orders

## 2023-07-11 ENCOUNTER — Encounter (INDEPENDENT_AMBULATORY_CARE_PROVIDER_SITE_OTHER): Payer: Self-pay

## 2023-07-15 NOTE — Progress Notes (Deleted)
Cardiology Clinic Note   Date: 07/15/2023 ID: Shakierra Strike, DOB January 13, 1981, MRN [id number]  Primary Cardiologist:  Reatha Harps, MD  Patient Profile    Renee Bryant is a 42 y.o. female who presents to the clinic today for ***    Past medical history significant for: CAD. LHC 05/03/2023 (NSTEMI): Proximal to mid LAD 40%.  D1 90%.  Recommend aggressive medical therapy and risk factor modification.  Given ostial location of diagonal stenosis PCI is less than ideal since this could lead to plaque shift/impingement of the LAD.  Ranexa added secondary to soft BP. HFmrEF. Echo 05/02/2023: Distal septal apical hypokinesis.  EF 45 to 50%.  RWMA.  Normal RV function.  Trivial MR.  Aortic valve sclerosis without stenosis. Hypertension. Hyperlipidemia. Lipid panel 05/03/2023: LDL 139, HDL 33, TG 208, total 214. LPa 05/03/2023: 16.6. IDDM. CKD stage IIIa. HIV. Dysfunctional uterine bleeding. Emphysema. Tobacco abuse.      History of Present Illness    Renee Bryant was first evaluated by Dr. Flora Lipps on 05/02/2023 for NSTEMI. Patient presented to the ED with a 1 day history of chest discomfort described as burning radiating to her left arm with associated nausea and vomiting as well as shortness of breath. Pepcid partially relieved symptoms. Upon arrival to [facility] she was noted to be significantly hyperglycemic. Troponin 113>> 146. She was transferred to [facility]. She underwent LHC which showed proximal to mid LAD 40%, D1 90% not amenable to PCI secondary to concern for plaque shift/impingement of the LAD. Aggressive medical therapy and risk factor modification was recommended.   Patient was seen for hospital follow-up 05/13/2023.  She was doing well with complaints of chronic lower extremity edema for which she requested restarting Lasix.  Secondary to CKD labs were drawn prior to restarting.  Kidney function was worse than baseline with creatinine 2.07.  She was instructed to  stop losartan and repeat BMP.  Creatinine was back to baseline at 1.45 on repeat.  Patient was last seen in the office by Edd Fabian, NP on 05/24/2023 for follow-up postmedication changes.  At the time of her visit her weight had increased from 197 pounds to 205 pounds.  She had right greater than left lower extremity edema on exam.  She reported following a low-sodium diet, reducing fluid, and elevating her feet.  She was restarted on Lasix 20 mg every other day and instructed to return for repeat BMP.  Patient presented to the ED on 06/08/2023 with complaints of abdominal pain x 3 days.  Labs were unremarkable.  It was felt patient having early flareup of diverticulitis given abdominal and rectal pain.  She was started on oral antibiotics and discharged.  She presented to the ED on 06/24/2023 with complaints of right lower abdominal pain and new onset chest pain radiating to right leg.  She reported chest pain similar to pain she had with MI.  Troponin was negative.  WBC elevated at 16.2.  She requested IV be removed as she wanted to go to a different [facility] facility and was subsequently seen leaving the ED.  Patient presented to the ED at [facility] on 06/26/2023 with complaints of continued abdominal pain.  CT abdomen pelvis showed fibroid uterus and right ovarian cyst but no acute localizing process in abdomen or pelvis.  Patient was discharged home.  Today, patient ***  Chantix?    CAD.  LHC June 2024 showed proximal to mid LAD 40%, D1 90% not amenable to  PCI secondary to concern for plaque shift/impingement of the LAD.  Patient***. Continue atorvastatin, aspirin, Plavix, metoprolol, Ranexa. HFmrEF.  Echo June 2024 showed EF 45 to 50% with normal RV function.  Patient reports chronic lower extremity edema managed with Lasix.  She*** Euvolemic and well compensated on exam.  Continue*** Hypertension. BP today***.  Patient denies headaches, dizziness or vision changes. Continue losartan,  metoprolol. Hyperlipidemia.  LDL June 2024 139, not at goal.  Continue atorvastatin.*** Tobacco abuse. Patient continues to smoke 1 to 1 1/12 packs of cigarettes a day. She is very interested in quitting. Will provide starter pack of Chantix for patient. She is going to find a new PCP to continue to manage Chantix.***   ROS: All other systems reviewed and are otherwise negative except as noted in History of Present Illness.  Studies Reviewed       ***  Risk Assessment/Calculations    {Does this patient have ATRIAL FIBRILLATION?[phone]} No BP recorded.  {Refresh Note OR Click here to enter BP  :1}***        Physical Exam    VS:  There were no vitals taken for this visit. , BMI There is no height or weight on file to calculate BMI.  GEN: Well nourished, well developed, in no acute distress. Neck: No JVD or carotid bruits. Cardiac: *** RRR. No murmurs. No rubs or gallops.   Respiratory:  Respirations regular and unlabored. Clear to auscultation without rales, wheezing or rhonchi. GI: Soft, nontender, nondistended. Extremities: Radials/DP/PT 2+ and equal bilaterally. No clubbing or cyanosis. No edema ***  Skin: Warm and dry, no rash. Neuro: Strength intact.  Assessment & Plan   ***  Disposition: ***     {Are you ordering a CV Procedure (e.g. stress test, cath, DCCV, TEE, etc)?   Press F2        :[id number]}   Signed, Etta Grandchild. Amisadai Woodford, DNP, NP-C

## 2023-07-18 ENCOUNTER — Ambulatory Visit: Payer: Medicaid Other | Admitting: Student

## 2023-07-22 ENCOUNTER — Ambulatory Visit (INDEPENDENT_AMBULATORY_CARE_PROVIDER_SITE_OTHER): Payer: Medicaid Other | Admitting: Pharmacist

## 2023-07-22 ENCOUNTER — Encounter: Payer: Medicaid Other | Admitting: Pharmacist

## 2023-07-22 ENCOUNTER — Other Ambulatory Visit: Payer: Self-pay | Admitting: Cardiovascular Disease

## 2023-07-22 DIAGNOSIS — E1165 Type 2 diabetes mellitus with hyperglycemia: Secondary | ICD-10-CM

## 2023-07-22 DIAGNOSIS — Z794 Long term (current) use of insulin: Secondary | ICD-10-CM

## 2023-07-22 MED ORDER — INSULIN LISPRO (1 UNIT DIAL) 100 UNIT/ML (KWIKPEN): 0.0000 [IU] | PEN_INJECTOR | Freq: Three times a day (TID) | SUBCUTANEOUS | 1 refills | Status: DC

## 2023-07-22 MED ORDER — FREESTYLE LIBRE 3 SENSOR MISC: 2 refills | Status: DC

## 2023-07-22 MED ORDER — "INSULIN SYRINGE-NEEDLE U-100 31G X 5/16"" 0.3 ML MISC": 0 refills | Status: DC

## 2023-07-22 MED ORDER — ALBUTEROL SULFATE HFA 108 (90 BASE) MCG/ACT IN AERS: 1.0000 | INHALATION_SPRAY | Freq: Four times a day (QID) | RESPIRATORY_TRACT | 0 refills | Status: AC | PRN

## 2023-07-22 MED ORDER — INSULIN PEN NEEDLE 32G X 4 MM MISC: 2 refills | Status: DC

## 2023-07-22 NOTE — Progress Notes (Signed)
07/22/2023 Name: Latania Lewers MRN: [id number] DOB: 12/21/1980  Chief Complaint  Patient presents with   Diabetes   Medication Management   Hyperlipidemia    Alura Santone is a 42 y.o. year old female who was referred for medication management by their primary care provider, Clayborne Dana, NP. They presented for a face to face visit today.   They were referred to the pharmacist by their PCP for assistance in managing diabetes, medication access, and complex medication management    Subjective:  Care Team: Primary Care Provider: Clayborne Dana, NP ; Next Scheduled Visit: 08/19/2023  Medication Access/Adherence  Current Pharmacy:  [id number] - [place], [place] - [address] [place] [address] Phone: [phone] Fax: [phone]  [id number] - [place], [place] - [address] AT [facility] [address] [place] [address] Phone: [phone] Fax: [phone]  [facility] #[id number] - [place], [place] - [address] AT [address] [place] [address] Phone: [phone] Fax: [phone]  [facility] [address] [place] [address] Phone: [phone] Fax: [phone]   Patient reports affordability concerns with their medications: No  Patient reports access/transportation concerns to their pharmacy: No  Patient reports adherence concerns with their medications:  Yes  previously was having trouble getting updated prescriptions prior PCP officec   Diabetes: Patient states she was initially diagnosed with Type 2 DM when she asd 42 yo. She lost from 298 to 160 lbs and was able to stop taking any medication for diabetes. Then blood glucose increased again at 42 yo. She has tried several different medications in the past but feels that insulin works best to get her blood glucose down without  causing side effect.   Current medications: Lantus 18 units daily:  Humalog - 3 times a day prior to meals based on sliding scale. Blood Glucose <150= 0 unit; BG 150-200= 1 unit; BG 201-250= 3 unit; BG 251-300= 5 unit; BG 301-350= 7 unit; BG 351-400= 9 unit; BG >400= 11 unit and Call Primary Care   Medications tried in the past: Trulicity and Ozempic - nausea; Jardiance, Farxiga, metformin - Nausea.   Current glucose readings: 100's - reports she has had some readings recently that were low.  testing 3 to 4 times daily She was provided Continuous Glucose Monitor after a hospitalization this year but the sensor malfunctioned and she was not really sure how to use the Continuous Glucose Monitor>   Patient reports hypoglycemic s/sx including dizziness, shakiness, sweating.  Patient denies hyperglycemic symptoms including no polyuria, polydipsia, polyphagia, nocturia, neuropathy, blurred vision.  Hypertension / CHF - HFmrEF  Current medications:  metoprolol ER 25mg  daily  Furosemide 20mg  every other day as needed for edema.   Medications previously tried: Losartan stopped 6/17 by cardiology  Patient denies hypotensive s/sx including no dizziness, lightheadedness.  Patient denies hypertensive symptoms including no headache, chest pain, shortness of breath  Current smoker - tried Chantix recently but was not able to tolerate. \  Hyperlipidemia/ASCVD Risk Reduction  Current lipid lowering medications: none Medications tried in the past: atorvastatin - listed as stopped 8/12 due to change in therapy   Antiplatelet regimen: aspirin 81mg  daily and clopidogrel 75mg  daily   ASCVD History: NSTEMI (04/2023); noted to have proximal to mid LAD 40%, D1 90% not  amenable to PCI secondary to concern for plaque shift/impingement of the LAD.   Risk Factors: CAD, type 2 DM, hypertension, smoker   Objective:  Lab Results  Component Value Date   HGBA1C 13.1 (H) 05/02/2023    Lab Results   Component Value Date   CREATININE 1.74 (H) 07/08/2023   BUN 28 (H) 07/08/2023   NA 134 (L) 07/08/2023   K 4.3 07/08/2023   CL 101 07/08/2023   CO2 27 07/08/2023    Lab Results  Component Value Date   CHOL 214 (H) 05/03/2023   HDL 33 (L) 05/03/2023   LDLCALC 139 (H) 05/03/2023   TRIG 208 (H) 05/03/2023   CHOLHDL 6.5 05/03/2023    Medications Reviewed Today   Medications were not reviewed in this encounter       Assessment/Plan:   Diabetes: Last A1c was not at goal but recent blood glucose has improved.  - Reviewed long term cardiovascular and renal outcomes of uncontrolled blood sugar - Reviewed goal A1c, goal fasting, and goal 2 hour post prandial glucose - Recommend to continue Lantus and Humalog at current dose. Patient will finish using current vial of Lantus. Plan to switch to Lantus Solostar pens next month.   - Started Continuous Glucose Monitor - Freestyle Libre 3 today. Patient received the following instruction for Freestyle Libre 3 Personal CGM:   - preparation of placement site - clean with alcohol and allow to dry.  Sensor is to only be place on back of upper arm.  Patient to rotate sides and site.   -care of sensor and site   - reminded that sensor is waterproof up to 3 feet and for 30 minutes.   - Assisted in downloading [place] 3 app to her phone and with app / account set up.   - reviewed Libre 3 app home screen and how to read and respond to trend arrows.   - reminded that when magnifying glass symbols shows, she is to confirm BG with finger stick before making any treatment decisions. .   - pt signed up for libre view in office. Linked with PCP office.   - Recommend to check glucose prior to meals and as needed with Continuous Glucose Monitor.     Medication Management: - Patient needed several medication refilled. Coordinated with pharmacy to request refills for clopidogrel, metoprolol and ranolazine form cardiology office.  - Refilled medications  below.   Meds ordered this encounter  Medications   Continuous Glucose Sensor (FREESTYLE LIBRE 3 SENSOR) MISC    Sig: Use to check blood glucose continuously.    Dispense:  2 each    Refill:  2   albuterol (VENTOLIN HFA) 108 (90 Base) MCG/ACT inhaler    Sig: Inhale 1-2 puffs into the lungs every 6 (six) hours as needed for wheezing or shortness of breath.    Dispense:  1 each    Refill:  0   insulin lispro (HUMALOG) 100 UNIT/ML KwikPen    Sig: Inject 0-11 Units into the skin 3 (three) times daily with meals based on sliding scale: Blood Glucose <150= 0 unit; BG 150-200= 1 unit; BG 201-250= 3 unit; BG 251-300= 5 unit; BG 301-350= 7 unit; BG 351-400= 9 unit; BG >400= 11 unit and Call Primary Care.    Dispense:  15 mL    Refill:  1    ok to change to kwikpen per MD/maf   Insulin Pen Needle 32G X 4 MM MISC    Sig: Use 3  times a day with Humalog Kwik pen    Dispense:  100 each    Refill:  2    with humalog kwikpen and Glargine per MD/maf   Insulin Syringe-Needle U-100 (GNP INSULIN SYRINGES 31GX5/16") 31G X 5/16" 0.3 ML MISC    Sig: Use to inject Lantus once a day    Dispense:  100 each    Refill:  0     Follow Up Plan: 1 week - check on Continuous Glucose Monitor/ discuss statin  Henrene Pastor, PharmD Clinical Pharmacist  Primary Care SW [facility]

## 2023-07-22 NOTE — Progress Notes (Unsigned)
   07/22/2023 Name: Renee Bryant MRN: [id number] DOB: Jan 17, 1981  No chief complaint on file.   Renee Bryant is a 42 y.o. year old female who presented for a telephone visit.   They were referred to the pharmacist by their PCP for assistance in managing diabetes, hypertension, hyperlipidemia, and complex medication management.    Subjective:  Medication Access/Adherence  Current Pharmacy:  [facility] - [place], [place] - [address] [place] [address] Phone: [phone] Fax: [phone]  [facility] #[id number] - [place], [place] - [address] AT [facility] [address] [place] [address] Phone: [phone] Fax: [phone]  [facility] #[id number] - [place], [place] - [address] [place] [address] Phone: [phone] Fax: [phone]  [facility] [address] [place] [address] Phone: [phone] Fax: [phone]   Patient reports affordability concerns with their medications: No  Patient reports access/transportation concerns to their pharmacy: No  Patient reports adherence concerns with their medications:  Yes  had difficulty   {Pharmacy S/O Choices:26420}   Objective:  Lab Results  Component Value Date   HGBA1C 13.1 (H) 05/02/2023    Lab Results  Component Value Date   CREATININE 1.74 (H) 07/08/2023   BUN 28 (H) 07/08/2023   NA 134 (L) 07/08/2023   K 4.3 07/08/2023   CL 101 07/08/2023   CO2 27 07/08/2023    Lab Results  Component Value Date   CHOL 214 (H) 05/03/2023   HDL 33 (L) 05/03/2023   LDLCALC 139 (H) 05/03/2023   TRIG 208 (H) 05/03/2023   CHOLHDL 6.5 05/03/2023    Medications Reviewed Today   Medications were not reviewed in this encounter       Assessment/Plan:   {Pharmacy A/P Choices:26421}  Follow Up Plan: ***  ***

## 2023-07-23 ENCOUNTER — Telehealth: Payer: Self-pay | Admitting: Pharmacist

## 2023-07-23 ENCOUNTER — Other Ambulatory Visit: Payer: Medicaid Other | Admitting: Pharmacist

## 2023-07-23 DIAGNOSIS — Z0000 Encounter for general adult medical examination without abnormal findings: Secondary | ICD-10-CM | POA: Diagnosis not present

## 2023-07-23 DIAGNOSIS — E1165 Type 2 diabetes mellitus with hyperglycemia: Secondary | ICD-10-CM | POA: Diagnosis not present

## 2023-07-23 NOTE — Telephone Encounter (Signed)
Patient called to report she picked up her prescription that were called in yesterday but [facility] did not fill insulin pen needles. From our end it looks like they were sent electronically with others.   Called [facility] and they verified that they received Rx for insulin pen needles but must have been missed. They will work on now.  [facility] also mentioned that [employer] will need prior authorization. Will forward request to our prior authorization team.

## 2023-07-28 DIAGNOSIS — Z419 Encounter for procedure for purposes other than remedying health state, unspecified: Secondary | ICD-10-CM | POA: Diagnosis not present

## 2023-07-30 ENCOUNTER — Other Ambulatory Visit: Payer: Medicaid Other | Admitting: Pharmacist

## 2023-07-30 MED ORDER — ATORVASTATIN CALCIUM 80 MG PO TABS: 80.0000 mg | ORAL_TABLET | Freq: Every day | ORAL | 1 refills | Status: DC

## 2023-07-30 NOTE — Progress Notes (Signed)
07/30/2023 Name: Renee Bryant MRN: [id number] DOB: 04-01-1981  Chief Complaint  Patient presents with   Diabetes   Hyperlipidemia    Renee Bryant is a 42 y.o. year old female who was referred for medication management by their primary care provider, Clayborne Dana, NP. They presented for a face to face visit today.   They were referred to the pharmacist by their PCP for assistance in managing diabetes, medication access, and complex medication management    Subjective:  Care Team: Primary Care Provider: Clayborne Dana, NP ; Next Scheduled Visit: 08/19/2023  Medication Access/Adherence Patient reports affordability concerns with their medications: No  Patient reports access/transportation concerns to their pharmacy: No  Patient reports adherence concerns with their medications:  Yes  previously was having trouble getting updated prescriptions prior PCP officec   Diabetes: Patient states she was initially diagnosed with Type 2 DM when she was 42 yo. She lost from 298 to 160 lbs and was able to stop taking any medication for diabetes. Then blood glucose increased again at 42 yo. She has tried several different medications in the past but feels that insulin works best to get her blood glucose down without causing side effect.   Current medications: Lantus 18 units daily:  Humalog - patient is using 11 units 3 times a day prior to each meal + sliding scale below.   Sliding scale. Blood Glucose <150= 0 unit; BG 150-200= 1 unit; BG 201-250= 3 unit; BG 251-300= 5 unit; BG 301-350= 7 unit; BG 351-400= 9 unit; BG >400= 11 unit and Call Primary Care   Medications tried in the past: Trulicity and Ozempic - nausea; Jardiance, Farxiga, metformin - Nausea.   Patient started [facility] 3 Continuous Glucose Monitor last week. Today she reports sensor fell off yesterday.        Current glucose readings: 100's - reports she has had some readings recently that were low.  testing 3 to 4 times  daily She was provided Continuous Glucose Monitor after a hospitalization this year but the sensor malfunctioned and she was not really sure how to use the Continuous Glucose Monitor>   Patient reports hypoglycemic s/sx including dizziness, shakiness, sweating.  Patient denies hyperglycemic symptoms including no polyuria, polydipsia, polyphagia, nocturia, neuropathy, blurred vision.  Hypertension / CHF - HFmrEF  Current medications:  metoprolol ER 25mg  daily  Furosemide 20mg  every other day as needed for edema - patient has not been taking due to concern for kidney function but today she reports increase in weight and edema.   Medications previously tried: Losartan stopped 6/17 by cardiology  Current smoker - tried Chantix recently but was not able to tolerate.  Hyperlipidemia/ASCVD Risk Reduction  Current lipid lowering medications: atorvastatin 80mg  daily - started during hospitalization Medications tried in the past: atorvastatin - listed as stopped 8/12 due to change in therapy   Antiplatelet regimen: aspirin 81mg  daily and clopidogrel 75mg  daily   ASCVD History: NSTEMI (04/2023); noted to have proximal to mid LAD 40%, D1 90% not amenable to PCI secondary to concern for plaque shift/impingement of the LAD.   Risk Factors: CAD, type 2 DM, hypertension, smoker   Objective:  Lab Results  Component Value Date   HGBA1C 13.1 (H) 05/02/2023    Lab Results  Component Value Date   CREATININE 1.74 (H) 07/08/2023   BUN 28 (H) 07/08/2023   NA 134 (L) 07/08/2023   K 4.3 07/08/2023   CL 101 07/08/2023   CO2 27 07/08/2023  Lab Results  Component Value Date   CHOL 214 (H) 05/03/2023   HDL 33 (L) 05/03/2023   LDLCALC 139 (H) 05/03/2023   TRIG 208 (H) 05/03/2023   CHOLHDL 6.5 05/03/2023    Medications Reviewed Today   Medications were not reviewed in this encounter       Assessment/Plan:   Diabetes: Last A1c was not at goal but recent blood glucose has improved.  -  Reviewed long term cardiovascular and renal outcomes of uncontrolled blood sugar - Reviewed goal A1c, goal fasting, and goal 2 hour post prandial glucose - Recommend to increase Lantus to 25 units at bedtime, increase by 2 units every 2 days until FBG is < 150.  - Changed mealtime Humalog to 7 units prior to each meal + additional insulin for correction based on blood glucose and sliding scale.  Sliding scale. Blood Glucose <150= 0 unit; BG 150-200= 1 unit; BG 201-250= 3 unit; BG 251-300= 5 unit; BG 301-350= 7 unit; BG 351-400= 9 unit; BG >400= 11 unit and Call Primary Care  - Will leave Continuous Glucose Monitor sample of Libre 3 for patient. Checked on Continuous Glucose Monitor prior authorization with [facility] but no prior authorization approved yet. Resent prior authorization Key # [id number] - Recommend to check glucose prior to meals and as needed with Continuous Glucose Monitor.     Medication Management:  - Refilled medications below.  - recommended patient restart fuorsemide 20mg  - take 0.5 tablet every other day per directions from PCP.   Meds ordered this encounter  Medications   atorvastatin (LIPITOR) 80 MG tablet    Sig: Take 1 tablet (80 mg total) by mouth daily.    Dispense:  90 tablet    Refill:  1    Patient would like to get blood pressure cuff, scale and pulse ox machine - checking with her insurance about coverage.  Follow Up Plan: 1 week - check on Continuous Glucose Monitor/ discuss statin  Henrene Pastor, PharmD Clinical Pharmacist [facility]

## 2023-07-30 NOTE — Patient Instructions (Signed)
Increase Lantus to 25 units at bedtime, increase by 2 units every 2 days until FBG is < 150.   Changed mealtime Humalog to 7 units prior to each meal + additional insulin for correction based on blood glucose and sliding scale.   Sliding scale. Blood Glucose <150= 0 unit; BG 150-200= 1 unit; BG 201-250= 3 unit; BG 251-300= 5 unit; BG 301-350= 7 unit; BG 351-400= 9 unit; BG >400= 11 unit and Call Primary Care      Restart furosemide 20mg  - take 0.5 tablet every other day per directions from PCP.

## 2023-08-02 ENCOUNTER — Encounter: Payer: Self-pay | Admitting: Pharmacist

## 2023-08-02 ENCOUNTER — Telehealth: Payer: Self-pay | Admitting: Family Medicine

## 2023-08-02 ENCOUNTER — Other Ambulatory Visit: Payer: Self-pay | Admitting: Cardiovascular Disease

## 2023-08-02 MED ORDER — RANOLAZINE ER 500 MG PO TB12: 500.0000 mg | ORAL_TABLET | Freq: Two times a day (BID) | ORAL | 0 refills | Status: DC

## 2023-08-02 NOTE — Telephone Encounter (Signed)
Pt would like pcp to take over prescriptions given at the hospital.   ranolazine (RANEXA) 500 MG 12 hr tablet  Also once she is due for a refill she would like pcp to take over plavix and metoprolol.   [facility] #[id number] - [place], [place] - [address] AT [facility] [address], [place]  [address] Phone: [phone]  Fax: [phone]

## 2023-08-02 NOTE — Progress Notes (Signed)
08/02/2023 Name: Renee Bryant MRN: [id number] DOB: [birth year]  Chief Complaint  Patient presents with   Diabetes    Renee Bryant is a 42 y.o. year old female who was referred for medication management by their primary care provider, Clayborne Dana, NP. They presented for a face to face visit today.   They were referred to the pharmacist by their PCP for assistance in managing diabetes, medication access, and complex medication management    Subjective:  Care Team: Primary Care Provider: Clayborne Dana, NP ; Next Scheduled Visit: 08/19/2023  Medication Access/Adherence Patient reports affordability concerns with their medications: No  Patient reports access/transportation concerns to their pharmacy: No  Patient reports adherence concerns with their medications:  Yes  previously was having trouble getting updated prescriptions prior PCP officec   Diabetes: Patient was initially diagnosed with Type 2 DM when she was 42 yo. Per patient she lost from 298 to 160 lbs and was able to stop taking any medication for diabetes. Then blood glucose increased again at 42 yo. She has tried several different medications in the past but feels that insulin works best to get her blood glucose down without causing side effect.   Current medications: Lantus 25 units daily: (increaesd from 18 units 07/30/2023) Humalog - patient is using 7 units 3 times a day prior to each meal + sliding scale below. (Decreased form 11 units 07/30/2023)  Sliding scale. Blood Glucose <150= 0 unit; BG 150-200= 1 unit; BG 201-250= 3 unit; BG 251-300= 5 unit; BG 301-350= 7 unit; BG 351-400= 9 unit; BG >400= 11 unit and Call Primary Care   Medications tried in the past: Trulicity and Ozempic - nausea; Jardiance, Farxiga, metformin - Nausea.   Patient called in today stating the she felt that her blood glucose was low - feeling shaky and sweaty but blood glucose was only in the 80's  Using Libre 3 Continuous Glucose  Monitor - prior authorization has been approved.               Patient reports hypoglycemic s/sx including dizziness, shakiness, sweating.  Patient denies hyperglycemic symptoms including no polyuria, polydipsia, polyphagia, nocturia, neuropathy, blurred vision.    Objective:  Lab Results  Component Value Date   HGBA1C 13.1 (H) 05/02/2023    Lab Results  Component Value Date   CREATININE 1.74 (H) 07/08/2023   BUN 28 (H) 07/08/2023   NA 134 (L) 07/08/2023   K 4.3 07/08/2023   CL 101 07/08/2023   CO2 27 07/08/2023    Lab Results  Component Value Date   CHOL 214 (H) 05/03/2023   HDL 33 (L) 05/03/2023   LDLCALC 139 (H) 05/03/2023   TRIG 208 (H) 05/03/2023   CHOLHDL 6.5 05/03/2023    Medications Reviewed Today   Medications were not reviewed in this encounter       Assessment/Plan:   Diabetes: Last A1c was not at goal but recent blood glucose has improved.  - Reviewed long term cardiovascular and renal outcomes of uncontrolled blood sugar - Reviewed goal A1c, goal fasting, and goal 2 hour post prandial glucose - Recommend to increase Lantus to 30 units at bedtime.    - Changed mealtime Humalog to 5 units prior to each meal + additional insulin for correction based on blood glucose and sliding scale.  Sliding scale. Blood Glucose <150= 0 unit; BG 150-200= 1 unit; BG 201-250= 3 unit; BG 251-300= 5 unit; BG 301-350= 7 unit; BG 351-400= 9 unit; BG >400=  11 unit and Call Primary Care  - Recommend to check glucose prior to meals and as needed with Continuous Glucose Monitor. Patient is aware the prior authorization has been approved.     Medication Management:  - Patient has RFs on file at [facility] for metoprolol and clopidogrel from cardiologist.  - Asked [facility] to send request to cardio office for ranolazine Rx .   Follow Up Plan: 1 week - check on Continuous Glucose Monitor/ discuss statin  Henrene Pastor, PharmD Clinical Pharmacist [facility]

## 2023-08-02 NOTE — Telephone Encounter (Signed)
Called and spoke with patient. Per her last visit note she is to follow up with Cardiology about medications. She does have a visit scheduled at the end of the month. She just needs Ranexa to be sent for the month until she can establish with Cardiologist. Andrew Au.

## 2023-08-06 ENCOUNTER — Other Ambulatory Visit: Payer: Medicaid Other | Admitting: Pharmacist

## 2023-08-06 NOTE — Progress Notes (Signed)
08/06/2023 Name: Renee Bryant MRN: [id number] DOB: Nov 14, 1981  No chief complaint on file.   Renee Bryant is a 42 y.o. year old female who was referred for medication management by their primary care provider, Clayborne Dana, NP. They presented for a face to face visit today.   They were referred to the pharmacist by their PCP for assistance in managing diabetes, medication access, and complex medication management    Subjective:  Care Team: Primary Care Provider: Clayborne Dana, NP ; Next Scheduled Visit: 08/19/2023  Medication Access/Adherence Patient reports affordability concerns with their medications: No  Patient reports access/transportation concerns to their pharmacy: No  Patient reports adherence concerns with their medications:  Yes  previously was having trouble getting updated prescriptions prior PCP officec  Hypertension:  Current medications: metoprolol ER 25mg  daily  Medications previously tried:  losartan - stopped due to increase in Scr.   Patient has a validated, automated, upper arm home BP cuff Current blood pressure readings readings: checked today and blood pressure was 194/98 and HR 109 Patient also takes furosemide 20mg  - 0.5 tablet = 10mg  every OTHER day. She reports that edema has been worse of the last week.    Diabetes: Patient was initially diagnosed with Type 2 DM when she was 42 yo. Per patient she lost from 298 to 160 lbs and was able to stop taking any medication for diabetes. Then blood glucose increased again at 42 yo. She has tried several different medications in the past but feels that insulin works best to get her blood glucose down without causing side effect.   Current medications: Lantus 30 units daily: (increaesd from 25 units 08/02/2023) Humalog - patient is using 5 units 3 times a day prior to each meal + sliding scale below. (Decreased form 5 units 08/02/2023 and from 11 units 07/30/2023)  Sliding scale. Blood Glucose <150= 0  unit; BG 150-200= 1 unit; BG 201-250= 3 unit; BG 251-300= 5 unit; BG 301-350= 7 unit; BG 351-400= 9 unit; BG >400= 11 unit and Call Primary Care   Medications tried in the past: Trulicity - caused skin rash after taking for about 6 months; Ozempic - nausea and weakness; Jardiance - caused yeast infection, Farxiga - cannot remember why she stopped, metformin - Nausea / diarrhea  Using Libre 3 Continuous Glucose Monitor  CGM Documentation:  % Time CGM is active: 77% (goal ?70%) Average Glucose: 223 mg/dL Glucose Management Indicator: 8.6% Glucose Variability: 34.5% (goal <36%) Time in Range:  - Time above range >250: 37% (typical goal: <5%) - Time above range 181-250: 35% (typical goal <20%) - Time in range 70-180 28% (typical goal ?70%) - Time below range 54-69: 0% (typical goal <4%) - Time below range: 0% (typical goal <1%)        Patient reports occasional hypoglycemic s/sx including dizziness, shakiness, sweating. Usually occurs after noon meal and evening meal Patient reports hyperglycemic symptoms including blurry vision. But no polyuria, polydipsia, polyphagia, nocturia, neuropathy.   Objective:  Lab Results  Component Value Date   HGBA1C 13.1 (H) 05/02/2023    Lab Results  Component Value Date   CREATININE 1.74 (H) 07/08/2023   BUN 28 (H) 07/08/2023   NA 134 (L) 07/08/2023   K 4.3 07/08/2023   CL 101 07/08/2023   CO2 27 07/08/2023    Lab Results  Component Value Date   CHOL 214 (H) 05/03/2023   HDL 33 (L) 05/03/2023   LDLCALC 139 (H) 05/03/2023   TRIG 208 (  H) 05/03/2023   CHOLHDL 6.5 05/03/2023    Medications Reviewed Today   Medications were not reviewed in this encounter       Assessment/Plan:   Diabetes: Last A1c was not at goal but recent blood glucose has improved.  - Reviewed lCGM report. Blood glucose improved but not at goals.  - Reviewed goal A1c, goal fasting, and goal 2 hour post prandial glucose - Recommend to increase Lantus to 32  units at bedtime.    - Changed mealtime Humalog to 3 units prior to each meal + additional insulin for correction based on blood glucose and sliding scale.  Sliding scale. Blood Glucose <150= 0 unit; BG 150-200= 1 unit; BG 201-250= 3 unit; BG 251-300= 5 unit; BG 301-350= 7 unit; BG 351-400= 9 unit; BG >400= 11 unit and Call Primary Care  - Recommend to check glucose prior to meals and as needed with Continuous Glucose Monitor.  - could consider recheck C-Peptide and if WNL maybe consider retrial of either SGLT2 or GLP1 / Mounjaro due to CVD benefits.    Hypertension:  - Recommended she increase metoprolol to take 2 tablets of 25mg  = 50mg  daily.  - Take furosemide 20mg  - 0.5 tablet = 10mg  daily for the next 3 days.   Follow Up Plan: OV tomorrow - check blood pressure in office with office cuff and patient's home cuff.   Henrene Pastor, PharmD Clinical Pharmacist [facility]

## 2023-08-07 ENCOUNTER — Ambulatory Visit (INDEPENDENT_AMBULATORY_CARE_PROVIDER_SITE_OTHER): Payer: Medicaid Other

## 2023-08-07 VITALS — BP 156/87 | HR 70

## 2023-08-07 DIAGNOSIS — E1165 Type 2 diabetes mellitus with hyperglycemia: Secondary | ICD-10-CM

## 2023-08-07 DIAGNOSIS — Z794 Long term (current) use of insulin: Secondary | ICD-10-CM

## 2023-08-07 DIAGNOSIS — I10 Essential (primary) hypertension: Secondary | ICD-10-CM

## 2023-08-07 MED ORDER — FUROSEMIDE 20 MG PO TABS: 10.0000 mg | ORAL_TABLET | ORAL | 0 refills | Status: DC

## 2023-08-07 MED ORDER — METOPROLOL SUCCINATE ER 50 MG PO TB24: 50.0000 mg | ORAL_TABLET | Freq: Every day | ORAL | 0 refills | Status: DC

## 2023-08-07 NOTE — Addendum Note (Signed)
Addended by: Henrene Pastor B on: 08/07/2023 04:49 PM   Modules accepted: Orders

## 2023-08-07 NOTE — Progress Notes (Signed)
08/07/2023 Name: Renee Bryant MRN: [id number] DOB: September 01, 1981  No chief complaint on file.   Renee Bryant is a 42 y.o. year old female who was referred for medication management by their primary care provider, Clayborne Dana, NP. They presented for a face to face visit today.   They were referred to the pharmacist by their PCP for assistance in managing diabetes, medication access, and complex medication management    Subjective:  Care Team: Primary Care Provider: Clayborne Dana, NP ; Next Scheduled Visit: 08/19/2023  Medication Access/Adherence Patient reports affordability concerns with their medications: No  Patient reports access/transportation concerns to their pharmacy: No  Patient reports adherence concerns with their medications:  Yes  previously was having trouble getting updated prescriptions prior PCP officec  Hypertension:  Current medications: metoprolol ER 25mg  daily - she took 2 tablets this morning and yesterday due to elevated blood pressure.  Medications previously tried:  losartan - stopped due to increase in Scr.   Patient has a validated, automated, wrist blood pressure cuff at home. Yesterday blood pressure was 194/98 with her monitor and HR was 109 Today checked blood pressure in office with her monitor and was 173/98 and HR 68  BP Readings from Last 3 Encounters:  08/07/23 (!) 156/87  07/08/23 129/82  06/26/23 (!) 142/91    Noted to have 1+ pitting edema of both lower extremities. She is taking furosemide 20mg  - 0.5 tablet = 10mg  every OTHER day.    Diabetes: Patient was initially diagnosed with Type 2 DM when she was 42 yo. Per patient she lost from 298 to 160 lbs and was able to stop taking any medication for diabetes. Then blood glucose increased again at 42 yo. She has tried several different medications in the past but feels that insulin works best to get her blood glucose down without causing side effect.   Current medications: Lantus 32  units daily: (increaesd from 25 units 08/02/2023) Humalog - patient is using 3 units 3 times a day prior to each meal + sliding scale below. (Decreased form 5 units 08/02/2023 and from 11 units 07/30/2023)  Sliding scale. Blood Glucose <150= 0 unit; BG 150-200= 1 unit; BG 201-250= 3 unit; BG 251-300= 5 unit; BG 301-350= 7 unit; BG 351-400= 9 unit; BG >400= 11 unit and Call Primary Care   Medications tried in the past: Trulicity - caused skin rash after taking for about 6 months; Ozempic - nausea and weakness; Jardiance - caused yeast infection, Farxiga - cannot remember why she stopped, metformin - Nausea / diarrhea  Using Libre 3 Continuous Glucose Monitor - See yesterday's note for most recent Continuous Glucose Monitor report.   Patient reports occasional hypoglycemic s/sx including dizziness, shakiness, sweating. Usually occurs after noon meal and evening meal Patient reports hyperglycemic symptoms including blurry vision. But no polyuria, polydipsia, polyphagia, nocturia, neuropathy.   Objective:  Lab Results  Component Value Date   HGBA1C 13.1 (H) 05/02/2023    Lab Results  Component Value Date   CREATININE 1.74 (H) 07/08/2023   BUN 28 (H) 07/08/2023   NA 134 (L) 07/08/2023   K 4.3 07/08/2023   CL 101 07/08/2023   CO2 27 07/08/2023    Lab Results  Component Value Date   CHOL 214 (H) 05/03/2023   HDL 33 (L) 05/03/2023   LDLCALC 139 (H) 05/03/2023   TRIG 208 (H) 05/03/2023   CHOLHDL 6.5 05/03/2023    Medications Reviewed Today     Reviewed by Clydie Braun,  Moranda Billiot, RPH-CPP (Pharmacist) on 08/07/23 at 1255  Med List Status: <None>   Medication Order Taking? Sig Documenting Provider Last Dose Status Informant  albuterol (VENTOLIN HFA) 108 (90 Base) MCG/ACT inhaler [id number] Yes Inhale 1-2 puffs into the lungs every 6 (six) hours as needed for wheezing or shortness of breath. Wanda Plump, MD Taking Active   aspirin 81 MG chewable tablet [id number] Yes Chew 1 tablet (81 mg total)  by mouth daily. Alberteen Sam, MD Taking Active   atorvastatin (LIPITOR) 80 MG tablet [id number] Yes Take 1 tablet (80 mg total) by mouth daily. Bradd Canary, MD Taking Active   bictegravir-emtricitabine-tenofovir AF (BIKTARVY) 50-200-25 MG TABS tablet [id number] Yes Take by mouth daily. [provider] Taking Active Self  clonazePAM (KLONOPIN) 0.5 MG tablet [id number] Yes Take 1 mg by mouth 2 (two) times daily as needed for anxiety. [provider] Taking Active Self  clopidogrel (PLAVIX) 75 MG tablet [id number] Yes TAKE 1 TABLET(75 MG) BY MOUTH DAILY WITH BREAKFAST O'Neal, Ronnald Ramp, MD Taking Active   Continuous Glucose Sensor (FREESTYLE LIBRE 3 SENSOR) [place] [id number] Yes Use to check blood glucose continuously. Wanda Plump, MD Taking Active   furosemide (LASIX) 20 MG tablet [id number] Yes Take 1 tablet (20 mg total) by mouth every other day.  Patient taking differently: Take 10 mg by mouth every other day.   Ronney Asters, NP Taking Active   insulin glargine (LANTUS) 100 UNIT/ML injection [id number] Yes Inject 30 Units into the skin at bedtime. [provider] Taking Active Self  insulin lispro (HUMALOG) 100 UNIT/ML KwikPen [id number] Yes Inject 0-11 Units into the skin 3 (three) times daily with meals based on sliding scale: Blood Glucose <150= 0 unit; BG 150-200= 1 unit; BG 201-250= 3 unit; BG 251-300= 5 unit; BG 301-350= 7 unit; BG 351-400= 9 unit; BG >400= 11 unit and Call Primary Care.  Patient taking differently: Inject 5 Units into the skin 3 (three) times daily before meals.   Wanda Plump, MD Taking Active   Insulin Pen Needle 32G X 4 MM MISC [id number] Yes Use 3 times a day with Humalog Kwik pen Wanda Plump, MD Taking Active   Insulin Syringe-Needle U-100 [facility] INSULIN SYRINGES 31GX5/16") 31G X 5/16" 0.3 ML MISC [id number]  Use to inject Lantus once a day Wanda Plump, MD  Active   metoprolol succinate (TOPROL-XL) 25 MG 24 hr tablet [id number]  TAKE 1  TABLET(25 MG) BY MOUTH DAILY  Patient taking differently: 50 mg.   Sande Rives, MD  Active   ondansetron (ZOFRAN-ODT) 4 MG disintegrating tablet [id number]  Take 1 tablet (4 mg total) by mouth every 8 (eight) hours as needed for nausea or vomiting. Renne Crigler, PA-C  Active   ranolazine (RANEXA) 500 MG 12 hr tablet [id number] Yes Take 1 tablet (500 mg total) by mouth 2 (two) times daily. Clayborne Dana, NP Taking Active   valACYclovir (VALTREX) 500 MG tablet [id number] Yes Take 500 mg by mouth 2 (two) times daily as needed (For cold sores). [provider] Taking Active Self  Vitamin D, Ergocalciferol, (DRISDOL) 1.25 MG (50000 UNIT) CAPS capsule [id number]  Take 1 capsule (50,000 Units total) by mouth every 7 (seven) days. Clayborne Dana, NP  Active               Assessment/Plan:   Diabetes: Last A1c was not at goal but recent blood glucose has improved.  - Reviewed CGM report. Blood glucose improved  but not at goals.  - Reviewed goal A1c, goal fasting, and goal 2 hour post prandial glucose - Recommend continue Lantus to 32 units at bedtime and mealtime Humalog 3 to 5 units prior to each meal + additional insulin for correction based on blood glucose and sliding scale.  Sliding scale. Blood Glucose <150= 0 unit; BG 150-200= 1 unit; BG 201-250= 3 unit; BG 251-300= 5 unit; BG 301-350= 7 unit; BG 351-400= 9 unit; BG >400= 11 unit and Call Primary Care  - Recommend to check glucose prior to meals and as needed with Continuous Glucose Monitor.  - could consider recheck C-Peptide and if WNL maybe consider retrial of either SGLT2 or GLP1 / Mounjaro due to CVD benefits.    Hypertension:  - Continue to take metoprolol 50mg  daily.  Take 2 tabs of 25mg  until completes current Rx then change to 50mg  tablets. Sent in new Rx for 50mg  dose of metoprolol (has only been at higher dose for 24 hours and blood pressure has improved)  - Take furosemide 20mg  - 0.5 tablet = 10mg  daily for the  next 3 days for edema, then restart taking 10mg  every OTHER day.   Follow Up Plan: phone call in 1 week.   Henrene Pastor, PharmD Clinical Pharmacist [place] [facility]

## 2023-08-09 ENCOUNTER — Other Ambulatory Visit (HOSPITAL_COMMUNITY): Payer: Self-pay

## 2023-08-09 ENCOUNTER — Telehealth: Payer: Self-pay

## 2023-08-09 DIAGNOSIS — B20 Human immunodeficiency virus [HIV] disease: Secondary | ICD-10-CM | POA: Diagnosis not present

## 2023-08-09 IMAGING — CT CT ABD-PELV W/ CM
2 of 4 series · 15 of 46 positions shown, 17 images · IV contrast (Omnipaque)
Comparison: Pelvic ultrasound yesterday at [HOSPITAL].
Abdominopelvic CT 07/04/2019, 06/20/2019

CLINICAL DATA: Left lower quadrant abdominal pain. Nausea, vomiting
and fever.

EXAM:
CT ABDOMEN AND PELVIS WITH CONTRAST
TECHNIQUE: Multidetector CT imaging of the abdomen and pelvis was performed
using the standard protocol following bolus administration of
intravenous contrast.

[Series 2: axial st · axial · 0.95mm/px · z∈[-650,-194]mm · 12 of 109 slices shown, 14 images]
[im 9/109  soft-tissue]
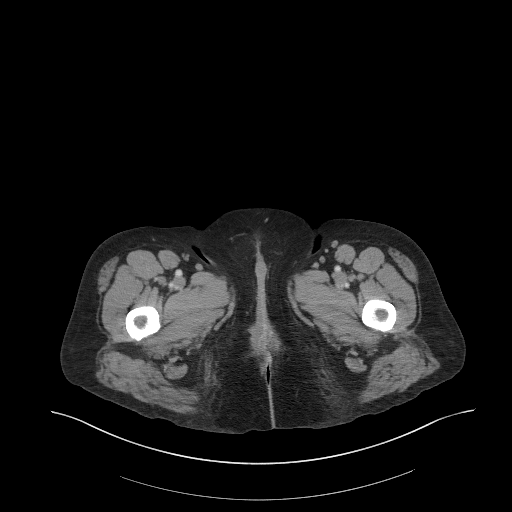
[im 9/109  bone]
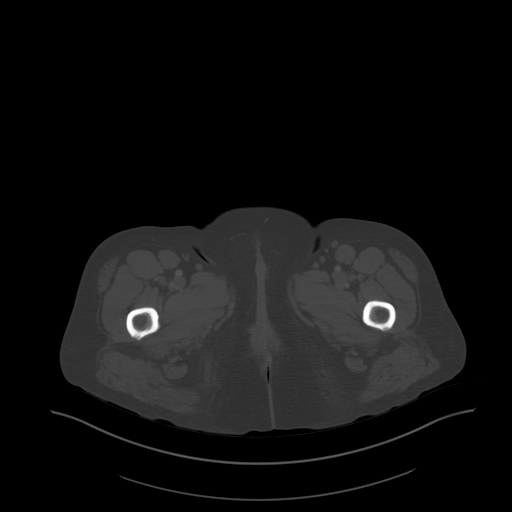
[im 17/109  soft-tissue]
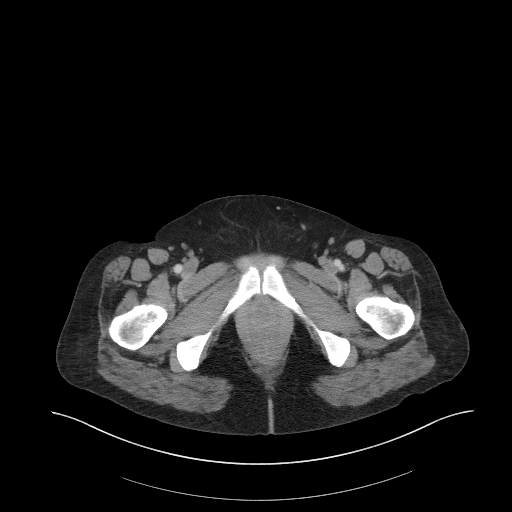
[im 25/109  soft-tissue]
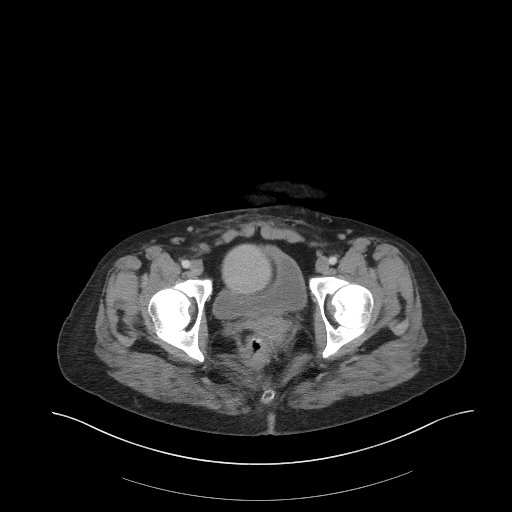
[im 34/109  soft-tissue]
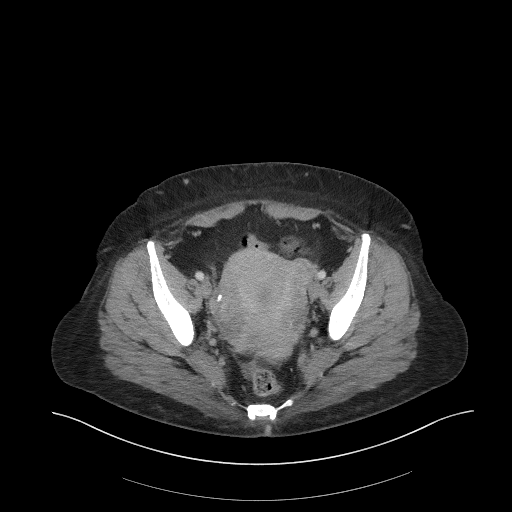
[im 42/109  soft-tissue]
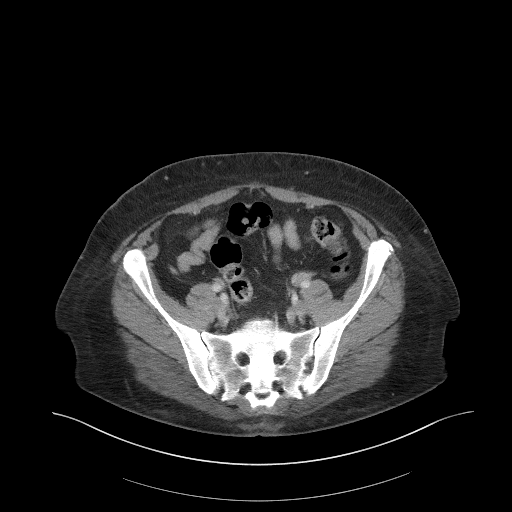
[im 50/109  soft-tissue]
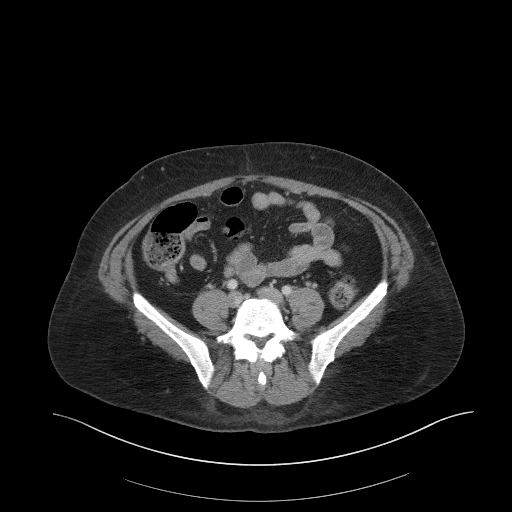
[im 59/109  soft-tissue]
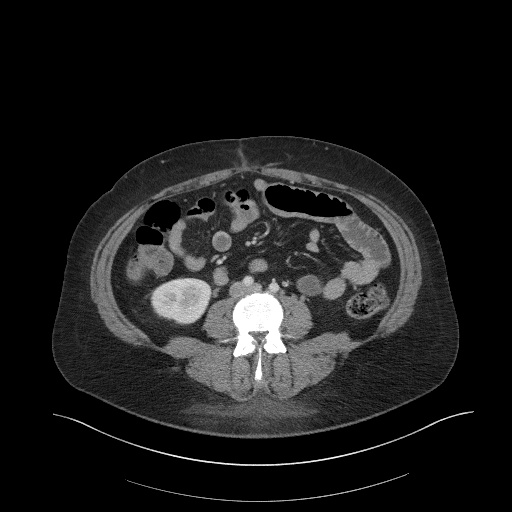
[im 67/109  soft-tissue]
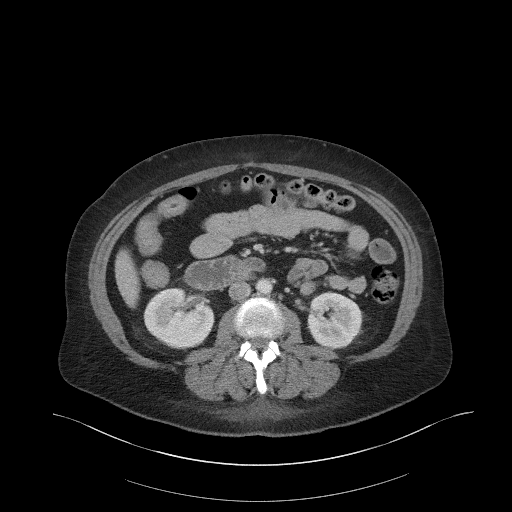
[im 75/109  soft-tissue]
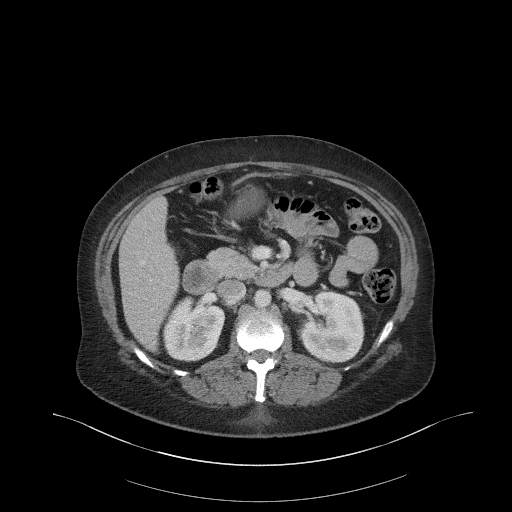
[im 75/109  bone]
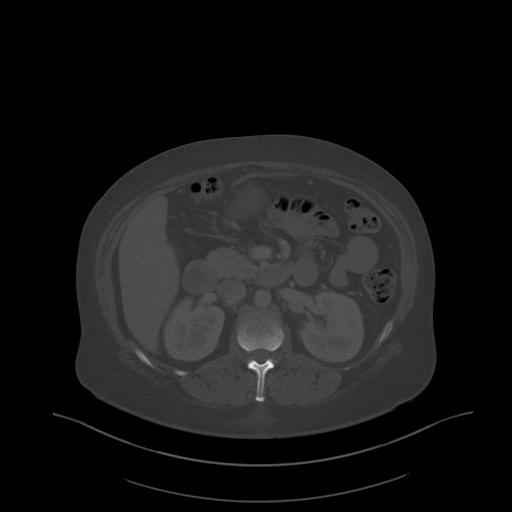
[im 84/109  soft-tissue]
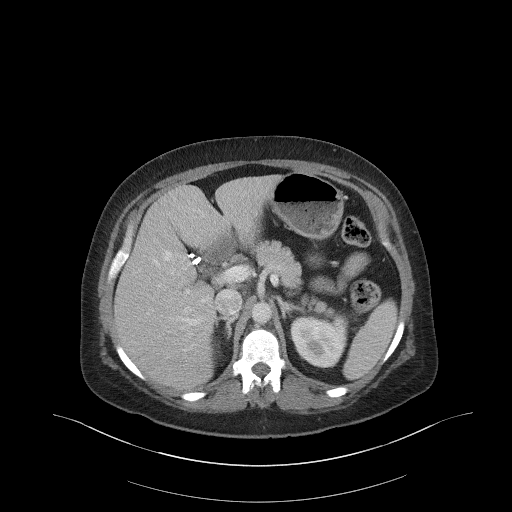
[im 92/109  soft-tissue]
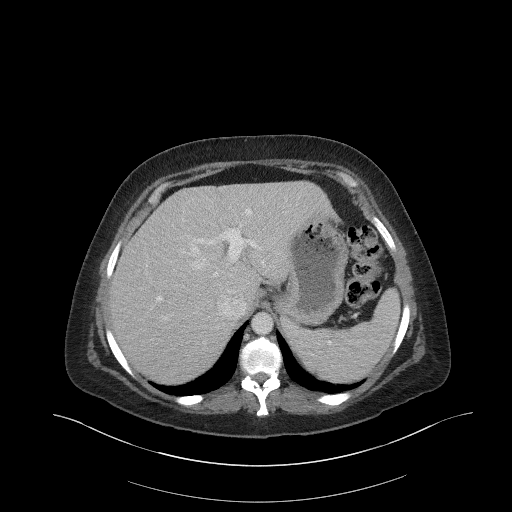
[im 100/109  soft-tissue]
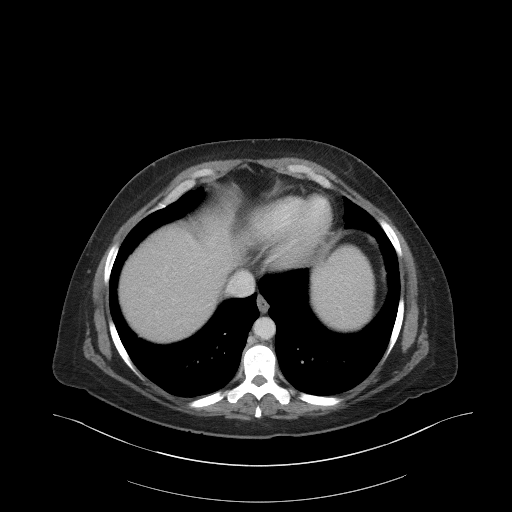

[Series 5: coronal st · coronal · 0.81mm/px · 3 of 96 slices shown]
[im 32/96  soft-tissue]
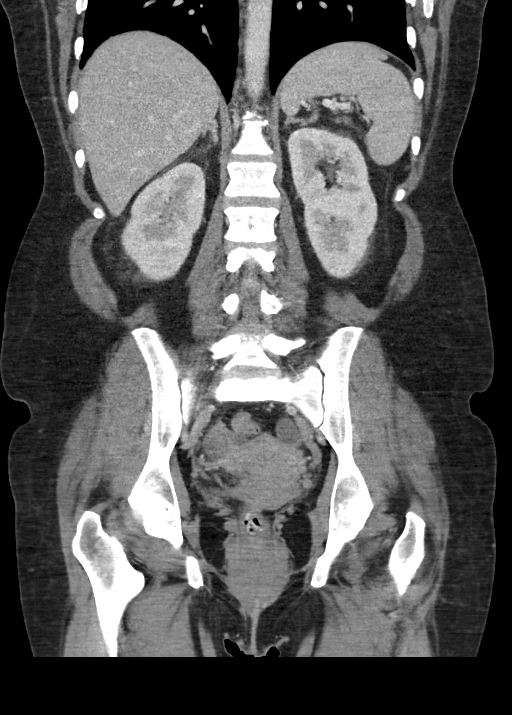
[im 43/96  soft-tissue]
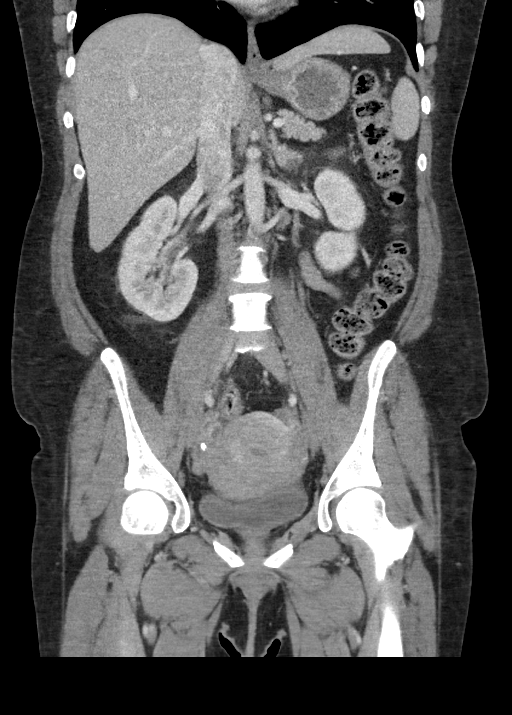
[im 53/96  soft-tissue]
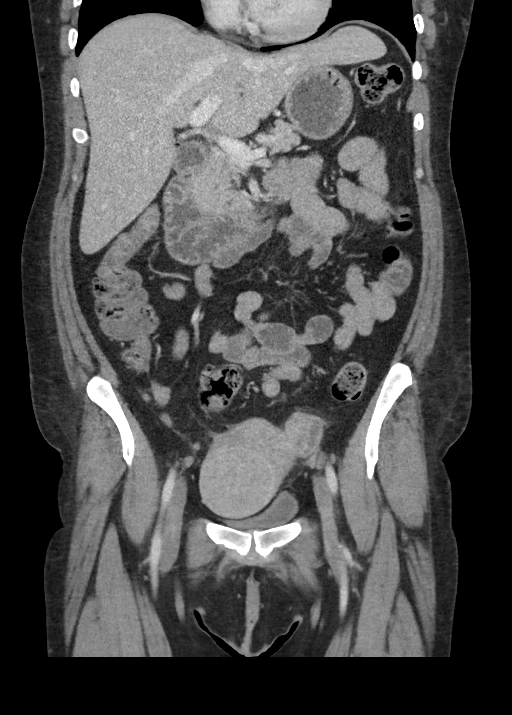

[15 of 46 positions shown; findings below may reference images not displayed]

RADIATION DOSE REDUCTION: This exam was performed according to the
departmental dose-optimization program which includes automated
exposure control, adjustment of the mA and/or kV according to
patient size and/or use of iterative reconstruction technique.

CONTRAST:  100mL OMNIPAQUE IOHEXOL 300 MG/ML  SOLN
FINDINGS: Lower chest: No acute airspace disease or pleural effusion.

Hepatobiliary: Mild subjective hepatic steatosis. No focal liver
lesion. Clips in the gallbladder fossa postcholecystectomy. No
biliary dilatation.

Pancreas: No ductal dilatation or inflammation.

Spleen: Normal in size without focal abnormality.

Adrenals/Urinary Tract: Normal adrenal glands. Mild symmetric
bilateral perinephric edema. No hydronephrosis. No renal calculi or
focal renal lesion. Urinary bladder is only minimally distended.

Stomach/Bowel: Unremarkable appearance of the stomach. There is no
bowel obstruction or inflammation. There are few fluid-filled loops
of small bowel in the left abdomen without wall thickening or
abnormal distention. Normal appendix. Small to moderate stool burden
without colonic inflammation.

Vascular/Lymphatic: Normal caliber abdominal aorta patent portal
vein. Scattered small central mesenteric and retroperitoneal lymph
nodes are not enlarged by size criteria.

Reproductive: Multiple uterine fibroids. Ovoid peripherally
enhancing collection within the posterior aspect of the cervix
measures 2.7 x 1.8 x 2.2 cm, series 2, image 80. This is more
prominent than on prior CT, but smaller than on 06/20/2019 CT. The
ovaries were better assessed on yesterday's pelvic ultrasound. There
is mild generalized stranding of the pelvic fat.

Other: Mild generalized stranding of the pelvic fat. Minimal free
fluid in the dependent pelvis. No free air.

Musculoskeletal: There are no acute or suspicious osseous
abnormalities.
IMPRESSION: 1. Ovoid peripherally enhancing collection within the posterior
aspect of the cervix measuring 2.7 x 1.8 x 2.2 cm. This has been
waxing and waning in size on prior imaging dating back to 3131.
Exact etiology is indeterminate, however the possibility of infected
nabothian cyst is considered given the peripheral enhancement.
2. Mild generalized stranding of the pelvic fat, can be seen with
pelvic inflammatory disease.
3. Multiple uterine fibroids.
4. Mild nonspecific perinephric edema.
5. Mild hepatic steatosis.

## 2023-08-09 NOTE — Telephone Encounter (Signed)
Pharmacy Patient Advocate Encounter   Received notification from Physician's Office that prior authorization for FreeStyle Libre 3 Sensor is required/requested.   Insurance verification completed.   The patient is insured through [facility] [place] .   Per test claim: APPROVED from 07/16/23 to 01/26/24. Ran test claim, Copay is $0. This test claim was processed through [facility]- copay amounts may vary at other pharmacies due to pharmacy/plan contracts, or as the patient moves through the different stages of their insurance plan.   KeyGus Puma PA Case ID #: [id number]  Approved quantity: 2 units per 28 day(s)

## 2023-08-12 ENCOUNTER — Ambulatory Visit (INDEPENDENT_AMBULATORY_CARE_PROVIDER_SITE_OTHER): Payer: Medicaid Other | Admitting: Pharmacist

## 2023-08-12 DIAGNOSIS — E1165 Type 2 diabetes mellitus with hyperglycemia: Secondary | ICD-10-CM

## 2023-08-12 DIAGNOSIS — Z794 Long term (current) use of insulin: Secondary | ICD-10-CM

## 2023-08-12 DIAGNOSIS — I10 Essential (primary) hypertension: Secondary | ICD-10-CM

## 2023-08-12 NOTE — Progress Notes (Signed)
08/12/2023 Name: Renee Bryant MRN: [id number] DOB: 1981-04-12  Chief Complaint  Patient presents with   Diabetes   Hypertension    Renee Bryant is a 42 y.o. year old female who was referred for medication management by their primary care provider, Renee Dana, NP. They presented for a phone visit today.   They were referred to the pharmacist by their PCP for assistance in managing diabetes, medication access, and complex medication management    Subjective:  Care Team: Primary Care Provider: Clayborne Dana, NP ; Next Scheduled Visit: 08/19/2023  Medication Access/Adherence Patient reports affordability concerns with their medications: No  Patient reports access/transportation concerns to their pharmacy: No  Patient reports adherence concerns with their medications:  Yes  previously was having trouble getting updated prescriptions prior PCP officec  Hypertension:  Current medications: metoprolol ER 50mg  daily  Medications previously tried:  losartan - stopped due to increase in Scr.   Patient has a validated, automated, wrist blood pressure cuff at home.  She has been checking blood pressure at home but has not been writing them down. She reports over the weekend SBP has improved and was 130-140's  BP Readings from Last 3 Encounters:  08/07/23 (!) 156/87  07/08/23 129/82  06/26/23 (!) 142/91    Today she also reports that LEE improved after taking furosemide 10mg  daily for 3 days; she is not back to taking 10mg  every other day.   Diabetes: Patient was initially diagnosed with Type 2 DM when she was 42 yo. Per patient she lost from 298 to 160 lbs and was able to stop taking any medication for diabetes. Then blood glucose increased again at 42 yo. She has tried several different medications in the past but feels that insulin works best to get her blood glucose down without causing side effect.   Current medications: Lantus 32 units daily: (increaesd from 25 units  08/02/2023) Humalog - patient is using 3 to 5 units 3 times a day prior to each meal + sliding scale below. (Decreased form 5 units 08/02/2023 and from 11 units 07/30/2023)  Sliding scale. Blood Glucose <150= 0 unit; BG 150-200= 1 unit; BG 201-250= 3 unit; BG 251-300= 5 unit; BG 301-350= 7 unit; BG 351-400= 9 unit; BG >400= 11 unit and Call Primary Care   Medications tried in the past: Trulicity - caused skin rash after taking for about 6 months; Ozempic - nausea and weakness; Jardiance - caused yeast infection, Farxiga - cannot remember why she stopped, metformin - Nausea / diarrhea  Using Libre 3 Continuous Glucose Monitor. States that sample provided last week fell off over 08/09/2023. She does have Rx at [facility] and prior authorization was approved last week but she was told over the weekend that [facility] did not have Libre 3 sensors in stock.    CGM Documentation:  Days Worn: 14 (recommend 14 days) % Time CGM is active: 76% (goal >=70%) Average Glucose: 213 mg/dL Glucose Management Indicator: 8.4% Glucose Variability: 32.4% (goal <36%) Time in Range:  - Time above range >250: 30% (typical goal: <5%) - Time above range 181-250: 40% (typical goal <20%) - Time in range 70-180 30% (typical goal >=70%) - Time below range 54-69: 0% (typical goal <4%) - noted hypoglycemia lasting about 45 minutes over the last 14 days.  - Time below range: 0% (typical goal <1%)      Patient reports occasional hypoglycemic s/sx including dizziness, shakiness, sweating.  Patient reports hyperglycemic symptoms including blurry vision. But no  polyuria, polydipsia, polyphagia, nocturia, neuropathy.   Objective:  Lab Results  Component Value Date   HGBA1C 13.1 (H) 05/02/2023    Lab Results  Component Value Date   CREATININE 1.74 (H) 07/08/2023   BUN 28 (H) 07/08/2023   NA 134 (L) 07/08/2023   K 4.3 07/08/2023   CL 101 07/08/2023   CO2 27 07/08/2023    Lab Results  Component Value Date    CHOL 214 (H) 05/03/2023   HDL 33 (L) 05/03/2023   LDLCALC 139 (H) 05/03/2023   TRIG 208 (H) 05/03/2023   CHOLHDL 6.5 05/03/2023    Medications Reviewed Today     Reviewed by Henrene Pastor, RPH-CPP (Pharmacist) on 08/12/23 at 1012  Med List Status: <None>   Medication Order Taking? Sig Documenting Provider Last Dose Status Informant  albuterol (VENTOLIN HFA) 108 (90 Base) MCG/ACT inhaler [id number] Yes Inhale 1-2 puffs into the lungs every 6 (six) hours as needed for wheezing or shortness of breath. Wanda Plump, MD Taking Active   aspirin 81 MG chewable tablet [id number] Yes Chew 1 tablet (81 mg total) by mouth daily. Alberteen Sam, MD Taking Active   atorvastatin (LIPITOR) 80 MG tablet [id number] Yes Take 1 tablet (80 mg total) by mouth daily. Bradd Canary, MD Taking Active   bictegravir-emtricitabine-tenofovir AF (BIKTARVY) 50-200-25 MG TABS tablet [id number] Yes Take by mouth daily. [provider] Taking Active Self  clonazePAM (KLONOPIN) 0.5 MG tablet [id number] Yes Take 1 mg by mouth 2 (two) times daily as needed for anxiety. [provider] Taking Active Self  clopidogrel (PLAVIX) 75 MG tablet [id number] Yes TAKE 1 TABLET(75 MG) BY MOUTH DAILY WITH BREAKFAST O'Neal, Ronnald Ramp, MD Taking Active   Continuous Glucose Sensor (FREESTYLE LIBRE 3 SENSOR) [place] [id number] Yes Use to check blood glucose continuously. Wanda Plump, MD Taking Active   furosemide (LASIX) 20 MG tablet [id number] Yes Take 0.5-1 tablets (10-20 mg total) by mouth every other day. Wanda Plump, MD Taking Active   insulin glargine (LANTUS) 100 UNIT/ML injection [id number] Yes Inject 30 Units into the skin at bedtime. [provider] Taking Active Self  insulin lispro (HUMALOG) 100 UNIT/ML KwikPen [id number] Yes Inject 0-11 Units into the skin 3 (three) times daily with meals based on sliding scale: Blood Glucose <150= 0 unit; BG 150-200= 1 unit; BG 201-250= 3 unit; BG 251-300= 5 unit; BG  301-350= 7 unit; BG 351-400= 9 unit; BG >400= 11 unit and Call Primary Care.  Patient taking differently: Inject 5 Units into the skin 3 (three) times daily before meals.   Wanda Plump, MD Taking Active   Insulin Pen Needle 32G X 4 MM MISC [id number] Yes Use 3 times a day with Humalog Kwik pen Wanda Plump, MD Taking Active   Insulin Syringe-Needle U-100 [facility] INSULIN SYRINGES 31GX5/16") 31G X 5/16" 0.3 ML MISC [id number]  Use to inject Lantus once a day Wanda Plump, MD  Active   metoprolol succinate (TOPROL-XL) 50 MG 24 hr tablet [id number] Yes Take 1 tablet (50 mg total) by mouth daily. Wanda Plump, MD Taking Active   ondansetron (ZOFRAN-ODT) 4 MG disintegrating tablet [id number]  Take 1 tablet (4 mg total) by mouth every 8 (eight) hours as needed for nausea or vomiting. Renne Crigler, PA-C  Active   ranolazine (RANEXA) 500 MG 12 hr tablet [id number] Yes Take 1 tablet (500 mg total) by mouth 2 (two) times daily. Renee Dana, NP Taking Active   valACYclovir Ralph Dowdy)  500 MG tablet [id number] Yes Take 500 mg by mouth 2 (two) times daily as needed (For cold sores). [provider] Taking Active Self  Vitamin D, Ergocalciferol, (DRISDOL) 1.25 MG (50000 UNIT) CAPS capsule [id number] Yes Take 1 capsule (50,000 Units total) by mouth every 7 (seven) days. Renee Dana, NP Taking Active               Assessment/Plan:   Diabetes: Last A1c was not at goal but recent blood glucose has improved.  - Reviewed CGM report. Blood glucose improved but not at goals.  - Reviewed goal A1c, goal fasting, and goal 2 hour post prandial glucose - Recommend increase Lantus to 34 units at bedtime and mealtime Humalog 3 to 5 units prior to each meal + additional insulin for correction based on blood glucose and sliding scale.  Sliding scale. Blood Glucose <150= 0 unit; BG 150-200= 1 unit; BG 201-250= 3 unit; BG 251-300= 5 unit; BG 301-350= 7 unit; BG 351-400= 9 unit; BG >400= 11 unit and Call Primary Care  -  Recommend to check glucose prior to meals and as needed with Continuous Glucose Monitor.  - could consider recheck C-Peptide and if WNL maybe consider retrial of either SGLT2 or GLP1 / Mounjaro due to CVD benefits.    Hypertension:  - Continue to take metoprolol 50mg  daily.   - Continue to check blood pressure daily. Patient to record blood pressure and weight daily in notebook for future visits.  - Continue furosemide10mg  every OTHER day.   Follow Up Plan: PCP visit in 1 week; Clinical Pharmacist Practitioner will check in in 2 weeks.   Henrene Pastor, PharmD Clinical Pharmacist [facility]

## 2023-08-13 DIAGNOSIS — B20 Human immunodeficiency virus [HIV] disease: Secondary | ICD-10-CM | POA: Diagnosis not present

## 2023-08-14 DIAGNOSIS — J4530 Mild persistent asthma, uncomplicated: Secondary | ICD-10-CM | POA: Diagnosis not present

## 2023-08-14 DIAGNOSIS — K219 Gastro-esophageal reflux disease without esophagitis: Secondary | ICD-10-CM | POA: Diagnosis not present

## 2023-08-14 DIAGNOSIS — R053 Chronic cough: Secondary | ICD-10-CM | POA: Diagnosis not present

## 2023-08-14 DIAGNOSIS — J984 Other disorders of lung: Secondary | ICD-10-CM | POA: Diagnosis not present

## 2023-08-14 DIAGNOSIS — F17210 Nicotine dependence, cigarettes, uncomplicated: Secondary | ICD-10-CM | POA: Diagnosis not present

## 2023-08-14 DIAGNOSIS — J309 Allergic rhinitis, unspecified: Secondary | ICD-10-CM | POA: Diagnosis not present

## 2023-08-18 DIAGNOSIS — F411 Generalized anxiety disorder: Secondary | ICD-10-CM | POA: Diagnosis not present

## 2023-08-18 DIAGNOSIS — E6609 Other obesity due to excess calories: Secondary | ICD-10-CM | POA: Diagnosis not present

## 2023-08-18 DIAGNOSIS — G4700 Insomnia, unspecified: Secondary | ICD-10-CM | POA: Diagnosis not present

## 2023-08-18 DIAGNOSIS — F410 Panic disorder [episodic paroxysmal anxiety] without agoraphobia: Secondary | ICD-10-CM | POA: Diagnosis not present

## 2023-08-18 DIAGNOSIS — Z1339 Encounter for screening examination for other mental health and behavioral disorders: Secondary | ICD-10-CM | POA: Diagnosis not present

## 2023-08-18 DIAGNOSIS — F4310 Post-traumatic stress disorder, unspecified: Secondary | ICD-10-CM | POA: Diagnosis not present

## 2023-08-18 DIAGNOSIS — Z1331 Encounter for screening for depression: Secondary | ICD-10-CM | POA: Diagnosis not present

## 2023-08-18 DIAGNOSIS — Z6833 Body mass index (BMI) 33.0-33.9, adult: Secondary | ICD-10-CM | POA: Diagnosis not present

## 2023-08-18 DIAGNOSIS — F330 Major depressive disorder, recurrent, mild: Secondary | ICD-10-CM | POA: Diagnosis not present

## 2023-08-19 ENCOUNTER — Ambulatory Visit (INDEPENDENT_AMBULATORY_CARE_PROVIDER_SITE_OTHER): Payer: Medicaid Other | Admitting: Family Medicine

## 2023-08-19 ENCOUNTER — Encounter: Payer: Self-pay | Admitting: Family Medicine

## 2023-08-19 VITALS — BP 159/82 | HR 72 | Ht 67.0 in | Wt 215.0 lb

## 2023-08-19 DIAGNOSIS — J449 Chronic obstructive pulmonary disease, unspecified: Secondary | ICD-10-CM | POA: Diagnosis not present

## 2023-08-19 DIAGNOSIS — Z794 Long term (current) use of insulin: Secondary | ICD-10-CM

## 2023-08-19 DIAGNOSIS — E559 Vitamin D deficiency, unspecified: Secondary | ICD-10-CM

## 2023-08-19 DIAGNOSIS — B20 Human immunodeficiency virus [HIV] disease: Secondary | ICD-10-CM

## 2023-08-19 DIAGNOSIS — R600 Localized edema: Secondary | ICD-10-CM

## 2023-08-19 DIAGNOSIS — J45909 Unspecified asthma, uncomplicated: Secondary | ICD-10-CM | POA: Diagnosis not present

## 2023-08-19 DIAGNOSIS — E1165 Type 2 diabetes mellitus with hyperglycemia: Secondary | ICD-10-CM

## 2023-08-19 DIAGNOSIS — I10 Essential (primary) hypertension: Secondary | ICD-10-CM

## 2023-08-19 DIAGNOSIS — N1831 Chronic kidney disease, stage 3a: Secondary | ICD-10-CM

## 2023-08-19 LAB — COMPREHENSIVE METABOLIC PANEL
ALT: 21 U/L (ref 0–35)
AST: 17 U/L (ref 0–37)
Albumin: 2.5 g/dL — ABNORMAL LOW (ref 3.5–5.2)
Alkaline Phosphatase: 52 U/L (ref 39–117)
BUN: 22 mg/dL (ref 6–23)
CO2: 29 mEq/L (ref 19–32)
Calcium: 7.9 mg/dL — ABNORMAL LOW (ref 8.4–10.5)
Chloride: 105 mEq/L (ref 96–112)
Creatinine, Ser: 1.51 mg/dL — ABNORMAL HIGH (ref 0.40–1.20)
GFR: 42.77 mL/min — ABNORMAL LOW (ref >60.00)
Glucose, Bld: 149 mg/dL — ABNORMAL HIGH (ref 70–99)
Potassium: 4.2 mEq/L (ref 3.5–5.1)
Sodium: 139 mEq/L (ref 135–145)
Total Bilirubin: 0.3 mg/dL (ref 0.2–1.2)
Total Protein: 4.8 g/dL — ABNORMAL LOW (ref 6.0–8.3)

## 2023-08-19 LAB — BRAIN NATRIURETIC PEPTIDE: Pro B Natriuretic peptide (BNP): 76 pg/mL (ref 0.0–100.0)

## 2023-08-19 LAB — TSH: TSH: 1.31 u[IU]/mL (ref 0.35–5.50)

## 2023-08-19 LAB — HEMOGLOBIN A1C: Hgb A1c MFr Bld: 8.7 % — ABNORMAL HIGH (ref 4.6–6.5)

## 2023-08-19 LAB — VITAMIN D 25 HYDROXY (VIT D DEFICIENCY, FRACTURES): VITD: 7.9 ng/mL — ABNORMAL LOW (ref 30.00–100.00)

## 2023-08-19 NOTE — Assessment & Plan Note (Signed)
Following with [facility] - encouraged she schedule follow-up Repeat labs today

## 2023-08-19 NOTE — Assessment & Plan Note (Signed)
Poorly controlled with last A1c >13% (referred to endocrinology and pharmacy team) Continue current medications for now Discussed diet and exercise Checking A1c today

## 2023-08-19 NOTE — Assessment & Plan Note (Signed)
Blood pressure is now at goal for age and co-morbidities.   Recommendations: continue current meds, checking labs today, may temporarily increase lasix. Keep cardiology appointment for next week. - BP goal <130/80 - monitor and log blood pressures at home - check around the same time each day in a relaxed setting - Limit salt to <2000 mg/day - Follow DASH eating plan (heart healthy diet) - limit alcohol to 2 standard drinks per day for men and 1 per day for women - avoid tobacco products - get at least 2 hours of regular aerobic exercise weekly Patient aware of signs/symptoms requiring further/urgent evaluation. Labs updated today.

## 2023-08-19 NOTE — Assessment & Plan Note (Signed)
Following with [facility] No acute concerns/symptoms

## 2023-08-19 NOTE — Progress Notes (Signed)
Established Patient Office Visit  Subjective    Patient ID: Renee Bryant, female    DOB: Dec 02, 1980  Age: 42 y.o. MRN: [id number]  CC:  Chief Complaint  Patient presents with   Medical Management of Chronic Issues    HPI [name] presents to establish care   Discussed the use of AI scribe software for clinical note transcription with the patient, who gave verbal consent to proceed.  History of Present Illness   The patient, with a history of diabetes, presents for a regular follow-up. They report significant improvement in their blood sugar levels, with an estimated HbA1c of 8.6, down from 15. They attribute this improvement to the use of a continuous glucose monitor and adjustments to their insulin regimen. Despite these improvements, the patient has been experiencing significant fluid retention, leading to a weight gain of approximately 15 pounds over the past six weeks. This has resulted in discomfort and difficulty walking due to the swelling in their legs. They have been taking Lasix every other day, and for three consecutive days when the swelling is severe.  The patient also reports a persistent, non-productive cough that worsens at night. They have been managing this with Pepcid for suspected acid reflux. They have recently seen a pulmonologist who ruled out COPD and suggested the cough could be due to asthma or reflux. The patient also mentions being on methadone for mental health management and expresses concerns about being put on too many medications.       Diabetes: - Checking glucose at home: yes, fasting <200 consistently  - Medications: Lantus 34 units nightly; Humalog sliding scale with meals + 7 units meal coverage - Compliance: good - Diet: heart healthy, diabetic - Exercise: minimal - Eye exam: unknown - Foot exam: unknown - Microalbumin: 07/08/23 high, following with nephrology  - Denies symptoms of hypoglycemia, polyuria, polydipsia, numbness  extremities, foot ulcers/trauma, wounds that are not healing, medication side effects  Lab Results  Component Value Date   HGBA1C 13.1 (H) 05/02/2023     HTN, HLD, CAD (NSTEMI 05/02/23 -proximal to mid LAD 40%, D1 90% not amenable to PCI secondary to concern for plaque shift/impingement of the LAD. Aggressive medical therapy and risk factor modification was recommended; EF 45-50%) : - Medications: aspirin 81 mg daily, Plavix 75 mg daily, Lasix 10-20 mg every other day (3 days in a row 10 mg PRN with edema), metoprolol succinate 50 mg daily, ranexa 500 mg BID - Compliance: good - Checking BP at home: rarely - Denies any SOB, recurrent headaches, CP, vision changes, LE edema, dizziness, palpitations, or medication side effects. - Diet: trying for heart healthy diabetic diet - Exercise: minimal - Following with [facility]- seeing them next week   Wt Readings from Last 3 Encounters:  08/19/23 215 lb (97.5 kg)  07/08/23 201 lb (91.2 kg)  06/26/23 200 lb (90.7 kg)    HIV: - [facility] - Biktarvy   CKD 3: - [facility], due to see them in 2 months   Asthma/Smoking: - [facility] - States they ruled out COPD at recent visit. She is trying to cut back on smoking. They recommended trial of Nicotrol inhaler.    ROS All review of systems negative except what is listed in the HPI      Objective    BP (!) 159/82   Pulse 72   Ht 5\' 7"  (1.702 m)   Wt 215 lb (97.5 kg)   SpO2 99%  BMI 33.67 kg/m   Physical Exam Vitals reviewed.  Constitutional:      Appearance: Normal appearance.  Cardiovascular:     Rate and Rhythm: Normal rate and regular rhythm.     Heart sounds: Normal heart sounds.  Pulmonary:     Effort: Pulmonary effort is normal.     Breath sounds: Normal breath sounds.  Musculoskeletal:     Right lower leg: Edema present.     Left lower leg: Edema present.  Skin:    General: Skin is warm and dry.  Neurological:      Mental Status: She is alert and oriented to person, place, and time.  Psychiatric:        Mood and Affect: Mood normal.        Behavior: Behavior normal.        Thought Content: Thought content normal.        Judgment: Judgment normal.         Assessment & Plan:   Problem List Items Addressed This Visit       Active Problems   HIV (human immunodeficiency virus infection) (HCC) (Chronic)    Well-managed with undetectable levels for the past 15 years. Currently on Biktarvy. -Continue current management with infectious disease specialist.      Essential hypertension (Chronic)    Blood pressure is now at goal for age and co-morbidities.   Recommendations: continue current meds, checking labs today, may temporarily increase lasix. Keep cardiology appointment for next week. - BP goal <130/80 - monitor and log blood pressures at home - check around the same time each day in a relaxed setting - Limit salt to <2000 mg/day - Follow DASH eating plan (heart healthy diet) - limit alcohol to 2 standard drinks per day for men and 1 per day for women - avoid tobacco products - get at least 2 hours of regular aerobic exercise weekly Patient aware of signs/symptoms requiring further/urgent evaluation. Labs updated today.       Uncontrolled type 2 diabetes mellitus with hyperglycemia, with long-term current use of insulin (HCC) - Primary (Chronic)    Poorly controlled with last A1c >13% (referred to endocrinology and pharmacy team) Continue current medications for now Discussed diet and exercise Checking A1c today         Relevant Orders   Hemoglobin A1c   Comprehensive metabolic panel   TSH   CKD stage 3a, GFR 45-59 ml/min (HCC) (Chronic)    Following with [facility] - encouraged she schedule follow-up Repeat labs today       Vitamin D deficiency (Chronic)   Relevant Orders   VITAMIN D 25 Hydroxy (Vit-D Deficiency, Fractures)   Asthma    Following with [facility]  pulmonology No acute concerns/symptoms        Resolved Problems   RESOLVED: COPD (chronic obstructive pulmonary disease) (HCC) (Chronic)   Other Visit Diagnoses     Bilateral lower extremity edema       Relevant Orders   Brain natriuretic peptide   Comprehensive metabolic panel      Edema Significant weight gain (15 lbs in 6 weeks) and leg swelling despite Lasix use. Patient is monitoring weight and blood pressure at home. No new symptoms of heart failure. -Check kidney function today. -Consider increasing Lasix dose depending on kidney function results. -Keep cardiology appointment for next week -Patient aware of signs/symptoms requiring further/urgent evaluation.     Return in about 3 months (around 11/18/2023) for routine follow-up.   Clayborne Dana,  NP

## 2023-08-19 NOTE — Assessment & Plan Note (Signed)
Well-managed with undetectable levels for the past 15 years. Currently on Biktarvy. -Continue current management with infectious disease specialist.

## 2023-08-20 ENCOUNTER — Other Ambulatory Visit: Payer: Self-pay | Admitting: Family Medicine

## 2023-08-20 DIAGNOSIS — E559 Vitamin D deficiency, unspecified: Secondary | ICD-10-CM

## 2023-08-20 MED ORDER — VITAMIN D (ERGOCALCIFEROL) 1.25 MG (50000 UNIT) PO CAPS
50000.0000 [IU] | ORAL_CAPSULE | ORAL | 0 refills | Status: DC
Start: 2023-08-20 — End: 2023-11-19

## 2023-08-22 NOTE — Progress Notes (Signed)
Cardiology Clinic Note   Date: 08/26/2023 ID: Renee Bryant, DOB 05-19-1981, MRN [id number]  Primary Cardiologist:  Reatha Harps, MD  Patient Profile    Renee Bryant is a 42 y.o. female who presents to the clinic today for routine follow up.     Past medical history significant for: CAD. LHC 05/03/2023 (NSTEMI): Proximal to mid LAD 40%.  D1 90%.  Recommend aggressive medical therapy and risk factor modification.  Given ostial location of diagonal stenosis PCI is less than ideal since this could lead to plaque shift/impingement of the LAD.  Ranexa added secondary to soft BP. HFmrEF. Echo 05/02/2023: Distal septal apical hypokinesis.  EF 45 to 50%.  RWMA.  Normal RV function.  Trivial MR.  Aortic valve sclerosis without stenosis. Hypertension. Hyperlipidemia. Lipid panel 05/03/2023: LDL 139, HDL 33, TG 208, total 214. LPa 05/03/2023: 16.6. IDDM. CKD stage IIIa. HIV. Dysfunctional uterine bleeding. Tobacco abuse.      History of Present Illness    Renee Bryant was first evaluated by Dr. Flora Lipps on 05/02/2023 for NSTEMI.  Patient presented to the ED with a 1 day history of chest discomfort described as burning radiating to her left arm with associated nausea and vomiting as well as shortness of breath.  Pepcid partially relieved symptoms.  Upon arrival to [facility] she was noted to be significantly hyperglycemic.  Troponin 113>> 146.  She was transferred to [facility].  She underwent LHC which showed proximal to mid LAD 40%, D1 90% not amenable to PCI secondary to concern for plaque shift/impingement of the LAD.  Aggressive medical therapy and risk factor modification was recommended.   Patient was seen in the office by me on 05/13/2023 for post cath follow-up.  She was doing well at that time with no further episodes of chest pain.  She was requesting to restart Lasix for her chronic lower extremity edema as it was stopped in the hospital.  Labs drawn at that time showed a  decline in kidney function with creatinine 2.07.  Patient was instructed to stop losartan and return in a week for repeat BMP.  Repeat BMP showed improved creatinine to 1.45.  Patient was last seen in the office by Edd Fabian, NP on 05/24/2023.  She was noted to have right greater than left lower extremity edema.  Lasix was restarted for every other day dosing and she was instructed to return for repeat BMP in 1 to 2 weeks.  Patient had 2 ED visits in July for abdominal pain.  Her first visit on 06/08/2023 was felt to be a possible early flareup of diverticulitis.  She wanted to avoid CT imaging and was started empirically on oral antibiotics.  Her second visit on 06/26/2023 somewhat similar but patient was concerned pain was her anginal equivalent.  Troponin was negative.  CT abd pelvis showed right ovarian cyst 2.2 cm.  Today, patient reports continued lower extremity edema. Her PCP has been following her kidney function, as she cannot get into the nephrology until December. She is currently taking Lasix 20 mg every other day. She reports R>L edema as well as abdominal bloating and fullness. Some mornings her left leg is almost normal. She has not been weighing at home consistently. She is unsure what her base weight is but feels she started having increased issues with edema around her heart attack. In going back through older visits this year her base weight is likely ~195 lb. BP is poorly controlled. Home readings between 145-175/90-110.  PCP recently increased Toprol to 50 mg daily. She is working on quitting smoking. She was evaluated by pulmonology and told she does not have COPD but likely has asthma.       ROS: All other systems reviewed and are otherwise negative except as noted in History of Present Illness.  Studies Reviewed    EKG is not ordered today.      Physical Exam    VS:  BP (!) 160/90 (BP Location: Left Arm, Patient Position: Sitting, Cuff Size: Normal)   Pulse 68   Ht 5\' 7"   (1.702 m)   Wt 212 lb (96.2 kg)   SpO2 98%   BMI 33.20 kg/m  , BMI Body mass index is 33.2 kg/m.  GEN: Well nourished, well developed, in no acute distress. Neck: No JVD or carotid bruits. Cardiac:  RRR. No murmurs. No rubs or gallops.   Respiratory:  Respirations regular and unlabored. Clear to auscultation without rales, wheezing or rhonchi. GI: Soft, nontender, nondistended. Extremities: Radials/DP/PT 2+ and equal bilaterally. No clubbing or cyanosis. 1+ pitting edema R>L lower extremity.   Skin: Warm and dry, no rash. Neuro: Strength intact.  Assessment & Plan    CAD.  LHC June 2024 showed proximal to mid LAD 40%, D1 90% not amenable to PCI secondary to concern for plaque shift/impingement of the LAD.  Patient denies chest pain. Continue atorvastatin, aspirin, Plavix, metoprolol, Ranexa. Provided with Rx for prn NTG.  HFmrEF.  Echo June 2024 showed EF 45 to 50% with normal RV function.  Patient continues to have R>L lower extremity edema, abdominal fullness/bloating. She has not been weighing consistently at home and is not completely certain what her base weight is. Reviewed past visits and feel base weight likely ~195 lb. She denies shortness of breath. 1+ pitting edema R>L lower extremities. Lung sounds are clear to auscultation without wheezing, rhonchi or rales.  Last creatinine on 08/19/2023 was 1.51, GFR 42.77.  BNP 76. Discussed case with Pharm D. who feels her GFR is acceptable to start Entresto. She is provided with samples of Entresto 24-26 mg bid and Rx sent. She is instructed to weight daily and only take Lasix 20 mg for weight gain of 3 lb overnight or 5 lb in a week. BMP in 10 days.  Hypertension. BP today 160/90.  Home BP 145-175/90-110. Patient denies headaches, dizziness or vision changes. Patient is instructed to stop Toprol and start carvedilol 3.125 mg bid and Entresto 24-26 mg bid (see #2). Continue to monitor BP at home.  Hyperlipidemia.  LDL June 2024 139, not at  goal.  Continue atorvastatin.   Tobacco abuse. Patient is working on quitting smoking. Pulmonary prescribed Nicotrol inhaler and patient is awaiting insurance approval.   Disposition: Stop Toprol. Start carvedilol 3.125 mg bid and Entresto 24-26 mg bid. Take Lasix only as needed for weight gain. Return in 10 days for BMP and 1 month for follow up or sooner as needed.          Signed, Etta Grandchild. Bridgitte Felicetti, DNP, NP-C

## 2023-08-26 ENCOUNTER — Other Ambulatory Visit: Payer: Self-pay

## 2023-08-26 ENCOUNTER — Encounter: Payer: Self-pay | Admitting: Student

## 2023-08-26 ENCOUNTER — Ambulatory Visit: Payer: Medicaid Other | Attending: Student | Admitting: Student

## 2023-08-26 VITALS — BP 160/90 | HR 68 | Ht 67.0 in | Wt 212.0 lb

## 2023-08-26 DIAGNOSIS — I2510 Atherosclerotic heart disease of native coronary artery without angina pectoris: Secondary | ICD-10-CM

## 2023-08-26 DIAGNOSIS — Z720 Tobacco use: Secondary | ICD-10-CM

## 2023-08-26 DIAGNOSIS — I5022 Chronic systolic (congestive) heart failure: Secondary | ICD-10-CM

## 2023-08-26 DIAGNOSIS — I10 Essential (primary) hypertension: Secondary | ICD-10-CM | POA: Diagnosis not present

## 2023-08-26 DIAGNOSIS — E785 Hyperlipidemia, unspecified: Secondary | ICD-10-CM | POA: Diagnosis not present

## 2023-08-26 DIAGNOSIS — Z79899 Other long term (current) drug therapy: Secondary | ICD-10-CM

## 2023-08-26 MED ORDER — CARVEDILOL 3.125 MG PO TABS: 3.1250 mg | ORAL_TABLET | Freq: Two times a day (BID) | ORAL | 3 refills | Status: DC

## 2023-08-26 MED ORDER — ENTRESTO 24-26 MG PO TABS: 1.0000 | ORAL_TABLET | Freq: Two times a day (BID) | ORAL | 0 refills | Status: DC

## 2023-08-26 MED ORDER — NITROGLYCERIN 0.4 MG SL SUBL: 0.4000 mg | SUBLINGUAL_TABLET | SUBLINGUAL | 3 refills | Status: AC | PRN

## 2023-08-26 MED ORDER — SACUBITRIL-VALSARTAN 24-26 MG PO TABS: 1.0000 | ORAL_TABLET | Freq: Two times a day (BID) | ORAL | 3 refills | Status: DC

## 2023-08-26 NOTE — Patient Instructions (Addendum)
Medication Instructions:  - Start ENTRESTO 24-26MG , twice daily.  - Start Carvedilol 3.125mg , twice daily  - STOP Metoprolol succinate   -Take Lasix 20mg  for weight gain of 3lb overnight or 5lb in a week.    *If you need a refill on your cardiac medications before your next appointment, please call your pharmacy*   Lab Work: Your physician recommends that you return for lab work in: 10 days  - BMP     If you have labs (blood work) drawn today and your tests are completely normal, you will receive your results only by: MyChart Message (if you have MyChart) OR A paper copy in the mail If you have any lab test that is abnormal or we need to change your treatment, we will call you to review the results.     Follow-Up: At [facility], you and your health needs are our priority.  As part of our continuing mission to provide you with exceptional heart care, we have created designated Provider Care Teams.  These Care Teams include your primary Cardiologist (physician) and Advanced Practice Providers (APPs -  Physician Assistants and Nurse Practitioners) who all work together to provide you with the care you need, when you need it.  We recommend signing up for the patient portal called "MyChart".  Sign up information is provided on this After Visit Summary.  MyChart is used to connect with patients for Virtual Visits (Telemedicine).  Patients are able to view lab/test results, encounter notes, upcoming appointments, etc.  Non-urgent messages can be sent to your provider as well.   To learn more about what you can do with MyChart, go to [email or web address].    Your next appointment:   1 month(s): 09/27/23 at 11:30am   The format for your next appointment:   In Person  Provider:   Reatha Harps, MD     Other Instructions

## 2023-08-27 DIAGNOSIS — Z419 Encounter for procedure for purposes other than remedying health state, unspecified: Secondary | ICD-10-CM | POA: Diagnosis not present

## 2023-09-02 ENCOUNTER — Telehealth: Payer: Self-pay | Admitting: Family Medicine

## 2023-09-02 ENCOUNTER — Ambulatory Visit (HOSPITAL_COMMUNITY): Payer: Medicaid Other | Admitting: Psychiatry

## 2023-09-02 NOTE — Telephone Encounter (Signed)
Prescription Request  09/02/2023  Is this a "Controlled Substance" medicine? No  LOV: 08/19/2023  What is the name of the medication or equipment?   clopidogrel (PLAVIX) 75 MG tablet [[id number]]  insulin glargine (LANTUS) 100 UNIT/ML injection [[id number]]  Have you contacted your pharmacy to request a refill? No   Which pharmacy would you like this sent to?   [facility] [place], [place] - [address] [place] [address] Phone: [phone] Fax: [phone]    Patient notified that their request is being sent to the clinical staff for review and that they should receive a response within 2 business days.   Please advise at Mobile [phone] (mobile)

## 2023-09-03 DIAGNOSIS — E66811 Obesity, class 1: Secondary | ICD-10-CM | POA: Diagnosis not present

## 2023-09-03 DIAGNOSIS — E1165 Type 2 diabetes mellitus with hyperglycemia: Secondary | ICD-10-CM | POA: Diagnosis not present

## 2023-09-03 DIAGNOSIS — Z794 Long term (current) use of insulin: Secondary | ICD-10-CM | POA: Diagnosis not present

## 2023-09-03 DIAGNOSIS — E6609 Other obesity due to excess calories: Secondary | ICD-10-CM | POA: Diagnosis not present

## 2023-09-03 DIAGNOSIS — E1169 Type 2 diabetes mellitus with other specified complication: Secondary | ICD-10-CM | POA: Diagnosis not present

## 2023-09-03 DIAGNOSIS — I10 Essential (primary) hypertension: Secondary | ICD-10-CM | POA: Diagnosis not present

## 2023-09-03 DIAGNOSIS — E785 Hyperlipidemia, unspecified: Secondary | ICD-10-CM | POA: Diagnosis not present

## 2023-09-03 DIAGNOSIS — Z6832 Body mass index (BMI) 32.0-32.9, adult: Secondary | ICD-10-CM | POA: Diagnosis not present

## 2023-09-04 ENCOUNTER — Telehealth: Payer: Self-pay | Admitting: Cardiovascular Disease

## 2023-09-04 MED ORDER — CLOPIDOGREL BISULFATE 75 MG PO TABS: 75.0000 mg | ORAL_TABLET | Freq: Every day | ORAL | 3 refills | Status: DC

## 2023-09-04 MED ORDER — RANOLAZINE ER 500 MG PO TB12: 500.0000 mg | ORAL_TABLET | Freq: Two times a day (BID) | ORAL | 3 refills | Status: DC

## 2023-09-04 NOTE — Telephone Encounter (Signed)
*  STAT* If patient is at the pharmacy, call can be transferred to refill team.   1. Which medications need to be refilled? (please list name of each medication and dose if known)   clopidogrel (PLAVIX) 75 MG tablet ranolazine (RANEXA) 500 MG 12 hr tablet (Expired)   2. Would you like to learn more about the convenience, safety, & potential cost savings by using the [facility]? No      3. Are you open to using the [facility] (Type [facility]. No    4. Which pharmacy/location (including street and city if local pharmacy) is medication to be sent to? [facility] [place], [place] - [address]    5. Do they need a 30 day or 90 day supply? 90 day     Patient is completely out of Ranexa.

## 2023-09-04 NOTE — Telephone Encounter (Signed)
Pt's medications were sent to pt's pharmacy as requested. Confirmation received.  

## 2023-09-04 NOTE — Telephone Encounter (Signed)
Specialties. Looks like they reordered today.

## 2023-09-06 DIAGNOSIS — Z79899 Other long term (current) drug therapy: Secondary | ICD-10-CM | POA: Diagnosis not present

## 2023-09-07 LAB — BASIC METABOLIC PANEL
BUN/Creatinine Ratio: 17 (ref 9–23)
BUN: 27 mg/dL — ABNORMAL HIGH (ref 6–24)
CO2: 23 mmol/L (ref 20–29)
Calcium: 8.8 mg/dL (ref 8.7–10.2)
Chloride: 104 mmol/L (ref 96–106)
Creatinine, Ser: 1.56 mg/dL — ABNORMAL HIGH (ref 0.57–1.00)
Glucose: 92 mg/dL (ref 70–99)
Potassium: 4.5 mmol/L (ref 3.5–5.2)
Sodium: 139 mmol/L (ref 134–144)
eGFR: 43 mL/min/{1.73_m2} — ABNORMAL LOW (ref >59)

## 2023-09-09 NOTE — Progress Notes (Unsigned)
Cardiology Office Note:  .   Date:  09/10/2023  ID:  Renee Bryant, DOB 1980-12-20, MRN [id number] PCP: Clayborne Dana, NP  [facility] Providers Cardiologist:  Reatha Harps, MD    History of Present Illness: .   Renee Bryant is a 42 y.o. female with history of CAD, HFmEF, HLD, DM, HIV, tobacco abuse who presents for follow-up.   Discussed the use of AI scribe software for clinical note transcription with the patient, who gave verbal consent to proceed.  History of Present Illness   The patient, with a history of non-ST elevation myocardial infarction (NSTEMI) in June 2024, heart failure with mid-range ejection fraction (45-50%), and diabetes, presents for follow-up. Reports an isolated episode of chest pain, but no further episodes. She also has nitroglycerin on hand, but has not needed to use it. The fluid retention has been problematic, but she reports that it has improved since her medication was changed from metoprolol to carvedilol and Entresto. She has been monitoring her blood pressure and weight at home, with blood pressure readings ranging from 127/88 to 152/96 and weight fluctuating due to fluid retention. She also reports a history of acid reflux, which sometimes makes it difficult to distinguish from her chest pain.  The patient's diabetes management has improved with the use of a Freestyle Libre device, which has helped her regulate her blood sugar levels. Her most recent A1c was 8.7, down from 13 a few months ago. She is currently waiting for a replacement device from the company after her last one malfunctioned.  The patient is a long-term smoker, having smoked for 30 years since she was 88 or 42 years old. She has reduced her smoking from two packs a day to half a pack a day and is trying to quit completely. She has tried using a vape and sugar-free candy as substitutes for smoking, but has found it difficult to quit due to the habit of smoking.             Problem List CAD -NSTEMI 04/2023 -90% D1 -> med management  2. HLD -T chol 214, HDL 33, LDL 139, TG 208 3. HFmEF -EF 45-50% with WMA 4. DM -A1c 8.7 5. HIV 6. HTN 7. Tobacco abuse  -30 pack years     ROS: All other ROS reviewed and negative. Pertinent positives noted in the HPI.     Studies Reviewed: Marland Kitchen       Physical Exam:   VS:  BP 122/84   Pulse 77   Ht 5\' 7"  (1.702 m)   Wt 210 lb 6.4 oz (95.4 kg)   LMP 09/07/2023   SpO2 94%   BMI 32.95 kg/m    Wt Readings from Last 3 Encounters:  09/10/23 210 lb 6.4 oz (95.4 kg)  08/26/23 212 lb (96.2 kg)  08/19/23 215 lb (97.5 kg)    GEN: Well nourished, well developed in no acute distress NECK: No JVD; No carotid bruits CARDIAC: RRR, no murmurs, rubs, gallops RESPIRATORY:  Clear to auscultation without rales, wheezing or rhonchi  ABDOMEN: Soft, non-tender, non-distended EXTREMITIES:  No edema; No deformity  ASSESSMENT AND PLAN: .   Assessment and Plan    Coronary Artery Disease History of non-ST elevation myocardial infarction (NSTEMI) in June 2024 with a 90% diagonal lesion managed medically. Intermittent chest pain managed with daily aspirin and nitroglycerin PRN. -Continue aspirin and Plavix for one year post-NSTEMI. -Continue nitroglycerin PRN for chest pain.  Heart Failure with mid-range ejection fraction (  45-50%) No signs of heart failure or volume overload today. Blood pressure well controlled on carvedilol and Entresto. -Continue carvedilol and Entresto. -Discontinue Lasix due to lack of volume overload.  Type 2 Diabetes Mellitus A1c of 8.7, indicating suboptimal glycemic control. Patient reports improvement with use of Freestyle Libre. -Continue current diabetes management and monitor blood glucose levels with Freestyle Libre.  Hyperlipidemia Pending lipid panel. -Check lipid panel today.  Tobacco Use Disorder Patient continues to smoke, albeit reduced from 2 packs/day to half a pack/day. -Strongly advised  to quit smoking. Consider nicotine replacement therapy. -3 min smoking cessation counseling provided today.   Follow-up in 6 months.              Follow-up: Return in about 6 months (around 03/10/2024).  Time Spent with Patient: I have spent a total of 35 minutes with patient reviewing hospital notes, telemetry, EKGs, labs and examining the patient as well as establishing an assessment and plan that was discussed with the patient.  > 50% of time was spent in direct patient care.  Signed, Lenna Gilford. Flora Lipps, MD, [facility]  [facility]  [address], [address] [place], [place] [address] [phone]  3:54 PM

## 2023-09-10 ENCOUNTER — Ambulatory Visit: Payer: Medicaid Other | Attending: Cardiovascular Disease | Admitting: Cardiovascular Disease

## 2023-09-10 ENCOUNTER — Encounter: Payer: Self-pay | Admitting: Cardiovascular Disease

## 2023-09-10 VITALS — BP 122/84 | HR 77 | Ht 67.0 in | Wt 210.4 lb

## 2023-09-10 DIAGNOSIS — E1159 Type 2 diabetes mellitus with other circulatory complications: Secondary | ICD-10-CM

## 2023-09-10 DIAGNOSIS — E785 Hyperlipidemia, unspecified: Secondary | ICD-10-CM

## 2023-09-10 DIAGNOSIS — F17210 Nicotine dependence, cigarettes, uncomplicated: Secondary | ICD-10-CM

## 2023-09-10 DIAGNOSIS — I5022 Chronic systolic (congestive) heart failure: Secondary | ICD-10-CM

## 2023-09-10 DIAGNOSIS — I2510 Atherosclerotic heart disease of native coronary artery without angina pectoris: Secondary | ICD-10-CM

## 2023-09-10 DIAGNOSIS — Z720 Tobacco use: Secondary | ICD-10-CM | POA: Diagnosis not present

## 2023-09-10 NOTE — Patient Instructions (Signed)
Medication Instructions:  No changes    *If you need a refill on your cardiac medications before your next appointment, please call your pharmacy*   Lab Work:  Lipids  today   If you have labs (blood work) drawn today and your tests are completely normal, you will receive your results only by: MyChart Message (if you have MyChart) OR A paper copy in the mail If you have any lab test that is abnormal or we need to change your treatment, we will call you to review the results.   Testing/Procedures: None    Follow-Up: At [facility], you and your health needs are our priority.  As part of our continuing mission to provide you with exceptional heart care, we have created designated Provider Care Teams.  These Care Teams include your primary Cardiologist (physician) and Advanced Practice Providers (APPs -  Physician Assistants and Nurse Practitioners) who all work together to provide you with the care you need, when you need it.  We recommend signing up for the patient portal called "MyChart".  Sign up information is provided on this After Visit Summary.  MyChart is used to connect with patients for Virtual Visits (Telemedicine).  Patients are able to view lab/test results, encounter notes, upcoming appointments, etc.  Non-urgent messages can be sent to your provider as well.   To learn more about what you can do with MyChart, go to [email or web address].    Your next appointment:   6 month(s)  The format for your next appointment:   In Person  Provider:   Reatha Harps, MD

## 2023-09-11 LAB — LIPID PANEL
Chol/HDL Ratio: 3.3 {ratio} (ref 0.0–4.4)
Cholesterol, Total: 169 mg/dL (ref 100–199)
HDL: 51 mg/dL (ref >39)
LDL Chol Calc (NIH): 98 mg/dL (ref 0–99)
Triglycerides: 112 mg/dL (ref 0–149)
VLDL Cholesterol Cal: 20 mg/dL (ref 5–40)

## 2023-09-17 ENCOUNTER — Other Ambulatory Visit: Payer: Self-pay | Admitting: Cardiovascular Disease

## 2023-09-17 ENCOUNTER — Other Ambulatory Visit (HOSPITAL_BASED_OUTPATIENT_CLINIC_OR_DEPARTMENT_OTHER): Payer: Self-pay

## 2023-09-17 DIAGNOSIS — E785 Hyperlipidemia, unspecified: Secondary | ICD-10-CM

## 2023-09-17 DIAGNOSIS — E782 Mixed hyperlipidemia: Secondary | ICD-10-CM

## 2023-09-17 NOTE — Progress Notes (Signed)
Referral to pharmacy lipid clinic.   Renee Spore T. Flora Lipps, MD, [facility]  [facility]  [address] [place], [place] [address] [phone]  9:22 AM

## 2023-09-17 NOTE — Progress Notes (Signed)
Amb ref to lipid clinic ordered. Pt called

## 2023-09-19 ENCOUNTER — Telehealth: Payer: Self-pay | Admitting: Family Medicine

## 2023-09-19 MED ORDER — INSULIN LISPRO (1 UNIT DIAL) 100 UNIT/ML (KWIKPEN): 0.0000 [IU] | PEN_INJECTOR | Freq: Three times a day (TID) | SUBCUTANEOUS | 0 refills | Status: DC

## 2023-09-19 NOTE — Telephone Encounter (Signed)
Prescription Request  09/19/2023  Is this a "Controlled Substance" medicine? No  LOV: 08/19/2023  What is the name of the medication or equipment?insulin lispro (HUMALOG) 100 UNIT/ML KwikPen   Have you contacted your pharmacy to request a refill? No   Which pharmacy would you like this sent to?   [facility] [place], [place] - [address] [place] [address] Phone: [phone] Fax: [phone]    Patient notified that their request is being sent to the clinical staff for review and that they should receive a response within 2 business days.   Please advise at [facility] [phone]

## 2023-09-19 NOTE — Telephone Encounter (Signed)
Sent until patient can establish with endocrinology.

## 2023-09-19 NOTE — Addendum Note (Signed)
Addended bySilvio Pate on: 09/19/2023 03:17 PM   Modules accepted: Orders

## 2023-09-23 NOTE — Addendum Note (Signed)
Addended by: Lindell Spar on: 09/23/2023 08:17 AM   Modules accepted: Orders

## 2023-09-27 ENCOUNTER — Ambulatory Visit: Payer: Medicaid Other | Admitting: Cardiovascular Disease

## 2023-09-27 DIAGNOSIS — Z419 Encounter for procedure for purposes other than remedying health state, unspecified: Secondary | ICD-10-CM | POA: Diagnosis not present

## 2023-10-10 ENCOUNTER — Ambulatory Visit: Payer: Medicaid Other | Attending: Cardiology

## 2023-10-10 NOTE — Progress Notes (Deleted)
Patient ID: Renee Bryant                 DOB: 1981/02/10                    MRN: [id number]     HPI: Renee Bryant is a 42 y.o. female patient referred to lipid clinic by Dr Flora Lipps. [facility] is significant for HTN, NSTEMI, CKD, smoking, uncontrolled T2DM, HIV, and HLD.   Patient had NSTEMI in June 2024. Reduced smoking from 2 ppd to 1/2 ppd. A1c has reduced from 13.1 to 8.7 in last 6 months.  Current Medications:  Atorvastatin 80mg  daily  Intolerances:  Risk Factors:  NSTEMI CAD Smoking T2DM CHF  LDL goal: <55  Diet:   Exercise:   Family History:   Social History:   Labs: TC 169, Trigs 112, HDL 51, LDL 98 (09/10/23  Past Medical History:  Diagnosis Date   Abscess    Asthma    CHF (congestive heart failure) (HCC)    COPD (chronic obstructive pulmonary disease) (HCC)    Coronary artery disease    Diabetes mellitus without complication (HCC)    Emphysema of lung (HCC)    HIV (human immunodeficiency virus infection) (HCC)    Hypertension    Pneumonia     Current Outpatient Medications on File Prior to Visit  Medication Sig Dispense Refill   albuterol (VENTOLIN HFA) 108 (90 Base) MCG/ACT inhaler Inhale 1-2 puffs into the lungs every 6 (six) hours as needed for wheezing or shortness of breath. 1 each 0   aspirin 81 MG chewable tablet Chew 1 tablet (81 mg total) by mouth daily.     atorvastatin (LIPITOR) 80 MG tablet Take 1 tablet (80 mg total) by mouth daily. 90 tablet 1   bictegravir-emtricitabine-tenofovir AF (BIKTARVY) 50-200-25 MG TABS tablet Take by mouth daily.     carvedilol (COREG) 3.125 MG tablet Take 1 tablet (3.125 mg total) by mouth 2 (two) times daily with a meal. 180 tablet 3   clonazePAM (KLONOPIN) 0.5 MG tablet Take 1 mg by mouth 2 (two) times daily as needed for anxiety.     clopidogrel (PLAVIX) 75 MG tablet Take 1 tablet (75 mg total) by mouth daily. 90 tablet 3   Continuous Glucose Sensor (FREESTYLE LIBRE 3 SENSOR) MISC Use to check blood glucose  continuously. 2 each 2   furosemide (LASIX) 20 MG tablet Take 0.5-1 tablets (10-20 mg total) by mouth every other day. (Patient not taking: Reported on 09/10/2023) 15 tablet 0   insulin glargine (LANTUS) 100 UNIT/ML injection Inject 30 Units into the skin at bedtime.     insulin lispro (HUMALOG) 100 UNIT/ML KwikPen Inject 0-11 Units into the skin 3 (three) times daily with meals based on sliding scale: Blood Glucose <150= 0 unit; BG 150-200= 1 unit; BG 201-250= 3 unit; BG 251-300= 5 unit; BG 301-350= 7 unit; BG 351-400= 9 unit; BG >400= 11 unit and Call Primary Care. 30 mL 0   Insulin Pen Needle 32G X 4 MM MISC Use 3 times a day with Humalog Kwik pen 100 each 2   Insulin Syringe-Needle U-100 (GNP INSULIN SYRINGES 31GX5/16") 31G X 5/16" 0.3 ML MISC Use to inject Lantus once a day 100 each 0   nitroGLYCERIN (NITROSTAT) 0.4 MG SL tablet Place 1 tablet (0.4 mg total) under the tongue every 5 (five) minutes as needed for chest pain. 30 tablet 3   ondansetron (ZOFRAN-ODT) 4 MG disintegrating tablet Take 1 tablet (4 mg total)  by mouth every 8 (eight) hours as needed for nausea or vomiting. 10 tablet 0   ranolazine (RANEXA) 500 MG 12 hr tablet Take 1 tablet (500 mg total) by mouth 2 (two) times daily. 180 tablet 3   sacubitril-valsartan (ENTRESTO) 24-26 MG Take 1 tablet by mouth 2 (two) times daily. 60 tablet 3   sacubitril-valsartan (ENTRESTO) 24-26 MG Take 1 tablet by mouth 2 (two) times daily. 28 tablet 0   valACYclovir (VALTREX) 500 MG tablet Take 500 mg by mouth 2 (two) times daily as needed (For cold sores).     Vitamin D, Ergocalciferol, (DRISDOL) 1.25 MG (50000 UNIT) CAPS capsule Take 1 capsule (50,000 Units total) by mouth every 7 (seven) days. 8 capsule 0   No current facility-administered medications on file prior to visit.    Allergies  Allergen Reactions   Ozempic (0.25 Or 0.5 Mg-Dose) [Semaglutide(0.25 Or 0.5mg -Dos)] Nausea Only   Jardiance [Empagliflozin] Other (See Comments)    Yeast  infections   Metformin And Related Other (See Comments)    Flu-like symptoms   Wellbutrin [Bupropion]     unknown   Trulicity [Dulaglutide] Rash    Assessment/Plan:  1. Hyperlipidemia -  Reviewed options for lowering LDL cholesterol, including statins, ezetimibe, PCSK9 inhibitors, Nexletol/Nexlizet, and Leqvio. Discussed efficacy, dosing, side effects, and copay information.

## 2023-10-15 ENCOUNTER — Encounter: Payer: Self-pay | Admitting: Pharmacist

## 2023-10-15 ENCOUNTER — Other Ambulatory Visit (HOSPITAL_COMMUNITY): Payer: Self-pay

## 2023-10-15 ENCOUNTER — Ambulatory Visit: Payer: Medicaid Other | Attending: Cardiology | Admitting: Pharmacist

## 2023-10-15 ENCOUNTER — Telehealth: Payer: Self-pay | Admitting: Pharmacy Technician

## 2023-10-15 ENCOUNTER — Telehealth: Payer: Self-pay | Admitting: Pharmacist

## 2023-10-15 DIAGNOSIS — E785 Hyperlipidemia, unspecified: Secondary | ICD-10-CM

## 2023-10-15 DIAGNOSIS — I2510 Atherosclerotic heart disease of native coronary artery without angina pectoris: Secondary | ICD-10-CM

## 2023-10-15 DIAGNOSIS — I214 Non-ST elevation (NSTEMI) myocardial infarction: Secondary | ICD-10-CM | POA: Diagnosis not present

## 2023-10-15 NOTE — Telephone Encounter (Signed)
Please complete PA for Repatha 

## 2023-10-15 NOTE — Progress Notes (Signed)
Patient ID: Renee Bryant                 DOB: Oct 12, 1981                    MRN: [id number]     HPI: Renee Bryant is a 42 y.o. female patient referred to lipid clinic by Dr Flora Lipps. [name] is significant for HTN, NSTEMI, CKD, smoking, uncontrolled T2DM, HIV, and HLD.   Patient presents today to discuss cholesterol management. Had NSTEMI on 05/01/23. Thought she was having indigestion. Only reported to ED because her blood sugar was >500.   After hospitalization she reduced her smoking from 2 ppd to 1/2 ppd. Was started on [facility] by endocrinology and it has made a huge difference for her. A1c has decreased from 13.1 to 8.5 in last 6 months. Currently on basal bolus insulin regimen which is keeping her glucose better controlled but she has been gaining weight. Tried Ozempic over the Summer but she could not tolerate.  Works as a Lawyer for an elderly lady. Arrived at patient's house at 6pm and does not leave until next morning. Has 41 year old at home.  Current Medications:  Atorvastatin 80mg  daily  Risk Factors:  NSTEMI CAD Smoking T2DM CHF  LDL goal: <55  Labs: TC 169, Trigs 112, HDL 51, LDL 98 (09/10/23) LPA 16.6 (05/03/23)  Past Medical History:  Diagnosis Date   Abscess    Asthma    CHF (congestive heart failure) (HCC)    COPD (chronic obstructive pulmonary disease) (HCC)    Coronary artery disease    Diabetes mellitus without complication (HCC)    Emphysema of lung (HCC)    HIV (human immunodeficiency virus infection) (HCC)    Hypertension    Pneumonia     Current Outpatient Medications on File Prior to Visit  Medication Sig Dispense Refill   albuterol (VENTOLIN HFA) 108 (90 Base) MCG/ACT inhaler Inhale 1-2 puffs into the lungs every 6 (six) hours as needed for wheezing or shortness of breath. 1 each 0   aspirin 81 MG chewable tablet Chew 1 tablet (81 mg total) by mouth daily.     atorvastatin (LIPITOR) 80 MG tablet Take 1 tablet (80 mg total) by mouth daily. 90  tablet 1   bictegravir-emtricitabine-tenofovir AF (BIKTARVY) 50-200-25 MG TABS tablet Take by mouth daily.     carvedilol (COREG) 3.125 MG tablet Take 1 tablet (3.125 mg total) by mouth 2 (two) times daily with a meal. 180 tablet 3   clonazePAM (KLONOPIN) 0.5 MG tablet Take 1 mg by mouth 2 (two) times daily as needed for anxiety.     clopidogrel (PLAVIX) 75 MG tablet Take 1 tablet (75 mg total) by mouth daily. 90 tablet 3   Continuous Glucose Sensor (FREESTYLE LIBRE 3 SENSOR) MISC Use to check blood glucose continuously. 2 each 2   furosemide (LASIX) 20 MG tablet Take 0.5-1 tablets (10-20 mg total) by mouth every other day. (Patient not taking: Reported on 09/10/2023) 15 tablet 0   insulin glargine (LANTUS) 100 UNIT/ML injection Inject 30 Units into the skin at bedtime.     insulin lispro (HUMALOG) 100 UNIT/ML KwikPen Inject 0-11 Units into the skin 3 (three) times daily with meals based on sliding scale: Blood Glucose <150= 0 unit; BG 150-200= 1 unit; BG 201-250= 3 unit; BG 251-300= 5 unit; BG 301-350= 7 unit; BG 351-400= 9 unit; BG >400= 11 unit and Call Primary Care. 30 mL 0   Insulin Pen  Needle 32G X 4 MM MISC Use 3 times a day with Humalog Kwik pen 100 each 2   Insulin Syringe-Needle U-100 (GNP INSULIN SYRINGES 31GX5/16") 31G X 5/16" 0.3 ML MISC Use to inject Lantus once a day 100 each 0   nitroGLYCERIN (NITROSTAT) 0.4 MG SL tablet Place 1 tablet (0.4 mg total) under the tongue every 5 (five) minutes as needed for chest pain. 30 tablet 3   ondansetron (ZOFRAN-ODT) 4 MG disintegrating tablet Take 1 tablet (4 mg total) by mouth every 8 (eight) hours as needed for nausea or vomiting. 10 tablet 0   ranolazine (RANEXA) 500 MG 12 hr tablet Take 1 tablet (500 mg total) by mouth 2 (two) times daily. 180 tablet 3   sacubitril-valsartan (ENTRESTO) 24-26 MG Take 1 tablet by mouth 2 (two) times daily. 60 tablet 3   sacubitril-valsartan (ENTRESTO) 24-26 MG Take 1 tablet by mouth 2 (two) times daily. 28 tablet  0   valACYclovir (VALTREX) 500 MG tablet Take 500 mg by mouth 2 (two) times daily as needed (For cold sores).     Vitamin D, Ergocalciferol, (DRISDOL) 1.25 MG (50000 UNIT) CAPS capsule Take 1 capsule (50,000 Units total) by mouth every 7 (seven) days. 8 capsule 0   No current facility-administered medications on file prior to visit.    Allergies  Allergen Reactions   Ozempic (0.25 Or 0.5 Mg-Dose) [Semaglutide(0.25 Or 0.5mg -Dos)] Nausea Only   Jardiance [Empagliflozin] Other (See Comments)    Yeast infections   Metformin And Related Other (See Comments)    Flu-like symptoms   Wellbutrin [Bupropion]     unknown   Trulicity [Dulaglutide] Rash    Assessment/Plan:  1. Hyperlipidemia - Patient last LDL 98 which is above goal of <55. Patient high risk for future cardiac events. Recommend continuing atorvastatin and starting Repatha.  Using demo pen, educated patient on mechanism of action, storage, site selection, administration, and possible adverse effects. Will complete PA and contact patient when approved. Recheck fasting lipid panel in 2-3 months.  Continue atorvastatin 80mg  daily Start Repatha 140mg  q 2 weeks Recheck fasting lipid panel in 2-3 months  Laural Golden, PharmD, BCACP, CDCES, CPP [address] [place], [place], [address] Phone: [phone], Fax: [phone]

## 2023-10-15 NOTE — Telephone Encounter (Signed)
Pharmacy Patient Advocate Encounter   Received notification from Pt Calls Messages that prior authorization for repatha is required/requested.   Insurance verification completed.   The patient is insured through [facility] [place] .   Per test claim: PA required; PA submitted to above mentioned insurance via CoverMyMeds Key/confirmation #/EOC [id number] Status is pending

## 2023-10-15 NOTE — Patient Instructions (Addendum)
It was nice meeting you today  We would like your LDL (bad cholesterol) to be less than 55  Please continue your atorvastatin 80mg  daily  The medication we discussed today is called Repatha which is an injection you would take once every 2 weeks  I will complete the prior authorization for you and contact you when it is approved  Once you start the medication we will recheck your fasting lipid panel in 2-3 months  Please let us know if you have any questions  Laural Golden, PharmD, BCACP, CDCES, CPP [address], [address] [place], [place], [address] Phone: [phone]

## 2023-10-16 ENCOUNTER — Other Ambulatory Visit (HOSPITAL_COMMUNITY): Payer: Self-pay

## 2023-10-16 MED ORDER — REPATHA SURECLICK 140 MG/ML ~~LOC~~ SOAJ: 1.0000 mL | SUBCUTANEOUS | 5 refills | Status: AC

## 2023-10-16 NOTE — Addendum Note (Signed)
Addended by: Cheree Ditto on: 10/16/2023 01:42 PM   Modules accepted: Orders

## 2023-10-16 NOTE — Telephone Encounter (Signed)
Pharmacy Patient Advocate Encounter  Received notification from [facility] [employer] that Prior Authorization for repatha has been APPROVED from 10/15/23 to 11/25/2098. Ran test claim, Copay is $4.00- one month. This test claim was processed through [facility]- copay amounts may vary at other pharmacies due to pharmacy/plan contracts, or as the patient moves through the different stages of their insurance plan.   PA #/Case ID/Reference #: [id number]

## 2023-10-18 DIAGNOSIS — J209 Acute bronchitis, unspecified: Secondary | ICD-10-CM | POA: Diagnosis not present

## 2023-10-18 DIAGNOSIS — R059 Cough, unspecified: Secondary | ICD-10-CM | POA: Diagnosis not present

## 2023-10-18 DIAGNOSIS — Z20822 Contact with and (suspected) exposure to covid-19: Secondary | ICD-10-CM | POA: Diagnosis not present

## 2023-10-21 ENCOUNTER — Other Ambulatory Visit: Payer: Self-pay

## 2023-10-21 ENCOUNTER — Ambulatory Visit (HOSPITAL_BASED_OUTPATIENT_CLINIC_OR_DEPARTMENT_OTHER)
Admission: RE | Admit: 2023-10-21 | Discharge: 2023-10-21 | Disposition: A | Discharge status: Home / Self Care | Payer: Medicaid Other | Source: Ambulatory Visit | Attending: Medical | Admitting: Medical

## 2023-10-21 ENCOUNTER — Emergency Department (HOSPITAL_BASED_OUTPATIENT_CLINIC_OR_DEPARTMENT_OTHER): Payer: Medicaid Other

## 2023-10-21 ENCOUNTER — Encounter (HOSPITAL_BASED_OUTPATIENT_CLINIC_OR_DEPARTMENT_OTHER): Payer: Self-pay

## 2023-10-21 ENCOUNTER — Emergency Department (HOSPITAL_BASED_OUTPATIENT_CLINIC_OR_DEPARTMENT_OTHER)
Admission: EM | Admit: 2023-10-21 | Discharge: 2023-10-21 | Disposition: A | Discharge status: Home / Self Care | Payer: Medicaid Other | Attending: Emergency Medicine | Admitting: Emergency Medicine

## 2023-10-21 ENCOUNTER — Encounter: Payer: Self-pay | Admitting: Medical

## 2023-10-21 ENCOUNTER — Ambulatory Visit: Payer: Medicaid Other | Admitting: Medical

## 2023-10-21 VITALS — BP 140/88 | HR 79 | Ht 67.0 in | Wt 220.8 lb

## 2023-10-21 DIAGNOSIS — Z794 Long term (current) use of insulin: Secondary | ICD-10-CM | POA: Insufficient documentation

## 2023-10-21 DIAGNOSIS — R0600 Dyspnea, unspecified: Secondary | ICD-10-CM

## 2023-10-21 DIAGNOSIS — J440 Chronic obstructive pulmonary disease with acute lower respiratory infection: Secondary | ICD-10-CM | POA: Diagnosis not present

## 2023-10-21 DIAGNOSIS — R600 Localized edema: Secondary | ICD-10-CM | POA: Diagnosis not present

## 2023-10-21 DIAGNOSIS — R051 Acute cough: Secondary | ICD-10-CM

## 2023-10-21 DIAGNOSIS — Z7902 Long term (current) use of antithrombotics/antiplatelets: Secondary | ICD-10-CM | POA: Insufficient documentation

## 2023-10-21 DIAGNOSIS — J209 Acute bronchitis, unspecified: Secondary | ICD-10-CM

## 2023-10-21 DIAGNOSIS — E1165 Type 2 diabetes mellitus with hyperglycemia: Secondary | ICD-10-CM

## 2023-10-21 DIAGNOSIS — R059 Cough, unspecified: Secondary | ICD-10-CM | POA: Diagnosis not present

## 2023-10-21 DIAGNOSIS — R062 Wheezing: Secondary | ICD-10-CM | POA: Insufficient documentation

## 2023-10-21 DIAGNOSIS — N189 Chronic kidney disease, unspecified: Secondary | ICD-10-CM | POA: Insufficient documentation

## 2023-10-21 DIAGNOSIS — I252 Old myocardial infarction: Secondary | ICD-10-CM | POA: Diagnosis not present

## 2023-10-21 DIAGNOSIS — R0602 Shortness of breath: Secondary | ICD-10-CM | POA: Diagnosis present

## 2023-10-21 DIAGNOSIS — J40 Bronchitis, not specified as acute or chronic: Secondary | ICD-10-CM

## 2023-10-21 DIAGNOSIS — Z7982 Long term (current) use of aspirin: Secondary | ICD-10-CM | POA: Diagnosis not present

## 2023-10-21 LAB — COMPREHENSIVE METABOLIC PANEL
AG Ratio: 1.1 (calc) (ref 1.0–2.5)
ALT: 26 U/L (ref 6–29)
AST: 23 U/L (ref 10–30)
Albumin: 2.9 g/dL — ABNORMAL LOW (ref 3.6–5.1)
Alkaline phosphatase (APISO): 53 U/L (ref 31–125)
BUN/Creatinine Ratio: 23 (calc) — ABNORMAL HIGH (ref 6–22)
BUN: 44 mg/dL — ABNORMAL HIGH (ref 7–25)
CO2: 26 mmol/L (ref 20–32)
Calcium: 8.1 mg/dL — ABNORMAL LOW (ref 8.6–10.2)
Chloride: 103 mmol/L (ref 98–110)
Creat: 1.91 mg/dL — ABNORMAL HIGH (ref 0.50–0.99)
Globulin: 2.7 g/dL (ref 1.9–3.7)
Glucose, Bld: 342 mg/dL — ABNORMAL HIGH (ref 65–99)
Potassium: 6.2 mmol/L (ref 3.5–5.3)
Sodium: 131 mmol/L — ABNORMAL LOW (ref 135–146)
Total Bilirubin: 0.3 mg/dL (ref 0.2–1.2)
Total Protein: 5.6 g/dL — ABNORMAL LOW (ref 6.1–8.1)

## 2023-10-21 LAB — POCT I-STAT, CHEM 8
BUN: 40 mg/dL — ABNORMAL HIGH (ref 6–20)
Calcium, Ion: 1.14 mmol/L — ABNORMAL LOW (ref 1.15–1.40)
Chloride: 101 mmol/L (ref 98–111)
Creatinine, Ser: 1.9 mg/dL — ABNORMAL HIGH (ref 0.44–1.00)
Glucose, Bld: 349 mg/dL — ABNORMAL HIGH (ref 70–99)
HCT: 35 % — ABNORMAL LOW (ref 36.0–46.0)
Hemoglobin: 11.9 g/dL — ABNORMAL LOW (ref 12.0–15.0)
Potassium: 5.2 mmol/L — ABNORMAL HIGH (ref 3.5–5.1)
Sodium: 133 mmol/L — ABNORMAL LOW (ref 135–145)
TCO2: 23 mmol/L (ref 22–32)

## 2023-10-21 LAB — D-DIMER, QUANTITATIVE: D-Dimer, Quant: 0.69 ug{FEU}/mL — ABNORMAL HIGH (ref <0.50)

## 2023-10-21 LAB — TROPONIN I (HIGH SENSITIVITY): Troponin I (High Sensitivity): 4 ng/L (ref <18)

## 2023-10-21 LAB — CBC WITH DIFFERENTIAL/PLATELET
Absolute Lymphocytes: 1043 {cells}/uL (ref 850–3900)
Absolute Monocytes: 226 {cells}/uL (ref 200–950)
Basophils Absolute: 56 {cells}/uL (ref 0–200)
Basophils Relative: 0.6 %
Eosinophils Absolute: 19 {cells}/uL (ref 15–500)
Eosinophils Relative: 0.2 %
HCT: 38.2 % (ref 35.0–45.0)
Hemoglobin: 12.8 g/dL (ref 11.7–15.5)
MCH: 33.6 pg — ABNORMAL HIGH (ref 27.0–33.0)
MCHC: 33.5 g/dL (ref 32.0–36.0)
MCV: 100.3 fL — ABNORMAL HIGH (ref 80.0–100.0)
MPV: 10.5 fL (ref 7.5–12.5)
Monocytes Relative: 2.4 %
Neutro Abs: 8056 {cells}/uL — ABNORMAL HIGH (ref 1500–7800)
Neutrophils Relative %: 85.7 %
Platelets: 272 10*3/uL (ref 140–400)
RBC: 3.81 10*6/uL (ref 3.80–5.10)
RDW: 11.9 % (ref 11.0–15.0)
Total Lymphocyte: 11.1 %
WBC: 9.4 10*3/uL (ref 3.8–10.8)

## 2023-10-21 LAB — BASIC METABOLIC PANEL
Anion gap: 4 — ABNORMAL LOW (ref 5–15)
BUN: 45 mg/dL — ABNORMAL HIGH (ref 6–20)
CO2: 26 mmol/L (ref 22–32)
Calcium: 8.2 mg/dL — ABNORMAL LOW (ref 8.9–10.3)
Chloride: 101 mmol/L (ref 98–111)
Creatinine, Ser: 1.83 mg/dL — ABNORMAL HIGH (ref 0.44–1.00)
GFR, Estimated: 35 mL/min — ABNORMAL LOW (ref >60)
Glucose, Bld: 343 mg/dL — ABNORMAL HIGH (ref 70–99)
Potassium: 5.1 mmol/L (ref 3.5–5.1)
Sodium: 131 mmol/L — ABNORMAL LOW (ref 135–145)

## 2023-10-21 LAB — CBC
HCT: 35.2 % — ABNORMAL LOW (ref 36.0–46.0)
Hemoglobin: 12 g/dL (ref 12.0–15.0)
MCH: 33.1 pg (ref 26.0–34.0)
MCHC: 34.1 g/dL (ref 30.0–36.0)
MCV: 97 fL (ref 80.0–100.0)
Platelets: 272 10*3/uL (ref 150–400)
RBC: 3.63 MIL/uL — ABNORMAL LOW (ref 3.87–5.11)
RDW: 12.1 % (ref 11.5–15.5)
WBC: 13.1 10*3/uL — ABNORMAL HIGH (ref 4.0–10.5)
nRBC: 0 % (ref 0.0–0.2)

## 2023-10-21 LAB — BRAIN NATRIURETIC PEPTIDE: Brain Natriuretic Peptide: 37 pg/mL (ref <100)

## 2023-10-21 LAB — GLUCOSE, CAPILLARY: Glucose-Capillary: 310 mg/dL — ABNORMAL HIGH (ref 70–99)

## 2023-10-21 LAB — TROPONIN I: Troponin I: 5 ng/L (ref <=47)

## 2023-10-21 MED ORDER — IPRATROPIUM-ALBUTEROL 0.5-2.5 (3) MG/3ML IN SOLN
3.0000 mL | Freq: Once | RESPIRATORY_TRACT | Status: AC
  Administered 2023-10-21: 3 mL via RESPIRATORY_TRACT
  Filled 2023-10-21: qty 3

## 2023-10-21 NOTE — Discharge Instructions (Signed)
Continue antibiotic and steroids.  Return if symptoms worsen.  Your troponin and heart lab was normal today.  Your potassium is improved and I suspect lab error earlier today.  Albuterol continue to lower your potassium as well.

## 2023-10-21 NOTE — ED Notes (Signed)
Patient transported to X-ray 

## 2023-10-21 NOTE — ED Triage Notes (Signed)
Pt reports shortness of breath since Friday. She went to Urgent Care on Thursday for a cough and was prescribed Prednisone, Azithromycin and Tessalon Perles. She then saw her PCP today who did blood work and told her her potassium was 6.2. She also reports chest pain that started this morning. Hx of heart attack a few month ago.

## 2023-10-21 NOTE — Patient Instructions (Addendum)
Asthmatic Bronchitis(by lung exam concern for possible infectious component) Acute exacerbation with persistent cough, wheezing, and dyspnea. No improvement with current treatment (Prednisone 20mg  daily, Zithromax, and Albuterol inhaler). No signs of infection in sputum. -Order CBC, CMP, BNP, D-dimer, chest x-ray, and troponin stat to rule out other causes of symptoms. -Consider changing antibiotic to Doxycycline and increasing Prednisone dose depending on lab results. Will first see by labs if able to manage out pt or if needs ED evaluation. -needed to expidite pt to lab before they closed.  Diabetes Mellitus Poor glycemic control due to Prednisone use. Currently on Lantus 36 units at night and Humalog 7 base units plus sliding scale. -Increase sliding scale Humalog by 2 units above her current regimen. -Monitor blood glucose closely and go to the emergency department if blood glucose exceeds 400.  Tobacco Use Heavy smoking history with recent reduction to 3 cigarettes per day due to illness. -Encourage continued smoking cessation.  Angina No current chest pain reported. Patient has nitroglycerin and chewable baby aspirin on hand for chest pain. -Continue current management.  Follow-up date to be determined after lab review. -explained to pt since short week if not significantly improving or if urgent or emergent lab results or imaging studies will need to be seen in ED  Also pt ckd and hiv to consider.  Close monitoring of symptoms and blood glucose required. If symptoms worsen or blood glucose exceeds 400, patient should go to the emergency department. Follow-up after lab results are available.  After hours saw d dimer elevated and potassium came back 6.2. Advised to go to ED. He went to ED. Below is summary from ED in "   "Jasman Veith is here for lab error, bronchitis symptoms.  Unremarkable vitals.  No fever.  She is on Z-Pak [employer] and prednisone for bronchitis and  asthma exacerbation.  She was at primary care doctor's office today following up an urgent care visit and they did blood work and her potassium was elevated to 6.2.  Today it is 5.2.  EKG shows sinus rhythm.  No ischemic changes.  No hyperkalemic changes.  Overall suspect this was a lab error.  She had an episode of some chest discomfort but related to coughing.  She had labs done in triage but she already had a negative troponin outpatient today and had a repeat troponin today at visit that is also normal.  She had elevated D-dimer outpatient but I have no concern for blood clot.  Patient does not want a CT scanning of her chest because of chronic kidney disease I agree that this does not seem likely.  She is actually PERC negative and I have no concern for PE and D-dimer likely nonspecific.  She is got wheezing on exam but she is well-appearing.  Gave her breathing treatment.  Lab work is otherwise unremarkable.  She had a chest x-ray done earlier today that also showed no evidence of pneumonia.  I recommend that she complete her treatment and follow-up with her primary care doctor.  Discharged in good condition."  As discussed during off visit  will increase dose of prednisone. 6 day taper dose. Discussed on how would increase dosing sliding scale humalog. Explained sugar may go up but goal is to keep less than 400. In event spiking above 400 then have to again advise ED evaluation.

## 2023-10-21 NOTE — ED Provider Notes (Signed)
[place] [facility] Provider Note   CSN: [id number] Arrival date & time: 10/21/23  2108     History  Chief Complaint  Patient presents with   Shortness of Breath    Renee Bryant is a 42 y.o. female.  Patient here because she was told she had elevated potassium at 6.2.  She had blood work done outpatient today she has been dealing with bronchitis and on prednisone and Z-Pak.  She has felt better but still has coughing attacks at times.  Denies any fevers or chills.  Had some chest discomfort today but resolved.  She has a history of heart attack.  She has history of chronic kidney disease.  She was told that her 1 test was elevated and that she might need a CT scan to evaluate for blood clot but she does not want to get this done.  She feels like this is a bronchitis.  She has not had any recent surgery or travel.  She is not on any estrogen.  The history is provided by the patient.       Home Medications Prior to Admission medications   Medication Sig Start Date End Date Taking? Authorizing Provider  albuterol (VENTOLIN HFA) 108 (90 Base) MCG/ACT inhaler Inhale 1-2 puffs into the lungs every 6 (six) hours as needed for wheezing or shortness of breath. 07/22/23   Wanda Plump, MD  aspirin 81 MG chewable tablet Chew 1 tablet (81 mg total) by mouth daily. 05/03/23   Danford, Earl Lites, MD  atorvastatin (LIPITOR) 80 MG tablet Take 1 tablet (80 mg total) by mouth daily. 07/30/23   Bradd Canary, MD  bictegravir-emtricitabine-tenofovir AF (BIKTARVY) 50-200-25 MG TABS tablet Take by mouth daily.    [provider]  carvedilol (COREG) 3.125 MG tablet Take 1 tablet (3.125 mg total) by mouth 2 (two) times daily with a meal. 08/26/23   Wittenborn, Gavin Pound, NP  clonazePAM (KLONOPIN) 0.5 MG tablet Take 1 mg by mouth 2 (two) times daily as needed for anxiety.    [provider]  clopidogrel (PLAVIX) 75 MG tablet Take 1 tablet (75 mg total)  by mouth daily. 09/04/23   O'NealRonnald Ramp, MD  Continuous Glucose Sensor (FREESTYLE LIBRE 3 SENSOR) MISC Use to check blood glucose continuously. 07/22/23   Wanda Plump, MD  Evolocumab (REPATHA SURECLICK) 140 MG/ML SOAJ Inject 140 mg into the skin every 14 (fourteen) days. Patient not taking: Reported on 10/21/2023 10/16/23   Sande Rives, MD  furosemide (LASIX) 20 MG tablet Take 0.5-1 tablets (10-20 mg total) by mouth every other day. Patient not taking: Reported on 09/10/2023 08/07/23   Wanda Plump, MD  insulin glargine (LANTUS) 100 UNIT/ML injection Inject 30 Units into the skin at bedtime.    [provider]  insulin lispro (HUMALOG) 100 UNIT/ML KwikPen Inject 0-11 Units into the skin 3 (three) times daily with meals based on sliding scale: Blood Glucose <150= 0 unit; BG 150-200= 1 unit; BG 201-250= 3 unit; BG 251-300= 5 unit; BG 301-350= 7 unit; BG 351-400= 9 unit; BG >400= 11 unit and Call Primary Care. 09/19/23   Clayborne Dana, NP  Insulin Pen Needle 32G X 4 MM MISC Use 3 times a day with Humalog Kwik pen 07/22/23   Wanda Plump, MD  Insulin Syringe-Needle U-100 [employer] INSULIN SYRINGES 31GX5/16") 31G X 5/16" 0.3 ML MISC Use to inject Lantus once a day 07/22/23   Wanda Plump, MD  nitroGLYCERIN (NITROSTAT) 0.4 MG SL tablet Place 1 tablet (0.4 mg total) under the tongue every 5 (five) minutes as needed for chest pain. 08/26/23 11/24/23  Carlos Levering, NP  ondansetron (ZOFRAN-ODT) 4 MG disintegrating tablet Take 1 tablet (4 mg total) by mouth every 8 (eight) hours as needed for nausea or vomiting. 06/26/23   Renne Crigler, PA-C  ranolazine (RANEXA) 500 MG 12 hr tablet Take 1 tablet (500 mg total) by mouth 2 (two) times daily. 09/04/23   O'NealRonnald Ramp, MD  sacubitril-valsartan (ENTRESTO) 24-26 MG Take 1 tablet by mouth 2 (two) times daily. 08/26/23   Carlos Levering, NP  sacubitril-valsartan (ENTRESTO) 24-26 MG Take 1 tablet by mouth 2 (two) times daily. 08/26/23    Carlos Levering, NP  valACYclovir (VALTREX) 500 MG tablet Take 500 mg by mouth 2 (two) times daily as needed (For cold sores).    [provider]  Vitamin D, Ergocalciferol, (DRISDOL) 1.25 MG (50000 UNIT) CAPS capsule Take 1 capsule (50,000 Units total) by mouth every 7 (seven) days. Patient not taking: Reported on 10/21/2023 08/20/23   Clayborne Dana, NP      Allergies    Ozempic (0.25 or 0.5 mg-dose) [semaglutide(0.25 or 0.5mg -dos)], Jardiance [empagliflozin], Metformin and related, Wellbutrin [bupropion], and Trulicity [dulaglutide]    Review of Systems   Review of Systems  Physical Exam Updated Vital Signs BP (!) 171/92 (BP Location: Left Arm)   Pulse 89   Temp 98 F (36.7 C)   Resp 20   Ht 5\' 7"  (1.702 m)   Wt 99.8 kg   LMP 10/14/2023 (Approximate)   SpO2 95%   BMI 34.46 kg/m  Physical Exam Vitals and nursing note reviewed.  Constitutional:      General: She is not in acute distress.    Appearance: She is well-developed.  HENT:     Head: Normocephalic and atraumatic.  Eyes:     Conjunctiva/sclera: Conjunctivae normal.     Pupils: Pupils are equal, round, and reactive to light.  Cardiovascular:     Rate and Rhythm: Normal rate and regular rhythm.     Pulses: Normal pulses.     Heart sounds: Normal heart sounds. No murmur heard. Pulmonary:     Effort: Pulmonary effort is normal. No respiratory distress.     Breath sounds: Normal breath sounds.  Abdominal:     Palpations: Abdomen is soft.     Tenderness: There is no abdominal tenderness.  Musculoskeletal:        General: No swelling.     Cervical back: Normal range of motion and neck supple.  Skin:    General: Skin is warm and dry.     Capillary Refill: Capillary refill takes less than 2 seconds.  Neurological:     Mental Status: She is alert.  Psychiatric:        Mood and Affect: Mood normal.     ED Results / Procedures / Treatments   Labs (all labs ordered are listed, but only abnormal  results are displayed) Labs Reviewed  CBC - Abnormal; Notable for the following components:      Result Value   WBC 13.1 (*)    RBC 3.63 (*)    HCT 35.2 (*)    All other components within normal limits  GLUCOSE, CAPILLARY - Abnormal; Notable for the following components:   Glucose-Capillary 310 (*)    All other components within normal limits  POCT I-STAT, CHEM 8 - Abnormal; Notable for the following components:  Sodium 133 (*)    Potassium 5.2 (*)    BUN 40 (*)    Creatinine, Ser 1.90 (*)    Glucose, Bld 349 (*)    Calcium, Ion 1.14 (*)    Hemoglobin 11.9 (*)    HCT 35.0 (*)    All other components within normal limits  BASIC METABOLIC PANEL  I-STAT CHEM 8, ED  CBG MONITORING, ED  TROPONIN I (HIGH SENSITIVITY)    EKG EKG Interpretation Date/Time:  Monday October 21 2023 21:17:29 EST Ventricular Rate:  97 PR Interval:  158 QRS Duration:  72 QT Interval:  348 QTC Calculation: 441 R Axis:   66  Text Interpretation: Normal sinus rhythm When compared with ECG of 01-May-2023 23:55, PREVIOUS ECG IS PRESENT Confirmed by Virgina Norfolk [phone]) on 10/21/2023 9:20:31 PM  Radiology DG Chest 2 View  Result Date: 10/21/2023 CLINICAL DATA:  Cough, wheezing. EXAM: CHEST - 2 VIEW COMPARISON:  August 14, 2023. FINDINGS: The heart size and mediastinal contours are within normal limits. Both lungs are clear. The visualized skeletal structures are unremarkable. IMPRESSION: No active cardiopulmonary disease. Electronically Signed   By: Lupita Raider M.D.   On: 10/21/2023 17:22    Procedures Procedures    Medications Ordered in ED Medications  ipratropium-albuterol (DUONEB) 0.5-2.5 (3) MG/3ML nebulizer solution 3 mL (has no administration in time range)    ED Course/ Medical Decision Making/ A&P                                 Medical Decision Making Amount and/or Complexity of Data Reviewed Labs: ordered.  Risk Prescription drug management.   Kharizma Mosbey is  here for lab error, bronchitis symptoms.  Unremarkable vitals.  No fever.  She is on Z-Pak [employer] and prednisone for bronchitis and asthma exacerbation.  She was at primary care doctor's office today following up an urgent care visit and they did blood work and her potassium was elevated to 6.2.  Today it is 5.2.  EKG shows sinus rhythm.  No ischemic changes.  No hyperkalemic changes.  Overall suspect this was a lab error.  She had an episode of some chest discomfort but related to coughing.  She had labs done in triage but she already had a negative troponin outpatient today and had a repeat troponin today at visit that is also normal.  She had elevated D-dimer outpatient but I have no concern for blood clot.  Patient does not want a CT scanning of her chest because of chronic kidney disease I agree that this does not seem likely.  She is actually PERC negative and I have no concern for PE and D-dimer likely nonspecific.  She is got wheezing on exam but she is well-appearing.  Gave her breathing treatment.  Lab work is otherwise unremarkable.  She had a chest x-ray done earlier today that also showed no evidence of pneumonia.  I recommend that she complete her treatment and follow-up with her primary care doctor.  Discharged in good condition.  This chart was dictated using voice recognition software.  Despite best efforts to proofread,  errors can occur which can change the documentation meaning.         Final Clinical Impression(s) / ED Diagnoses Final diagnoses:  Bronchitis    Rx / DC Orders ED Discharge Orders     None         Virgina Norfolk, DO 10/21/23  2214  

## 2023-10-21 NOTE — Progress Notes (Unsigned)
Subjective:    Patient ID: Renee Bryant, female    DOB: 03/07/1981, 42 y.o.   MRN: [id number]  HPI Discussed the use of AI scribe software for clinical note transcription with the patient, who gave verbal consent to proceed.  History of Present Illness   The patient, a 42 year old with a history of asthmatic bronchitis, diabetes, and a previous heart attack, presents with worsening respiratory symptoms since last Wednesday. She reports extreme fatigue, to the point of being bedridden early on in illness but not presenty, and a persistent cough that has been particularly troublesome when lying down, preventing sleep. The cough is productive, but the sputum is clear.. The patient also reports consistent wheezing and shortness of breath, even with minimal exertion such as moving from one room to another.  The patient sought care at an urgent care facility last Thursday, where she tested negative for COVID-19 and influenza. The provider at the urgent care attributed the symptoms to a flare-up of the patient's asthmatic bronchitis and prescribed prednisone and  Zithromax,. Despite adherence to this regimen, the patient reports no improvement in symptoms and feels she has worsened.  The patient's diabetes management has been complicated by the prednisone treatment, requiring increased insulin administration to maintain blood glucose control. She is currently on 36 units of Lantus and a sliding scale of Humalog, with a base of 7 units. Despite these adjustments, the patient reports having to administer 14-15 units of insulin to counteract the effects of the prednisone.  The patient has a significant smoking history, having smoked since the age of 52, with a peak of 1.5-2 packs per day. She reports a significant reduction in smoking since her heart attack, and has further reduced to about three cigarettes per day since the onset of her current respiratory symptoms.  The patient has a history of multiple  hospital admissions for respiratory issues, but reports that her oxygen saturation levels have remained stable during this current episode. She denies any chest pain and reports adherence to her angina management plan, which includes nitroglycerin and baby aspirin as needed.   No chest pain reported.        Review of Systems  Constitutional:  Positive for fatigue.  Respiratory:  Positive for cough, shortness of breath and wheezing.   Cardiovascular:  Negative for chest pain and palpitations.  Gastrointestinal:  Negative for abdominal pain, diarrhea and nausea.  Musculoskeletal:  Negative for back pain and neck pain.       Pedal edema bilaterally.   Skin:  Negative for rash.  Neurological:  Negative for facial asymmetry and numbness.  Hematological:  Negative for adenopathy. Does not bruise/bleed easily.  Psychiatric/Behavioral:  Negative for behavioral problems, dysphoric mood, sleep disturbance and suicidal ideas. The patient is not nervous/anxious.    Past Medical History:  Diagnosis Date   Abscess    Asthma    CHF (congestive heart failure) (HCC)    COPD (chronic obstructive pulmonary disease) (HCC)    Coronary artery disease    Diabetes mellitus without complication (HCC)    Emphysema of lung (HCC)    HIV (human immunodeficiency virus infection) (HCC)    Hypertension    Pneumonia      Social History   Socioeconomic History   Marital status: Married    Spouse name: Not on file   Number of children: Not on file   Years of education: Not on file   Highest education level: Not on file  Occupational History   Not  on file  Tobacco Use   Smoking status: Every Day    Current packs/day: 1.00    Average packs/day: 1 pack/day for 30.1 years (30.1 ttl pk-yrs)    Types: Cigarettes    Start date: 09/09/1993   Smokeless tobacco: Never   Tobacco comments:    Pt. Reports down to 1/2 a pack a day  Vaping Use   Vaping status: Never Used  Substance and Sexual Activity   Alcohol  use: No   Drug use: No   Sexual activity: Not on file  Other Topics Concern   Not on file  Social History Narrative   Not on file   Social Determinants of Health   Financial Resource Strain: Not on file  Food Insecurity: Patient Declined (05/02/2023)   Hunger Vital Sign    Worried About Running Out of Food in the Last Year: Patient declined    Ran Out of Food in the Last Year: Patient declined  Transportation Needs: No Transportation Needs (05/02/2023)   PRAPARE - Administrator, Civil Service (Medical): No    Lack of Transportation (Non-Medical): No  Physical Activity: Not on file  Stress: Not on file  Social Connections: Unknown (04/03/2022)   [facility]   Social Network    Social Network: Not on file  Intimate Partner Violence: Not At Risk (05/02/2023)   Humiliation, Afraid, Rape, and Kick questionnaire    Fear of Current or Ex-Partner: No    Emotionally Abused: No    Physically Abused: No    Sexually Abused: No    Past Surgical History:  Procedure Laterality Date   CESAREAN SECTION     CHOLECYSTECTOMY     FOOT SURGERY Left    LEFT HEART CATH AND CORONARY ANGIOGRAPHY N/A 05/03/2023   Procedure: LEFT HEART CATH AND CORONARY ANGIOGRAPHY;  Surgeon: [place], Peter M, MD;  Location: [facility] INVASIVE CV LAB;  Service: Cardiovascular;  Laterality: N/A;   TUBAL LIGATION      No family history on file.  Allergies  Allergen Reactions   Ozempic (0.25 Or 0.5 Mg-Dose) [Semaglutide(0.25 Or 0.5mg -Dos)] Nausea Only   Jardiance [Empagliflozin] Other (See Comments)    Yeast infections   Metformin And Related Other (See Comments)    Flu-like symptoms   Wellbutrin [Bupropion]     unknown   Trulicity [Dulaglutide] Rash    Current Outpatient Medications on File Prior to Visit  Medication Sig Dispense Refill   albuterol (VENTOLIN HFA) 108 (90 Base) MCG/ACT inhaler Inhale 1-2 puffs into the lungs every 6 (six) hours as needed for wheezing or shortness  of breath. 1 each 0   aspirin 81 MG chewable tablet Chew 1 tablet (81 mg total) by mouth daily.     atorvastatin (LIPITOR) 80 MG tablet Take 1 tablet (80 mg total) by mouth daily. 90 tablet 1   bictegravir-emtricitabine-tenofovir AF (BIKTARVY) 50-200-25 MG TABS tablet Take by mouth daily.     carvedilol (COREG) 3.125 MG tablet Take 1 tablet (3.125 mg total) by mouth 2 (two) times daily with a meal. 180 tablet 3   clonazePAM (KLONOPIN) 0.5 MG tablet Take 1 mg by mouth 2 (two) times daily as needed for anxiety.     clopidogrel (PLAVIX) 75 MG tablet Take 1 tablet (75 mg total) by mouth daily. 90 tablet 3   Continuous Glucose Sensor (FREESTYLE LIBRE 3 SENSOR) MISC Use to check blood glucose continuously. 2 each 2   insulin glargine (LANTUS) 100 UNIT/ML injection Inject  30 Units into the skin at bedtime.     insulin lispro (HUMALOG) 100 UNIT/ML KwikPen Inject 0-11 Units into the skin 3 (three) times daily with meals based on sliding scale: Blood Glucose <150= 0 unit; BG 150-200= 1 unit; BG 201-250= 3 unit; BG 251-300= 5 unit; BG 301-350= 7 unit; BG 351-400= 9 unit; BG >400= 11 unit and Call Primary Care. 30 mL 0   Insulin Pen Needle 32G X 4 MM MISC Use 3 times a day with Humalog Kwik pen 100 each 2   Insulin Syringe-Needle U-100 ([employer] INSULIN SYRINGES 31GX5/16") 31G X 5/16" 0.3 ML MISC Use to inject Lantus once a day 100 each 0   nitroGLYCERIN (NITROSTAT) 0.4 MG SL tablet Place 1 tablet (0.4 mg total) under the tongue every 5 (five) minutes as needed for chest pain. 30 tablet 3   ondansetron (ZOFRAN-ODT) 4 MG disintegrating tablet Take 1 tablet (4 mg total) by mouth every 8 (eight) hours as needed for nausea or vomiting. 10 tablet 0   ranolazine (RANEXA) 500 MG 12 hr tablet Take 1 tablet (500 mg total) by mouth 2 (two) times daily. 180 tablet 3   sacubitril-valsartan (ENTRESTO) 24-26 MG Take 1 tablet by mouth 2 (two) times daily. 60 tablet 3   sacubitril-valsartan (ENTRESTO) 24-26 MG Take 1 tablet by mouth  2 (two) times daily. 28 tablet 0   valACYclovir (VALTREX) 500 MG tablet Take 500 mg by mouth 2 (two) times daily as needed (For cold sores).     Evolocumab (REPATHA SURECLICK) 140 MG/ML SOAJ Inject 140 mg into the skin every 14 (fourteen) days. (Patient not taking: Reported on 10/21/2023) 2 mL 5   furosemide (LASIX) 20 MG tablet Take 0.5-1 tablets (10-20 mg total) by mouth every other day. (Patient not taking: Reported on 09/10/2023) 15 tablet 0   Vitamin D, Ergocalciferol, (DRISDOL) 1.25 MG (50000 UNIT) CAPS capsule Take 1 capsule (50,000 Units total) by mouth every 7 (seven) days. (Patient not taking: Reported on 10/21/2023) 8 capsule 0   No current facility-administered medications on file prior to visit.    BP (!) 140/88   Pulse 79   Ht 5\' 7"  (1.702 m)   Wt 220 lb 12.8 oz (100.2 kg)   SpO2 99%   BMI 34.58 kg/m         Objective:   Physical Exam  General Mental Status- Alert. General Appearance- Not in acute distress.   Skin General: Color- Normal Color. Moisture- Normal Moisture.  Neck Carotid Arteries- Normal color. Moisture- Normal Moisture. No carotid bruits. No JVD.  Chest and Lung Exam Auscultation: Breath Sounds:-even unlabored but rough breath sounds bilaterally.   Cardiovascular Auscultation:Rythm- rrrr Murmurs & Other Heart Sounds:Auscultation of the heart reveals- No Murmurs.  Abdomen Inspection:-Inspeection Normal. Palpation/Percussion:Note:No mass. Palpation and Percussion of the abdomen reveal- Non Tender, Non Distended + BS, no rebound or guarding.   Neurologic Cranial Nerve exam:- CN III-XII intact(No nystagmus), symmetric smile. Strength:- 5/5 equal and symmetric strength both upper and lower extremities.   Lower ext- 1 + symmetric  pedal edema, negative homans signs.    Assessment & Plan:   Assessment and Plan    Patient Instructions  Asthmatic Bronchitis Acute exacerbation with persistent cough, wheezing, and dyspnea. No improvement with  current treatment (Prednisone 20mg  daily, Zithromax, and Albuterol inhaler). No signs of infection in sputum. -Order CBC, CMP, BNP, D-dimer, chest x-ray, and troponin stat to rule out other causes of symptoms. -Consider changing antibiotic to Doxycycline and increasing Prednisone dose  depending on lab results. Will first see by labs if able to manage out pt or if needs ED evaluation. -needed to expidite pt to lab before they closed.  Diabetes Mellitus Poor glycemic control due to Prednisone use. Currently on Lantus 36 units at night and Humalog 7 base units plus sliding scale. -Increase sliding scale Humalog by 2 units above her current regimen. -Monitor blood glucose closely and go to the emergency department if blood glucose exceeds 400.  Tobacco Use Heavy smoking history with recent reduction to 3 cigarettes per day due to illness. -Encourage continued smoking cessation.  Angina No current chest pain reported. Patient has nitroglycerin and chewable baby aspirin on hand for chest pain. -Continue current management.  Follow-up date to be determined after lab review. -explained to pt since short week if not significantly improving or if urgen or emergent lab or imaging studies will need to be seen in ED  Close monitoring of symptoms and blood glucose required. If symptoms worsen or blood glucose exceeds 400, patient should go to the emergency department. Follow-up after lab results are available.     Esperanza Richters, PA-C       [phone]  Time spent with patient today was 51  minutes which consisted of chart review, discussing diagnosis, work up treatment and documentation.

## 2023-10-22 ENCOUNTER — Telehealth: Payer: Self-pay

## 2023-10-22 ENCOUNTER — Telehealth: Payer: Self-pay | Admitting: Family Medicine

## 2023-10-22 ENCOUNTER — Other Ambulatory Visit: Payer: Self-pay | Admitting: Internal Medicine

## 2023-10-22 ENCOUNTER — Encounter: Payer: Self-pay | Admitting: Medical

## 2023-10-22 ENCOUNTER — Ambulatory Visit: Payer: Medicaid Other | Admitting: Family Medicine

## 2023-10-22 MED ORDER — FREESTYLE LIBRE 3 SENSOR MISC: 2 refills | Status: DC

## 2023-10-22 MED ORDER — PREDNISONE 10 MG (21) PO TBPK: ORAL_TABLET | ORAL | 0 refills | Status: DC

## 2023-10-22 NOTE — Telephone Encounter (Signed)
Prescription Request  10/22/2023  Is this a "Controlled Substance" medicine? No  LOV: 08/19/2023  What is the name of the medication or equipment?   Continuous Glucose Sensor (FREESTYLE LIBRE 3 SENSOR)  Have you contacted your pharmacy to request a refill? Yes   Which pharmacy would you like this sent to?    [facility] - [place], [place] - [address] [place] [address] Phone: [phone] Fax: [phone]    Patient notified that their request is being sent to the clinical staff for review and that they should receive a response within 2 business days.   Please advise at Mobile [phone] (mobile)

## 2023-10-22 NOTE — Telephone Encounter (Signed)
Ramon Dredge messaged her about it this morning. Repeat was improved. Please make sure she gets his message and has follow-up scheduled for next week.

## 2023-10-22 NOTE — Telephone Encounter (Signed)
Prescription sent

## 2023-10-22 NOTE — Telephone Encounter (Signed)
Called pt twice , hung up each time phone was answered once asked " may I speak to [place]"

## 2023-10-22 NOTE — Addendum Note (Signed)
Addended bySilvio Pate on: 10/22/2023 02:49 PM   Modules accepted: Orders

## 2023-10-22 NOTE — Telephone Encounter (Signed)
Initial Comment Caller states she has critical lab values to report. Translation No Nurse Assessment Nurse: Jayme Cloud, RN, Elmyra Ricks Date/Time Lamount Cohen Time): 10/21/2023 7:26:57 PM Is there an on-call provider listed? ---Yes Please list name of person reporting value (Lab Employee) and a contact number. ---[phone] Elisabeth Pigeon Please document the following items: Lab name Lab value (read back to lab to verify) Reference range for lab value Date and time blood was drawn Collect time of birth for bilirubin results ---potassium serum elevated by repeat analysis: 6.2 normal ref. range : 3.5-5.3 collected today 10/21/2023 at 1125 Please collect the patient contact information from the lab. (name, phone number and address) ---Vallery Sa [phone] Disp. Time Lamount Cohen Time) Disposition Final User 10/21/2023 7:40:04 PM Clinical Call Yes Jayme Cloud RN, Elmyra Ricks Final Disposition 10/21/2023 7:40:04 PM Clinical Call Yes Jayme Cloud, RN, Elmyra Ricks Comments User: Christean Grief, RN Date/Time Lamount Cohen Time): 10/21/2023 7:39:25 PM Per directive: to call patient and advise to stop ace inhibitor or potassium supplement. Patient is not on either. She will anticipate call back from office regarding labs. She was seen in office for breathing issues, finds lab result unrelated, and will continue to monitor for breathing distress at home. PLEASE NOTE: All timestamps contained within this report are represented as [place] Standard Time. CONFIDENTIALTY NOTICE: This fax transmission is intended only for the addressee. It contains information that is legally privileged, confidential or otherwise protected from use or disclosure. If you are not the intended recipient, you are strictly prohibited from reviewing, disclosing, copying using or disseminating any of this information or taking any action in reliance on or regarding this information. If you have received this fax in error, please notify us immediately by  telephone so that we can arrange for its return to [place]. Phone: [phone], Toll-Free: [phone], Fax: [phone] Page: 2 of 2 Call Id: [id number] Referrals REFERRED TO PCP OFFICE

## 2023-10-27 DIAGNOSIS — Z419 Encounter for procedure for purposes other than remedying health state, unspecified: Secondary | ICD-10-CM | POA: Diagnosis not present

## 2023-11-05 ENCOUNTER — Encounter: Payer: Self-pay | Admitting: Cardiovascular Disease

## 2023-11-05 NOTE — Telephone Encounter (Signed)
Error

## 2023-11-07 DIAGNOSIS — Z3200 Encounter for pregnancy test, result unknown: Secondary | ICD-10-CM | POA: Diagnosis not present

## 2023-11-07 DIAGNOSIS — F330 Major depressive disorder, recurrent, mild: Secondary | ICD-10-CM | POA: Diagnosis not present

## 2023-11-07 DIAGNOSIS — F411 Generalized anxiety disorder: Secondary | ICD-10-CM | POA: Diagnosis not present

## 2023-11-07 DIAGNOSIS — F410 Panic disorder [episodic paroxysmal anxiety] without agoraphobia: Secondary | ICD-10-CM | POA: Diagnosis not present

## 2023-11-07 DIAGNOSIS — E6609 Other obesity due to excess calories: Secondary | ICD-10-CM | POA: Diagnosis not present

## 2023-11-07 DIAGNOSIS — G4700 Insomnia, unspecified: Secondary | ICD-10-CM | POA: Diagnosis not present

## 2023-11-07 DIAGNOSIS — Z6835 Body mass index (BMI) 35.0-35.9, adult: Secondary | ICD-10-CM | POA: Diagnosis not present

## 2023-11-07 DIAGNOSIS — Z79899 Other long term (current) drug therapy: Secondary | ICD-10-CM | POA: Diagnosis not present

## 2023-11-07 DIAGNOSIS — F4310 Post-traumatic stress disorder, unspecified: Secondary | ICD-10-CM | POA: Diagnosis not present

## 2023-11-08 DIAGNOSIS — B079 Viral wart, unspecified: Secondary | ICD-10-CM | POA: Diagnosis not present

## 2023-11-08 DIAGNOSIS — L732 Hidradenitis suppurativa: Secondary | ICD-10-CM | POA: Diagnosis not present

## 2023-11-11 DIAGNOSIS — N1832 Chronic kidney disease, stage 3b: Secondary | ICD-10-CM | POA: Diagnosis not present

## 2023-11-11 DIAGNOSIS — E1122 Type 2 diabetes mellitus with diabetic chronic kidney disease: Secondary | ICD-10-CM | POA: Diagnosis not present

## 2023-11-11 DIAGNOSIS — M898X9 Other specified disorders of bone, unspecified site: Secondary | ICD-10-CM | POA: Diagnosis not present

## 2023-11-11 DIAGNOSIS — I129 Hypertensive chronic kidney disease with stage 1 through stage 4 chronic kidney disease, or unspecified chronic kidney disease: Secondary | ICD-10-CM | POA: Diagnosis not present

## 2023-11-11 DIAGNOSIS — E559 Vitamin D deficiency, unspecified: Secondary | ICD-10-CM | POA: Diagnosis not present

## 2023-11-11 DIAGNOSIS — R809 Proteinuria, unspecified: Secondary | ICD-10-CM | POA: Diagnosis not present

## 2023-11-11 DIAGNOSIS — F17201 Nicotine dependence, unspecified, in remission: Secondary | ICD-10-CM | POA: Diagnosis not present

## 2023-11-11 DIAGNOSIS — N181 Chronic kidney disease, stage 1: Secondary | ICD-10-CM | POA: Diagnosis not present

## 2023-11-11 DIAGNOSIS — Z794 Long term (current) use of insulin: Secondary | ICD-10-CM | POA: Diagnosis not present

## 2023-11-11 DIAGNOSIS — E8779 Other fluid overload: Secondary | ICD-10-CM | POA: Diagnosis not present

## 2023-11-12 DIAGNOSIS — Z79899 Other long term (current) drug therapy: Secondary | ICD-10-CM | POA: Diagnosis not present

## 2023-11-14 ENCOUNTER — Other Ambulatory Visit: Payer: Self-pay | Admitting: Internal Medicine

## 2023-11-18 ENCOUNTER — Ambulatory Visit: Payer: Medicaid Other | Admitting: Family Medicine

## 2023-11-19 ENCOUNTER — Ambulatory Visit: Payer: Medicaid Other | Admitting: Family Medicine

## 2023-11-19 ENCOUNTER — Ambulatory Visit (INDEPENDENT_AMBULATORY_CARE_PROVIDER_SITE_OTHER): Payer: Medicaid Other | Admitting: Family Medicine

## 2023-11-19 ENCOUNTER — Encounter: Payer: Self-pay | Admitting: Family Medicine

## 2023-11-19 VITALS — BP 143/70 | HR 73 | Ht 67.0 in | Wt 231.0 lb

## 2023-11-19 DIAGNOSIS — E782 Mixed hyperlipidemia: Secondary | ICD-10-CM | POA: Diagnosis not present

## 2023-11-19 DIAGNOSIS — Z21 Asymptomatic human immunodeficiency virus [HIV] infection status: Secondary | ICD-10-CM

## 2023-11-19 DIAGNOSIS — I10 Essential (primary) hypertension: Secondary | ICD-10-CM | POA: Diagnosis not present

## 2023-11-19 DIAGNOSIS — N1831 Chronic kidney disease, stage 3a: Secondary | ICD-10-CM

## 2023-11-19 DIAGNOSIS — K219 Gastro-esophageal reflux disease without esophagitis: Secondary | ICD-10-CM

## 2023-11-19 DIAGNOSIS — E1165 Type 2 diabetes mellitus with hyperglycemia: Secondary | ICD-10-CM

## 2023-11-19 DIAGNOSIS — R5383 Other fatigue: Secondary | ICD-10-CM

## 2023-11-19 DIAGNOSIS — E559 Vitamin D deficiency, unspecified: Secondary | ICD-10-CM

## 2023-11-19 DIAGNOSIS — Z794 Long term (current) use of insulin: Secondary | ICD-10-CM | POA: Diagnosis not present

## 2023-11-19 DIAGNOSIS — R600 Localized edema: Secondary | ICD-10-CM

## 2023-11-19 LAB — IBC + FERRITIN
Ferritin: 16.5 ng/mL (ref 10.0–291.0)
Iron: 113 ug/dL (ref 42–145)
Saturation Ratios: 39.2 % (ref 20.0–50.0)
TIBC: 288.4 ug/dL (ref 250.0–450.0)
Transferrin: 206 mg/dL — ABNORMAL LOW (ref 212.0–360.0)

## 2023-11-19 LAB — B12 AND FOLATE PANEL
Folate: 14.4 ng/mL (ref >5.9)
Vitamin B-12: 342 pg/mL (ref 211–911)

## 2023-11-19 MED ORDER — FUROSEMIDE 20 MG PO TABS
20.0000 mg | ORAL_TABLET | Freq: Every day | ORAL | 3 refills | Status: AC | PRN
Start: 2023-11-19 — End: ?

## 2023-11-19 MED ORDER — LANSOPRAZOLE 15 MG PO TBDD: 15.0000 mg | DELAYED_RELEASE_TABLET | Freq: Every day | ORAL | 3 refills | Status: DC

## 2023-11-19 MED ORDER — VITAMIN D (ERGOCALCIFEROL) 1.25 MG (50000 UNIT) PO CAPS: 50000.0000 [IU] | ORAL_CAPSULE | ORAL | 0 refills | Status: DC

## 2023-11-19 NOTE — Progress Notes (Signed)
Established Patient Office Visit  Subjective    Patient ID: Renee Bryant, female    DOB: 10-21-1981  Age: 42 y.o. MRN: [id number]  CC:  Chief Complaint  Patient presents with   Medical Management of Chronic Issues   Diabetes    HPI Renee Bryant presents for routine follow-up and chronic disease management.   She has been doing well overall, but feeling tired.    Diabetes: - Checking glucose at home: yes, fasting <200 consistently  - Medications: Lantus 36 units nightly; Humalog sliding scale with meals + 7 units meal coverage - Compliance: good - Diet: heart healthy, diabetic - Exercise: minimal - Eye exam: unknown - Foot exam: unknown - Microalbumin: 07/08/23 high, following with nephrology  - Denies symptoms of hypoglycemia, polyuria, polydipsia, numbness extremities, foot ulcers/trauma, wounds that are not healing, medication side effects  - She is following with [facility]. She was at 8.5% in October. Lab Results  Component Value Date   HGBA1C 8.7 (H) 08/19/2023     HTN, HLD, CAD (NSTEMI 05/02/23 -proximal to mid LAD 40%, D1 90% not amenable to PCI secondary to concern for plaque shift/impingement of the LAD. Aggressive medical therapy and risk factor modification was recommended; EF 45-50%) : - Medications: aspirin 81 mg daily, Plavix 75 mg daily, Lasix 10-20 mg every other day (she had not been taking, but nephrology recommended she restart due to edema), carvedilol 3.125 mg BID, ranexa 500 mg BID, Entresto 24-26 mg BID, Repatha 140 mg every 2 weeks, atorvastatin 80 mg daily - Compliance: good - Checking BP at home: rarely - Denies any SOB, recurrent headaches, CP, vision changes, LE edema, dizziness, palpitations, or medication side effects. - Diet: trying for heart healthy diabetic diet - Exercise: minimal - Following with [facility]- seeing them again in January   Wt Readings from Last 3 Encounters:  11/19/23 231 lb (104.8 kg)   10/21/23 220 lb (99.8 kg)  10/21/23 220 lb 12.8 oz (100.2 kg)    HIV: - [facility] - Biktarvy   CKD 3: - [facility] - last visit 11/11/23, they recommended she restart daily/PRN lasix for fluid retention    Asthma/Smoking: - [facility] - States they ruled out COPD at recent visit. She quit smoking about a month ago!   Vitamin D deficiency: - Nephrologist just rechecked and still low at 8.3 - Currently taking 2,000 units daily   GERD: - pantoprazole did not make her feel well and omeprazole stopped working. She found some old prevacid which helped and would like a refill.      Wt Readings from Last 3 Encounters:  11/19/23 231 lb (104.8 kg)  10/21/23 220 lb (99.8 kg)  10/21/23 220 lb 12.8 oz (100.2 kg)      ROS All review of systems negative except what is listed in the HPI      Objective    BP (!) 143/70   Pulse 73   Ht 5\' 7"  (1.702 m)   Wt 231 lb (104.8 kg)   LMP 10/14/2023 (Approximate)   SpO2 100%   BMI 36.18 kg/m   Physical Exam Vitals reviewed.  Constitutional:      Appearance: Normal appearance.  Cardiovascular:     Rate and Rhythm: Normal rate and regular rhythm.     Heart sounds: Normal heart sounds.  Pulmonary:     Effort: Pulmonary effort is normal.     Breath sounds: Normal breath sounds.  Musculoskeletal:  Right lower leg: Edema present.     Left lower leg: Edema present.  Skin:    General: Skin is warm and dry.  Neurological:     Mental Status: She is alert and oriented to person, place, and time.  Psychiatric:        Mood and Affect: Mood normal.        Behavior: Behavior normal.        Thought Content: Thought content normal.        Judgment: Judgment normal.         Assessment & Plan:   Problem List Items Addressed This Visit       Active Problems   HIV (human immunodeficiency virus infection) (HCC) (Chronic)   Well-managed with undetectable levels for the past 15 years. Currently  on Biktarvy. -Continue current management with infectious disease specialist.      Essential hypertension (Chronic)   Blood pressure is now at goal for age and co-morbidities.   Recommendations: continue current meds. Restart PRN lasix -okay per nephrology. - BP goal <130/80 - monitor and log blood pressures at home - check around the same time each day in a relaxed setting - Limit salt to <2000 mg/day - Follow DASH eating plan (heart healthy diet) - limit alcohol to 2 standard drinks per day for men and 1 per day for women - avoid tobacco products - get at least 2 hours of regular aerobic exercise weekly Patient aware of signs/symptoms requiring further/urgent evaluation. Labs stable at recent nephrology follow-up Keep January follow-up with cardiology        Relevant Medications   furosemide (LASIX) 20 MG tablet   Uncontrolled type 2 diabetes mellitus with hyperglycemia, with long-term current use of insulin (HCC) (Chronic)   A1c improving but still elevated. Patient reports fatigue and weight gain, possibly related to insulin therapy. -Continue current insulin regimen (Lantus 36 units nightly, Humalog sliding scale plus 7 units). -Endocrinology follow-up in January to reassess A1c and possibly adjust therapy.      CKD stage 3a, GFR 45-59 ml/min (HCC) (Chronic)   Following with [facility] - they recommended she restart PRN lasix Continue regular follow-up      Mixed hyperlipidemia (Chronic)   Following with cardiology Lifestyle factors for lowering cholesterol include: Diet therapy - heart-healthy diet rich in fruits, veggies, fiber-rich whole grains, lean meats, chicken, fish (at least twice a week), fat-free or 1% dairy products; foods low in saturated/trans fats, cholesterol, sodium, and sugar. Mediterranean diet has shown to be very heart healthy. Regular exercise - recommend at least 30 minutes a day, 5 times per week Weight management        Relevant  Medications   furosemide (LASIX) 20 MG tablet   Vitamin D deficiency (Chronic)   Persistent despite daily supplementation. -Resume high-dose Vitamin D therapy (50,000 units weekly) for 12 weeks, continue daily 2,000 units.      Relevant Medications   Vitamin D, Ergocalciferol, (DRISDOL) 1.25 MG (50000 UNIT) CAPS capsule   Esophageal reflux   Poor tolerance to Protonix -Switch to Prevacid 15mg  daily.      Relevant Medications   lansoprazole (PREVACID SOLUTAB) 15 MG disintegrating tablet   Other Visit Diagnoses       Fatigue, unspecified type    -  Primary   Relevant Orders   B12 and Folate Panel   IBC + Ferritin     Lower extremity edema       Relevant Medications   furosemide (LASIX) 20  MG tablet           Return in about 3 months (around 02/17/2024) for routine follow-up.   Clayborne Dana, NP

## 2023-11-19 NOTE — Assessment & Plan Note (Signed)
Persistent despite daily supplementation. -Resume high-dose Vitamin D therapy (50,000 units weekly) for 12 weeks, continue daily 2,000 units.

## 2023-11-19 NOTE — Assessment & Plan Note (Signed)
Well-managed with undetectable levels for the past 15 years. Currently on Biktarvy. -Continue current management with infectious disease specialist.

## 2023-11-19 NOTE — Assessment & Plan Note (Signed)
Blood pressure is now at goal for age and co-morbidities.   Recommendations: continue current meds. Restart PRN lasix -okay per nephrology. - BP goal <130/80 - monitor and log blood pressures at home - check around the same time each day in a relaxed setting - Limit salt to <2000 mg/day - Follow DASH eating plan (heart healthy diet) - limit alcohol to 2 standard drinks per day for men and 1 per day for women - avoid tobacco products - get at least 2 hours of regular aerobic exercise weekly Patient aware of signs/symptoms requiring further/urgent evaluation. Labs stable at recent nephrology follow-up Keep January follow-up with cardiology

## 2023-11-19 NOTE — Assessment & Plan Note (Signed)
Following with [facility] - they recommended she restart PRN lasix Continue regular follow-up

## 2023-11-19 NOTE — Assessment & Plan Note (Signed)
Poor tolerance to Protonix -Switch to Prevacid 15mg  daily.

## 2023-11-19 NOTE — Assessment & Plan Note (Signed)
A1c improving but still elevated. Patient reports fatigue and weight gain, possibly related to insulin therapy. -Continue current insulin regimen (Lantus 36 units nightly, Humalog sliding scale plus 7 units). -Endocrinology follow-up in January to reassess A1c and possibly adjust therapy.

## 2023-11-19 NOTE — Assessment & Plan Note (Signed)
Following with cardiology Lifestyle factors for lowering cholesterol include: Diet therapy - heart-healthy diet rich in fruits, veggies, fiber-rich whole grains, lean meats, chicken, fish (at least twice a week), fat-free or 1% dairy products; foods low in saturated/trans fats, cholesterol, sodium, and sugar. Mediterranean diet has shown to be very heart healthy. Regular exercise - recommend at least 30 minutes a day, 5 times per week Weight management

## 2023-11-25 ENCOUNTER — Telehealth: Payer: Self-pay

## 2023-11-25 NOTE — Telephone Encounter (Signed)
PA initiated via Covermymeds; KEY:  [id number]. Awaiting determination.

## 2023-11-25 NOTE — Telephone Encounter (Signed)
PA approved.   Approved. This drug has been approved. Approved quantity: 90 tablets per 90 day(s). You may fill up to a 34 day supply at a retail pharmacy. You may fill up to a 90 day supply for maintenance drugs, please refer to the formulary for details. Please call the pharmacy to process your prescription claim. Authorization Expiration Date: 11/24/2024

## 2023-11-27 DIAGNOSIS — Z419 Encounter for procedure for purposes other than remedying health state, unspecified: Secondary | ICD-10-CM | POA: Diagnosis not present

## 2023-12-04 DIAGNOSIS — Z87891 Personal history of nicotine dependence: Secondary | ICD-10-CM | POA: Diagnosis not present

## 2023-12-04 DIAGNOSIS — Z978 Presence of other specified devices: Secondary | ICD-10-CM | POA: Diagnosis not present

## 2023-12-04 DIAGNOSIS — E1169 Type 2 diabetes mellitus with other specified complication: Secondary | ICD-10-CM | POA: Diagnosis not present

## 2023-12-04 DIAGNOSIS — E785 Hyperlipidemia, unspecified: Secondary | ICD-10-CM | POA: Diagnosis not present

## 2023-12-04 DIAGNOSIS — E1165 Type 2 diabetes mellitus with hyperglycemia: Secondary | ICD-10-CM | POA: Diagnosis not present

## 2023-12-04 DIAGNOSIS — Z794 Long term (current) use of insulin: Secondary | ICD-10-CM | POA: Diagnosis not present

## 2023-12-04 DIAGNOSIS — I10 Essential (primary) hypertension: Secondary | ICD-10-CM | POA: Diagnosis not present

## 2023-12-07 NOTE — Progress Notes (Deleted)
 Cardiology Office Note    Date:  12/07/2023  ID:  Renee Bryant, DOB 09-Apr-1981, MRN [id number] PCP:  Almarie Waddell NOVAK, NP  Cardiologist:  Darryle ONEIDA Decent, MD  Electrophysiologist:  None   Chief Complaint: ***  History of Present Illness: .    Renee Bryant is a 43 y.o. female with visit-pertinent history of CAD, HFmEF, HLD, DM, HIV, tobacco abuse and CKD stage III. She is followed by Dr. Decent, she presents today for follow up.   First evaluated by Dr. Decent on 05/02/2023 for NSTEMI.  She had presented to the ED with a 1 day history of chest discomfort described as burning radiation to her left arm with associated nausea and vomiting as well as shortness of breath. Troponin 113>>146.  She underwent LHC that showed proximal to mid LAD 40%, D1 90% not amenable to PCI secondary to concern for plaque shift/impingement of the LAD.  Aggressive medical therapy and risk factor modification was recommended.  She was seen in follow-up on 05/13/2023 for post cath follow-up.  She remained stable from a cardiac standpoint although was requesting Lasix  for lower extremity edema.  Her labs at that time showed a declining kidney function with creatinine 2.07.  Her losartan  was stopped, follow-up BMP showed improved creatinine to 1.45.  She had 2 ED visits in July for abdominal pain, first visit felt to be possible early flareup of diverticulitis, she was started empirically on oral antibiotics and her second visit with somewhat similar symptoms but patient was concerned her pain was an anginal equivalent, troponin was negative.  CT abdomen pelvis showed right ovarian cyst 2.2 cm.  Patient was last seen in clinic on 09/10/2023 by Dr. Decent.  She had overall remained stable from a cardiac standpoint with a 1 isolated episode of chest pain not requiring nitroglycerin .  She reported that her fluid status had improved since starting on carvedilol  and Entresto .  Her Lasix  was discontinued due to lack of volume  overload.  On 10/21/2023 patient presented to the ED with increased shortness of breath and a potassium level of 6.2.  Patient reported that she been dealing with bronchitis and was on prednisone  and a Z-Pak.  She continued to note occasional coughing attacks.  It was noted that she had had some chest discomfort but this was related to coughing, she had a negative troponin in the outpatient setting and had a repeat troponin at visit in the ED that was normal.  Today she presents for hospital follow-up.  She reports that she    Labwork independently reviewed: 11/11/2023: Sodium 133, potassium 4.5, creatinine 1.71 10/21/2023: Hemoglobin 11.9, hematocrit 35 ROS: .   *** denies chest pain, shortness of breath, lower extremity edema, fatigue, palpitations, melena, hematuria, hemoptysis, diaphoresis, weakness, presyncope, syncope, orthopnea, and PND.  All other systems are reviewed and otherwise negative.  Studies Reviewed: SABRA    EKG:  EKG is ordered today, personally reviewed, demonstrating *** .hcstudy CV Studies: Cardiac studies reviewed are outlined and summarized above. Otherwise please see EMR for full report. Cardiac Studies & Procedures   CARDIAC CATHETERIZATION  CARDIAC CATHETERIZATION 05/03/2023  Narrative   Prox LAD to Mid LAD lesion is 40% stenosed.   1st Diag lesion is 90% stenosed.   LV end diastolic pressure is normal.  Single vessel obstructive CAD involving the first diagonal. Normal LVEDP  Plan: would recommend aggressive medical therapy and risk factor modification. Given ostial location of diagonal stenosis PCI is less than ideal since this could lead  to plaque shift/impingement of the LAD. There is moderate nonobstructive disease in the mid LAD. Will continue metoprolol . Given soft BP will add Ranexa . Would add Plavix  for ACS indication but this will need to be weighed against risk of bleeding with history of uterine bleeding.  Findings Coronary Findings Diagnostic   Dominance: Co-dominant  Left Main Vessel was injected. Vessel is normal in caliber. Vessel is angiographically normal.  Left Anterior Descending Prox LAD to Mid LAD lesion is 40% stenosed.  First Diagonal Branch 1st Diag lesion is 90% stenosed.  Second Diagonal Branch Vessel is small in size.  Third Diagonal Branch Vessel is small in size.  Left Circumflex Vessel was injected. Vessel is normal in caliber. Vessel is angiographically normal.  Right Coronary Artery Vessel was injected. Vessel is small. There is mild diffuse disease throughout the vessel.  Intervention  No interventions have been documented.    ECHOCARDIOGRAM  ECHOCARDIOGRAM COMPLETE 05/02/2023  Narrative ECHOCARDIOGRAM REPORT    Patient Name:   Renee Bryant Date of Exam: 05/02/2023 Medical Rec #:  [id number]    Height:       67.0 in Accession #:    [id number]   Weight:       199.1 lb Date of Birth:  Apr 03, 1981    BSA:          2.018 m Patient Age:    41 years     BP:           118/74 mmHg Patient Gender: F            HR:           72 bpm. Exam Location:  Inpatient  Procedure: 2D Echo, Cardiac Doppler, Color Doppler and Intracardiac Opacification Agent  Indications:    Chest Pain  History:        Patient has no prior history of Echocardiogram examinations. Risk Factors:Hypertension and Diabetes.  Sonographer:    Jayson Gaskins Referring Phys: [id number] ALLISON L ELLIS  IMPRESSIONS   1. Distal septal apical hypokinesis . Left ventricular ejection fraction, by estimation, is 45 to 50%. The left ventricle has mildly decreased function. The left ventricle demonstrates regional wall motion abnormalities (see scoring diagram/findings for description). The left ventricular internal cavity size was mildly dilated. Left ventricular diastolic parameters were normal. 2. Right ventricular systolic function is normal. The right ventricular size is normal. 3. The mitral valve is abnormal. Trivial mitral valve  regurgitation. No evidence of mitral stenosis. 4. The aortic valve was not well visualized. There is mild calcification of the aortic valve. Aortic valve regurgitation is not visualized. Aortic valve sclerosis is present, with no evidence of aortic valve stenosis. 5. The inferior vena cava is normal in size with greater than 50% respiratory variability, suggesting right atrial pressure of 3 mmHg.  FINDINGS Left Ventricle: Distal septal apical hypokinesis. Left ventricular ejection fraction, by estimation, is 45 to 50%. The left ventricle has mildly decreased function. The left ventricle demonstrates regional wall motion abnormalities. Definity  contrast agent was given IV to delineate the left ventricular endocardial borders. The left ventricular internal cavity size was mildly dilated. There is no left ventricular hypertrophy. Left ventricular diastolic parameters were normal.  Right Ventricle: The right ventricular size is normal. No increase in right ventricular wall thickness. Right ventricular systolic function is normal.  Left [facility]: Left atrial size was normal in size.  Right [facility]: Right atrial size was normal in size.  Pericardium: There is no evidence of pericardial effusion.  Mitral Valve:  The mitral valve is abnormal. There is mild thickening of the mitral valve leaflet(s). Trivial mitral valve regurgitation. No evidence of mitral valve stenosis.  Tricuspid Valve: The tricuspid valve is normal in structure. Tricuspid valve regurgitation is not demonstrated. No evidence of tricuspid stenosis.  Aortic Valve: The aortic valve was not well visualized. There is mild calcification of the aortic valve. Aortic valve regurgitation is not visualized. Aortic valve sclerosis is present, with no evidence of aortic valve stenosis. Aortic valve mean gradient measures 3.0 mmHg. Aortic valve peak gradient measures 4.6 mmHg. Aortic valve area, by VTI measures 2.78 cm.  Pulmonic Valve: The pulmonic  valve was normal in structure. Pulmonic valve regurgitation is not visualized. No evidence of pulmonic stenosis.  Aorta: The aortic root is normal in size and structure.  Venous: The inferior vena cava is normal in size with greater than 50% respiratory variability, suggesting right atrial pressure of 3 mmHg.  IAS/Shunts: No atrial level shunt detected by color flow Doppler.   LEFT VENTRICLE PLAX 2D LVIDd:         4.80 cm   Diastology LVIDs:         3.40 cm   LV e' medial:    7.94 cm/s LV PW:         0.70 cm   LV E/e' medial:  7.0 LV IVS:        0.90 cm   LV e' lateral:   9.57 cm/s LVOT diam:     2.00 cm   LV E/e' lateral: 5.8 LV SV:         58 LV SV Index:   29 LVOT Area:     3.14 cm   RIGHT VENTRICLE         IVC TAPSE (M-mode): 1.8 cm  IVC diam: 1.80 cm  LEFT ATRIUM             Index        RIGHT ATRIUM          Index LA Vol (A2C):   23.8 ml 11.79 ml/m  RA Area:     7.08 cm LA Vol (A4C):   28.3 ml 14.02 ml/m  RA Volume:   11.50 ml 5.70 ml/m LA Biplane Vol: 28.1 ml 13.92 ml/m AORTIC VALVE AV Area (Vmax):    2.55 cm AV Area (Vmean):   2.54 cm AV Area (VTI):     2.78 cm AV Vmax:           107.00 cm/s AV Vmean:          80.200 cm/s AV VTI:            0.208 m AV Peak Grad:      4.6 mmHg AV Mean Grad:      3.0 mmHg LVOT Vmax:         87.00 cm/s LVOT Vmean:        64.900 cm/s LVOT VTI:          0.184 m LVOT/AV VTI ratio: 0.88  AORTA Ao Root diam: 3.10 cm  MITRAL VALVE MV Area (PHT): 3.50 cm    SHUNTS MV Decel Time: 217 msec    Systemic VTI:  0.18 m MV E velocity: 55.50 cm/s  Systemic Diam: 2.00 cm MV A velocity: 61.80 cm/s MV E/A ratio:  0.90  Maude Emmer MD Electronically signed by Maude Emmer MD Signature Date/Time: 05/02/2023/12:05:04 PM    Final  Current Reported Medications:.    No outpatient medications have been marked as taking for the 12/09/23 encounter (Appointment) with Hazel Leveille D, NP.    Physical Exam:    VS:  There  were no vitals taken for this visit.   Wt Readings from Last 3 Encounters:  11/19/23 231 lb (104.8 kg)  10/21/23 220 lb (99.8 kg)  10/21/23 220 lb 12.8 oz (100.2 kg)    GEN: Well nourished, well developed in no acute distress NECK: No JVD; No carotid bruits CARDIAC: ***RRR, no murmurs, rubs, gallops RESPIRATORY:  Clear to auscultation without rales, wheezing or rhonchi  ABDOMEN: Soft, non-tender, non-distended EXTREMITIES:  No edema; No acute deformity   Asessement and Plan:.     ***     Disposition: F/u with ***  Signed, Jhoselyn Ruffini D Demosthenes Virnig, NP

## 2023-12-08 DIAGNOSIS — Z21 Asymptomatic human immunodeficiency virus [HIV] infection status: Secondary | ICD-10-CM | POA: Diagnosis not present

## 2023-12-08 DIAGNOSIS — F411 Generalized anxiety disorder: Secondary | ICD-10-CM | POA: Diagnosis not present

## 2023-12-08 DIAGNOSIS — F4310 Post-traumatic stress disorder, unspecified: Secondary | ICD-10-CM | POA: Diagnosis not present

## 2023-12-08 DIAGNOSIS — F410 Panic disorder [episodic paroxysmal anxiety] without agoraphobia: Secondary | ICD-10-CM | POA: Diagnosis not present

## 2023-12-08 DIAGNOSIS — Z3200 Encounter for pregnancy test, result unknown: Secondary | ICD-10-CM | POA: Diagnosis not present

## 2023-12-08 DIAGNOSIS — Z79899 Other long term (current) drug therapy: Secondary | ICD-10-CM | POA: Diagnosis not present

## 2023-12-08 DIAGNOSIS — G4700 Insomnia, unspecified: Secondary | ICD-10-CM | POA: Diagnosis not present

## 2023-12-08 DIAGNOSIS — F330 Major depressive disorder, recurrent, mild: Secondary | ICD-10-CM | POA: Diagnosis not present

## 2023-12-09 ENCOUNTER — Ambulatory Visit: Payer: Medicaid Other | Attending: Cardiology | Admitting: Cardiology

## 2023-12-09 DIAGNOSIS — I2510 Atherosclerotic heart disease of native coronary artery without angina pectoris: Secondary | ICD-10-CM

## 2023-12-09 DIAGNOSIS — E785 Hyperlipidemia, unspecified: Secondary | ICD-10-CM

## 2023-12-09 DIAGNOSIS — I10 Essential (primary) hypertension: Secondary | ICD-10-CM

## 2023-12-09 DIAGNOSIS — Z720 Tobacco use: Secondary | ICD-10-CM

## 2023-12-09 DIAGNOSIS — N1831 Chronic kidney disease, stage 3a: Secondary | ICD-10-CM

## 2023-12-09 DIAGNOSIS — I5022 Chronic systolic (congestive) heart failure: Secondary | ICD-10-CM

## 2023-12-13 DIAGNOSIS — Z79899 Other long term (current) drug therapy: Secondary | ICD-10-CM | POA: Diagnosis not present

## 2023-12-28 DIAGNOSIS — Z419 Encounter for procedure for purposes other than remedying health state, unspecified: Secondary | ICD-10-CM | POA: Diagnosis not present

## 2024-01-07 ENCOUNTER — Other Ambulatory Visit: Payer: Self-pay | Admitting: Student

## 2024-01-07 DIAGNOSIS — Z79899 Other long term (current) drug therapy: Secondary | ICD-10-CM | POA: Diagnosis not present

## 2024-01-07 DIAGNOSIS — E7800 Pure hypercholesterolemia, unspecified: Secondary | ICD-10-CM | POA: Diagnosis not present

## 2024-01-07 DIAGNOSIS — I10 Essential (primary) hypertension: Secondary | ICD-10-CM | POA: Diagnosis not present

## 2024-01-07 DIAGNOSIS — E1165 Type 2 diabetes mellitus with hyperglycemia: Secondary | ICD-10-CM | POA: Diagnosis not present

## 2024-01-07 DIAGNOSIS — I2510 Atherosclerotic heart disease of native coronary artery without angina pectoris: Secondary | ICD-10-CM | POA: Diagnosis not present

## 2024-01-09 DIAGNOSIS — Z7984 Long term (current) use of oral hypoglycemic drugs: Secondary | ICD-10-CM | POA: Diagnosis not present

## 2024-01-09 DIAGNOSIS — H35353 Cystoid macular degeneration, bilateral: Secondary | ICD-10-CM | POA: Diagnosis not present

## 2024-01-09 DIAGNOSIS — Z79899 Other long term (current) drug therapy: Secondary | ICD-10-CM | POA: Diagnosis not present

## 2024-01-09 DIAGNOSIS — H35043 Retinal micro-aneurysms, unspecified, bilateral: Secondary | ICD-10-CM | POA: Diagnosis not present

## 2024-01-09 DIAGNOSIS — E113513 Type 2 diabetes mellitus with proliferative diabetic retinopathy with macular edema, bilateral: Secondary | ICD-10-CM | POA: Diagnosis not present

## 2024-01-14 ENCOUNTER — Telehealth: Payer: Self-pay

## 2024-01-14 NOTE — Telephone Encounter (Signed)
PA approved.   Approved. This drug has been approved. Approved quantity: 2 sensors per 28 day(s). You may fill up to a 34 day supply at a retail pharmacy. You may fill up to a 90 day supply for maintenance drugs, please refer to the formulary for details. Please call the pharmacy to process your prescription claim. Authorization Expiration Date: 01/13/2025

## 2024-01-14 NOTE — Telephone Encounter (Signed)
PA initiated via Covermymeds; KEY: [id number]. Awaiting determination.

## 2024-01-25 DIAGNOSIS — Z419 Encounter for procedure for purposes other than remedying health state, unspecified: Secondary | ICD-10-CM | POA: Diagnosis not present

## 2024-01-27 ENCOUNTER — Telehealth: Payer: Self-pay | Admitting: Family Medicine

## 2024-01-27 MED ORDER — ATORVASTATIN CALCIUM 80 MG PO TABS: 80.0000 mg | ORAL_TABLET | Freq: Every day | ORAL | 1 refills | Status: DC

## 2024-01-27 NOTE — Telephone Encounter (Signed)
 Rx sent

## 2024-01-27 NOTE — Telephone Encounter (Signed)
 Copied from CRM [id number]. Topic: Clinical - Prescription Issue >> Jan 27, 2024 10:45 AM Theodis Sato wrote: Reason for CRM: Patient is requesting that her atorvastatin (LIPITOR) 80 MG tablet be sent to [facility] - [place], [place] - [address] [place] [address] Phone: [phone] Fax: [phone]

## 2024-01-27 NOTE — Addendum Note (Signed)
 Addended by: Conrad Amargosa D on: 01/27/2024 12:26 PM   Modules accepted: Orders

## 2024-02-02 DIAGNOSIS — E1165 Type 2 diabetes mellitus with hyperglycemia: Secondary | ICD-10-CM | POA: Diagnosis not present

## 2024-02-02 DIAGNOSIS — F4310 Post-traumatic stress disorder, unspecified: Secondary | ICD-10-CM | POA: Diagnosis not present

## 2024-02-02 DIAGNOSIS — F410 Panic disorder [episodic paroxysmal anxiety] without agoraphobia: Secondary | ICD-10-CM | POA: Diagnosis not present

## 2024-02-02 DIAGNOSIS — F411 Generalized anxiety disorder: Secondary | ICD-10-CM | POA: Diagnosis not present

## 2024-02-02 DIAGNOSIS — E6609 Other obesity due to excess calories: Secondary | ICD-10-CM | POA: Diagnosis not present

## 2024-02-02 DIAGNOSIS — Z79899 Other long term (current) drug therapy: Secondary | ICD-10-CM | POA: Diagnosis not present

## 2024-02-02 DIAGNOSIS — M722 Plantar fascial fibromatosis: Secondary | ICD-10-CM | POA: Diagnosis not present

## 2024-02-02 DIAGNOSIS — G4700 Insomnia, unspecified: Secondary | ICD-10-CM | POA: Diagnosis not present

## 2024-02-02 DIAGNOSIS — Z3200 Encounter for pregnancy test, result unknown: Secondary | ICD-10-CM | POA: Diagnosis not present

## 2024-02-02 DIAGNOSIS — Z1331 Encounter for screening for depression: Secondary | ICD-10-CM | POA: Diagnosis not present

## 2024-02-02 DIAGNOSIS — Z6836 Body mass index (BMI) 36.0-36.9, adult: Secondary | ICD-10-CM | POA: Diagnosis not present

## 2024-02-02 DIAGNOSIS — F330 Major depressive disorder, recurrent, mild: Secondary | ICD-10-CM | POA: Diagnosis not present

## 2024-02-05 ENCOUNTER — Ambulatory Visit: Payer: Self-pay | Admitting: Family Medicine

## 2024-02-05 ENCOUNTER — Ambulatory Visit (INDEPENDENT_AMBULATORY_CARE_PROVIDER_SITE_OTHER): Admitting: Family Medicine

## 2024-02-05 ENCOUNTER — Encounter: Payer: Self-pay | Admitting: Family Medicine

## 2024-02-05 VITALS — BP 173/90 | HR 84 | Ht 67.0 in | Wt 234.0 lb

## 2024-02-05 DIAGNOSIS — L0291 Cutaneous abscess, unspecified: Secondary | ICD-10-CM | POA: Diagnosis not present

## 2024-02-05 MED ORDER — DOXYCYCLINE HYCLATE 100 MG PO TABS: 100.0000 mg | ORAL_TABLET | Freq: Two times a day (BID) | ORAL | 0 refills | Status: AC

## 2024-02-05 NOTE — Progress Notes (Signed)
 Acute Office Visit  Subjective:     Patient ID: Renee Bryant, female    DOB: [birth year], 43 y.o.   MRN: [id number]  Chief Complaint  Patient presents with   Abscess    HPI Patient is in today for breast abscess.   Patient reports history of recurrent abscesses in various locations, especially her breasts. States she has not been able to find a cause for these and has had trouble with oral antibiotics in the pats, stating she has been admitted or IV antibiotics multiple times for abscess management. She is already established with ID and plans to discuss with them again at upcoming appointment later this week.   Current abscess is right breast near previous I&D scars. States it started about a week ago and for the first day or two it was draining, but has since stopped. Area has gotten hard, painful, red, and hot to the touch. States it has not "come to a head" to finish training like some of the others have. Reports pain when the area is touched 10/10, throbbing, occasional stabbing pain. Denies any systemic symptoms - no fevers, chills, body aches, tachycardia.      ROS All review of systems negative except what is listed in the HPI      Objective:    BP (!) 173/90   Pulse 84   Ht 5\' 7"  (1.702 m)   Wt 234 lb (106.1 kg)   SpO2 100%   BMI 36.65 kg/m    Physical Exam Constitutional:      Appearance: Normal appearance. She is obese.  Chest:       Comments: ~4 cm area of induration with spreading erythema/warmth, some fluctuance, no current drainage Neurological:     Mental Status: She is alert and oriented to person, place, and time.  Psychiatric:        Mood and Affect: Mood normal.        Behavior: Behavior normal.        Thought Content: Thought content normal.        Judgment: Judgment normal.     No results found for any visits on 02/05/24.      Assessment & Plan:   Problem List Items Addressed This Visit   None Visit Diagnoses       Abscess     -  Primary Discussed options with patient. She would like attempted I&D  (see below). Only small amount of bloody drainage, no purulent drainage obtained. Due to location I did not feel comfortable going any deeper. Treat with doxy and warm compresses. Discussed cleaning/dressing. She has follow-up with ID on Friday and will let them take another look. She is well aware of ED precautions as this is not a new occurrence for her.     Relevant Medications   doxycycline (VIBRA-TABS) 100 MG tablet      I & D  Date/Time: 02/05/2024 3:48 PM  Performed by: Clayborne Dana, NP Authorized by: Clayborne Dana, NP   Consent:    Consent obtained:  Verbal   Consent given by:  Patient   Risks, benefits, and alternatives were discussed: yes     Risks discussed:  Bleeding, incomplete drainage, pain and infection   Alternatives discussed:  Alternative treatment Location:    Type:  Abscess   Size:  ~4cm   Location: right breast. Pre-procedure details:    Skin preparation:  Alcohol Sedation:    Sedation type:  None Anesthesia:    Anesthesia method:  Topical application and local infiltration   Local anesthetic:  Lidocaine 1% WITH epi Procedure type:    Complexity:  Simple Procedure details:    Needle aspiration: no     Incision types:  Stab incision   Incision depth:  Dermal   Scalpel blade:  11   Wound management:  Probed and deloculated   Drainage:  Bloody   Drainage amount:  Scant   Wound treatment:  Wound left open   Packing material: covered with antibiotic ointment and gauze. Comments:     Small amount of bloody drainage, did not feel comfortable probing any deeper into breast tissue. Treat with doxycycline, warm compresses, keep clean. Patient aware of signs/symptoms requiring further/urgent evaluation.     Meds ordered this encounter  Medications   doxycycline (VIBRA-TABS) 100 MG tablet    Sig: Take 1 tablet (100 mg total) by mouth 2 (two) times daily for 7 days.    Dispense:  14  tablet    Refill:  0    Supervising Provider:   Danise Edge A [[id number]]    Return if symptoms worsen or fail to improve.  Clayborne Dana, NP

## 2024-02-05 NOTE — Telephone Encounter (Signed)
 Copied from CRM [id number]. Topic: Clinical - Red Word Triage >> Feb 05, 2024  8:00 AM Renee Bryant wrote: Red Word that prompted transfer to Nurse Triage: Very painful abscess on right breast.   Chief Complaint: abscess Symptoms: golf ball size abscess Frequency:  Pertinent Negatives: Patient denies fever Disposition: [] ED /[] Urgent Care (no appt availability in office) / [x] Appointment(In office/virtual)/ []  [facility]/ [] Home Care/ [] Refused Recommended Disposition /[] [facility]/ []  Follow-up with PCP Additional Notes: Pt with recurring abscess on right breast. Pt sts it feels like its coming to a head but it is very painful. Appt scheduled for this afternoon Reason for Disposition  Boil > 2 inches across (> 5 cm; larger than a golf ball or ping pong ball)  Answer Assessment - Initial Assessment Questions 1. APPEARANCE of BOIL: "What does the boil look like?"      Skin is thin, it's peeling, looks like it wants to come to a head. There are soft areas  2. LOCATION: "Where is the boil located?"      Right breast  3. NUMBER: "How many boils are there?"      Appears to be just one  4. SIZE: "How big is the boil?" (e.g., inches, cm; compare to size of a coin or other object)     "Slightly less than a gold ball"  5. ONSET: "When did the boil start?"     1 week ago  6. PAIN: "Is there any pain?" If Yes, ask: "How bad is the pain?"   (Scale 1-10; or mild, moderate, severe)     10/10 pain  7. FEVER: "Do you have a fever?" If Yes, ask: "What is it, how was it measured, and when did it start?"      No  8. SOURCE: "Have you been around anyone with boils or other Staph infections?" "Have you ever had boils before?"     Gets these abscesses regularly, was told that she has strep bacteria very frequently  9. OTHER SYMPTOMS: "Do you have any other symptoms?" (e.g., shaking chills, weakness, rash elsewhere on body)     Feels weak, but attributes it to the pain. Had  some chills last night  10. PREGNANCY: "Is there any chance you are pregnant?" "When was your last menstrual period?"       No, had tubal  Protocols used: Boil (Skin Abscess)-A-AH

## 2024-02-05 NOTE — Telephone Encounter (Signed)
 Appt today.  FYI.

## 2024-02-07 DIAGNOSIS — Z79899 Other long term (current) drug therapy: Secondary | ICD-10-CM | POA: Diagnosis not present

## 2024-02-07 DIAGNOSIS — E1122 Type 2 diabetes mellitus with diabetic chronic kidney disease: Secondary | ICD-10-CM | POA: Diagnosis not present

## 2024-02-07 DIAGNOSIS — I129 Hypertensive chronic kidney disease with stage 1 through stage 4 chronic kidney disease, or unspecified chronic kidney disease: Secondary | ICD-10-CM | POA: Diagnosis not present

## 2024-02-07 DIAGNOSIS — N611 Abscess of the breast and nipple: Secondary | ICD-10-CM | POA: Diagnosis not present

## 2024-02-07 DIAGNOSIS — N1832 Chronic kidney disease, stage 3b: Secondary | ICD-10-CM | POA: Diagnosis not present

## 2024-02-07 DIAGNOSIS — B20 Human immunodeficiency virus [HIV] disease: Secondary | ICD-10-CM | POA: Diagnosis not present

## 2024-02-07 DIAGNOSIS — N61 Mastitis without abscess: Secondary | ICD-10-CM | POA: Diagnosis not present

## 2024-02-07 DIAGNOSIS — Z794 Long term (current) use of insulin: Secondary | ICD-10-CM | POA: Diagnosis not present

## 2024-02-07 DIAGNOSIS — N183 Chronic kidney disease, stage 3 unspecified: Secondary | ICD-10-CM | POA: Diagnosis not present

## 2024-02-11 ENCOUNTER — Other Ambulatory Visit: Payer: Self-pay | Admitting: Family Medicine

## 2024-02-11 DIAGNOSIS — E559 Vitamin D deficiency, unspecified: Secondary | ICD-10-CM

## 2024-02-13 ENCOUNTER — Telehealth: Payer: Self-pay | Admitting: Emergency Medicine

## 2024-02-13 NOTE — Telephone Encounter (Signed)
 Copied from CRM [id number]. Topic: Clinical - Medical Advice >> Feb 13, 2024 12:44 PM Isabelle Course C wrote: Reason for CRM: patient had blood work done @ [facility] and everything was abnormal. Patient tyroid levels are really high and she is asking for some medication to keep it under control

## 2024-02-13 NOTE — Telephone Encounter (Signed)
Lvm for pt to call back to get scheduled

## 2024-02-13 NOTE — Telephone Encounter (Signed)
 Needs appt to discuss lab results/medication changes. Please call to schedule with patient.

## 2024-02-17 ENCOUNTER — Ambulatory Visit: Payer: Medicaid Other | Admitting: Family Medicine

## 2024-02-17 DIAGNOSIS — M898X9 Other specified disorders of bone, unspecified site: Secondary | ICD-10-CM | POA: Diagnosis not present

## 2024-02-17 DIAGNOSIS — N1832 Chronic kidney disease, stage 3b: Secondary | ICD-10-CM | POA: Diagnosis not present

## 2024-02-17 DIAGNOSIS — I129 Hypertensive chronic kidney disease with stage 1 through stage 4 chronic kidney disease, or unspecified chronic kidney disease: Secondary | ICD-10-CM | POA: Diagnosis not present

## 2024-02-17 DIAGNOSIS — E1122 Type 2 diabetes mellitus with diabetic chronic kidney disease: Secondary | ICD-10-CM | POA: Diagnosis not present

## 2024-02-17 DIAGNOSIS — E559 Vitamin D deficiency, unspecified: Secondary | ICD-10-CM | POA: Diagnosis not present

## 2024-02-17 DIAGNOSIS — Z794 Long term (current) use of insulin: Secondary | ICD-10-CM | POA: Diagnosis not present

## 2024-02-18 ENCOUNTER — Ambulatory Visit: Payer: Medicaid Other | Admitting: Family Medicine

## 2024-02-20 DIAGNOSIS — J441 Chronic obstructive pulmonary disease with (acute) exacerbation: Secondary | ICD-10-CM | POA: Diagnosis not present

## 2024-02-20 DIAGNOSIS — Z20822 Contact with and (suspected) exposure to covid-19: Secondary | ICD-10-CM | POA: Diagnosis not present

## 2024-02-20 DIAGNOSIS — R059 Cough, unspecified: Secondary | ICD-10-CM | POA: Diagnosis not present

## 2024-03-02 DIAGNOSIS — H3581 Retinal edema: Secondary | ICD-10-CM | POA: Diagnosis not present

## 2024-03-02 DIAGNOSIS — H52203 Unspecified astigmatism, bilateral: Secondary | ICD-10-CM | POA: Diagnosis not present

## 2024-03-02 DIAGNOSIS — E113513 Type 2 diabetes mellitus with proliferative diabetic retinopathy with macular edema, bilateral: Secondary | ICD-10-CM | POA: Diagnosis not present

## 2024-03-02 DIAGNOSIS — H5213 Myopia, bilateral: Secondary | ICD-10-CM | POA: Diagnosis not present

## 2024-03-02 DIAGNOSIS — H3589 Other specified retinal disorders: Secondary | ICD-10-CM | POA: Diagnosis not present

## 2024-03-02 DIAGNOSIS — Z794 Long term (current) use of insulin: Secondary | ICD-10-CM | POA: Diagnosis not present

## 2024-03-02 DIAGNOSIS — H25041 Posterior subcapsular polar age-related cataract, right eye: Secondary | ICD-10-CM | POA: Diagnosis not present

## 2024-03-02 DIAGNOSIS — H524 Presbyopia: Secondary | ICD-10-CM | POA: Diagnosis not present

## 2024-03-03 DIAGNOSIS — E1165 Type 2 diabetes mellitus with hyperglycemia: Secondary | ICD-10-CM | POA: Diagnosis not present

## 2024-03-03 DIAGNOSIS — E785 Hyperlipidemia, unspecified: Secondary | ICD-10-CM | POA: Diagnosis not present

## 2024-03-03 DIAGNOSIS — Z794 Long term (current) use of insulin: Secondary | ICD-10-CM | POA: Diagnosis not present

## 2024-03-03 DIAGNOSIS — I10 Essential (primary) hypertension: Secondary | ICD-10-CM | POA: Diagnosis not present

## 2024-03-03 DIAGNOSIS — E1169 Type 2 diabetes mellitus with other specified complication: Secondary | ICD-10-CM | POA: Diagnosis not present

## 2024-03-07 DIAGNOSIS — Z419 Encounter for procedure for purposes other than remedying health state, unspecified: Secondary | ICD-10-CM | POA: Diagnosis not present

## 2024-03-12 NOTE — Progress Notes (Signed)
 Cardiology Office Note:  .   Date:  03/13/2024  ID:  Stevan Eke, DOB July 24, 1981, MRN [id number] PCP: Everlina Hock, NP  [facility] Providers Cardiologist:  Oneil Bigness, MD { History of Present Illness: .    Chief Complaint  Patient presents with   Follow-up         Renee Bryant is a 43 y.o. female with history of CAD, HIV, HTN, HLD, tobacco abuse who presents for follow-up.    History of Present Illness   Renee Bryant is a 43 year old female with coronary artery disease, hypertension, and diabetes who presents for follow-up.  She experiences ongoing shortness of breath, particularly when walking from room to room, which she attributes to a previous heart attack. She mentions she experiences occasional chest discomfort, though not severe. She is currently on Ranexa  twice a day for angina, prescribed following a non-STEMI in June 2024. She also takes carvedilol  and Entresto  for heart function and blood pressure management.  Her history of hyperlipidemia is managed with atorvastatin  80 mg daily. She was prescribed Repatha  but has faced issues with refills due to pharmacy changes. She has made significant lifestyle changes, including quitting smoking in November and altering her diet to exclude fried foods, red meats, and pork. She has joined a gym and exercises three times a week.  Her diabetes management has improved significantly, with her A1c dropping from 15 to 7. She was recently switched from Humalog  to Mounjaro, which was increased from 2.5 to 5 mg. She experiences acid reflux since starting Mounjaro, described as having a metallic taste and causing burping. She has lost weight since the medication change, going from 240 pounds to 228 pounds in about three weeks.  She has a history of CKD stage 3B. She takes Lasix  as needed for swelling, which has decreased significantly. Her blood pressure has been mostly stable since changes to her medication regimen,  including carvedilol  and Entresto .  She recently developed symptoms consistent with plantar fasciitis, including pain and itching in the middle of her foot. She was prescribed Celebrex, which did not alleviate her symptoms, and plans to see a foot doctor for further evaluation.          Problem List CAD -NSTEMI 04/2023 -90% D1 -> med management  2. HLD -T chol 169, HDL 51, LDL 98, TG 112 3. HFmEF -EF 45-50% with WMA 4. DM -A1c 7.0  5. HIV 6. HTN 7. Tobacco abuse  -30 pack years  8. CKD IIIb    ROS: All other ROS reviewed and negative. Pertinent positives noted in the HPI.     Studies Reviewed: Aaron Aas       TTE 05/02/2023  1. Distal septal apical hypokinesis . Left ventricular ejection fraction,  by estimation, is 45 to 50%. The left ventricle has mildly decreased  function. The left ventricle demonstrates regional wall motion  abnormalities (see scoring diagram/findings for   description). The left ventricular internal cavity size was mildly  dilated. Left ventricular diastolic parameters were normal.   2. Right ventricular systolic function is normal. The right ventricular  size is normal.   3. The mitral valve is abnormal. Trivial mitral valve regurgitation. No  evidence of mitral stenosis.   4. The aortic valve was not well visualized. There is mild calcification  of the aortic valve. Aortic valve regurgitation is not visualized. Aortic  valve sclerosis is present, with no evidence of aortic valve stenosis.   5. The inferior vena cava is  normal in size with greater than 50%  respiratory variability, suggesting right atrial pressure of 3 mmHg.   LHC 05/03/2023   Prox LAD to Mid LAD lesion is 40% stenosed.   1st Diag lesion is 90% stenosed.   LV end diastolic pressure is normal.   Single vessel obstructive CAD involving the first diagonal.  Normal LVEDP Physical Exam:   VS:  BP (!) 130/90   Pulse 62   Ht 5\' 7"  (1.702 m)   Wt 228 lb (103.4 kg)   SpO2 100%   BMI 35.71  kg/m    Wt Readings from Last 3 Encounters:  03/13/24 228 lb (103.4 kg)  02/05/24 234 lb (106.1 kg)  11/19/23 231 lb (104.8 kg)    GEN: Well nourished, well developed in no acute distress NECK: No JVD; No carotid bruits CARDIAC: RRR, no murmurs, rubs, gallops RESPIRATORY:  Clear to auscultation without rales, wheezing or rhonchi  ABDOMEN: Soft, non-tender, non-distended EXTREMITIES:  No edema; No deformity  ASSESSMENT AND PLAN: .   Assessment and Plan    Coronary Artery Disease (CAD) with non-STEMI 90% D1 Non-STEMI in June 2024, managed medically. Intermittent dyspnea and occasional angina. Ejection fraction 45-50%. Small diagonal branch blockage managed medically. - Recheck echocardiogram to assess cardiac function. - Continue Ranexa  500 mg BID for angina. - Continue carvedilol  3.125 mg BID. - Continue Entresto  24/26 mg BID. - Continue aspirin  and Plavix  until June, then discontinue aspirin  and continue Plavix . - Encourage exercise, targeting 150 minutes per week.   HFmEF - euvolemic. Entresto  and coreg  as above  - lasix  PRN - no aldactone given CKD 3b  Hypertension Blood pressure well-controlled with carvedilol  and Entresto . Occasional elevations possibly situational. - Continue current antihypertensive regimen (carvedilol  and Entresto ). - Regularly monitor blood pressure.  Hyperlipidemia Lipids not at goal. Repatha  refill issues noted. Currently on atorvastatin  80 mg daily. - Recheck fasting lipid panel. - Continue atorvastatin  80 mg daily. - Address Repatha  refill issues and consider restarting if lipids remain elevated.  Type 2 Diabetes Mellitus A1c improved from 15 to 7.0. Transitioned from Humalog  to Mounjaro, increased from 2.5 to 5 mg. Reports weight loss and keto diet adherence. - Continue Mounjaro as prescribed. - Regularly monitor blood glucose levels. - Encourage dietary modifications and weight management.  Chronic Kidney Disease (CKD) Stage 3B CKD  stage 3B with well-managed kidney function. Diabetes management crucial to prevent further renal damage. - Regularly monitor kidney function. - Use Lasix  as needed for fluid management, considering renal function.              Follow-up: Return in about 1 year (around 03/13/2025).  Signed, Gigi Kyle. Rolm Clos, MD, [facility]  [facility]  [address] [place], [place] [address] [phone]  4:25 PM

## 2024-03-13 ENCOUNTER — Ambulatory Visit: Payer: Medicaid Other | Attending: Cardiovascular Disease | Admitting: Cardiovascular Disease

## 2024-03-13 ENCOUNTER — Encounter: Payer: Self-pay | Admitting: Cardiovascular Disease

## 2024-03-13 VITALS — BP 130/90 | HR 62 | Ht 67.0 in | Wt 228.0 lb

## 2024-03-13 DIAGNOSIS — I214 Non-ST elevation (NSTEMI) myocardial infarction: Secondary | ICD-10-CM

## 2024-03-13 DIAGNOSIS — I10 Essential (primary) hypertension: Secondary | ICD-10-CM | POA: Diagnosis not present

## 2024-03-13 DIAGNOSIS — R0602 Shortness of breath: Secondary | ICD-10-CM | POA: Diagnosis not present

## 2024-03-13 DIAGNOSIS — E785 Hyperlipidemia, unspecified: Secondary | ICD-10-CM

## 2024-03-13 DIAGNOSIS — I5022 Chronic systolic (congestive) heart failure: Secondary | ICD-10-CM

## 2024-03-13 DIAGNOSIS — I2510 Atherosclerotic heart disease of native coronary artery without angina pectoris: Secondary | ICD-10-CM

## 2024-03-13 DIAGNOSIS — Z720 Tobacco use: Secondary | ICD-10-CM

## 2024-03-13 NOTE — Patient Instructions (Signed)
 Medication Instructions:  - STOP ASPIRIN  81 MG IN [place]    *If you need a refill on your cardiac medications before your next appointment, please call your pharmacy*   Lab Work:Your physician recommends that you return for lab work FOR  FASTING LIPID PANEL     If you have labs (blood work) drawn today and your tests are completely normal, you will receive your results only by: MyChart Message (if you have MyChart) OR A paper copy in the mail If you have any lab test that is abnormal or we need to change your treatment, we will call you to review the results.   Testing/Procedures: Echo will be scheduled at [address].  Your physician has requested that you have an echocardiogram. Echocardiography is a painless test that uses sound waves to create images of your heart. It provides your doctor with information about the size and shape of your heart and how well your heart's chambers and valves are working. This procedure takes approximately one hour. There are no restrictions for this procedure. Please do NOT wear cologne, perfume, aftershave, or lotions (deodorant is allowed). Please arrive 15 minutes prior to your appointment time.    Follow-Up: At [facility], you and your health needs are our priority.  As part of our continuing mission to provide you with exceptional heart care, we have created designated Provider Care Teams.  These Care Teams include your primary Cardiologist (physician) and Advanced Practice Providers (APPs -  Physician Assistants and Nurse Practitioners) who all work together to provide you with the care you need, when you need it.  We recommend signing up for the patient portal called "MyChart".  Sign up information is provided on this After Visit Summary.  MyChart is used to connect with patients for Virtual Visits (Telemedicine).  Patients are able to view lab/test results, encounter notes, upcoming appointments, etc.  Non-urgent messages can be  sent to your provider as well.   To learn more about what you can do with MyChart, go to [email or web address].    Your next appointment:   1 year(s)  The format for your next appointment:   In Person  Provider:   Oneil Bigness, MD    Other Instructions

## 2024-03-16 DIAGNOSIS — E785 Hyperlipidemia, unspecified: Secondary | ICD-10-CM | POA: Diagnosis not present

## 2024-03-16 DIAGNOSIS — I214 Non-ST elevation (NSTEMI) myocardial infarction: Secondary | ICD-10-CM | POA: Diagnosis not present

## 2024-03-16 LAB — LIPID PANEL
Chol/HDL Ratio: 2.6 ratio (ref 0.0–4.4)
Cholesterol, Total: 136 mg/dL (ref 100–199)
HDL: 52 mg/dL (ref >39)
LDL Chol Calc (NIH): 66 mg/dL (ref 0–99)
Triglycerides: 95 mg/dL (ref 0–149)
VLDL Cholesterol Cal: 18 mg/dL (ref 5–40)

## 2024-03-17 DIAGNOSIS — M722 Plantar fascial fibromatosis: Secondary | ICD-10-CM | POA: Diagnosis not present

## 2024-03-18 ENCOUNTER — Encounter: Payer: Self-pay | Admitting: Cardiovascular Disease

## 2024-03-30 ENCOUNTER — Ambulatory Visit (HOSPITAL_COMMUNITY): Attending: Cardiovascular Disease

## 2024-03-30 DIAGNOSIS — I2510 Atherosclerotic heart disease of native coronary artery without angina pectoris: Secondary | ICD-10-CM | POA: Diagnosis not present

## 2024-03-30 LAB — ECHOCARDIOGRAM COMPLETE
AR max vel: 2.58 cm2
AV Area VTI: 2.68 cm2
AV Area mean vel: 2.46 cm2
AV Mean grad: 3 mmHg
AV Peak grad: 5.6 mmHg
Ao pk vel: 1.18 m/s
Area-P 1/2: 3.93 cm2
Calc EF: 56.2 %
S' Lateral: 2.8 cm
Single Plane A2C EF: 56.8 %
Single Plane A4C EF: 59.6 %

## 2024-03-31 ENCOUNTER — Encounter: Payer: Self-pay | Admitting: Cardiovascular Disease

## 2024-03-31 ENCOUNTER — Ambulatory Visit (INDEPENDENT_AMBULATORY_CARE_PROVIDER_SITE_OTHER): Admitting: Family Medicine

## 2024-03-31 ENCOUNTER — Encounter: Payer: Self-pay | Admitting: Family Medicine

## 2024-03-31 VITALS — BP 128/79 | HR 78 | Temp 97.0°F | Resp 18 | Ht 67.0 in | Wt 228.2 lb

## 2024-03-31 DIAGNOSIS — I214 Non-ST elevation (NSTEMI) myocardial infarction: Secondary | ICD-10-CM

## 2024-03-31 DIAGNOSIS — K219 Gastro-esophageal reflux disease without esophagitis: Secondary | ICD-10-CM | POA: Diagnosis not present

## 2024-03-31 DIAGNOSIS — F39 Unspecified mood [affective] disorder: Secondary | ICD-10-CM

## 2024-03-31 DIAGNOSIS — I10 Essential (primary) hypertension: Secondary | ICD-10-CM | POA: Diagnosis not present

## 2024-03-31 DIAGNOSIS — J45909 Unspecified asthma, uncomplicated: Secondary | ICD-10-CM

## 2024-03-31 DIAGNOSIS — E782 Mixed hyperlipidemia: Secondary | ICD-10-CM

## 2024-03-31 DIAGNOSIS — E559 Vitamin D deficiency, unspecified: Secondary | ICD-10-CM | POA: Diagnosis not present

## 2024-03-31 DIAGNOSIS — Z794 Long term (current) use of insulin: Secondary | ICD-10-CM | POA: Diagnosis not present

## 2024-03-31 DIAGNOSIS — N1831 Chronic kidney disease, stage 3a: Secondary | ICD-10-CM | POA: Diagnosis not present

## 2024-03-31 DIAGNOSIS — Z21 Asymptomatic human immunodeficiency virus [HIV] infection status: Secondary | ICD-10-CM

## 2024-03-31 DIAGNOSIS — E1165 Type 2 diabetes mellitus with hyperglycemia: Secondary | ICD-10-CM | POA: Diagnosis not present

## 2024-03-31 DIAGNOSIS — G629 Polyneuropathy, unspecified: Secondary | ICD-10-CM

## 2024-03-31 MED ORDER — LANSOPRAZOLE 30 MG PO TBDD: 30.0000 mg | DELAYED_RELEASE_TABLET | Freq: Every day | ORAL | 1 refills | Status: AC

## 2024-03-31 NOTE — Assessment & Plan Note (Signed)
 No new symptoms. Following with cardiology.

## 2024-03-31 NOTE — Assessment & Plan Note (Signed)
 Blood pressure is now at goal for age and co-morbidities.   Recommendations: continue current meds.  - BP goal <130/80 - monitor and log blood pressures at home - check around the same time each day in a relaxed setting - Limit salt to <2000 mg/day - Follow DASH eating plan (heart healthy diet) - limit alcohol to 2 standard drinks per day for men and 1 per day for women - avoid tobacco products - get at least 2 hours of regular aerobic exercise weekly Patient aware of signs/symptoms requiring further/urgent evaluation. Labs stable at recently Keep cardiology follow-ups

## 2024-03-31 NOTE — Progress Notes (Signed)
 Established Patient Office Visit  Subjective    Patient ID: Renee Bryant, female    DOB: February 19, 1981  Age: 43 y.o. MRN: [id number]  CC:  Chief Complaint  Patient presents with   Follow-up    HPI Renee Bryant presents for routine follow-up and chronic disease management.   She has been doing well overall, but feeling tired.    Diabetes: - Checking glucose at home: Renee Bryant - Medications: Lantus  40 units nightly; Mounjaro 5 mg/week - Compliance: good - Diet: heart healthy, diabetic - Exercise: gym 3x week, cardio and strength training - Eye exam: UTD, started wearing glasses, some retinopathy - Foot exam: unknown - Microalbumin: 07/08/23 high, following with nephrology  - Denies symptoms of hypoglycemia, polyuria, polydipsia, numbness extremities, foot ulcers/trauma, wounds that are not healing, medication side effects  - She is following with [facility]. She was at 7.0% in April    HTN, HLD, CAD (NSTEMI 05/02/23 -proximal to mid LAD 40%, D1 90% not amenable to PCI secondary to concern for plaque shift/impingement of the LAD. Aggressive medical therapy and risk factor modification was recommended; EF 45-50%) : - Medications: aspirin  81 mg daily (until June), Plavix  75 mg daily, Lasix  10-20 mg every other day PRN (she has not been needing, no recent edema), carvedilol  3.125 mg BID, ranexa  500 mg BID, Entresto  24-26 mg BID, atorvastatin  80 mg daily, (has not taken Repatha  recently d/t pharmacy issues - cardiology aware, recent lipids much better) - Compliance: good - Checking BP at home: rarely - Denies any SOB, recurrent headaches, CP, vision changes, LE edema, dizziness, palpitations, or medication side effects. - Diet: trying for heart healthy diabetic diet - Exercise: minimal - Following with [facility]- saw them last week cholesterol looking better, ordered repeat Echo - Echo 03/30/2024: results pending   Wt Readings from Last 3 Encounters:   03/31/24 228 lb 3.2 oz (103.5 kg)  03/13/24 228 lb (103.4 kg)  02/05/24 234 lb (106.1 kg)    HIV: - [facility] Infection Disease - Biktarvy    CKD 3: - [facility] - labs improving, increased Vitamin D  supplement   Asthma/Smoking: - [facility] - States they ruled out COPD at recent visit. She quit smoking in November   Vitamin D  deficiency: - Nephrology started her on 50,000 units twice a week plus 2,000 units daily   GERD: - having some reflux and sulfur burps with the Mounjaro. She was taking OTC prevacid  BID without much improvement.      Wt Readings from Last 3 Encounters:  03/31/24 228 lb 3.2 oz (103.5 kg)  03/13/24 228 lb (103.4 kg)  02/05/24 234 lb (106.1 kg)      ROS All review of systems negative except what is listed in the HPI      Objective    BP 128/79   Pulse 78   Temp (!) 97 F (36.1 C) (Oral)   Resp 18   Ht 5\' 7"  (1.702 m)   Wt 228 lb 3.2 oz (103.5 kg)   SpO2 99%   BMI 35.74 kg/m   Physical Exam Vitals reviewed.  Constitutional:      Appearance: Normal appearance.  Cardiovascular:     Rate and Rhythm: Normal rate and regular rhythm.     Heart sounds: Normal heart sounds.  Pulmonary:     Effort: Pulmonary effort is normal.     Breath sounds: Normal breath sounds.  Musculoskeletal:     Comments: Minimal BLE non-pitting edema  Skin:  General: Skin is warm and dry.  Neurological:     Mental Status: She is alert and oriented to person, place, and time.  Psychiatric:        Mood and Affect: Mood normal.        Behavior: Behavior normal.        Thought Content: Thought content normal.        Judgment: Judgment normal.         Assessment & Plan:   Problem List Items Addressed This Visit       Active Problems   HIV (human immunodeficiency virus infection) (HCC) (Chronic)   Well-managed with undetectable levels for the past 15 years. Currently on Biktarvy . -Continue current management with infectious disease  specialist.      Essential hypertension (Chronic)   Blood pressure is now at goal for age and co-morbidities.   Recommendations: continue current meds.  - BP goal <130/80 - monitor and log blood pressures at home - check around the same time each day in a relaxed setting - Limit salt to <2000 mg/day - Follow DASH eating plan (heart healthy diet) - limit alcohol to 2 standard drinks per day for men and 1 per day for women - avoid tobacco products - get at least 2 hours of regular aerobic exercise weekly Patient aware of signs/symptoms requiring further/urgent evaluation. Labs stable at recently Keep cardiology follow-ups       Uncontrolled type 2 diabetes mellitus with hyperglycemia, with long-term current use of insulin  (HCC) - Primary (Chronic)   Managed with Mounjaro and Lantus . Blood glucose levels stable, A1c at 7.0. No longer on Humalog . Dietary adjustments made for protein intake. Regular exercise maintained. - Continue Mounjaro 5 mg. - Continue Lantus  40 units. - Continue dietary adjustments with smoothies for protein intake. - Continue regular exercise regimen. Following with endocrinology       CKD stage 3a, GFR 45-59 ml/min (HCC) (Chronic)   Following with [facility]  Continue regular follow-up      Mixed hyperlipidemia (Chronic)   Following with cardiology Lifestyle factors for lowering cholesterol include: Diet therapy - heart-healthy diet rich in fruits, veggies, fiber-rich whole grains, lean meats, chicken, fish (at least twice a week), fat-free or 1% Renee products; foods low in saturated/trans fats, cholesterol, sodium, and sugar. Mediterranean diet has shown to be very heart healthy. Regular exercise - recommend at least 30 minutes a day, 5 times per week Weight management        Mood disorder (HCC) (Chronic)   Stable. Doing well. No SI/HI.      Vitamin D  deficiency (Chronic)   Nephrology increased her supplement. Keep next follow-up with them  to recheck levels      NSTEMI (non-ST elevated myocardial infarction) (HCC)   No new symptoms. Following with cardiology.       Neuropathy   Following with podiatry, also has plantar fasciitis component now. Previously failed gabapentin and  Lyrica      Esophageal reflux   Poor tolerance to Protonix  -Switch to Prevacid  15-30mg  daily and PRN Pepcid      Relevant Medications   lansoprazole  (PREVACID  SOLUTAB) 30 MG disintegrating tablet   Asthma   Following with [facility] No acute concerns/symptoms            Return in about 6 months (around 10/01/2024) for physical.   Renee Hock, NP

## 2024-03-31 NOTE — Assessment & Plan Note (Signed)
Well-managed with undetectable levels for the past 15 years. Currently on Biktarvy. -Continue current management with infectious disease specialist.

## 2024-03-31 NOTE — Assessment & Plan Note (Signed)
 Poor tolerance to Protonix  -Switch to Prevacid  15-30mg  daily and PRN Pepcid

## 2024-03-31 NOTE — Assessment & Plan Note (Signed)
 Stable. Doing well. No SI/HI.

## 2024-03-31 NOTE — Assessment & Plan Note (Signed)
 Nephrology increased her supplement. Keep next follow-up with them to recheck levels

## 2024-03-31 NOTE — Assessment & Plan Note (Signed)
 Following with [facility]  Continue regular follow-up

## 2024-03-31 NOTE — Assessment & Plan Note (Signed)
 Following with cardiology Lifestyle factors for lowering cholesterol include: Diet therapy - heart-healthy diet rich in fruits, veggies, fiber-rich whole grains, lean meats, chicken, fish (at least twice a week), fat-free or 1% dairy products; foods low in saturated/trans fats, cholesterol, sodium, and sugar. Mediterranean diet has shown to be very heart healthy. Regular exercise - recommend at least 30 minutes a day, 5 times per week Weight management

## 2024-03-31 NOTE — Assessment & Plan Note (Signed)
 Managed with Mounjaro and Lantus . Blood glucose levels stable, A1c at 7.0. No longer on Humalog . Dietary adjustments made for protein intake. Regular exercise maintained. - Continue Mounjaro 5 mg. - Continue Lantus  40 units. - Continue dietary adjustments with smoothies for protein intake. - Continue regular exercise regimen. Following with endocrinology

## 2024-03-31 NOTE — Assessment & Plan Note (Signed)
 Following with podiatry, also has plantar fasciitis component now. Previously failed gabapentin and  Lyrica

## 2024-03-31 NOTE — Assessment & Plan Note (Signed)
Following with [facility] No acute concerns/symptoms

## 2024-04-06 DIAGNOSIS — Z419 Encounter for procedure for purposes other than remedying health state, unspecified: Secondary | ICD-10-CM | POA: Diagnosis not present

## 2024-04-14 DIAGNOSIS — M722 Plantar fascial fibromatosis: Secondary | ICD-10-CM | POA: Diagnosis not present

## 2024-04-23 ENCOUNTER — Telehealth: Payer: Self-pay

## 2024-04-23 DIAGNOSIS — I10 Essential (primary) hypertension: Secondary | ICD-10-CM

## 2024-04-27 ENCOUNTER — Telehealth: Payer: Self-pay | Admitting: *Deleted

## 2024-04-27 NOTE — Progress Notes (Unsigned)
 Complex Care Management Note Care Guide Note  04/27/2024 Name: Rashema Seawright MRN: [id number] DOB: 1981/06/24   Complex Care Management Outreach Attempts: An unsuccessful telephone outreach was attempted today to offer the patient information about available complex care management services.  Follow Up Plan:  Additional outreach attempts will be made to offer the patient complex care management information and services.   Encounter Outcome:  No Answer  Barnie Bora  [facility]  [facility], [facility] Guide  Direct Dial : [phone]  Fax [phone]

## 2024-04-28 NOTE — Progress Notes (Unsigned)
 Complex Care Management Note Care Guide Note  04/28/2024 Name: Renee Bryant MRN: [id number] DOB: 19-Aug-1981   Complex Care Management Outreach Attempts: A second unsuccessful outreach was attempted today to offer the patient with information about available complex care management services.  Follow Up Plan:  Additional outreach attempts will be made to offer the patient complex care management information and services.   Encounter Outcome:  No Answer  Barnie Bora  [facility]  [facility], [facility] Guide  Direct Dial : [phone]  Fax [phone]

## 2024-04-29 NOTE — Progress Notes (Signed)
 Complex Care Management Note Care Guide Note  04/29/2024 Name: Renee Bryant MRN: [id number] DOB: Oct 01, 1981   Complex Care Management Outreach Attempts: A third unsuccessful outreach was attempted today to offer the patient with information about available complex care management services.  Follow Up Plan:  No further outreach attempts will be made at this time. We have been unable to contact the patient to offer or enroll patient in complex care management services.  Encounter Outcome:  No Answer  Barnie Bora  [facility]  [facility], [facility] Guide  Direct Dial : [phone]  Fax [phone]

## 2024-05-01 ENCOUNTER — Ambulatory Visit: Payer: Self-pay

## 2024-05-02 DIAGNOSIS — E1165 Type 2 diabetes mellitus with hyperglycemia: Secondary | ICD-10-CM | POA: Diagnosis not present

## 2024-05-02 DIAGNOSIS — I130 Hypertensive heart and chronic kidney disease with heart failure and stage 1 through stage 4 chronic kidney disease, or unspecified chronic kidney disease: Secondary | ICD-10-CM | POA: Diagnosis not present

## 2024-05-02 DIAGNOSIS — K219 Gastro-esophageal reflux disease without esophagitis: Secondary | ICD-10-CM | POA: Diagnosis not present

## 2024-05-02 DIAGNOSIS — J45909 Unspecified asthma, uncomplicated: Secondary | ICD-10-CM | POA: Diagnosis not present

## 2024-05-02 DIAGNOSIS — E113519 Type 2 diabetes mellitus with proliferative diabetic retinopathy with macular edema, unspecified eye: Secondary | ICD-10-CM | POA: Diagnosis not present

## 2024-05-02 DIAGNOSIS — I509 Heart failure, unspecified: Secondary | ICD-10-CM | POA: Diagnosis not present

## 2024-05-02 DIAGNOSIS — I25119 Atherosclerotic heart disease of native coronary artery with unspecified angina pectoris: Secondary | ICD-10-CM | POA: Diagnosis not present

## 2024-05-02 DIAGNOSIS — I252 Old myocardial infarction: Secondary | ICD-10-CM | POA: Diagnosis not present

## 2024-05-02 DIAGNOSIS — B20 Human immunodeficiency virus [HIV] disease: Secondary | ICD-10-CM | POA: Diagnosis not present

## 2024-05-02 DIAGNOSIS — R32 Unspecified urinary incontinence: Secondary | ICD-10-CM | POA: Diagnosis not present

## 2024-05-02 DIAGNOSIS — N1832 Chronic kidney disease, stage 3b: Secondary | ICD-10-CM | POA: Diagnosis not present

## 2024-05-07 DIAGNOSIS — F330 Major depressive disorder, recurrent, mild: Secondary | ICD-10-CM | POA: Diagnosis not present

## 2024-05-07 DIAGNOSIS — E6609 Other obesity due to excess calories: Secondary | ICD-10-CM | POA: Diagnosis not present

## 2024-05-07 DIAGNOSIS — F411 Generalized anxiety disorder: Secondary | ICD-10-CM | POA: Diagnosis not present

## 2024-05-07 DIAGNOSIS — Z3200 Encounter for pregnancy test, result unknown: Secondary | ICD-10-CM | POA: Diagnosis not present

## 2024-05-07 DIAGNOSIS — F4310 Post-traumatic stress disorder, unspecified: Secondary | ICD-10-CM | POA: Diagnosis not present

## 2024-05-07 DIAGNOSIS — Z79899 Other long term (current) drug therapy: Secondary | ICD-10-CM | POA: Diagnosis not present

## 2024-05-07 DIAGNOSIS — E1165 Type 2 diabetes mellitus with hyperglycemia: Secondary | ICD-10-CM | POA: Diagnosis not present

## 2024-05-07 DIAGNOSIS — Z419 Encounter for procedure for purposes other than remedying health state, unspecified: Secondary | ICD-10-CM | POA: Diagnosis not present

## 2024-05-07 DIAGNOSIS — G4700 Insomnia, unspecified: Secondary | ICD-10-CM | POA: Diagnosis not present

## 2024-05-07 DIAGNOSIS — M722 Plantar fascial fibromatosis: Secondary | ICD-10-CM | POA: Diagnosis not present

## 2024-05-07 DIAGNOSIS — Z6836 Body mass index (BMI) 36.0-36.9, adult: Secondary | ICD-10-CM | POA: Diagnosis not present

## 2024-05-07 DIAGNOSIS — F410 Panic disorder [episodic paroxysmal anxiety] without agoraphobia: Secondary | ICD-10-CM | POA: Diagnosis not present

## 2024-05-11 ENCOUNTER — Inpatient Hospital Stay (HOSPITAL_BASED_OUTPATIENT_CLINIC_OR_DEPARTMENT_OTHER)
Admission: EM | Admit: 2024-05-11 | Discharge: 2024-05-13 | DRG: 682 | Disposition: A | Discharge status: Home / Self Care | Attending: Family Medicine | Admitting: Family Medicine

## 2024-05-11 ENCOUNTER — Emergency Department (HOSPITAL_BASED_OUTPATIENT_CLINIC_OR_DEPARTMENT_OTHER)

## 2024-05-11 ENCOUNTER — Encounter (HOSPITAL_BASED_OUTPATIENT_CLINIC_OR_DEPARTMENT_OTHER): Payer: Self-pay

## 2024-05-11 ENCOUNTER — Other Ambulatory Visit: Payer: Self-pay

## 2024-05-11 DIAGNOSIS — R918 Other nonspecific abnormal finding of lung field: Secondary | ICD-10-CM | POA: Diagnosis not present

## 2024-05-11 DIAGNOSIS — R519 Headache, unspecified: Secondary | ICD-10-CM | POA: Diagnosis present

## 2024-05-11 DIAGNOSIS — Z7985 Long-term (current) use of injectable non-insulin antidiabetic drugs: Secondary | ICD-10-CM | POA: Diagnosis not present

## 2024-05-11 DIAGNOSIS — J189 Pneumonia, unspecified organism: Principal | ICD-10-CM

## 2024-05-11 DIAGNOSIS — Z21 Asymptomatic human immunodeficiency virus [HIV] infection status: Secondary | ICD-10-CM | POA: Diagnosis not present

## 2024-05-11 DIAGNOSIS — R6883 Chills (without fever): Secondary | ICD-10-CM | POA: Diagnosis not present

## 2024-05-11 DIAGNOSIS — E559 Vitamin D deficiency, unspecified: Secondary | ICD-10-CM | POA: Diagnosis present

## 2024-05-11 DIAGNOSIS — Z87891 Personal history of nicotine dependence: Secondary | ICD-10-CM

## 2024-05-11 DIAGNOSIS — J159 Unspecified bacterial pneumonia: Secondary | ICD-10-CM | POA: Diagnosis not present

## 2024-05-11 DIAGNOSIS — Z6841 Body Mass Index (BMI) 40.0 and over, adult: Secondary | ICD-10-CM

## 2024-05-11 DIAGNOSIS — J984 Other disorders of lung: Secondary | ICD-10-CM | POA: Diagnosis not present

## 2024-05-11 DIAGNOSIS — I5042 Chronic combined systolic (congestive) and diastolic (congestive) heart failure: Secondary | ICD-10-CM | POA: Diagnosis not present

## 2024-05-11 DIAGNOSIS — I130 Hypertensive heart and chronic kidney disease with heart failure and stage 1 through stage 4 chronic kidney disease, or unspecified chronic kidney disease: Secondary | ICD-10-CM | POA: Diagnosis present

## 2024-05-11 DIAGNOSIS — Z1152 Encounter for screening for COVID-19: Secondary | ICD-10-CM

## 2024-05-11 DIAGNOSIS — J439 Emphysema, unspecified: Secondary | ICD-10-CM | POA: Diagnosis present

## 2024-05-11 DIAGNOSIS — E1142 Type 2 diabetes mellitus with diabetic polyneuropathy: Secondary | ICD-10-CM | POA: Diagnosis present

## 2024-05-11 DIAGNOSIS — I252 Old myocardial infarction: Secondary | ICD-10-CM

## 2024-05-11 DIAGNOSIS — N1831 Chronic kidney disease, stage 3a: Secondary | ICD-10-CM | POA: Diagnosis present

## 2024-05-11 DIAGNOSIS — Z7902 Long term (current) use of antithrombotics/antiplatelets: Secondary | ICD-10-CM

## 2024-05-11 DIAGNOSIS — N179 Acute kidney failure, unspecified: Secondary | ICD-10-CM | POA: Diagnosis not present

## 2024-05-11 DIAGNOSIS — Z888 Allergy status to other drugs, medicaments and biological substances status: Secondary | ICD-10-CM | POA: Diagnosis not present

## 2024-05-11 DIAGNOSIS — D631 Anemia in chronic kidney disease: Secondary | ICD-10-CM | POA: Diagnosis not present

## 2024-05-11 DIAGNOSIS — N1832 Chronic kidney disease, stage 3b: Secondary | ICD-10-CM | POA: Diagnosis present

## 2024-05-11 DIAGNOSIS — I10 Essential (primary) hypertension: Secondary | ICD-10-CM | POA: Diagnosis present

## 2024-05-11 DIAGNOSIS — J440 Chronic obstructive pulmonary disease with acute lower respiratory infection: Secondary | ICD-10-CM | POA: Diagnosis not present

## 2024-05-11 DIAGNOSIS — Z7982 Long term (current) use of aspirin: Secondary | ICD-10-CM | POA: Diagnosis not present

## 2024-05-11 DIAGNOSIS — Z79899 Other long term (current) drug therapy: Secondary | ICD-10-CM | POA: Diagnosis not present

## 2024-05-11 DIAGNOSIS — E871 Hypo-osmolality and hyponatremia: Secondary | ICD-10-CM | POA: Diagnosis present

## 2024-05-11 DIAGNOSIS — Z9049 Acquired absence of other specified parts of digestive tract: Secondary | ICD-10-CM

## 2024-05-11 DIAGNOSIS — R0602 Shortness of breath: Secondary | ICD-10-CM | POA: Diagnosis not present

## 2024-05-11 DIAGNOSIS — E1122 Type 2 diabetes mellitus with diabetic chronic kidney disease: Secondary | ICD-10-CM | POA: Diagnosis not present

## 2024-05-11 DIAGNOSIS — E785 Hyperlipidemia, unspecified: Secondary | ICD-10-CM | POA: Diagnosis present

## 2024-05-11 DIAGNOSIS — E1165 Type 2 diabetes mellitus with hyperglycemia: Secondary | ICD-10-CM | POA: Diagnosis present

## 2024-05-11 DIAGNOSIS — E66812 Obesity, class 2: Secondary | ICD-10-CM | POA: Diagnosis present

## 2024-05-11 DIAGNOSIS — I2510 Atherosclerotic heart disease of native coronary artery without angina pectoris: Secondary | ICD-10-CM | POA: Diagnosis present

## 2024-05-11 DIAGNOSIS — R079 Chest pain, unspecified: Secondary | ICD-10-CM | POA: Diagnosis not present

## 2024-05-11 DIAGNOSIS — Z9861 Coronary angioplasty status: Secondary | ICD-10-CM

## 2024-05-11 LAB — URINALYSIS, W/ REFLEX TO CULTURE (INFECTION SUSPECTED)
Bilirubin Urine: NEGATIVE
Glucose, UA: 100 mg/dL — AB
Ketones, ur: NEGATIVE mg/dL
Leukocytes,Ua: NEGATIVE
Nitrite: NEGATIVE
Protein, ur: >=300 mg/dL — AB
Specific Gravity, Urine: 1.02 (ref 1.005–1.030)
pH: 7 (ref 5.0–8.0)

## 2024-05-11 LAB — RESPIRATORY PANEL BY PCR

## 2024-05-11 LAB — CBC
HCT: 32.3 % — ABNORMAL LOW (ref 36.0–46.0)
HCT: 31.1 % — ABNORMAL LOW (ref 36.0–46.0)
Hemoglobin: 11.1 g/dL — ABNORMAL LOW (ref 12.0–15.0)
Hemoglobin: 10.2 g/dL — ABNORMAL LOW (ref 12.0–15.0)
MCH: 32.7 pg (ref 26.0–34.0)
MCH: 33 pg (ref 26.0–34.0)
MCHC: 34.4 g/dL (ref 30.0–36.0)
MCHC: 32.8 g/dL (ref 30.0–36.0)
MCV: 95.3 fL (ref 80.0–100.0)
MCV: 100.6 fL — ABNORMAL HIGH (ref 80.0–100.0)
Platelets: 256 10*3/uL (ref 150–400)
Platelets: 220 10*3/uL (ref 150–400)
RBC: 3.39 MIL/uL — ABNORMAL LOW (ref 3.87–5.11)
RBC: 3.09 MIL/uL — ABNORMAL LOW (ref 3.87–5.11)
RDW: 11.7 % (ref 11.5–15.5)
RDW: 11.7 % (ref 11.5–15.5)
WBC: 13.2 10*3/uL — ABNORMAL HIGH (ref 4.0–10.5)
WBC: 13.8 10*3/uL — ABNORMAL HIGH (ref 4.0–10.5)
nRBC: 0 % (ref 0.0–0.2)
nRBC: 0 % (ref 0.0–0.2)

## 2024-05-11 LAB — RESP PANEL BY RT-PCR (RSV, FLU A&B, COVID)  RVPGX2
Influenza A by PCR: NEGATIVE
Influenza B by PCR: NEGATIVE
Resp Syncytial Virus by PCR: NEGATIVE
SARS Coronavirus 2 by RT PCR: NEGATIVE

## 2024-05-11 LAB — BASIC METABOLIC PANEL WITH GFR
Anion gap: 12 (ref 5–15)
BUN: 28 mg/dL — ABNORMAL HIGH (ref 6–20)
CO2: 22 mmol/L (ref 22–32)
Calcium: 8.2 mg/dL — ABNORMAL LOW (ref 8.9–10.3)
Chloride: 97 mmol/L — ABNORMAL LOW (ref 98–111)
Creatinine, Ser: 2.79 mg/dL — ABNORMAL HIGH (ref 0.44–1.00)
GFR, Estimated: 21 mL/min — ABNORMAL LOW (ref >60)
Glucose, Bld: 264 mg/dL — ABNORMAL HIGH (ref 70–99)
Potassium: 4.6 mmol/L (ref 3.5–5.1)
Sodium: 132 mmol/L — ABNORMAL LOW (ref 135–145)

## 2024-05-11 LAB — TROPONIN T, HIGH SENSITIVITY
Troponin T High Sensitivity: 40 ng/L — ABNORMAL HIGH (ref <19)
Troponin T High Sensitivity: 35 ng/L — ABNORMAL HIGH (ref <19)

## 2024-05-11 LAB — HEPATIC FUNCTION PANEL
ALT: 17 U/L (ref 0–44)
AST: 21 U/L (ref 15–41)
Albumin: 2.5 g/dL — ABNORMAL LOW (ref 3.5–5.0)
Alkaline Phosphatase: 68 U/L (ref 38–126)
Bilirubin, Direct: 0.2 mg/dL (ref 0.0–0.2)
Indirect Bilirubin: 0.1 mg/dL — ABNORMAL LOW (ref 0.3–0.9)
Total Bilirubin: 0.3 mg/dL (ref 0.0–1.2)
Total Protein: 5.9 g/dL — ABNORMAL LOW (ref 6.5–8.1)

## 2024-05-11 LAB — PROTIME-INR
INR: 1.2 (ref 0.8–1.2)
Prothrombin Time: 15.1 s (ref 11.4–15.2)

## 2024-05-11 LAB — CREATININE, SERUM
Creatinine, Ser: 2.77 mg/dL — ABNORMAL HIGH (ref 0.44–1.00)
GFR, Estimated: 21 mL/min — ABNORMAL LOW (ref >60)

## 2024-05-11 LAB — PREGNANCY, URINE: Preg Test, Ur: NEGATIVE

## 2024-05-11 LAB — LIPASE, BLOOD: Lipase: 31 U/L (ref 11–51)

## 2024-05-11 LAB — PROCALCITONIN: Procalcitonin: 0.16 ng/mL

## 2024-05-11 MED ORDER — ACETAMINOPHEN 325 MG PO TABS
650.0000 mg | ORAL_TABLET | Freq: Four times a day (QID) | ORAL | Status: DC | PRN
  Administered 2024-05-11 – 2024-05-12 (×3): 650 mg via ORAL
  Filled 2024-05-11 (×3): qty 2

## 2024-05-11 MED ORDER — SODIUM CHLORIDE 0.9 % IV BOLUS
500.0000 mL | Freq: Once | INTRAVENOUS | Status: AC
  Administered 2024-05-11: 500 mL via INTRAVENOUS

## 2024-05-11 MED ORDER — ACETAMINOPHEN 650 MG RE SUPP: 650.0000 mg | Freq: Four times a day (QID) | RECTAL | Status: DC | PRN

## 2024-05-11 MED ORDER — SODIUM CHLORIDE 0.9 % IV SOLN: Freq: Once | INTRAVENOUS | Status: AC

## 2024-05-11 MED ORDER — ENOXAPARIN SODIUM 40 MG/0.4ML IJ SOSY
40.0000 mg | PREFILLED_SYRINGE | Freq: Every day | INTRAMUSCULAR | Status: DC
  Administered 2024-05-11 – 2024-05-12 (×2): 40 mg via SUBCUTANEOUS
  Filled 2024-05-11 (×2): qty 0.4

## 2024-05-11 MED ORDER — SODIUM CHLORIDE 0.9 % IV BOLUS
1000.0000 mL | Freq: Once | INTRAVENOUS | Status: AC
  Administered 2024-05-11: 1000 mL via INTRAVENOUS

## 2024-05-11 MED ORDER — SODIUM CHLORIDE 0.9 % IV SOLN
500.0000 mg | Freq: Once | INTRAVENOUS | Status: AC
  Administered 2024-05-11: 500 mg via INTRAVENOUS
  Filled 2024-05-11: qty 5

## 2024-05-11 MED ORDER — SODIUM CHLORIDE 0.9 % IV SOLN
1.0000 g | Freq: Once | INTRAVENOUS | Status: AC
  Administered 2024-05-11: 1 g via INTRAVENOUS
  Filled 2024-05-11: qty 10

## 2024-05-11 MED ORDER — ONDANSETRON HCL 4 MG/2ML IJ SOLN: 4.0000 mg | Freq: Four times a day (QID) | INTRAMUSCULAR | Status: DC | PRN

## 2024-05-11 MED ORDER — ONDANSETRON HCL 4 MG PO TABS: 4.0000 mg | ORAL_TABLET | Freq: Four times a day (QID) | ORAL | Status: DC | PRN

## 2024-05-11 NOTE — ED Provider Notes (Signed)
 Emergency Department Provider Note   I have reviewed the triage vital signs and the nursing notes.   HISTORY  Chief Complaint Shortness of Breath   HPI Renee Bryant is a 43 y.o. female with past history reviewed below presents to the emergency department with significant fatigue, headache, chest heaviness.  Symptoms been present for the past 4 days.  She has worsening heaviness in her chest with movement.  No sharp, pleuritic pains.  No shortness of breath.  No vomiting or diarrhea.  No fevers.  She has an associated dull headache.  No sudden onset, maximal intensity headache symptoms.  She was fatigued to the point that she asked her significant other to drive her to the ED today.  Has had very poor appetite over the past several days and suspects that she may be dehydrated as well.   Past Medical History:  Diagnosis Date   Abscess    Asthma    CHF (congestive heart failure) (HCC)    COPD (chronic obstructive pulmonary disease) (HCC)    Coronary artery disease    Diabetes mellitus without complication (HCC)    Emphysema of lung (HCC)    HIV (human immunodeficiency virus infection) (HCC)    Hypertension    Pneumonia     Review of Systems  Constitutional: No fever/chills. Positive fatigue.  Cardiovascular: Positive chest pain. Respiratory: Denies shortness of breath. Gastrointestinal: No abdominal pain.  No nausea, no vomiting.   Genitourinary: Negative for dysuria. Musculoskeletal: Negative for back pain. Skin: Negative for rash. Neurological: Positive HA.  ____________________________________________   PHYSICAL EXAM:  VITAL SIGNS: ED Triage Vitals  Encounter Vitals Group     BP 05/11/24 1032 97/68     Pulse Rate 05/11/24 1032 86     Resp 05/11/24 1032 18     Temp 05/11/24 1032 98.4 F (36.9 C)     Temp src --      SpO2 05/11/24 1032 97 %     Weight 05/11/24 1031 230 lb (104.3 kg)   Constitutional: Alert and oriented. Well appearing and in no acute  distress. Eyes: Conjunctivae are normal.  Head: Atraumatic. Nose: No congestion/rhinnorhea. Mouth/Throat: Mucous membranes are dry.  Neck: No stridor.   Cardiovascular: Normal rate, regular rhythm. Good peripheral circulation. Grossly normal heart sounds.   Respiratory: Normal respiratory effort.  No retractions. Lungs CTAB. Gastrointestinal: Soft and nontender. No distention.  Musculoskeletal: No lower extremity tenderness nor edema. No gross deformities of extremities. Neurologic:  Normal speech and language. No gross focal neurologic deficits are appreciated.  Skin:  Skin is warm, dry and intact. No rash noted.  ____________________________________________   LABS (all labs ordered are listed, but only abnormal results are displayed)  Labs Reviewed  CBC - Abnormal; Notable for the following components:      Result Value   WBC 13.2 (*)    RBC 3.39 (*)    Hemoglobin 11.1 (*)    HCT 32.3 (*)    All other components within normal limits  PROTIME-INR  BASIC METABOLIC PANEL WITH GFR  PREGNANCY, URINE  HEPATIC FUNCTION PANEL  LIPASE, BLOOD  URINALYSIS, W/ REFLEX TO CULTURE (INFECTION SUSPECTED)  TROPONIN T, HIGH SENSITIVITY   ____________________________________________  EKG   EKG Interpretation Date/Time:  Monday May 11 2024 10:24:34 EDT Ventricular Rate:  84 PR Interval:  144 QRS Duration:  85 QT Interval:  388 QTC Calculation: 459 R Axis:   60  Text Interpretation: Sinus rhythm Confirmed by Abby Hocking [phone]) on 05/11/2024 10:33:03 AM  ____________________________________________  RADIOLOGY  No results found.  ____________________________________________   PROCEDURES  Procedure(s) performed:   Procedures   ____________________________________________   INITIAL IMPRESSION / ASSESSMENT AND PLAN / ED COURSE  Pertinent labs & imaging results that were available during my care of the patient were reviewed by me and considered in my medical  decision making (see chart for details).   This patient is Presenting for Evaluation of CP, which does require a range of treatment options, and is a complaint that involves a high risk of morbidity and mortality.  The Differential Diagnoses includes but is not exclusive to acute coronary syndrome, aortic dissection, pulmonary embolism, cardiac tamponade, community-acquired pneumonia, pericarditis, musculoskeletal chest wall pain, etc.   Critical Interventions-    Medications  sodium chloride  0.9 % bolus 1,000 mL (1,000 mLs Intravenous New Bag/Given 05/11/24 1051)    Reassessment after intervention:     I decided to review pertinent External Data, and in summary most recent ECHO show preserved EF after CAD.   Clinical Laboratory Tests Ordered, included ***  Radiologic Tests Ordered, included CXR. I independently interpreted the images and agree with radiology interpretation.   Cardiac Monitor Tracing which shows NSR.    Social Determinants of Health Risk patient is not an active smoker.   Consult complete with  Medical Decision Making: Summary:  Patient presents emergency department with fatigue and generalized weakness.  She has headache without red flag features.  Tightness in the chest, worse with movement but low suspicion for PE.  She has been compliant with her home medications.  Plan for blood work, IV fluids, chest x-ray and reassess.  Reevaluation with update and discussion with   ***Considered admission***  Patient's presentation is most consistent with acute presentation with potential threat to life or bodily function.   Disposition:   ____________________________________________  FINAL CLINICAL IMPRESSION(S) / ED DIAGNOSES  Final diagnoses:  None     NEW OUTPATIENT MEDICATIONS STARTED DURING THIS VISIT:  New Prescriptions   No medications on file    Note:  This document was prepared using Dragon voice recognition software and may include  unintentional dictation errors.  Abby Hocking, MD, [facility]

## 2024-05-11 NOTE — Progress Notes (Signed)
 Plan of Care Note for accepted transfer   Patient: Renee Bryant MRN: [id number]   DOA: 05/11/2024  Facility requesting transfer: [facility]. Requesting Provider: J Reason for transfer: CAP. Facility course:  43 year old female with a past medical history of CAD, NSTEMI, diabetes, hypertension, HIV, hyperlipidemia CKD, vitamin D  deficiency who presented with pleuritic chest pain, cough and dyspnea.  She received azithromycin  and ceftriaxone .  Imaging showed pneumonia.  Plan of care: The patient is accepted for admission to Telemetry unit, at [facility]..   Author: Danice Dural, MD 05/11/2024  Check [email or web address] for on-call coverage.  Nursing staff, Please call [facility] Admits & Consults System-Wide number on Amion as soon as patient's arrival, so appropriate admitting provider can evaluate the pt.

## 2024-05-11 NOTE — ED Notes (Signed)
 Pt spo2 91% on RA.  Placed pt on 2l [place] at this time.

## 2024-05-11 NOTE — H&P (Signed)
 History and Physical    Renee Bryant  ZOX:[id number]  DOB: 19-Aug-1981  DOA: 05/11/2024 PCP: Everlina Hock, NP   Patient coming from: Home  Chief Complaint: Severe generalized weakness  HPI: Renee Bryant is a 43 y.o. female with medical history of HIV, NSTEMI, asthma, coronary artery disease (NSTEMI in 8/24) 30-year history of tobacco abuse, currently not smoking, diabetes mellitus, chronic kidney disease stage IIIb, hypertension, HFpEF who had a headache and has been feeling extremely weak for 4 days now.  She states that she has been freezing but when she has checked her temperature, she was afebrile.  Her headache is throughout her head but more on the occipital area.  There is no neck stiffness, photophobia, light sensitivity or neurological symptoms associated with it.  She lost her appetite and has not been eating well for the past 4 days.  She feels so fatigued that she has trouble ambulating short distances and has had some subjective shortness of breath on exertion.  No wheezing, no complaints of cough, runny nose, sore throat, sinus drainage, or earache.  No complaints of nausea, abdominal pain, vomiting or diarrhea.  No new rash or tick bites that she knows of.  ED Course: PA lateral chest x-ray revealed a right upper lobe infiltrate.  Given ceftriaxone  and azithromycin .  Review of Systems:  All other systems reviewed and apart from HPI, are negative.  Past Medical History:  Diagnosis Date   Abscess    Asthma    CHF (congestive heart failure) (HCC)    COPD (chronic obstructive pulmonary disease) (HCC)    Coronary artery disease    Diabetes mellitus without complication (HCC)    Emphysema of lung (HCC)    HIV (human immunodeficiency virus infection) (HCC)    Hypertension    Pneumonia     Past Surgical History:  Procedure Laterality Date   CESAREAN SECTION     CHOLECYSTECTOMY     FOOT SURGERY Left    LEFT HEART CATH AND CORONARY ANGIOGRAPHY N/A 05/03/2023    Procedure: LEFT HEART CATH AND CORONARY ANGIOGRAPHY;  Surgeon: Swazil[place], Peter M, MD;  Location: [facility] INVASIVE CV LAB;  Service: Cardiovascular;  Laterality: N/A;   TUBAL LIGATION      Social History:   reports that she has quit smoking. Her smoking use included cigarettes. She started smoking about 30 years ago. She has a 30.7 pack-year smoking history. She has never used smokeless tobacco. She reports that she does not drink alcohol and does not use drugs.  Allergies  Allergen Reactions   Ozempic (0.25 Or 0.5 Mg-Dose) [Semaglutide(0.25 Or 0.5mg -Dos)] Nausea Only   Jardiance [Empagliflozin] Other (See Comments)    Yeast infections   Metformin And Related Other (See Comments)    Flu-like symptoms   Pantoprazole  Other (See Comments)    Made her feel terrible   Wellbutrin [Bupropion]     unknown   Trulicity [Dulaglutide] Rash    History reviewed. No pertinent family history.   Prior to Admission medications   Medication Sig Start Date End Date Taking? Authorizing Provider  albuterol  (VENTOLIN  HFA) 108 (90 Base) MCG/ACT inhaler Inhale 1-2 puffs into the lungs every 6 (six) hours as needed for wheezing or shortness of breath. 07/22/23   Paz, Jose E, MD  aspirin  81 MG chewable tablet Chew 1 tablet (81 mg total) by mouth daily. 05/03/23   Danford, Willis Harter, MD  atorvastatin  (LIPITOR ) 80 MG tablet Take 1 tablet (80 mg total) by mouth daily. 01/27/24  Everlina Hock, NP  bictegravir-emtricitabine -tenofovir  AF (BIKTARVY ) 50-200-25 MG TABS tablet Take by mouth daily.    [provider]  carvedilol  (COREG ) 3.125 MG tablet Take 1 tablet (3.125 mg total) by mouth 2 (two) times daily with a meal. 08/26/23   Wittenborn, Bernardo Bridgeman, NP  clonazePAM  (KLONOPIN ) 0.5 MG tablet Take 1 mg by mouth 2 (two) times daily as needed for anxiety.    [provider]  clopidogrel  (PLAVIX ) 75 MG tablet Take 1 tablet (75 mg total) by mouth daily. 09/04/23   O'NealCathay Clonts, MD  Continuous Glucose  Sensor (FREESTYLE LIBRE 3 SENSOR) MISC Use to check blood glucose continuously. 10/22/23   Everlina Hock, NP  Evolocumab  (REPATHA  SURECLICK) 140 MG/ML SOAJ Inject 140 mg into the skin every 14 (fourteen) days. Patient not taking: Reported on 03/13/2024 10/16/23   Harrold Lincoln, MD  furosemide  (LASIX ) 20 MG tablet Take 1 tablet (20 mg total) by mouth daily as needed for edema or fluid. 11/19/23   Everlina Hock, NP  insulin  glargine (LANTUS ) 100 UNIT/ML injection Inject 36 Units into the skin at bedtime.    [provider]  Insulin  Pen Needle 32G X 4 MM MISC Use 3 times a day with Humalog  Kwik pen 07/22/23   Ezell Hollow, MD  Insulin  Syringe-Needle U-100 (GNP INSULIN  SYRINGES 31GX5/16) 31G X 5/16 0.3 ML MISC Use to inject Lantus  once a day 07/22/23   Paz, Jose E, MD  lansoprazole  (PREVACID  SOLUTAB) 30 MG disintegrating tablet Take 1 tablet (30 mg total) by mouth daily at 12 noon. 03/31/24   Everlina Hock, NP  MOUNJARO 2.5 MG/0.5ML Pen Inject 2.5 mg into the skin once a week. 02/06/24   [provider]  nitroGLYCERIN  (NITROSTAT ) 0.4 MG SL tablet Place 1 tablet (0.4 mg total) under the tongue every 5 (five) minutes as needed for chest pain. Patient not taking: Reported on 11/19/2023 08/26/23 11/24/23  Morey Ar, NP  ranolazine  (RANEXA ) 500 MG 12 hr tablet Take 1 tablet (500 mg total) by mouth 2 (two) times daily. 09/04/23   O'NealCathay Clonts, MD  sacubitril -valsartan  (ENTRESTO ) 24-26 MG TAKE ONE TABLET BY MOUTH TWICE DAILY 01/07/24   O'Neal, Cathay Clonts, MD  valACYclovir (VALTREX) 500 MG tablet Take 500 mg by mouth 2 (two) times daily as needed (For cold sores).    [provider]  Vitamin D , Ergocalciferol , (DRISDOL ) 1.25 MG (50000 UNIT) CAPS capsule TAKE 1 CAPSULE ONCE WEEKLY 02/11/24   Everlina Hock, NP    Physical Exam: Wt Readings from Last 3 Encounters:  05/11/24 104.3 kg  03/31/24 103.5 kg  03/13/24 103.4 kg   Vitals:   05/11/24 1330 05/11/24  1335 05/11/24 1338 05/11/24 1535  BP:   (!) 100/54 103/61  Pulse: 91 88 87 84  Resp: 18 20 16 18   Temp:   98.1 F (36.7 C) 99.7 F (37.6 C)  TempSrc:   Oral   SpO2: 91% 91% 95% 99%  Weight:          Constitutional:  Calm & comfortable Eyes: PERRLA, lids and conjunctivae normal ENT:  Mucous membranes are moist.  Pharynx clear of exudate   Normal dentition.  Respiratory:  Clear to auscultation bilaterally  Normal respiratory effort.  Cardiovascular:  S1 & S2 heard, regular rate and rhythm No Murmurs Abdomen:  Non distended No tenderness, No masses Bowel sounds normal Extremities:  No clubbing / cyanosis No pedal edema  Skin:  No rashes, lesions or ulcers Neurologic:  AAO x 3 CN 2-12 grossly intact Sensation intact Strength 5/5 in all 4 extremities Psychiatric:  Normal Mood and affect    Labs on Admission: I have personally reviewed labs and imaging studies   EKG: Independently reviewed.  Normal sinus rhythm  Assessment/Plan Principal Problem: Hyponatremia with AKI-CKD stage IIIb - Possibly prerenal as she has had poor oral intake for the past 4 days - Baseline creatinine 1.8-1.9. - Creatinine now is 2.79 - Sodium 132 and chloride also low at 97 - She takes Entresto  at home and as needed Lasix  but has not taken Lasix  in over a week - Takes aspirin  daily but no excessive NSAID use - She has received 500 cc of normal saline but has not urinated much-will give her another 500 cc and then hold off on giving her further fluids -Hold Entresto  for now and if creatinine improved, it can be resumed tomorrow  Active Problems: Severe fatigue with shortness of breath on exertion, headache in a patient with HIV Right upper lobe infiltrate  -May have a viral syndrome versus bacterial pneumonia - right upper lobe infiltrate suggestive of pneumonia but patient has no cough or fever-she received ceftriaxone  and Azithromycin  in the emergency department- Check  procalcitonin and if low, hold off on giving further antibiotics - History of asthma-no symptoms of asthma flare at this time-only uses an albuterol  inhaler as needed - Check respiratory panel  HIV, controlled - She follows at [facility] - HIV viral load on 02/07/2024 was 76 and CD4 count was 408 - Continue Biktarvy   Type 2 diabetes mellitus with hyperglycemia, with long-term current use of insulin  - A1c improved from 13.1 (June 2024) to 7.8 (January 2025) - Continue Lantus  and NovoLog  - Hold Mounjaro   Coronary artery disease, HFpEF - Hold Entresto  until renal function improves - Continue carvedilol , Plavix  and aspirin  - She uses furosemide  as needed for severe pedal edema and has not needed it in over a week  Prior tobacco abuse - She smoked from age 88 to age 43 and before she quit in the fall 2024, she was smoking about 2 packs/day  Obesity class III Estimated body mass index is 36.02 kg/m as calculated from the following:   Height as of 03/31/24: 5' 7 (1.702 m).   Weight as of this encounter: 104.3 kg.  Medication reconciliation not yet completed  DVT prophylaxis: Lovenox  Code Status: Full code Consults called: None Admission status:  Level of care: Telemetry  Sedalia Dacosta MD [facility]    05/11/2024, 4:18 PM

## 2024-05-11 NOTE — ED Triage Notes (Signed)
 Pt reports sick x 4 days. Reports unable to get up or walk without gasping for air. Denies cough . Chills no fever. Denies NVD Central chest pain and HA

## 2024-05-11 NOTE — Plan of Care (Signed)

## 2024-05-12 DIAGNOSIS — Z79899 Other long term (current) drug therapy: Secondary | ICD-10-CM | POA: Diagnosis not present

## 2024-05-12 DIAGNOSIS — N179 Acute kidney failure, unspecified: Secondary | ICD-10-CM | POA: Diagnosis not present

## 2024-05-12 LAB — COMPREHENSIVE METABOLIC PANEL WITH GFR
ALT: 19 U/L (ref 0–44)
AST: 24 U/L (ref 15–41)
Albumin: 1.8 g/dL — ABNORMAL LOW (ref 3.5–5.0)
Alkaline Phosphatase: 57 U/L (ref 38–126)
Anion gap: 10 (ref 5–15)
BUN: 29 mg/dL — ABNORMAL HIGH (ref 6–20)
CO2: 18 mmol/L — ABNORMAL LOW (ref 22–32)
Calcium: 7.6 mg/dL — ABNORMAL LOW (ref 8.9–10.3)
Chloride: 105 mmol/L (ref 98–111)
Creatinine, Ser: 2.33 mg/dL — ABNORMAL HIGH (ref 0.44–1.00)
GFR, Estimated: 26 mL/min — ABNORMAL LOW (ref >60)
Glucose, Bld: 280 mg/dL — ABNORMAL HIGH (ref 70–99)
Potassium: 4.2 mmol/L (ref 3.5–5.1)
Sodium: 133 mmol/L — ABNORMAL LOW (ref 135–145)
Total Bilirubin: 0.5 mg/dL (ref 0.0–1.2)
Total Protein: 5.5 g/dL — ABNORMAL LOW (ref 6.5–8.1)

## 2024-05-12 LAB — CBC WITH DIFFERENTIAL/PLATELET
Abs Immature Granulocytes: 0.06 10*3/uL (ref 0.00–0.07)
Basophils Absolute: 0.1 10*3/uL (ref 0.0–0.1)
Basophils Relative: 0 %
Eosinophils Absolute: 0.1 10*3/uL (ref 0.0–0.5)
Eosinophils Relative: 1 %
HCT: 30.6 % — ABNORMAL LOW (ref 36.0–46.0)
Hemoglobin: 10 g/dL — ABNORMAL LOW (ref 12.0–15.0)
Immature Granulocytes: 1 %
Lymphocytes Relative: 10 %
Lymphs Abs: 1.2 10*3/uL (ref 0.7–4.0)
MCH: 32.1 pg (ref 26.0–34.0)
MCHC: 32.7 g/dL (ref 30.0–36.0)
MCV: 98.1 fL (ref 80.0–100.0)
Monocytes Absolute: 1.5 10*3/uL — ABNORMAL HIGH (ref 0.1–1.0)
Monocytes Relative: 12 %
Neutro Abs: 9.1 10*3/uL — ABNORMAL HIGH (ref 1.7–7.7)
Neutrophils Relative %: 76 %
Platelets: 238 10*3/uL (ref 150–400)
RBC: 3.12 MIL/uL — ABNORMAL LOW (ref 3.87–5.11)
RDW: 11.9 % (ref 11.5–15.5)
WBC: 11.9 10*3/uL — ABNORMAL HIGH (ref 4.0–10.5)
nRBC: 0 % (ref 0.0–0.2)

## 2024-05-12 LAB — GLUCOSE, CAPILLARY
Glucose-Capillary: 399 mg/dL — ABNORMAL HIGH (ref 70–99)
Glucose-Capillary: 135 mg/dL — ABNORMAL HIGH (ref 70–99)
Glucose-Capillary: 212 mg/dL — ABNORMAL HIGH (ref 70–99)

## 2024-05-12 LAB — MAGNESIUM: Magnesium: 1.9 mg/dL (ref 1.7–2.4)

## 2024-05-12 MED ORDER — CLOPIDOGREL BISULFATE 75 MG PO TABS
75.0000 mg | ORAL_TABLET | Freq: Every day | ORAL | Status: DC
  Administered 2024-05-12 – 2024-05-13 (×2): 75 mg via ORAL
  Filled 2024-05-12 (×2): qty 1

## 2024-05-12 MED ORDER — ATORVASTATIN CALCIUM 40 MG PO TABS
80.0000 mg | ORAL_TABLET | Freq: Every day | ORAL | Status: DC
  Administered 2024-05-12 – 2024-05-13 (×2): 80 mg via ORAL
  Filled 2024-05-12 (×2): qty 2

## 2024-05-12 MED ORDER — SODIUM CHLORIDE 0.9 % IV SOLN: INTRAVENOUS | Status: AC

## 2024-05-12 MED ORDER — INSULIN ASPART 100 UNIT/ML IJ SOLN
0.0000 [IU] | Freq: Three times a day (TID) | INTRAMUSCULAR | Status: DC
  Administered 2024-05-12: 20 [IU] via SUBCUTANEOUS

## 2024-05-12 MED ORDER — SODIUM CHLORIDE 0.9 % IV SOLN
500.0000 mg | INTRAVENOUS | Status: DC
  Administered 2024-05-12: 500 mg via INTRAVENOUS
  Filled 2024-05-12 (×2): qty 5

## 2024-05-12 MED ORDER — SODIUM CHLORIDE 0.9 % IV SOLN
2.0000 g | INTRAVENOUS | Status: DC
  Administered 2024-05-12 – 2024-05-13 (×2): 2 g via INTRAVENOUS
  Filled 2024-05-12 (×2): qty 20

## 2024-05-12 MED ORDER — INSULIN ASPART 100 UNIT/ML IJ SOLN: 0.0000 [IU] | Freq: Every day | INTRAMUSCULAR | Status: DC

## 2024-05-12 MED ORDER — CLONAZEPAM 1 MG PO TABS
1.0000 mg | ORAL_TABLET | Freq: Two times a day (BID) | ORAL | Status: DC
  Administered 2024-05-12 – 2024-05-13 (×3): 1 mg via ORAL
  Filled 2024-05-12 (×3): qty 1

## 2024-05-12 MED ORDER — TIRZEPATIDE 5 MG/0.5ML ~~LOC~~ SOAJ
5.0000 mg | SUBCUTANEOUS | Status: DC
  Administered 2024-05-12: 5 mg via SUBCUTANEOUS

## 2024-05-12 MED ORDER — INSULIN GLARGINE-YFGN 100 UNIT/ML ~~LOC~~ SOLN
30.0000 [IU] | Freq: Every day | SUBCUTANEOUS | Status: DC
  Administered 2024-05-12: 30 [IU] via SUBCUTANEOUS
  Filled 2024-05-12 (×2): qty 0.3

## 2024-05-12 MED ORDER — FAMOTIDINE 20 MG PO TABS
20.0000 mg | ORAL_TABLET | Freq: Two times a day (BID) | ORAL | Status: DC
  Administered 2024-05-12 – 2024-05-13 (×2): 20 mg via ORAL
  Filled 2024-05-12 (×2): qty 1

## 2024-05-12 MED ORDER — TRAMADOL HCL 50 MG PO TABS: 50.0000 mg | ORAL_TABLET | Freq: Four times a day (QID) | ORAL | Status: DC | PRN

## 2024-05-12 MED ORDER — NITROGLYCERIN 0.4 MG SL SUBL: 0.4000 mg | SUBLINGUAL_TABLET | SUBLINGUAL | Status: DC | PRN

## 2024-05-12 MED ORDER — ALBUTEROL SULFATE (2.5 MG/3ML) 0.083% IN NEBU: 2.5000 mg | INHALATION_SOLUTION | RESPIRATORY_TRACT | Status: DC | PRN

## 2024-05-12 MED ORDER — CARVEDILOL 3.125 MG PO TABS
3.1250 mg | ORAL_TABLET | Freq: Two times a day (BID) | ORAL | Status: DC
  Administered 2024-05-12 – 2024-05-13 (×2): 3.125 mg via ORAL
  Filled 2024-05-12 (×2): qty 1

## 2024-05-12 MED ORDER — ASPIRIN 81 MG PO CHEW
81.0000 mg | CHEWABLE_TABLET | Freq: Every day | ORAL | Status: DC
  Administered 2024-05-12 – 2024-05-13 (×2): 81 mg via ORAL
  Filled 2024-05-12 (×2): qty 1

## 2024-05-12 MED ORDER — BICTEGRAVIR-EMTRICITAB-TENOFOV 50-200-25 MG PO TABS
1.0000 | ORAL_TABLET | Freq: Every day | ORAL | Status: DC
  Administered 2024-05-12: 1 via ORAL
  Filled 2024-05-12: qty 1

## 2024-05-12 MED ORDER — RANOLAZINE ER 500 MG PO TB12
500.0000 mg | ORAL_TABLET | Freq: Two times a day (BID) | ORAL | Status: DC
  Administered 2024-05-12 – 2024-05-13 (×3): 500 mg via ORAL
  Filled 2024-05-12 (×3): qty 1

## 2024-05-12 NOTE — Progress Notes (Addendum)
 PROGRESS NOTE    Renee Bryant  ZOX:[id number] DOB: 11/14/1981 DOA: 05/11/2024 PCP: Everlina Hock, NP   Brief Narrative:  43 year old female with history of HIV on antiretrovirals, asthma, CAD with non-STEMI in 06/2023, tobacco abuse, no smoking, diabetes mellitus type 2, chronic kidney disease stage IIIb, hypertension, chronic systolic heart failure presented with severe generalized weakness, poor oral intake and fatigue.  On presentation, she was found to have AKI on CKD stage IIIb with possible right upper lobe infiltrate.  She was started on IV fluids and antibiotics.  Assessment & Plan:   Possible right upper lobar community-acquired bacterial pneumonia History of asthma -Presented with generalized weakness, dehydration, possible pneumonia.  Received Rocephin  and Zithromax  on presentation.  Procalcitonin only slightly elevated.  Continue Rocephin  and Zithromax .  Currently on room air.  COVID/influenza/RSV PCR and respiratory panel PCR negative.  Blood cultures negative so far. - Apparently only uses albuterol  as needed at home.  AKI on CKD stage IIIb -Baseline creatinine of 1.5-1.9.  Creatinine 2.79 on presentation.  Treated with some IV fluids on presentation.  Labs pending for today.  Might need some more gentle hydration if creatinine still elevated.  Monitor.  HIV, controlled - Follows up with [facility].  Viral load on 02/07/2024 was 76 and CD4 count was 408.  Continue Biktarvy   CAD Chronic systolic heart failure - Most recent echo on 03/30/2024 showed improvement of EF to 55 to 60%. - Currently chest pain-free and euvolemic.  Continue Coreg , ranolazine , aspirin , statin, Plavix .  Entresto  on hold for now.  Has not used as needed Lasix  for more than a week prior to presentation. - Strict input and output.  Daily weights.  Outpatient follow-up with cardiology  Leukocytosis - Improving.  Monitor  Hyponatremia - Mild.  Monitor.  Anemia of chronic disease - From chronic illnesses.   Hemoglobin stable.  Monitor intermittently  Diabetes mellitus type 2 with hyperglycemia - Resume long-acting insulin  at a lower dose.  CBGs with SSI.  She is supposed to take Mounjaro today as per the patient: Okay to take home Mounjaro dose. Carb modified diet  Prior tobacco use -Apparently smoked from age [age] before she quit in the fall of 2024; she was smoking about 2 packs/day  Obesity class II - Outpatient follow-up  Generalized weakness -PT eval   DVT prophylaxis: Lovenox  Code Status: Full Family Communication: None at bedside Disposition Plan: Status is: Inpatient Remains inpatient appropriate because: Of severity of illness  Consultants: None  Procedures: None  Antimicrobials:  Anti-infectives (From admission, onward)    Start     Dose/Rate Route Frequency Ordered Stop   05/12/24 2200  bictegravir-emtricitabine -tenofovir  AF (BIKTARVY ) 50-200-25 MG per tablet 1 tablet        1 tablet Oral Daily at bedtime 05/12/24 0825     05/12/24 1000  cefTRIAXone  (ROCEPHIN ) 2 g in sodium chloride  0.9 % 100 mL IVPB        2 g 200 mL/hr over 30 Minutes Intravenous Every 24 hours 05/12/24 0827     05/12/24 1000  azithromycin  (ZITHROMAX ) 500 mg in sodium chloride  0.9 % 250 mL IVPB        500 mg 250 mL/hr over 60 Minutes Intravenous Every 24 hours 05/12/24 0827     05/11/24 1300  cefTRIAXone  (ROCEPHIN ) 1 g in sodium chloride  0.9 % 100 mL IVPB        1 g 200 mL/hr over 30 Minutes Intravenous  Once 05/11/24 1247 05/11/24 1402   05/11/24 1215  cefTRIAXone  (  ROCEPHIN ) 1 g in sodium chloride  0.9 % 100 mL IVPB        1 g 200 mL/hr over 30 Minutes Intravenous  Once 05/11/24 1201 05/11/24 1311   05/11/24 1215  azithromycin  (ZITHROMAX ) 500 mg in sodium chloride  0.9 % 250 mL IVPB        500 mg 250 mL/hr over 60 Minutes Intravenous  Once 05/11/24 1201 05/11/24 1329        Subjective: Patient seen and examined at bedside.  Still feels weak but slightly better with slightly improving  appetite.  Had fever last night.  Denies any chest pain or vomiting. Objective: Vitals:   05/11/24 1731 05/11/24 1938 05/11/24 2330 05/12/24 0441  BP:  126/64 (!) 136/92 (!) 133/91  Pulse: (!) 101 83 93 76  Resp:  18 18 17   Temp:  99.2 F (37.3 C) (!) 100.4 F (38 C) 98.2 F (36.8 C)  TempSrc:      SpO2: 99% 95% 95% 97%  Weight:        Intake/Output Summary (Last 24 hours) at 05/12/2024 0827 Last data filed at 05/11/2024 1402 Gross per 24 hour  Intake 1600 ml  Output --  Net 1600 ml   Filed Weights   05/11/24 1031  Weight: 104.3 kg    Examination:  General exam: Appears calm and comfortable.  On room air. Respiratory system: Bilateral decreased breath sounds at bases with some scattered crackles Cardiovascular system: S1 & S2 heard, Rate controlled currently Gastrointestinal system: Abdomen is obese, nondistended, soft and nontender. Normal bowel sounds heard. Extremities: No cyanosis, clubbing, edema  Central nervous system: Alert and oriented. No focal neurological deficits. Moving extremities Skin: No rashes, lesions or ulcers Psychiatry: Judgement and insight appear normal. Mood & affect appropriate.     Data Reviewed: I have personally reviewed following labs and imaging studies  CBC: Recent Labs  Lab 05/11/24 1030 05/11/24 1643  WBC 13.2* 13.8*  HGB 11.1* 10.2*  HCT 32.3* 31.1*  MCV 95.3 100.6*  PLT 256 220   Basic Metabolic Panel: Recent Labs  Lab 05/11/24 1030 05/11/24 1643  NA 132*  --   K 4.6  --   CL 97*  --   CO2 22  --   GLUCOSE 264*  --   BUN 28*  --   CREATININE 2.79* 2.77*  CALCIUM  8.2*  --    GFR: Estimated Creatinine Clearance: 33.2 mL/min (A) (by C-G formula based on SCr of 2.77 mg/dL (H)). Liver Function Tests: Recent Labs  Lab 05/11/24 1041  AST 21  ALT 17  ALKPHOS 68  BILITOT 0.3  PROT 5.9*  ALBUMIN 2.5*   Recent Labs  Lab 05/11/24 1041  LIPASE 31   No results for input(s): AMMONIA in the last 168  hours. Coagulation Profile: Recent Labs  Lab 05/11/24 1030  INR 1.2   Cardiac Enzymes: No results for input(s): CKTOTAL, CKMB, CKMBINDEX, TROPONINI in the last 168 hours. BNP (last 3 results) Recent Labs    08/19/23 1330  PROBNP 76.0   HbA1C: No results for input(s): HGBA1C in the last 72 hours. CBG: No results for input(s): GLUCAP in the last 168 hours. Lipid Profile: No results for input(s): CHOL, HDL, LDLCALC, TRIG, CHOLHDL, LDLDIRECT in the last 72 hours. Thyroid  Function Tests: No results for input(s): TSH, T4TOTAL, FREET4, T3FREE, THYROIDAB in the last 72 hours. Anemia Panel: No results for input(s): VITAMINB12, FOLATE, FERRITIN, TIBC, IRON, RETICCTPCT in the last 72 hours. Sepsis Labs: Recent Labs  Lab 05/11/24  1643  PROCALCITON 0.16    Recent Results (from the past 240 hours)  Culture, blood (routine x 2)     Status: None (Preliminary result)   Collection Time: 05/11/24 12:02 PM   Specimen: BLOOD  Result Value Ref Range Status   Specimen Description   Final    BLOOD LEFT ANTECUBITAL Performed at [facility], [address] [place], [place] [address]    Special Requests   Final    BOTTLES DRAWN AEROBIC AND ANAEROBIC Blood Culture adequate volume Performed at [facility], [address] [place], [place] [address]    Culture   Final    NO GROWTH < 24 HOURS Performed at [facility], [address] [place], [place] [address]    Report Status PENDING  Incomplete  Culture, blood (routine x 2)     Status: None (Preliminary result)   Collection Time: 05/11/24 12:07 PM   Specimen: BLOOD  Result Value Ref Range Status   Specimen Description   Final    BLOOD RIGHT ANTECUBITAL Performed at [facility], [address] [place], [place] [address]    Special Requests   Final    BOTTLES DRAWN AEROBIC AND ANAEROBIC Blood Culture adequate volume Performed at [facility], [address] [place], [place] [address]    Culture   Final    NO GROWTH < 24 HOURS Performed at [facility], [address], [place], [place] [address]    Report Status PENDING  Incomplete  Resp panel by RT-PCR (RSV, Flu A&B, Covid) Anterior Nasal Swab     Status: None   Collection Time: 05/11/24  4:52 PM   Specimen: Anterior Nasal Swab  Result Value Ref Range Status   SARS Coronavirus 2 by RT PCR NEGATIVE NEGATIVE Final    Comment: (NOTE) SARS-CoV-2 target nucleic acids are NOT DETECTED.  The SARS-CoV-2 RNA is generally detectable in upper respiratory specimens during the acute phase of infection. The lowest concentration of SARS-CoV-2 viral copies this assay can detect is 138 copies/mL. A negative result does not preclude SARS-Cov-2 infection and should not be used as the sole basis for treatment or other patient management decisions. A negative result may occur with  improper specimen collection/handling, submission of specimen other than nasopharyngeal swab, presence of viral mutation(s) within the areas targeted by this assay, and inadequate number of viral copies(<138 copies/mL). A negative result must be combined with clinical observations, patient history, and epidemiological information. The expected result is Negative.  Fact Sheet for Patients:  BloggerCourse.com  Fact Sheet for Healthcare Providers:  SeriousBroker.it  This test is no t yet approved or cleared by the [place]  FDA and  has been authorized for detection and/or diagnosis of SARS-CoV-2 by FDA under an Emergency Use Authorization (EUA). This EUA will remain  in effect (meaning this test can be used) for the duration of the COVID-19 declaration under Section 564(b)(1) of the Act, 21 U.S.C.section 360bbb-3(b)(1), unless the authorization is terminated  or revoked sooner.       Influenza A by PCR NEGATIVE NEGATIVE Final   Influenza  B by PCR NEGATIVE NEGATIVE Final    Comment: (NOTE) The Xpert Xpress SARS-CoV-2/FLU/RSV plus assay is intended as an aid in the diagnosis of influenza from Nasopharyngeal swab specimens and should not be used as a sole basis for treatment. Nasal washings and aspirates are unacceptable for Xpert Xpress SARS-CoV-2/FLU/RSV testing.  Fact  Sheet for Patients: BloggerCourse.com  Fact Sheet for Healthcare Providers: SeriousBroker.it  This test is not yet approved or cleared by the [place]  FDA and has been authorized for detection and/or diagnosis of SARS-CoV-2 by FDA under an Emergency Use Authorization (EUA). This EUA will remain in effect (meaning this test can be used) for the duration of the COVID-19 declaration under Section 564(b)(1) of the Act, 21 U.S.C. section 360bbb-3(b)(1), unless the authorization is terminated or revoked.     Resp Syncytial Virus by PCR NEGATIVE NEGATIVE Final    Comment: (NOTE) Fact Sheet for Patients: BloggerCourse.com  Fact Sheet for Healthcare Providers: SeriousBroker.it  This test is not yet approved or cleared by the [place]  FDA and has been authorized for detection and/or diagnosis of SARS-CoV-2 by FDA under an Emergency Use Authorization (EUA). This EUA will remain in effect (meaning this test can be used) for the duration of the COVID-19 declaration under Section 564(b)(1) of the Act, 21 U.S.C. section 360bbb-3(b)(1), unless the authorization is terminated or revoked.  Performed at [facility], [address], [place], [place] [address]   Respiratory (~20 pathogens) panel by PCR     Status: None   Collection Time: 05/11/24  4:52 PM   Specimen: Nasopharyngeal Swab; Respiratory  Result Value Ref Range Status   Adenovirus NOT DETECTED NOT DETECTED Final   Coronavirus 229E NOT DETECTED NOT DETECTED Final     Comment: (NOTE) The Coronavirus on the Respiratory Panel, DOES NOT test for the novel  Coronavirus (2019 nCoV)    Coronavirus HKU1 NOT DETECTED NOT DETECTED Final   Coronavirus NL63 NOT DETECTED NOT DETECTED Final   Coronavirus OC43 NOT DETECTED NOT DETECTED Final   Metapneumovirus NOT DETECTED NOT DETECTED Final   Rhinovirus / Enterovirus NOT DETECTED NOT DETECTED Final   Influenza A NOT DETECTED NOT DETECTED Final   Influenza B NOT DETECTED NOT DETECTED Final   Parainfluenza Virus 1 NOT DETECTED NOT DETECTED Final   Parainfluenza Virus 2 NOT DETECTED NOT DETECTED Final   Parainfluenza Virus 3 NOT DETECTED NOT DETECTED Final   Parainfluenza Virus 4 NOT DETECTED NOT DETECTED Final   Respiratory Syncytial Virus NOT DETECTED NOT DETECTED Final   Bordetella pertussis NOT DETECTED NOT DETECTED Final   Bordetella Parapertussis NOT DETECTED NOT DETECTED Final   Chlamydophila pneumoniae NOT DETECTED NOT DETECTED Final   Mycoplasma pneumoniae NOT DETECTED NOT DETECTED Final    Comment: Performed at [facility], [address], [place], [place] [address]         Radiology Studies: DG Chest 2 View Result Date: 05/11/2024 CLINICAL DATA:  Chest pain with headache shortness of breath and chills. EXAM: CHEST - 2 VIEW COMPARISON:  10/21/2023 FINDINGS: Interval development of focal airspace disease in the posterior right upper lobe. Left lung clear. The cardiopericardial silhouette is within normal limits for size. No pleural effusion. No acute bony abnormality. Telemetry leads overlie the chest. IMPRESSION: Interval development of focal airspace disease in the posterior right upper lobe. Imaging features compatible with pneumonia. Follow-up recommended to ensure resolution. Electronically Signed   By: Donnal Fusi M.D.   On: 05/11/2024 11:25        Scheduled Meds:  aspirin   81 mg Oral Daily   atorvastatin   80 mg Oral Daily   bictegravir-emtricitabine -tenofovir  AF  1 tablet Oral QHS    carvedilol   3.125 mg Oral BID WC   clonazePAM   1 mg Oral BID   clopidogrel   75 mg Oral Daily  enoxaparin  (LOVENOX ) injection  40 mg Subcutaneous QHS   famotidine  20 mg Oral BID AC   ranolazine   500 mg Oral BID   Continuous Infusions:  azithromycin      cefTRIAXone  (ROCEPHIN )  IV            Audria Leather, MD [facility] 05/12/2024, 8:27 AM

## 2024-05-12 NOTE — Progress Notes (Signed)
   05/12/24 1029  TOC Brief Assessment  Insurance and Status Reviewed  Patient has primary care physician Yes  Home environment has been reviewed apartment  Prior level of function: Independent  Prior/Current Home Services No current home services  Social Drivers of Health Review SDOH reviewed no interventions necessary  Readmission risk has been reviewed Yes  Transition of care needs no transition of care needs at this time

## 2024-05-12 NOTE — Evaluation (Signed)
 Physical Therapy Evaluation Patient Details Name: Renee Bryant MRN: [id number] DOB: 1981-01-29 Today's Date: 05/12/2024  History of Present Illness  Pt is 43 yo presented on 05/11/24 with weakness and poor intake.  Pt found to have AKI on CKD with possible R upper lobe CAP.  Pt with hx including but not limited to HIV on antiretrovirals, asthma, CAD, NSTEMI, DM2, CKD, HTN, CHF  Clinical Impression  Pt admitted with above diagnosis.  At baseline, pt independent and working as Lawyer.  Today, pt reports feeling much better than at admission.  She has been mobilizing in room independently and demonstrated safe mobility with therapy -ambulating 300' in hallway.  Pt near baseline and independent.  No further acute PT needs.         If plan is discharge home, recommend the following:     Can travel by private vehicle        Equipment Recommendations None recommended by PT  Recommendations for Other Services       Functional Status Assessment Patient has not had a recent decline in their functional status     Precautions / Restrictions Precautions Precautions: None      Mobility  Bed Mobility Overal bed mobility: Independent                  Transfers Overall transfer level: Independent                      Ambulation/Gait Ambulation/Gait assistance: Independent       Gait velocity: normal     General Gait Details: Pt has been ambulating in room independently.  She was able to ambulate 300' in hallway and navigate her IV pole.  Stairs            Wheelchair Mobility     Tilt Bed    Modified Rankin (Stroke Patients Only)       Balance Overall balance assessment: Independent                                           Pertinent Vitals/Pain Pain Assessment Pain Assessment: No/denies pain    Home Living Family/patient expects to be discharged to:: Private residence Living Arrangements: Other (Comment) Available Help at  Discharge: Family;Available PRN/intermittently Type of Home: House         Home Layout: One level Home Equipment: None      Prior Function Prior Level of Function : Independent/Modified Independent;Working/employed;Driving               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)     Extremity/Trunk Assessment   Upper Extremity Assessment Upper Extremity Assessment: Overall WFL for tasks assessed    Lower Extremity Assessment Lower Extremity Assessment: Overall WFL for tasks assessed    Cervical / Trunk Assessment Cervical / Trunk Assessment: Normal  Communication        Cognition Arousal: Alert Behavior During Therapy: WFL for tasks assessed/performed   PT - Cognitive impairments: No apparent impairments                                 Cueing       General Comments General comments (skin integrity, edema, etc.): VSS    Exercises     Assessment/Plan    PT Assessment Patient  does not need any further PT services  PT Problem List         PT Treatment Interventions      PT Goals (Current goals can be found in the Care Plan section)  Acute Rehab PT Goals Patient Stated Goal: return home hopefully tomorrow PT Goal Formulation: All assessment and education complete, DC therapy    Frequency       Co-evaluation               AM-PAC PT 6 Clicks Mobility  Outcome Measure Help needed turning from your back to your side while in a flat bed without using bedrails?: None Help needed moving from lying on your back to sitting on the side of a flat bed without using bedrails?: None Help needed moving to and from a bed to a chair (including a wheelchair)?: None Help needed standing up from a chair using your arms (e.g., wheelchair or bedside chair)?: None Help needed to walk in hospital room?: None Help needed climbing 3-5 steps with a railing? : None 6 Click Score: 24    End of Session   Activity Tolerance: Patient tolerated  treatment well Patient left: in bed;with call bell/phone within reach;with family/visitor present Nurse Communication: Mobility status      Time: 1341-1351 PT Time Calculation (min) (ACUTE ONLY): 10 min   Charges:   PT Evaluation $PT Eval Low Complexity: 1 Low   PT General Charges $$ ACUTE PT VISIT: 1 Visit         Cyd Dowse, PT Acute Rehab Services [facility] [phone]   Carolynn Citrin 05/12/2024, 1:55 PM

## 2024-05-13 DIAGNOSIS — N179 Acute kidney failure, unspecified: Secondary | ICD-10-CM | POA: Diagnosis not present

## 2024-05-13 LAB — COMPREHENSIVE METABOLIC PANEL WITH GFR
ALT: 22 U/L (ref 0–44)
AST: 23 U/L (ref 15–41)
Albumin: 1.7 g/dL — ABNORMAL LOW (ref 3.5–5.0)
Alkaline Phosphatase: 51 U/L (ref 38–126)
Anion gap: 6 (ref 5–15)
BUN: 28 mg/dL — ABNORMAL HIGH (ref 6–20)
CO2: 22 mmol/L (ref 22–32)
Calcium: 7.7 mg/dL — ABNORMAL LOW (ref 8.9–10.3)
Chloride: 104 mmol/L (ref 98–111)
Creatinine, Ser: 2.08 mg/dL — ABNORMAL HIGH (ref 0.44–1.00)
GFR, Estimated: 30 mL/min — ABNORMAL LOW (ref >60)
Glucose, Bld: 179 mg/dL — ABNORMAL HIGH (ref 70–99)
Potassium: 4.1 mmol/L (ref 3.5–5.1)
Sodium: 132 mmol/L — ABNORMAL LOW (ref 135–145)
Total Bilirubin: 0.3 mg/dL (ref 0.0–1.2)
Total Protein: 5.4 g/dL — ABNORMAL LOW (ref 6.5–8.1)

## 2024-05-13 LAB — CBC WITH DIFFERENTIAL/PLATELET
Abs Immature Granulocytes: 0.04 10*3/uL (ref 0.00–0.07)
Basophils Absolute: 0.1 10*3/uL (ref 0.0–0.1)
Basophils Relative: 1 %
Eosinophils Absolute: 0.2 10*3/uL (ref 0.0–0.5)
Eosinophils Relative: 2 %
HCT: 30 % — ABNORMAL LOW (ref 36.0–46.0)
Hemoglobin: 9.8 g/dL — ABNORMAL LOW (ref 12.0–15.0)
Immature Granulocytes: 0 %
Lymphocytes Relative: 16 %
Lymphs Abs: 1.5 10*3/uL (ref 0.7–4.0)
MCH: 32.7 pg (ref 26.0–34.0)
MCHC: 32.7 g/dL (ref 30.0–36.0)
MCV: 100 fL (ref 80.0–100.0)
Monocytes Absolute: 1.5 10*3/uL — ABNORMAL HIGH (ref 0.1–1.0)
Monocytes Relative: 16 %
Neutro Abs: 6.2 10*3/uL (ref 1.7–7.7)
Neutrophils Relative %: 65 %
Platelets: 230 10*3/uL (ref 150–400)
RBC: 3 MIL/uL — ABNORMAL LOW (ref 3.87–5.11)
RDW: 11.9 % (ref 11.5–15.5)
WBC: 9.5 10*3/uL (ref 4.0–10.5)
nRBC: 0 % (ref 0.0–0.2)

## 2024-05-13 LAB — HEMOGLOBIN A1C
Hgb A1c MFr Bld: 8 % — ABNORMAL HIGH (ref 4.8–5.6)
Mean Plasma Glucose: 182.9 mg/dL

## 2024-05-13 LAB — GLUCOSE, CAPILLARY
Glucose-Capillary: 167 mg/dL — ABNORMAL HIGH (ref 70–99)
Glucose-Capillary: 211 mg/dL — ABNORMAL HIGH (ref 70–99)

## 2024-05-13 LAB — MAGNESIUM: Magnesium: 2 mg/dL (ref 1.7–2.4)

## 2024-05-13 LAB — PROCALCITONIN: Procalcitonin: 0.13 ng/mL

## 2024-05-13 MED ORDER — AZITHROMYCIN 250 MG PO TABS
500.0000 mg | ORAL_TABLET | Freq: Every day | ORAL | Status: DC
  Administered 2024-05-13: 500 mg via ORAL
  Filled 2024-05-13: qty 2

## 2024-05-13 MED ORDER — SACUBITRIL-VALSARTAN 24-26 MG PO TABS
1.0000 | ORAL_TABLET | Freq: Two times a day (BID) | ORAL | Status: DC
  Filled 2024-05-13: qty 1

## 2024-05-13 MED ORDER — CEFDINIR 300 MG PO CAPS: 300.0000 mg | ORAL_CAPSULE | Freq: Two times a day (BID) | ORAL | 0 refills | Status: AC

## 2024-05-13 MED ORDER — AZITHROMYCIN 500 MG PO TABS: 500.0000 mg | ORAL_TABLET | Freq: Every day | ORAL | 0 refills | Status: AC

## 2024-05-13 NOTE — Progress Notes (Incomplete)
 PROGRESS NOTE    Renee Bryant  CZY:[id number] DOB: 03-06-1981 DOA: 05/11/2024 PCP: Everlina Hock, NP   Brief Narrative: No notes on file   Assessment and Plan:  Right  upper lobe pneumonia ***  Fever ***  AKI on CKD stage IIIb ***  HIV ***  CAD ***  Chronic HFrecEF*** ***  Hyponatremia ***  Anemia of chronic disease ***  Diabetes mellitus, type 2 ***  History of tobacco use ***  Obesity, class II Estimated body mass index is 40.41 kg/m as calculated from the following:   Height as of 03/31/24: 5' 7 (1.702 m).   Weight as of this encounter: 117 kg.   DVT prophylaxis: *** Code Status:   Code Status: Full Code Family Communication: *** Disposition Plan: ***   Consultants:  ***  Procedures:  ***  Antimicrobials: ***    Subjective: ***  Objective: BP (!) 156/89 (BP Location: Left Arm)   Pulse 96   Temp 99.2 F (37.3 C) (Oral)   Resp 17   Wt 117 kg   SpO2 100%   BMI 40.41 kg/m   Examination:  General exam: Appears calm and comfortable *** Respiratory system: Clear to auscultation. Respiratory effort normal. Cardiovascular system: S1 & S2 heard, RRR. No murmurs, rubs, gallops or clicks. Gastrointestinal system: Abdomen is nondistended, soft and nontender. No organomegaly or masses felt. Normal bowel sounds heard. Central nervous system: Alert and oriented. No focal neurological deficits. Musculoskeletal: No edema. No calf tenderness Skin: No cyanosis. No rashes Psychiatry: Judgement and insight appear normal. Mood & affect appropriate.    Data Reviewed: I have personally reviewed following labs and imaging studies  CBC Lab Results  Component Value Date   WBC 9.5 05/13/2024   RBC 3.00 (L) 05/13/2024   HGB 9.8 (L) 05/13/2024   HCT 30.0 (L) 05/13/2024   MCV 100.0 05/13/2024   MCH 32.7 05/13/2024   PLT 230 05/13/2024   MCHC 32.7 05/13/2024   RDW 11.9 05/13/2024   LYMPHSABS 1.5 05/13/2024   MONOABS 1.5 (H) 05/13/2024    EOSABS 0.2 05/13/2024   BASOSABS 0.1 05/13/2024     Last metabolic panel Lab Results  Component Value Date   NA 132 (L) 05/13/2024   K 4.1 05/13/2024   CL 104 05/13/2024   CO2 22 05/13/2024   BUN 28 (H) 05/13/2024   CREATININE 2.08 (H) 05/13/2024   GLUCOSE 179 (H) 05/13/2024   GFRNONAA 30 (L) 05/13/2024   GFRAA >60 05/05/2019   CALCIUM  7.7 (L) 05/13/2024   PROT 5.4 (L) 05/13/2024   ALBUMIN 1.7 (L) 05/13/2024   BILITOT 0.3 05/13/2024   ALKPHOS 51 05/13/2024   AST 23 05/13/2024   ALT 22 05/13/2024   ANIONGAP 6 05/13/2024    GFR: Estimated Creatinine Clearance: 47.1 mL/min (A) (by C-G formula based on SCr of 2.08 mg/dL (H)).  Recent Results (from the past 240 hours)  Culture, blood (routine x 2)     Status: None (Preliminary result)   Collection Time: 05/11/24 12:02 PM   Specimen: BLOOD  Result Value Ref Range Status   Specimen Description   Final    BLOOD LEFT ANTECUBITAL Performed at [facility], [address] [place], [place] [address]    Special Requests   Final    BOTTLES DRAWN AEROBIC AND ANAEROBIC Blood Culture adequate volume Performed at [facility], [address] [place], [place] [address]    Culture   Final    NO GROWTH  2 DAYS Performed at [facility], [address], [place], [place] [address]    Report Status PENDING  Incomplete  Culture, blood (routine x 2)     Status: None (Preliminary result)   Collection Time: 05/11/24 12:07 PM   Specimen: BLOOD  Result Value Ref Range Status   Specimen Description   Final    BLOOD RIGHT ANTECUBITAL Performed at [facility], [address] [place], [place] [address]    Special Requests   Final    BOTTLES DRAWN AEROBIC AND ANAEROBIC Blood Culture adequate volume Performed at [facility], [address] [place], [place] [address]    Culture   Final    NO GROWTH 2 DAYS Performed at [facility], [address], [place], [place] [address]     Report Status PENDING  Incomplete  Resp panel by RT-PCR (RSV, Flu A&B, Covid) Anterior Nasal Swab     Status: None   Collection Time: 05/11/24  4:52 PM   Specimen: Anterior Nasal Swab  Result Value Ref Range Status   SARS Coronavirus 2 by RT PCR NEGATIVE NEGATIVE Final    Comment: (NOTE) SARS-CoV-2 target nucleic acids are NOT DETECTED.  The SARS-CoV-2 RNA is generally detectable in upper respiratory specimens during the acute phase of infection. The lowest concentration of SARS-CoV-2 viral copies this assay can detect is 138 copies/mL. A negative result does not preclude SARS-Cov-2 infection and should not be used as the sole basis for treatment or other patient management decisions. A negative result may occur with  improper specimen collection/handling, submission of specimen other than nasopharyngeal swab, presence of viral mutation(s) within the areas targeted by this assay, and inadequate number of viral copies(<138 copies/mL). A negative result must be combined with clinical observations, patient history, and epidemiological information. The expected result is Negative.  Fact Sheet for Patients:  BloggerCourse.com  Fact Sheet for Healthcare Providers:  SeriousBroker.it  This test is no t yet approved or cleared by the [place]  FDA and  has been authorized for detection and/or diagnosis of SARS-CoV-2 by FDA under an Emergency Use Authorization (EUA). This EUA will remain  in effect (meaning this test can be used) for the duration of the COVID-19 declaration under Section 564(b)(1) of the Act, 21 U.S.C.section 360bbb-3(b)(1), unless the authorization is terminated  or revoked sooner.       Influenza A by PCR NEGATIVE NEGATIVE Final   Influenza B by PCR NEGATIVE NEGATIVE Final    Comment: (NOTE) The Xpert Xpress SARS-CoV-2/FLU/RSV plus assay is intended as an aid in the diagnosis of influenza from Nasopharyngeal swab  specimens and should not be used as a sole basis for treatment. Nasal washings and aspirates are unacceptable for Xpert Xpress SARS-CoV-2/FLU/RSV testing.  Fact Sheet for Patients: BloggerCourse.com  Fact Sheet for Healthcare Providers: SeriousBroker.it  This test is not yet approved or cleared by the [place]  FDA and has been authorized for detection and/or diagnosis of SARS-CoV-2 by FDA under an Emergency Use Authorization (EUA). This EUA will remain in effect (meaning this test can be used) for the duration of the COVID-19 declaration under Section 564(b)(1) of the Act, 21 U.S.C. section 360bbb-3(b)(1), unless the authorization is terminated or revoked.     Resp Syncytial Virus by PCR NEGATIVE NEGATIVE Final    Comment: (NOTE) Fact Sheet for Patients: BloggerCourse.com  Fact Sheet for Healthcare Providers: SeriousBroker.it  This test is not yet approved or cleared by the [place]  [place] FDA and has been authorized for detection and/or diagnosis of SARS-CoV-2 by FDA under an Emergency Use Authorization (EUA). This EUA will remain in effect (meaning this test can be used) for the duration of the COVID-19 declaration under Section 564(b)(1) of the Act, 21 U.S.C. section 360bbb-3(b)(1), unless the authorization is terminated or revoked.  Performed at [facility], [address] [place], [place] [address]   Respiratory (~20 pathogens) panel by PCR     Status: None   Collection Time: 05/11/24  4:52 PM   Specimen: Nasopharyngeal Swab; Respiratory  Result Value Ref Range Status   Adenovirus NOT DETECTED NOT DETECTED Final   Coronavirus 229E NOT DETECTED NOT DETECTED Final    Comment: (NOTE) The Coronavirus on the Respiratory Panel, DOES NOT test for the novel  Coronavirus (2019 nCoV)    Coronavirus HKU1 NOT DETECTED NOT DETECTED Final   Coronavirus  NL63 NOT DETECTED NOT DETECTED Final   Coronavirus OC43 NOT DETECTED NOT DETECTED Final   Metapneumovirus NOT DETECTED NOT DETECTED Final   Rhinovirus / Enterovirus NOT DETECTED NOT DETECTED Final   Influenza A NOT DETECTED NOT DETECTED Final   Influenza B NOT DETECTED NOT DETECTED Final   Parainfluenza Virus 1 NOT DETECTED NOT DETECTED Final   Parainfluenza Virus 2 NOT DETECTED NOT DETECTED Final   Parainfluenza Virus 3 NOT DETECTED NOT DETECTED Final   Parainfluenza Virus 4 NOT DETECTED NOT DETECTED Final   Respiratory Syncytial Virus NOT DETECTED NOT DETECTED Final   Bordetella pertussis NOT DETECTED NOT DETECTED Final   Bordetella Parapertussis NOT DETECTED NOT DETECTED Final   Chlamydophila pneumoniae NOT DETECTED NOT DETECTED Final   Mycoplasma pneumoniae NOT DETECTED NOT DETECTED Final    Comment: Performed at [facility], [address], [place], [place] [address]      Radiology Studies: DG Chest 2 View Result Date: 05/11/2024 CLINICAL DATA:  Chest pain with headache shortness of breath and chills. EXAM: CHEST - 2 VIEW COMPARISON:  10/21/2023 FINDINGS: Interval development of focal airspace disease in the posterior right upper lobe. Left lung clear. The cardiopericardial silhouette is within normal limits for size. No pleural effusion. No acute bony abnormality. Telemetry leads overlie the chest. IMPRESSION: Interval development of focal airspace disease in the posterior right upper lobe. Imaging features compatible with pneumonia. Follow-up recommended to ensure resolution. Electronically Signed   By: Donnal Fusi M.D.   On: 05/11/2024 11:25      LOS: 2 days    Aneita Keens, MD [facility] 05/13/2024, 8:44 AM   If 7PM-7AM, please contact night-coverage [email or web address]

## 2024-05-13 NOTE — Discharge Summary (Signed)
 Physician Discharge Summary   Patient: Renee Bryant MRN: [id number] DOB: [birth year]  Admit date:     05/11/2024  Discharge date: {dischdate:26783}  Discharge Physician: Aneita Keens   PCP: Everlina Hock, NP   Recommendations at discharge:  {Tip this will not be part of the note when signed- Example include specific recommendations for outpatient follow-up, pending tests to follow-up on. (Optional):26781}  ***  Discharge Diagnoses: Principal Problem:   AKI (acute kidney injury) (HCC) Active Problems:   Uncontrolled type 2 diabetes mellitus with hyperglycemia, with long-term current use of insulin  (HCC)   HIV (human immunodeficiency virus infection) (HCC)   Essential hypertension   CKD stage 3a, GFR 45-59 ml/min (HCC)   Diabetic polyneuropathy associated with type 2 diabetes mellitus (HCC)  Resolved Problems:   * No resolved hospital problems. [facility] Course: No notes on file  Assessment and Plan: No notes have been filed under this hospital service. Service: Hospitalist     {Tip this will not be part of the note when signed Body mass index is 40.41 kg/m. , ,  (Optional):26781}  {(NOTE) Pain control PDMP Statment (Optional):26782} Consultants: *** Procedures performed: ***  Disposition: {Plan; Disposition:26390} Diet recommendation:  {Diet_Plan:26776} DISCHARGE MEDICATION: Allergies as of 05/13/2024       Reactions   Ozempic (0.25 Or 0.5 Mg-dose) [semaglutide(0.25 Or 0.5mg -dos)] Nausea Only   Jardiance [empagliflozin] Other (See Comments)   Yeast infections   Metformin And Related Other (See Comments)   Flu-like symptoms   Pantoprazole  Other (See Comments)   Made the patient feel terrible   Wellbutrin [bupropion] Other (See Comments)   Unknown reaction   Trulicity [dulaglutide] Rash        Medication List     PAUSE taking these medications    Repatha  SureClick 140 MG/ML Soaj Wait to take this until your doctor or other care provider tells  you to start again. Generic drug: Evolocumab  Inject 140 mg into the skin every 14 (fourteen) days.       TAKE these medications    albuterol  108 (90 Base) MCG/ACT inhaler Commonly known as: VENTOLIN  HFA Inhale 1-2 puffs into the lungs every 6 (six) hours as needed for wheezing or shortness of breath.   aspirin  81 MG chewable tablet Chew 1 tablet (81 mg total) by mouth daily.   atorvastatin  80 MG tablet Commonly known as: LIPITOR  Take 1 tablet (80 mg total) by mouth daily.   azithromycin  500 MG tablet Commonly known as: ZITHROMAX  Take 1 tablet (500 mg total) by mouth daily for 2 days.   bictegravir-emtricitabine -tenofovir  AF 50-200-25 MG Tabs tablet Commonly known as: BIKTARVY  Take 1 tablet by mouth at bedtime.   carvedilol  3.125 MG tablet Commonly known as: COREG  Take 1 tablet (3.125 mg total) by mouth 2 (two) times daily with a meal.   cefdinir 300 MG capsule Commonly known as: OMNICEF Take 1 capsule (300 mg total) by mouth 2 (two) times daily for 2 days.   clonazePAM  1 MG tablet Commonly known as: KLONOPIN  Take 1 mg by mouth in the morning and at bedtime.   clopidogrel  75 MG tablet Commonly known as: PLAVIX  Take 1 tablet (75 mg total) by mouth daily.   Entresto  24-26 MG Generic drug: sacubitril -valsartan  TAKE ONE TABLET BY MOUTH TWICE DAILY   Excedrin  Extra Strength 250-250-65 MG tablet Generic drug: aspirin -acetaminophen -caffeine  Take 1-2 tablets by mouth every 6 (six) hours as needed for headache or migraine.   famotidine 20 MG tablet Commonly known as: PEPCID Take 20  mg by mouth 2 (two) times daily before a meal.   FreeStyle Libre 3 Plus Sensor Misc Inject 1 Device into the skin See admin instructions. Place 1 new sensor into the skin every 15 days   FreeStyle Libre 3 Sensor Misc Use to check blood glucose continuously.   furosemide  20 MG tablet Commonly known as: LASIX  Take 1 tablet (20 mg total) by mouth daily as needed for edema or fluid.    Insulin  Pen Needle 32G X 4 MM Misc Use 3 times a day with Humalog  Kwik pen   Insulin  Syringe-Needle U-100 31G X 5/16 0.3 ML Misc Commonly known as: GNP Insulin  Syringes 31Gx5/16 Use to inject Lantus  once a day   lansoprazole  30 MG disintegrating tablet Commonly known as: Prevacid  SoluTab Take 1 tablet (30 mg total) by mouth daily at 12 noon. What changed:  when to take this additional instructions   Lantus  SoloStar 100 UNIT/ML Solostar Pen Generic drug: insulin  glargine Inject 40 Units into the skin at bedtime.   Mounjaro 5 MG/0.5ML Pen Generic drug: tirzepatide Inject 5 mg into the skin every Tuesday.   nitroGLYCERIN  0.4 MG SL tablet Commonly known as: NITROSTAT  Place 1 tablet (0.4 mg total) under the tongue every 5 (five) minutes as needed for chest pain.   ranolazine  500 MG 12 hr tablet Commonly known as: RANEXA  Take 1 tablet (500 mg total) by mouth 2 (two) times daily.   valACYclovir 1000 MG tablet Commonly known as: VALTREX Take 1,000 mg by mouth daily as needed (for flares- AS DIRECTED).   Vitamin D  (Ergocalciferol ) 1.25 MG (50000 UNIT) Caps capsule Commonly known as: DRISDOL  TAKE 1 CAPSULE ONCE WEEKLY What changed: See the new instructions.   Vitamin D3 50 MCG (2000 UT) Tabs Take 2,000 Units by mouth daily.        Follow-up Information     Everlina Hock, NP. Schedule an appointment as soon as possible for a visit in 1 week(s).   Specialties: Family Medicine, Emergency Medicine Why: For hospital follow-up Contact information: [address] [place] [address] [phone]                Discharge Exam: Cleavon Curls Weights   05/11/24 1031 05/13/24 0500  Weight: 104.3 kg 117 kg   ***  Condition at discharge: {DC Condition:26389}  The results of significant diagnostics from this hospitalization (including imaging, microbiology, ancillary and laboratory) are listed below for reference.   Imaging Studies: DG Chest 2  View Result Date: 05/11/2024 CLINICAL DATA:  Chest pain with headache shortness of breath and chills. EXAM: CHEST - 2 VIEW COMPARISON:  10/21/2023 FINDINGS: Interval development of focal airspace disease in the posterior right upper lobe. Left lung clear. The cardiopericardial silhouette is within normal limits for size. No pleural effusion. No acute bony abnormality. Telemetry leads overlie the chest. IMPRESSION: Interval development of focal airspace disease in the posterior right upper lobe. Imaging features compatible with pneumonia. Follow-up recommended to ensure resolution. Electronically Signed   By: Donnal Fusi M.D.   On: 05/11/2024 11:25    Microbiology: Results for orders placed or performed during the hospital encounter of 05/11/24  Culture, blood (routine x 2)     Status: None (Preliminary result)   Collection Time: 05/11/24 12:02 PM   Specimen: BLOOD  Result Value Ref Range Status   Specimen Description   Final    BLOOD LEFT ANTECUBITAL Performed at [facility], [address] [place], [place] [address]  Special Requests   Final    BOTTLES DRAWN AEROBIC AND ANAEROBIC Blood Culture adequate volume Performed at [facility], [address] [place], [place] [address]    Culture   Final    NO GROWTH 2 DAYS Performed at [facility], [address] [place], [place] [address]    Report Status PENDING  Incomplete  Culture, blood (routine x 2)     Status: None (Preliminary result)   Collection Time: 05/11/24 12:07 PM   Specimen: BLOOD  Result Value Ref Range Status   Specimen Description   Final    BLOOD RIGHT ANTECUBITAL Performed at [facility], [address] [place], [place] [address]    Special Requests   Final    BOTTLES DRAWN AEROBIC AND ANAEROBIC Blood Culture adequate volume Performed at [facility], [address] [place], [place] [address]    Culture   Final    NO GROWTH 2 DAYS Performed at [facility], [address], [place], [place] [address]    Report Status PENDING  Incomplete  Resp panel by RT-PCR (RSV, Flu A&B, Covid) Anterior Nasal Swab     Status: None   Collection Time: 05/11/24  4:52 PM   Specimen: Anterior Nasal Swab  Result Value Ref Range Status   SARS Coronavirus 2 by RT PCR NEGATIVE NEGATIVE Final    Comment: (NOTE) SARS-CoV-2 target nucleic acids are NOT DETECTED.  The SARS-CoV-2 RNA is generally detectable in upper respiratory specimens during the acute phase of infection. The lowest concentration of SARS-CoV-2 viral copies this assay can detect is 138 copies/mL. A negative result does not preclude SARS-Cov-2 infection and should not be used as the sole basis for treatment or other patient management decisions. A negative result may occur with  improper specimen collection/handling, submission of specimen other than nasopharyngeal swab, presence of viral mutation(s) within the areas targeted by this assay, and inadequate number of viral copies(<138 copies/mL). A negative result must be combined with clinical observations, patient history, and epidemiological information. The expected result is Negative.  Fact Sheet for Patients:  BloggerCourse.com  Fact Sheet for Healthcare Providers:  SeriousBroker.it  This test is no t yet approved or cleared by the [place]  FDA and  has been authorized for detection and/or diagnosis of SARS-CoV-2 by FDA under an Emergency Use Authorization (EUA). This EUA will remain  in effect (meaning this test can be used) for the duration of the COVID-19 declaration under Section 564(b)(1) of the Act, 21 U.S.C.section 360bbb-3(b)(1), unless the authorization is terminated  or revoked sooner.       Influenza A by PCR NEGATIVE NEGATIVE Final   Influenza B by PCR NEGATIVE NEGATIVE Final    Comment: (NOTE) The Xpert Xpress SARS-CoV-2/FLU/RSV plus assay is intended as an  aid in the diagnosis of influenza from Nasopharyngeal swab specimens and should not be used as a sole basis for treatment. Nasal washings and aspirates are unacceptable for Xpert Xpress SARS-CoV-2/FLU/RSV testing.  Fact Sheet for Patients: BloggerCourse.com  Fact Sheet for Healthcare Providers: SeriousBroker.it  This test is not yet approved or cleared by the [place]  FDA and has been authorized for detection and/or diagnosis of SARS-CoV-2 by FDA under an Emergency Use Authorization (EUA). This EUA will remain in effect (meaning this test can be used) for the duration of the COVID-19 declaration under Section 564(b)(1) of the Act, 21 U.S.C. section 360bbb-3(b)(1), unless the authorization is terminated  or revoked.     Resp Syncytial Virus by PCR NEGATIVE NEGATIVE Final    Comment: (NOTE) Fact Sheet for Patients: BloggerCourse.com  Fact Sheet for Healthcare Providers: SeriousBroker.it  This test is not yet approved or cleared by the [place]  FDA and has been authorized for detection and/or diagnosis of SARS-CoV-2 by FDA under an Emergency Use Authorization (EUA). This EUA will remain in effect (meaning this test can be used) for the duration of the COVID-19 declaration under Section 564(b)(1) of the Act, 21 U.S.C. section 360bbb-3(b)(1), unless the authorization is terminated or revoked.  Performed at [facility], [address], [place], [place] [address]   Respiratory (~20 pathogens) panel by PCR     Status: None   Collection Time: 05/11/24  4:52 PM   Specimen: Nasopharyngeal Swab; Respiratory  Result Value Ref Range Status   Adenovirus NOT DETECTED NOT DETECTED Final   Coronavirus 229E NOT DETECTED NOT DETECTED Final    Comment: (NOTE) The Coronavirus on the Respiratory Panel, DOES NOT test for the novel  Coronavirus (2019 nCoV)     Coronavirus HKU1 NOT DETECTED NOT DETECTED Final   Coronavirus NL63 NOT DETECTED NOT DETECTED Final   Coronavirus OC43 NOT DETECTED NOT DETECTED Final   Metapneumovirus NOT DETECTED NOT DETECTED Final   Rhinovirus / Enterovirus NOT DETECTED NOT DETECTED Final   Influenza A NOT DETECTED NOT DETECTED Final   Influenza B NOT DETECTED NOT DETECTED Final   Parainfluenza Virus 1 NOT DETECTED NOT DETECTED Final   Parainfluenza Virus 2 NOT DETECTED NOT DETECTED Final   Parainfluenza Virus 3 NOT DETECTED NOT DETECTED Final   Parainfluenza Virus 4 NOT DETECTED NOT DETECTED Final   Respiratory Syncytial Virus NOT DETECTED NOT DETECTED Final   Bordetella pertussis NOT DETECTED NOT DETECTED Final   Bordetella Parapertussis NOT DETECTED NOT DETECTED Final   Chlamydophila pneumoniae NOT DETECTED NOT DETECTED Final   Mycoplasma pneumoniae NOT DETECTED NOT DETECTED Final    Comment: Performed at [facility], [address], [place], [place] [address]    Labs: CBC: Recent Labs  Lab 05/11/24 1030 05/11/24 1643 05/12/24 0840 05/13/24 0524  WBC 13.2* 13.8* 11.9* 9.5  NEUTROABS  --   --  9.1* 6.2  HGB 11.1* 10.2* 10.0* 9.8*  HCT 32.3* 31.1* 30.6* 30.0*  MCV 95.3 100.6* 98.1 100.0  PLT 256 220 238 230   Basic Metabolic Panel: Recent Labs  Lab 05/11/24 1030 05/11/24 1643 05/12/24 0840 05/13/24 0524  NA 132*  --  133* 132*  K 4.6  --  4.2 4.1  CL 97*  --  105 104  CO2 22  --  18* 22  GLUCOSE 264*  --  280* 179*  BUN 28*  --  29* 28*  CREATININE 2.79* 2.77* 2.33* 2.08*  CALCIUM  8.2*  --  7.6* 7.7*  MG  --   --  1.9 2.0   Liver Function Tests: Recent Labs  Lab 05/11/24 1041 05/12/24 0840 05/13/24 0524  AST 21 24 23   ALT 17 19 22   ALKPHOS 68 57 51  BILITOT 0.3 0.5 0.3  PROT 5.9* 5.5* 5.4*  ALBUMIN 2.5* 1.8* 1.7*   CBG: Recent Labs  Lab 05/12/24 1131 05/12/24 1658 05/12/24 2049 05/13/24 0742 05/13/24 1120  GLUCAP 399* 135* 212* 167* 211*    Discharge time spent:  {LESS THAN/GREATER THAN:26388} 30 minutes.  Signed: Aneita Keens, MD [facility] 05/13/2024

## 2024-05-13 NOTE — Discharge Instructions (Addendum)
 Renee Bryant,  You were in the hospital with pneumonia. You are being treated with antibiotics. Please continue on discharge and follow-up with your primary care physician. We discussed your fevers, and it seems this may be related to your weighted blanket use. There is no issue with using the blanket, but if you notice persistent/worsening fevers (temperature checked without having been under your blanket for at least 15 minutes), please discuss with your PCP immediately and/or return for reevaluation.

## 2024-05-13 NOTE — Plan of Care (Signed)
  Problem: Education: Goal: Knowledge of General Education information will improve Description: Including pain rating scale, medication(s)/side effects and non-pharmacologic comfort measures Outcome: Adequate for Discharge   Problem: Health Behavior/Discharge Planning: Goal: Ability to manage health-related needs will improve Outcome: Adequate for Discharge   Problem: Clinical Measurements: Goal: Ability to maintain clinical measurements within normal limits will improve Outcome: Adequate for Discharge Goal: Will remain free from infection Outcome: Adequate for Discharge Goal: Diagnostic test results will improve Outcome: Adequate for Discharge Goal: Respiratory complications will improve Outcome: Adequate for Discharge Goal: Cardiovascular complication will be avoided Outcome: Adequate for Discharge   Problem: Activity: Goal: Risk for activity intolerance will decrease Outcome: Adequate for Discharge   Problem: Nutrition: Goal: Adequate nutrition will be maintained Outcome: Adequate for Discharge   Problem: Coping: Goal: Level of anxiety will decrease Outcome: Adequate for Discharge   Problem: Elimination: Goal: Will not experience complications related to bowel motility Outcome: Adequate for Discharge Goal: Will not experience complications related to urinary retention Outcome: Adequate for Discharge   Problem: Pain Managment: Goal: General experience of comfort will improve and/or be controlled Outcome: Adequate for Discharge   Problem: Safety: Goal: Ability to remain free from injury will improve Outcome: Adequate for Discharge   Problem: Skin Integrity: Goal: Risk for impaired skin integrity will decrease Outcome: Adequate for Discharge   Problem: Activity: Goal: Ability to tolerate increased activity will improve Outcome: Adequate for Discharge   Problem: Clinical Measurements: Goal: Ability to maintain a body temperature in the normal range will  improve Outcome: Adequate for Discharge   Problem: Respiratory: Goal: Ability to maintain adequate ventilation will improve Outcome: Adequate for Discharge Goal: Ability to maintain a clear airway will improve Outcome: Adequate for Discharge   Problem: Education: Goal: Ability to describe self-care measures that may prevent or decrease complications (Diabetes Survival Skills Education) will improve Outcome: Adequate for Discharge Goal: Individualized Educational Video(s) Outcome: Adequate for Discharge   Problem: Coping: Goal: Ability to adjust to condition or change in health will improve Outcome: Adequate for Discharge   Problem: Fluid Volume: Goal: Ability to maintain a balanced intake and output will improve Outcome: Adequate for Discharge   Problem: Health Behavior/Discharge Planning: Goal: Ability to identify and utilize available resources and services will improve Outcome: Adequate for Discharge Goal: Ability to manage health-related needs will improve Outcome: Adequate for Discharge   Problem: Metabolic: Goal: Ability to maintain appropriate glucose levels will improve Outcome: Adequate for Discharge   Problem: Nutritional: Goal: Maintenance of adequate nutrition will improve Outcome: Adequate for Discharge Goal: Progress toward achieving an optimal weight will improve Outcome: Adequate for Discharge   Problem: Skin Integrity: Goal: Risk for impaired skin integrity will decrease Outcome: Adequate for Discharge   Problem: Tissue Perfusion: Goal: Adequacy of tissue perfusion will improve Outcome: Adequate for Discharge

## 2024-05-14 NOTE — Hospital Course (Signed)
 Renee Bryant is a 42 y.o. female with a history of HIV, NSTEMI, asthma, CAD, diabetes mellitus, CKD, hypertension, heart failure.  Patient presented secondary to severe generalized weakness and found to have evidence of right upper lobe pneumonia.  Patient started on empiric antibiotics with improvement of symptoms.  Patient also with AKI which was managed with IV fluids.

## 2024-05-16 LAB — CULTURE, BLOOD (ROUTINE X 2)
Culture: NO GROWTH
Culture: NO GROWTH
Special Requests: ADEQUATE
Special Requests: ADEQUATE

## 2024-05-19 ENCOUNTER — Ambulatory Visit (INDEPENDENT_AMBULATORY_CARE_PROVIDER_SITE_OTHER): Admitting: Family Medicine

## 2024-05-19 ENCOUNTER — Ambulatory Visit: Payer: Self-pay | Admitting: Family Medicine

## 2024-05-19 ENCOUNTER — Encounter: Payer: Self-pay | Admitting: Family Medicine

## 2024-05-19 VITALS — BP 128/79 | HR 80 | Ht 67.0 in | Wt 221.0 lb

## 2024-05-19 DIAGNOSIS — R079 Chest pain, unspecified: Secondary | ICD-10-CM

## 2024-05-19 DIAGNOSIS — Z09 Encounter for follow-up examination after completed treatment for conditions other than malignant neoplasm: Secondary | ICD-10-CM

## 2024-05-19 DIAGNOSIS — J189 Pneumonia, unspecified organism: Secondary | ICD-10-CM

## 2024-05-19 LAB — CBC WITH DIFFERENTIAL/PLATELET
Basophils Absolute: 0.1 10*3/uL (ref 0.0–0.1)
Basophils Relative: 0.8 % (ref 0.0–3.0)
Eosinophils Absolute: 0.2 10*3/uL (ref 0.0–0.7)
Eosinophils Relative: 3.2 % (ref 0.0–5.0)
HCT: 31.5 % — ABNORMAL LOW (ref 36.0–46.0)
Hemoglobin: 10.8 g/dL — ABNORMAL LOW (ref 12.0–15.0)
Lymphocytes Relative: 24.6 % (ref 12.0–46.0)
Lymphs Abs: 1.8 10*3/uL (ref 0.7–4.0)
MCHC: 34.1 g/dL (ref 30.0–36.0)
MCV: 94.7 fl (ref 78.0–100.0)
Monocytes Absolute: 0.7 10*3/uL (ref 0.1–1.0)
Monocytes Relative: 9 % (ref 3.0–12.0)
Neutro Abs: 4.6 10*3/uL (ref 1.4–7.7)
Neutrophils Relative %: 62.4 % (ref 43.0–77.0)
Platelets: 421 10*3/uL — ABNORMAL HIGH (ref 150.0–400.0)
RBC: 3.33 Mil/uL — ABNORMAL LOW (ref 3.87–5.11)
RDW: 12.2 % (ref 11.5–15.5)
WBC: 7.3 10*3/uL (ref 4.0–10.5)

## 2024-05-19 LAB — COMPREHENSIVE METABOLIC PANEL WITH GFR
ALT: 34 U/L (ref 0–35)
AST: 27 U/L (ref 0–37)
Albumin: 2.8 g/dL — ABNORMAL LOW (ref 3.5–5.2)
Alkaline Phosphatase: 61 U/L (ref 39–117)
BUN: 33 mg/dL — ABNORMAL HIGH (ref 6–23)
CO2: 29 meq/L (ref 19–32)
Calcium: 8.6 mg/dL (ref 8.4–10.5)
Chloride: 103 meq/L (ref 96–112)
Creatinine, Ser: 1.94 mg/dL — ABNORMAL HIGH (ref 0.40–1.20)
GFR: 31.5 mL/min — ABNORMAL LOW (ref >60.00)
Glucose, Bld: 214 mg/dL — ABNORMAL HIGH (ref 70–99)
Potassium: 4.6 meq/L (ref 3.5–5.1)
Sodium: 135 meq/L (ref 135–145)
Total Bilirubin: 0.3 mg/dL (ref 0.2–1.2)
Total Protein: 6.2 g/dL (ref 6.0–8.3)

## 2024-05-19 LAB — EKG 12-LEAD

## 2024-05-19 LAB — TROPONIN I (HIGH SENSITIVITY): High Sens Troponin I: 10 ng/L (ref 2–17)

## 2024-05-19 NOTE — Progress Notes (Signed)
 Seen 05/11/2024 -  Right upper lobe pneumonia Chest x-ray significant for posterior right upper lobe. Patient started on Ceftriaxone  and azithromycin  for empiric treatment and was transitioned to Cefdinir and azithromycin  on discharge.

## 2024-05-19 NOTE — Progress Notes (Signed)
 Acute Office Visit  Subjective:     Patient ID: Renee Bryant, female    DOB: 1981/01/21, 43 y.o.   MRN: [id number]  Chief Complaint  Patient presents with   Hospitalization Follow-up    HPI Patient is in today for hospital follow-up (pneumonia) and recent chest pain.   Discussed the use of AI scribe software for clinical note transcription with the patient, who gave verbal consent to proceed.  History of Present Illness Renee Bryant is a 43 year old female with HIV, diabetes, and a past heart attack who presents with chest pain.  She experiences intermittent chest pain with episodes of shortness of breath and pain radiating to her back and left arm. The pain is sometimes relieved by nitroglycerin , which she uses based on her cardiologist's previous advice. She also experiences angina and uses Ranexa  for management. She has difficulty distinguishing between chronic chest pain and potential heart attack symptoms.  She was recently hospitalized for pneumonia, discovered incidentally during a chest x-ray after experiencing persistent chills without fever, cough, or other typical symptoms. During her hospital stay, she developed a fever twice, which she attributes to using a weighted blanket. She was treated with IV azithromycin  and ceftriaxone , then switched to oral cefdinir and azithromycin  upon discharge with a two-day course of oral antibiotics. She has no current fever or respiratory symptoms.  She has a history of a heart attack. Her last echocardiogram showed an ejection fraction of 55-60%, and she was cleared by her cardiologist in May. She is concerned about her heart health, especially given her recent symptoms and past medical history.  She works long hours, including 14-hour shifts, and has been active since her recent hospital discharge, including visits to [facility] - she has been 3 times since her hospital discharge last Wednesday. She ensures she stays hydrated  during these activities. 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      ROS All review of systems negative except what is listed in the HPI      Objective:    BP 128/79   Pulse 80   Ht 5' 7 (1.702 m)   Wt 221 lb (100.2 kg)   SpO2 99%   BMI 34.61 kg/m    Physical Exam Vitals reviewed.  Constitutional:      General: She is not in acute distress.    Appearance: Normal appearance. She is not ill-appearing.   Cardiovascular:     Rate and Rhythm: Normal rate and regular rhythm.     Heart sounds: Normal heart sounds.  Pulmonary:     Effort: Pulmonary effort is normal.     Breath sounds: Normal breath sounds.   Skin:    General: Skin is warm and dry.   Neurological:     Mental Status: She is alert and oriented to person, place, and time.   Psychiatric:        Mood and Affect: Mood normal.        Behavior: Behavior normal.        Thought Content: Thought content normal.        Judgment: Judgment normal.     Results for orders placed or performed in visit on 05/19/24  EKG 12-Lead  Result Value Ref Range   EKG 12 lead          Assessment & Plan:   Problem List Items Addressed This Visit   None Visit  Diagnoses       Chest pain, unspecified type    -  Primary   Relevant Orders   Troponin I (High Sensitivity)   EKG 12-Lead (Completed)     Hospital discharge follow-up       Relevant Orders   DG Chest 2 View   CBC with Differential/Platelet   Comprehensive metabolic panel with GFR     Pneumonia of right upper lobe due to infectious organism       Relevant Orders   DG Chest 2 View   CBC with Differential/Platelet   Comprehensive metabolic panel with GFR         Assessment & Plan Chest Pain Intermittent chest pain radiating to back and left arm, suggestive of cardiac etiology. History of NSTEMI and angina. Differential includes angina and potential myocardial ischemia. Uses  nitroglycerin  for relief. She really wants to try to avoid the ED if possible. - EKG today - SR 70, inverted T-wave in V2, but otherwise stable and consistent with prior EKGs, no ST elevation - Order STAT troponin level to evaluate for myocardial injury. - Advise to avoid overexertion and stay hydrated, especially in hot weather. - Schedule soon cardiology follow-up.  Pneumonia Recent hospitalization for right upper lobe pneumonia with atypical presentation. Treated with IV and oral antibiotics. Currently asymptomatic.  - Order chest x-ray in one month to ensure resolution. - Repeat labs      No orders of the defined types were placed in this encounter.   Return if symptoms worsen or fail to improve.  Waddell KATHEE Mon, NP

## 2024-05-21 DIAGNOSIS — F17210 Nicotine dependence, cigarettes, uncomplicated: Secondary | ICD-10-CM | POA: Diagnosis not present

## 2024-05-21 DIAGNOSIS — Z743 Need for continuous supervision: Secondary | ICD-10-CM | POA: Diagnosis not present

## 2024-05-21 DIAGNOSIS — R0789 Other chest pain: Secondary | ICD-10-CM | POA: Diagnosis not present

## 2024-05-21 DIAGNOSIS — R079 Chest pain, unspecified: Secondary | ICD-10-CM | POA: Diagnosis not present

## 2024-05-21 DIAGNOSIS — I10 Essential (primary) hypertension: Secondary | ICD-10-CM | POA: Diagnosis not present

## 2024-05-21 DIAGNOSIS — Z7902 Long term (current) use of antithrombotics/antiplatelets: Secondary | ICD-10-CM | POA: Diagnosis not present

## 2024-05-21 DIAGNOSIS — Z79899 Other long term (current) drug therapy: Secondary | ICD-10-CM | POA: Diagnosis not present

## 2024-05-28 DIAGNOSIS — R079 Chest pain, unspecified: Secondary | ICD-10-CM | POA: Diagnosis not present

## 2024-05-28 DIAGNOSIS — I493 Ventricular premature depolarization: Secondary | ICD-10-CM | POA: Diagnosis not present

## 2024-06-02 DIAGNOSIS — Z7985 Long-term (current) use of injectable non-insulin antidiabetic drugs: Secondary | ICD-10-CM | POA: Diagnosis not present

## 2024-06-02 DIAGNOSIS — F17210 Nicotine dependence, cigarettes, uncomplicated: Secondary | ICD-10-CM | POA: Diagnosis not present

## 2024-06-02 DIAGNOSIS — Z79899 Other long term (current) drug therapy: Secondary | ICD-10-CM | POA: Diagnosis not present

## 2024-06-02 DIAGNOSIS — E1169 Type 2 diabetes mellitus with other specified complication: Secondary | ICD-10-CM | POA: Diagnosis not present

## 2024-06-02 DIAGNOSIS — I10 Essential (primary) hypertension: Secondary | ICD-10-CM | POA: Diagnosis not present

## 2024-06-02 DIAGNOSIS — Z8249 Family history of ischemic heart disease and other diseases of the circulatory system: Secondary | ICD-10-CM | POA: Diagnosis not present

## 2024-06-02 DIAGNOSIS — Z7982 Long term (current) use of aspirin: Secondary | ICD-10-CM | POA: Diagnosis not present

## 2024-06-02 DIAGNOSIS — E1165 Type 2 diabetes mellitus with hyperglycemia: Secondary | ICD-10-CM | POA: Diagnosis not present

## 2024-06-02 DIAGNOSIS — Z978 Presence of other specified devices: Secondary | ICD-10-CM | POA: Diagnosis not present

## 2024-06-02 DIAGNOSIS — E785 Hyperlipidemia, unspecified: Secondary | ICD-10-CM | POA: Diagnosis not present

## 2024-06-02 DIAGNOSIS — Z794 Long term (current) use of insulin: Secondary | ICD-10-CM | POA: Diagnosis not present

## 2024-06-06 DIAGNOSIS — Z419 Encounter for procedure for purposes other than remedying health state, unspecified: Secondary | ICD-10-CM | POA: Diagnosis not present

## 2024-06-24 ENCOUNTER — Telehealth: Payer: Self-pay | Admitting: Cardiovascular Disease

## 2024-06-24 NOTE — Telephone Encounter (Signed)
 Pt c/o of Chest Pain: STAT if active (IN THIS MOMENT) CP, including tightness, pressure, jaw pain, shoulder/upper arm/back pain, SOB, nausea, and vomiting.  1. Are you having CP right now (tightness, pressure, or discomfort)? No   2. Are you experiencing any other symptoms (ex. SOB, nausea, vomiting, sweating)?   3. How long have you been experiencing CP? For about a month, she states it has stopped since she has pneumonia   4. Is your CP continuous or coming and going? Coming and going usually went laying down   5. Have you taken Nitroglycerin ? She states she takes it about 2-3 times a day.   6. If CP returns before callback, please consider calling 911. ?

## 2024-06-24 NOTE — Telephone Encounter (Signed)
 Called patient left message on personal voice mail to call back.Office is closed now.Advised if you are having chest pain at present you will need to go to ED to be evaluated.Advised office opens at 8:00 am tomorrow call back to discuss symptoms.

## 2024-06-29 NOTE — Telephone Encounter (Signed)
Unable to reach pt or leave a message mailbox is full 

## 2024-07-07 DIAGNOSIS — Z419 Encounter for procedure for purposes other than remedying health state, unspecified: Secondary | ICD-10-CM | POA: Diagnosis not present

## 2024-07-24 ENCOUNTER — Other Ambulatory Visit: Payer: Self-pay | Admitting: Family Medicine

## 2024-08-07 DIAGNOSIS — F4310 Post-traumatic stress disorder, unspecified: Secondary | ICD-10-CM | POA: Diagnosis not present

## 2024-08-07 DIAGNOSIS — E6609 Other obesity due to excess calories: Secondary | ICD-10-CM | POA: Diagnosis not present

## 2024-08-07 DIAGNOSIS — Z3200 Encounter for pregnancy test, result unknown: Secondary | ICD-10-CM | POA: Diagnosis not present

## 2024-08-07 DIAGNOSIS — E1165 Type 2 diabetes mellitus with hyperglycemia: Secondary | ICD-10-CM | POA: Diagnosis not present

## 2024-08-07 DIAGNOSIS — F411 Generalized anxiety disorder: Secondary | ICD-10-CM | POA: Diagnosis not present

## 2024-08-07 DIAGNOSIS — Z419 Encounter for procedure for purposes other than remedying health state, unspecified: Secondary | ICD-10-CM | POA: Diagnosis not present

## 2024-08-07 DIAGNOSIS — F330 Major depressive disorder, recurrent, mild: Secondary | ICD-10-CM | POA: Diagnosis not present

## 2024-08-07 DIAGNOSIS — G4700 Insomnia, unspecified: Secondary | ICD-10-CM | POA: Diagnosis not present

## 2024-08-07 DIAGNOSIS — F410 Panic disorder [episodic paroxysmal anxiety] without agoraphobia: Secondary | ICD-10-CM | POA: Diagnosis not present

## 2024-08-07 DIAGNOSIS — Z6832 Body mass index (BMI) 32.0-32.9, adult: Secondary | ICD-10-CM | POA: Diagnosis not present

## 2024-08-07 DIAGNOSIS — Z79899 Other long term (current) drug therapy: Secondary | ICD-10-CM | POA: Diagnosis not present

## 2024-08-07 DIAGNOSIS — R030 Elevated blood-pressure reading, without diagnosis of hypertension: Secondary | ICD-10-CM | POA: Diagnosis not present

## 2024-08-12 DIAGNOSIS — Z79899 Other long term (current) drug therapy: Secondary | ICD-10-CM | POA: Diagnosis not present

## 2024-08-19 DIAGNOSIS — E1122 Type 2 diabetes mellitus with diabetic chronic kidney disease: Secondary | ICD-10-CM | POA: Diagnosis not present

## 2024-08-19 DIAGNOSIS — Z794 Long term (current) use of insulin: Secondary | ICD-10-CM | POA: Diagnosis not present

## 2024-08-19 DIAGNOSIS — I129 Hypertensive chronic kidney disease with stage 1 through stage 4 chronic kidney disease, or unspecified chronic kidney disease: Secondary | ICD-10-CM | POA: Diagnosis not present

## 2024-08-19 DIAGNOSIS — M898X9 Other specified disorders of bone, unspecified site: Secondary | ICD-10-CM | POA: Diagnosis not present

## 2024-08-19 DIAGNOSIS — N1832 Chronic kidney disease, stage 3b: Secondary | ICD-10-CM | POA: Diagnosis not present

## 2024-08-19 DIAGNOSIS — E559 Vitamin D deficiency, unspecified: Secondary | ICD-10-CM | POA: Diagnosis not present

## 2024-08-19 DIAGNOSIS — B20 Human immunodeficiency virus [HIV] disease: Secondary | ICD-10-CM | POA: Diagnosis not present

## 2024-08-21 DIAGNOSIS — B20 Human immunodeficiency virus [HIV] disease: Secondary | ICD-10-CM | POA: Diagnosis not present

## 2024-08-31 ENCOUNTER — Other Ambulatory Visit: Payer: Self-pay | Admitting: Student

## 2024-08-31 ENCOUNTER — Other Ambulatory Visit: Payer: Self-pay | Admitting: Cardiovascular Disease

## 2024-09-08 DIAGNOSIS — Z8619 Personal history of other infectious and parasitic diseases: Secondary | ICD-10-CM | POA: Diagnosis not present

## 2024-09-08 DIAGNOSIS — Z1231 Encounter for screening mammogram for malignant neoplasm of breast: Secondary | ICD-10-CM | POA: Diagnosis not present

## 2024-09-08 DIAGNOSIS — Z124 Encounter for screening for malignant neoplasm of cervix: Secondary | ICD-10-CM | POA: Diagnosis not present

## 2024-09-08 DIAGNOSIS — N9089 Other specified noninflammatory disorders of vulva and perineum: Secondary | ICD-10-CM | POA: Diagnosis not present

## 2024-09-08 DIAGNOSIS — Z01419 Encounter for gynecological examination (general) (routine) without abnormal findings: Secondary | ICD-10-CM | POA: Diagnosis not present

## 2024-09-11 ENCOUNTER — Other Ambulatory Visit: Payer: Self-pay | Admitting: Cardiovascular Disease

## 2024-09-22 ENCOUNTER — Other Ambulatory Visit: Payer: Self-pay | Admitting: Cardiovascular Disease

## 2024-09-22 DIAGNOSIS — N920 Excessive and frequent menstruation with regular cycle: Secondary | ICD-10-CM | POA: Diagnosis not present

## 2024-09-22 DIAGNOSIS — N9089 Other specified noninflammatory disorders of vulva and perineum: Secondary | ICD-10-CM | POA: Diagnosis not present

## 2024-09-22 DIAGNOSIS — D071 Carcinoma in situ of vulva: Secondary | ICD-10-CM | POA: Diagnosis not present

## 2024-09-22 DIAGNOSIS — D070 Carcinoma in situ of endometrium: Secondary | ICD-10-CM | POA: Diagnosis not present

## 2024-10-01 ENCOUNTER — Encounter: Admitting: Family Medicine

## 2024-10-01 DIAGNOSIS — N9089 Other specified noninflammatory disorders of vulva and perineum: Secondary | ICD-10-CM | POA: Diagnosis not present

## 2024-10-05 ENCOUNTER — Encounter: Payer: Self-pay | Admitting: Family Medicine

## 2024-10-05 ENCOUNTER — Ambulatory Visit (INDEPENDENT_AMBULATORY_CARE_PROVIDER_SITE_OTHER): Admitting: Family Medicine

## 2024-10-05 VITALS — BP 146/97 | HR 86 | Temp 98.7°F | Resp 18 | Ht 67.0 in | Wt 210.2 lb

## 2024-10-05 DIAGNOSIS — Z0000 Encounter for general adult medical examination without abnormal findings: Secondary | ICD-10-CM

## 2024-10-05 DIAGNOSIS — N9089 Other specified noninflammatory disorders of vulva and perineum: Secondary | ICD-10-CM | POA: Diagnosis not present

## 2024-10-05 DIAGNOSIS — J0140 Acute pansinusitis, unspecified: Secondary | ICD-10-CM | POA: Diagnosis not present

## 2024-10-05 DIAGNOSIS — R519 Headache, unspecified: Secondary | ICD-10-CM

## 2024-10-05 DIAGNOSIS — I10 Essential (primary) hypertension: Secondary | ICD-10-CM | POA: Diagnosis not present

## 2024-10-05 DIAGNOSIS — E1165 Type 2 diabetes mellitus with hyperglycemia: Secondary | ICD-10-CM

## 2024-10-05 DIAGNOSIS — R413 Other amnesia: Secondary | ICD-10-CM

## 2024-10-05 DIAGNOSIS — Z794 Long term (current) use of insulin: Secondary | ICD-10-CM

## 2024-10-05 MED ORDER — DOXYCYCLINE HYCLATE 100 MG PO TABS: 100.0000 mg | ORAL_TABLET | Freq: Two times a day (BID) | ORAL | 0 refills | Status: AC

## 2024-10-05 NOTE — Assessment & Plan Note (Signed)
 Following with [facility]. Due for A1c and microalbumin.

## 2024-10-05 NOTE — Progress Notes (Signed)
 Complete physical exam  Patient: Renee Bryant   DOB: 12-13-1980   43 y.o. Female  MRN: [id number]  Subjective:    Chief Complaint  Patient presents with   Annual Exam    Patient is here for her annual exam    [name] is a 42 y.o. female who presents today for a complete physical exam. She reports consuming a general diet. The patient does not participate in regular exercise at present. She generally feels fairly well. She reports sleeping fairly well. She does have additional problems to discuss today.   Currently lives with: boyfriend, daughter Acute concerns or interim problems since last visit:   Discussed the use of AI scribe software for clinical note transcription with the patient, who gave verbal consent to proceed.  History of Present Illness Renee Bryant is a 43 year old female who presents for a follow-up visit.  She reports that her kidney function has improved and her leg swelling has decreased. Over the past three to four months, she has lost weight, dropping from nearly 240 pounds. Her blood sugar was 220 earlier today and has since decreased to 209. She is currently on Mounjaro  for blood sugar management. Following with endocrinology.  She recently underwent a gynecological procedure to remove dysplasia and is awaiting results. She has a history of HPV, and her mother had cervical cancer, now in remission.  She experiences memory issues and headaches. She describes episodes where she cannot recall conversations and has been waking up with headaches three to four times a week. She also reports insomnia and restless sleep, with a history of stopping breathing during sleep, as identified in a past sleep study.  She suspects a sinus infection, experiencing symptoms for about two weeks, including mucus and sinus pressure. No fever or breathing difficulties.  She quit smoking in November of last year and has been focusing on improving her diet, primarily  consuming one meal a day supplemented with protein smoothies. 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     Vision concerns: no Dental concerns: no STD concerns: no  ETOH use: no Nicotine use: no Recreational drugs/illegal substances: no       Most recent fall risk assessment:    10/05/2024    2:52 PM  Fall Risk   Falls in the past year? 0  Number falls in past yr: 0  Injury with Fall? 0  Risk for fall due to : No Fall Risks  Follow up Falls evaluation completed     Most recent depression screenings:    10/05/2024    2:52 PM 07/08/2023    1:31 PM  PHQ 2/9 Scores  PHQ - 2 Score 1 0  PHQ- 9 Score 5 1      Data saved with a previous flowsheet row definition            Patient Care Team: Almarie Waddell NOVAK, NP as PCP - General (Family Medicine) O'Neal, Darryle Ned, MD as PCP - Cardiology (Cardiology)   Outpatient Medications Prior to Visit  Medication Sig   albuterol  (VENTOLIN  HFA) 108 (90 Base) MCG/ACT inhaler Inhale 1-2 puffs into the lungs every 6 (six) hours as needed for wheezing or shortness of breath.   aspirin  81 MG chewable tablet Chew 1 tablet (81 mg total) by mouth daily.   atorvastatin  (LIPITOR ) 80 MG tablet TAKE 1 TABLET (80 MG TOTAL) BY MOUTH DAILY.   bictegravir-emtricitabine -tenofovir  AF (BIKTARVY ) 50-200-25 MG TABS tablet Take 1  tablet by mouth at bedtime.   carvedilol  (COREG ) 3.125 MG tablet TAKE 1 TABLET (3.125 MG TOTAL) BY MOUTH 2 (TWO) TIMES DAILY WITH A MEAL.   Cholecalciferol (VITAMIN D3) 50 MCG (2000 UT) TABS Take 2,000 Units by mouth daily.   clonazePAM  (KLONOPIN ) 1 MG tablet Take 1 mg by mouth in the morning and at bedtime.   clopidogrel  (PLAVIX ) 75 MG tablet TAKE 1 TABLET (75 MG TOTAL) BY MOUTH DAILY.   Continuous Glucose Sensor (FREESTYLE LIBRE 3 PLUS SENSOR) MISC Inject 1 Device into the skin See admin instructions. Place 1 new sensor into the skin every 15 days   Continuous Glucose Sensor (FREESTYLE LIBRE  3 SENSOR) MISC Use to check blood glucose continuously.   ENTRESTO  24-26 MG TAKE ONE TABLET BY MOUTH TWICE DAILY   [Paused] Evolocumab  (REPATHA  SURECLICK) 140 MG/ML SOAJ Inject 140 mg into the skin every 14 (fourteen) days.   EXCEDRIN  EXTRA STRENGTH 250-250-65 MG tablet Take 1-2 tablets by mouth every 6 (six) hours as needed for headache or migraine.   famotidine  (PEPCID ) 20 MG tablet Take 20 mg by mouth 2 (two) times daily before a meal.   furosemide  (LASIX ) 20 MG tablet Take 1 tablet (20 mg total) by mouth daily as needed for edema or fluid.   Insulin  Pen Needle 32G X 4 MM MISC Use 3 times a day with Humalog  Kwik pen   Insulin  Syringe-Needle U-100 (GNP INSULIN  SYRINGES 31GX5/16) 31G X 5/16 0.3 ML MISC Use to inject Lantus  once a day   lansoprazole  (PREVACID  SOLUTAB) 30 MG disintegrating tablet Take 1 tablet (30 mg total) by mouth daily at 12 noon. (Patient taking differently: Take 30 mg by mouth See admin instructions. Dissolve 30 mg in the mouth in the afternoon)   LANTUS  SOLOSTAR 100 UNIT/ML Solostar Pen Inject 40 Units into the skin at bedtime.   MOUNJARO  5 MG/0.5ML Pen Inject 5 mg into the skin every Tuesday.   nitroGLYCERIN  (NITROSTAT ) 0.4 MG SL tablet Place 1 tablet (0.4 mg total) under the tongue every 5 (five) minutes as needed for chest pain.   ranolazine  (RANEXA ) 500 MG 12 hr tablet TAKE 1 TABLET (500 MG TOTAL) BY MOUTH 2 (TWO) TIMES DAILY.   valACYclovir (VALTREX) 1000 MG tablet Take 1,000 mg by mouth daily as needed (for flares- AS DIRECTED).   Vitamin D , Ergocalciferol , (DRISDOL ) 1.25 MG (50000 UNIT) CAPS capsule TAKE 1 CAPSULE ONCE WEEKLY (Patient taking differently: Take 50,000 Units by mouth See admin instructions. Take 50,000 units by mouth on Tuesdays and Thursdays)   No facility-administered medications prior to visit.    ROS All review of systems negative except what is listed in the HPI        Objective:     BP (!) 146/97   Pulse 86   Temp 98.7 F (37.1 C)  (Oral)   Resp 18   Ht 5' 7 (1.702 m)   Wt 210 lb 3.2 oz (95.3 kg)   SpO2 99%   BMI 32.92 kg/m    Physical Exam Vitals reviewed.  Constitutional:      General: She is not in acute distress.    Appearance: Normal appearance. She is not ill-appearing.  HENT:     Head: Normocephalic and atraumatic.     Right Ear: A middle ear effusion is present.     Left Ear: A middle ear effusion is present.     Nose:     Right Sinus: Maxillary sinus tenderness and frontal sinus tenderness present.  Left Sinus: Maxillary sinus tenderness and frontal sinus tenderness present.     Mouth/Throat:     Mouth: Mucous membranes are moist.     Pharynx: Oropharynx is clear.  Eyes:     Extraocular Movements: Extraocular movements intact.     Conjunctiva/sclera: Conjunctivae normal.     Pupils: Pupils are equal, round, and reactive to light.  Cardiovascular:     Rate and Rhythm: Normal rate and regular rhythm.     Pulses: Normal pulses.     Heart sounds: Normal heart sounds.  Pulmonary:     Effort: Pulmonary effort is normal.     Breath sounds: Normal breath sounds.  Abdominal:     General: Abdomen is flat. Bowel sounds are normal. There is no distension.     Palpations: Abdomen is soft. There is no mass.     Tenderness: There is no abdominal tenderness. There is no right CVA tenderness, left CVA tenderness, guarding or rebound.  Genitourinary:    Comments: Deferred exam Musculoskeletal:        General: Normal range of motion.     Cervical back: Normal range of motion and neck supple. No tenderness.     Right lower leg: No edema.     Left lower leg: No edema.  Lymphadenopathy:     Cervical: No cervical adenopathy.  Skin:    General: Skin is warm and dry.     Capillary Refill: Capillary refill takes less than 2 seconds.  Neurological:     General: No focal deficit present.     Mental Status: She is alert and oriented to person, place, and time. Mental status is at baseline.  Psychiatric:         Mood and Affect: Mood normal.        Behavior: Behavior normal.        Thought Content: Thought content normal.        Judgment: Judgment normal.       No results found for any visits on 10/05/24.     Assessment & Plan:    Routine Health Maintenance and Physical Exam Discussed health promotion and safety including diet and exercise recommendations, dental health, and injury prevention. Tobacco cessation if applicable. Seat belts, sunscreen, smoke detectors, etc.    Immunization History  Administered Date(s) Administered   DTP 09/13/1982, 11/15/1982, 01/10/1983, 04/08/1984, 07/27/1986   Hep A / Hep B 06/18/2012, 07/17/2012   Hep B, Unspecified 09/30/1998   Hepatitis A, Ped/Adol-2 Dose 01/30/2011   Hepatitis B, PED/ADOLESCENT 01/30/2011   Influenza Split 12/25/2010, 09/23/2012   Influenza,inj,Quad PF,6+ Mos 10/08/2019   Influenza,inj,quad, With Preservative 08/29/2018   MMR 09/12/1983   OPV 09/13/1982, 11/15/1982, 01/01/1984, 07/27/1986   PPD Test 12/25/2010   Pneumococcal Polysaccharide-23 12/25/2010, 10/08/2019   Tdap 06/18/2012, 11/24/2017    Health Maintenance  Topic Date Due   Diabetic kidney evaluation - Urine ACR  Never done   Hepatitis C Screening  Never done   Mammogram  Never done   OPHTHALMOLOGY EXAM  05/27/2024   Influenza Vaccine  02/23/2025 (Originally 06/26/2024)   Pneumococcal Vaccine (3 of 3 - PCV) 03/31/2025 (Originally 10/07/2020)   FOOT EXAM  03/31/2025 (Originally 06/11/1992)   HPV VACCINES (1 - Risk 3-dose SCDM series) 05/18/2025 (Originally 06/11/2009)   COVID-19 Vaccine (1) 10/02/2025 (Originally 06/12/1987)   HEMOGLOBIN A1C  11/12/2024   Diabetic kidney evaluation - eGFR measurement  05/19/2025   DTaP/Tdap/Td (8 - Td or Tdap) 11/25/2027   Cervical Cancer Screening (HPV/Pap Cotest)  09/08/2029  HIV Screening  Completed   Meningococcal B Vaccine  Aged Out   Hepatitis B Vaccines 19-59 Average Risk  Discontinued        Problem List Items  Addressed This Visit       Active Problems   Essential hypertension (Chronic)   Blood pressure slightly elevated at 146/97 due to missed medication dose. Home readings are normal. Regular follow-up with cardiology is due. - BP check in 2 weeks - Schedule follow-up with cardiology.      Uncontrolled type 2 diabetes mellitus with hyperglycemia, with long-term current use of insulin  (HCC) (Chronic)   Following with [facility]. Due for A1c and microalbumin.       Relevant Orders   Microalbumin / creatinine urine ratio   Hemoglobin A1c   Other Visit Diagnoses       Annual physical exam    -  Primary   Relevant Orders   Comprehensive metabolic panel with GFR   TSH     Nonintractable headache, unspecified chronicity pattern, unspecified headache type     Headaches 3-4 days a week, worse in the morning. Memory issues and insomnia with snoring and restless sleep. Previous sleep study showed sleep apnea. Neurology referral needed for further evaluation. - Referred to neurology for evaluation of headaches, memory issues, and insomnia. - Will consider sleep study if indicated by neurology.   Relevant Orders   Ambulatory referral to Neurology     Memory changes       Relevant Orders   Ambulatory referral to Neurology     Acute non-recurrent pansinusitis     Sinus infection for two weeks with pressure and mucus. No fever or breathing issues. Doxycycline  chosen for safety with kidney function. - Prescribed doxycycline . - Continue Mucinex, nasal sprays, and supportive measures.    Relevant Medications   doxycycline  (VIBRA -TABS) 100 MG tablet        PATIENT COUNSELING:    Recommend that most people either abstain from alcohol or drink within safe limits (<=14/week and <=4 drinks/occasion for males, <=7/weeks and <= 3 drinks/occasion for females) and that the risk for alcohol disorders and other health effects rises proportionally with the number of drinks per week and how often  a drinker exceeds daily limits.   Diet: Recommend to adjust caloric intake to maintain or achieve ideal body weight, to reduce intake of dietary saturated fat and total fat, to limit sodium intake by avoiding high sodium foods and not adding table salt, and to maintain adequate dietary potassium and calcium  preferably from fresh fruits, vegetables, and low-fat dairy products.   Emphasized the importance of regular exercise.  Injury prevention: Recommend seatbelts, safety helmets, smoke detector, etc..   Dental health: Recommend regular tooth brushing, flossing, and dental visits.       Return in about 3 months (around 01/05/2025) for chronic disease management; nurse visit BP check 2-4 weeks .     Waddell KATHEE Mon, NP

## 2024-10-05 NOTE — Assessment & Plan Note (Signed)
 Blood pressure slightly elevated at 146/97 due to missed medication dose. Home readings are normal. Regular follow-up with cardiology is due. - BP check in 2 weeks - Schedule follow-up with cardiology.

## 2024-10-06 LAB — COMPREHENSIVE METABOLIC PANEL WITH GFR
ALT: 35 U/L (ref 0–35)
AST: 23 U/L (ref 0–37)
Albumin: 3.2 g/dL — ABNORMAL LOW (ref 3.5–5.2)
Alkaline Phosphatase: 47 U/L (ref 39–117)
BUN: 34 mg/dL — ABNORMAL HIGH (ref 6–23)
CO2: 27 meq/L (ref 19–32)
Calcium: 8.6 mg/dL (ref 8.4–10.5)
Chloride: 101 meq/L (ref 96–112)
Creatinine, Ser: 1.84 mg/dL — ABNORMAL HIGH (ref 0.40–1.20)
GFR: 33.47 mL/min — ABNORMAL LOW (ref >60.00)
Glucose, Bld: 195 mg/dL — ABNORMAL HIGH (ref 70–99)
Potassium: 4.9 meq/L (ref 3.5–5.1)
Sodium: 135 meq/L (ref 135–145)
Total Bilirubin: 0.4 mg/dL (ref 0.2–1.2)
Total Protein: 5.8 g/dL — ABNORMAL LOW (ref 6.0–8.3)

## 2024-10-06 LAB — HEMOGLOBIN A1C: Hgb A1c MFr Bld: 7.8 % — ABNORMAL HIGH (ref 4.6–6.5)

## 2024-10-06 LAB — MICROALBUMIN / CREATININE URINE RATIO
Creatinine,U: 135 mg/dL
Microalb Creat Ratio: 3003.9 mg/g — ABNORMAL HIGH (ref 0.0–30.0)
Microalb, Ur: 405.5 mg/dL — ABNORMAL HIGH (ref 0.0–1.9)

## 2024-10-06 LAB — TSH: TSH: 1.85 u[IU]/mL (ref 0.35–5.50)

## 2024-10-07 ENCOUNTER — Ambulatory Visit: Payer: Self-pay | Admitting: Family Medicine

## 2024-10-08 DIAGNOSIS — Z794 Long term (current) use of insulin: Secondary | ICD-10-CM | POA: Diagnosis not present

## 2024-10-08 DIAGNOSIS — H35353 Cystoid macular degeneration, bilateral: Secondary | ICD-10-CM | POA: Diagnosis not present

## 2024-10-08 DIAGNOSIS — E113513 Type 2 diabetes mellitus with proliferative diabetic retinopathy with macular edema, bilateral: Secondary | ICD-10-CM | POA: Diagnosis not present

## 2024-10-08 DIAGNOSIS — H35043 Retinal micro-aneurysms, unspecified, bilateral: Secondary | ICD-10-CM | POA: Diagnosis not present

## 2024-10-08 LAB — HM DIABETES EYE EXAM

## 2024-10-27 ENCOUNTER — Encounter: Payer: Self-pay | Admitting: Family Medicine

## 2024-10-27 ENCOUNTER — Ambulatory Visit

## 2024-10-28 ENCOUNTER — Ambulatory Visit: Payer: Self-pay

## 2024-10-28 NOTE — Telephone Encounter (Signed)
 FYI Only or Action Required?: FYI only for provider: ED advised.  Patient was last seen in primary care on 10/05/2024 by Almarie Waddell NOVAK, NP.  Called Nurse Triage reporting Urinary Tract Infection and Migraine.  Symptoms began yesterday.  Interventions attempted: OTC medications: excedrin .  Symptoms are: unchanged.  Triage Disposition: Go to ED Now (Notify PCP), See HCP Within 4 Hours (Or PCP Triage)  Patient/caregiver understands and will follow disposition?: Yes   Copied from CRM [id number]. Topic: Clinical - Red Word Triage >> Oct 28, 2024  3:47 PM Rea ORN wrote: Red Word that prompted transfer to Nurse Triage: Pain with urination, cloudy urine, migraines Reason for Disposition  Side (flank) or lower back pain present  [1] SEVERE headache (e.g., excruciating) AND [2] worst headache of life  Answer Assessment - Initial Assessment Questions Advised ED now.  Patient reports will have someone to take to ED.   1. LOCATION: Where does it hurt?      All over head; usually goes away, but can't get this one to stop, hurting since yesterday. 2. ONSET: When did the headache start? (e.g., minutes, hours, days)      yesterday 3. PATTERN: Does the pain come and go, or has it been constant since it started?     constant 4. SEVERITY: How bad is the pain? and What does it keep you from doing?  (e.g., Scale 1-10; mild, moderate, or severe)     10/10; took Excedrin  5. RECURRENT SYMPTOM: Have you ever had headaches before? If Yes, ask: When was the last time? and What happened that time?      yes 6. CAUSE: What do you think is causing the headache?     unsure 7. MIGRAINE: Have you been diagnosed with migraine headaches? If Yes, ask: Is this headache similar?      yes 8. HEAD INJURY: Has there been any recent injury to your head?      no 9. OTHER SYMPTOMS: Do you have any other symptoms? (e.g., fever, stiff neck, eye pain, sore throat, cold symptoms)     Eye pain,  blurred vision,  Able to touch chin to chest,stand and walk   denies rash, cough congestion, body aches,weakness,numbness, problems with speech  Answer Assessment - Initial Assessment Questions 1. SYMPTOM: What's the main symptom you're concerned about? (e.g., frequency, incontinence)     Urination pain, right flank pain, lower back pain and lower abd pain urge, frequency, cloudy denies odor 2. ONSET: When did the    start?     yesterday 3. PAIN: Is there any pain? If Yes, ask: How bad is it? (Scale: 1-10; mild, moderate, severe)     5/10 constant 4. CAUSE: What do you think is causing the symptoms?     uti 5. OTHER SYMPTOMS: Do you have any other symptoms? (e.g., blood in urine, fever, flank pain, pain with urination)     Denies fever, vomiting, diarrhea reports dizziness, chills, nausea  Migraine: 9/10, behind eyes, back of neck, pressing back of neck, easies pain in head.yesterday Blurred vision constant, denies weakness/ numbness, no diff speaking, standing or walking.  Protocols used: Urinary Symptoms-A-AH, Headache-A-AH

## 2024-11-13 ENCOUNTER — Emergency Department (HOSPITAL_BASED_OUTPATIENT_CLINIC_OR_DEPARTMENT_OTHER)

## 2024-11-13 ENCOUNTER — Other Ambulatory Visit: Payer: Self-pay

## 2024-11-13 ENCOUNTER — Encounter (HOSPITAL_BASED_OUTPATIENT_CLINIC_OR_DEPARTMENT_OTHER): Payer: Self-pay

## 2024-11-13 ENCOUNTER — Emergency Department (HOSPITAL_BASED_OUTPATIENT_CLINIC_OR_DEPARTMENT_OTHER)
Admission: EM | Admit: 2024-11-13 | Discharge: 2024-11-13 | Disposition: A | Discharge status: Home / Self Care | Attending: Emergency Medicine | Admitting: Emergency Medicine

## 2024-11-13 DIAGNOSIS — Z7982 Long term (current) use of aspirin: Secondary | ICD-10-CM | POA: Diagnosis not present

## 2024-11-13 DIAGNOSIS — I2510 Atherosclerotic heart disease of native coronary artery without angina pectoris: Secondary | ICD-10-CM | POA: Diagnosis not present

## 2024-11-13 DIAGNOSIS — Z7901 Long term (current) use of anticoagulants: Secondary | ICD-10-CM | POA: Diagnosis not present

## 2024-11-13 DIAGNOSIS — Z21 Asymptomatic human immunodeficiency virus [HIV] infection status: Secondary | ICD-10-CM | POA: Insufficient documentation

## 2024-11-13 DIAGNOSIS — J449 Chronic obstructive pulmonary disease, unspecified: Secondary | ICD-10-CM | POA: Diagnosis not present

## 2024-11-13 DIAGNOSIS — I509 Heart failure, unspecified: Secondary | ICD-10-CM | POA: Insufficient documentation

## 2024-11-13 DIAGNOSIS — R079 Chest pain, unspecified: Secondary | ICD-10-CM | POA: Diagnosis present

## 2024-11-13 HISTORY — DX: Acute myocardial infarction, unspecified: I21.9

## 2024-11-13 LAB — CBC WITH DIFFERENTIAL/PLATELET
Abs Immature Granulocytes: 0.02 K/uL (ref 0.00–0.07)
Basophils Absolute: 0.1 K/uL (ref 0.0–0.1)
Basophils Relative: 1 %
Eosinophils Absolute: 0.5 K/uL (ref 0.0–0.5)
Eosinophils Relative: 5 %
HCT: 29.1 % — ABNORMAL LOW (ref 36.0–46.0)
Hemoglobin: 10.1 g/dL — ABNORMAL LOW (ref 12.0–15.0)
Immature Granulocytes: 0 %
Lymphocytes Relative: 21 %
Lymphs Abs: 1.8 K/uL (ref 0.7–4.0)
MCH: 33.8 pg (ref 26.0–34.0)
MCHC: 34.7 g/dL (ref 30.0–36.0)
MCV: 97.3 fL (ref 80.0–100.0)
Monocytes Absolute: 0.8 K/uL (ref 0.1–1.0)
Monocytes Relative: 9 %
Neutro Abs: 5.7 K/uL (ref 1.7–7.7)
Neutrophils Relative %: 64 %
Platelets: 220 K/uL (ref 150–400)
RBC: 2.99 MIL/uL — ABNORMAL LOW (ref 3.87–5.11)
RDW: 12.2 % (ref 11.5–15.5)
WBC: 8.8 K/uL (ref 4.0–10.5)
nRBC: 0 % (ref 0.0–0.2)

## 2024-11-13 LAB — RESP PANEL BY RT-PCR (RSV, FLU A&B, COVID)  RVPGX2
Influenza A by PCR: NEGATIVE
Influenza B by PCR: NEGATIVE
Resp Syncytial Virus by PCR: NEGATIVE
SARS Coronavirus 2 by RT PCR: NEGATIVE

## 2024-11-13 LAB — COMPREHENSIVE METABOLIC PANEL WITH GFR
ALT: 26 U/L (ref 0–44)
AST: 29 U/L (ref 15–41)
Albumin: 3.1 g/dL — ABNORMAL LOW (ref 3.5–5.0)
Alkaline Phosphatase: 47 U/L (ref 38–126)
Anion gap: 9 (ref 5–15)
BUN: 30 mg/dL — ABNORMAL HIGH (ref 6–20)
CO2: 21 mmol/L — ABNORMAL LOW (ref 22–32)
Calcium: 7.9 mg/dL — ABNORMAL LOW (ref 8.9–10.3)
Chloride: 106 mmol/L (ref 98–111)
Creatinine, Ser: 1.9 mg/dL — ABNORMAL HIGH (ref 0.44–1.00)
GFR, Estimated: 33 mL/min — ABNORMAL LOW
Glucose, Bld: 291 mg/dL — ABNORMAL HIGH (ref 70–99)
Potassium: 4.5 mmol/L (ref 3.5–5.1)
Sodium: 135 mmol/L (ref 135–145)
Total Bilirubin: 0.2 mg/dL (ref 0.0–1.2)
Total Protein: 5.3 g/dL — ABNORMAL LOW (ref 6.5–8.1)

## 2024-11-13 LAB — LIPASE, BLOOD: Lipase: 52 U/L — ABNORMAL HIGH (ref 11–51)

## 2024-11-13 LAB — TROPONIN T, HIGH SENSITIVITY
Troponin T High Sensitivity: 20 ng/L — ABNORMAL HIGH (ref 0–19)
Troponin T High Sensitivity: 16 ng/L (ref 0–19)

## 2024-11-13 LAB — PRO BRAIN NATRIURETIC PEPTIDE: Pro Brain Natriuretic Peptide: 62.2 pg/mL

## 2024-11-13 MED ORDER — NITROGLYCERIN 0.4 MG SL SUBL
0.4000 mg | SUBLINGUAL_TABLET | Freq: Once | SUBLINGUAL | Status: AC
  Administered 2024-11-13: 0.4 mg via SUBLINGUAL
  Filled 2024-11-13: qty 1

## 2024-11-13 MED ORDER — MORPHINE SULFATE (PF) 4 MG/ML IV SOLN
4.0000 mg | Freq: Once | INTRAVENOUS | Status: AC
  Administered 2024-11-13: 4 mg via INTRAVENOUS
  Filled 2024-11-13: qty 1

## 2024-11-13 NOTE — ED Triage Notes (Addendum)
 Pt arrives via [facility]. States that she began having chest pain during the night. States that she took 5 nitroglycerin  pills since it began. Also states that she has some shortness of breath.  Pain radiates to left breast and down her back.  Pt also took 324 mg of aspirin .

## 2024-11-13 NOTE — ED Notes (Signed)
 ED Provider at bedside.

## 2024-11-13 NOTE — ED Provider Notes (Signed)
 " [facility] AT [facility] Provider Note   CSN: [id number] Arrival date & time: 11/13/24  9197     Patient presents with: Chest Pain   Renee Bryant is a 43 y.o. female.   Patient here chest pain.  Occurred about 6 hours ago that woke her from her sleep.  She took nitroglycerin  which helped her pain for an hour and then it came back.  She is taken for additional dose of the nitroglycerin  with improvement each time.  She felt little breathless as well.  She denies any major symptoms like this prior to today.  She uses her nitroglycerin  maybe once a month.  She has history of CAD, COPD HIV CHF.  She does not endorse any cough fever or chills.  She does not feel like the pain is reproducible with anything.  Pain is mostly in the left upper chest somewhat goes to her left upper back left upper arm.  She has had her gallbladder removed in the past.  She is a former smoker.  The history is provided by the patient.       Prior to Admission medications  Medication Sig Start Date End Date Taking? Authorizing Provider  albuterol  (VENTOLIN  HFA) 108 (90 Base) MCG/ACT inhaler Inhale 1-2 puffs into the lungs every 6 (six) hours as needed for wheezing or shortness of breath. 07/22/23   Paz, Jose E, MD  aspirin  81 MG chewable tablet Chew 1 tablet (81 mg total) by mouth daily. 05/03/23   Danford, Lonni SQUIBB, MD  atorvastatin  (LIPITOR ) 80 MG tablet TAKE 1 TABLET (80 MG TOTAL) BY MOUTH DAILY. 07/24/24   Almarie Waddell NOVAK, NP  bictegravir-emtricitabine -tenofovir  AF (BIKTARVY ) 50-200-25 MG TABS tablet Take 1 tablet by mouth at bedtime.    [provider]  carvedilol  (COREG ) 3.125 MG tablet TAKE 1 TABLET (3.125 MG TOTAL) BY MOUTH 2 (TWO) TIMES DAILY WITH A MEAL. 08/31/24   Loistine Sober, NP  Cholecalciferol (VITAMIN D3) 50 MCG (2000 UT) TABS Take 2,000 Units by mouth daily.    [provider]  clonazePAM  (KLONOPIN ) 1 MG tablet Take 1 mg by mouth in the  morning and at bedtime.    [provider]  clopidogrel  (PLAVIX ) 75 MG tablet TAKE 1 TABLET (75 MG TOTAL) BY MOUTH DAILY. 08/31/24   O'NealDarryle Ned, MD  Continuous Glucose Sensor (FREESTYLE LIBRE 3 PLUS SENSOR) MISC Inject 1 Device into the skin See admin instructions. Place 1 new sensor into the skin every 15 days    [provider]  Continuous Glucose Sensor (FREESTYLE LIBRE 3 SENSOR) MISC Use to check blood glucose continuously. 10/22/23   Almarie Waddell NOVAK, NP  ENTRESTO  24-26 MG TAKE ONE TABLET BY MOUTH TWICE DAILY 09/23/24   O'Neal, Darryle Ned, MD  [Paused] Evolocumab  (REPATHA  SURECLICK) 140 MG/ML SOAJ Inject 140 mg into the skin every 14 (fourteen) days. Wait to take this until your doctor or other care provider tells you to start again. 10/16/23   Barbaraann Darryle Ned, MD  EXCEDRIN  EXTRA STRENGTH 250-250-65 MG tablet Take 1-2 tablets by mouth every 6 (six) hours as needed for headache or migraine.    [provider]  famotidine  (PEPCID ) 20 MG tablet Take 20 mg by mouth 2 (two) times daily before a meal.    [provider]  furosemide  (LASIX ) 20 MG tablet Take 1 tablet (20 mg total) by mouth daily as needed for edema or fluid. 11/19/23   Almarie Waddell NOVAK, NP  Insulin   Pen Needle 32G X 4 MM MISC Use 3 times a day with Humalog  Kwik pen 07/22/23   Amon Aloysius BRAVO, MD  Insulin  Syringe-Needle U-100 (GNP INSULIN  SYRINGES 31GX5/16) 31G X 5/16 0.3 ML MISC Use to inject Lantus  once a day 07/22/23   Paz, Jose E, MD  lansoprazole  (PREVACID  SOLUTAB) 30 MG disintegrating tablet Take 1 tablet (30 mg total) by mouth daily at 12 noon. Patient taking differently: Take 30 mg by mouth See admin instructions. Dissolve 30 mg in the mouth in the afternoon 03/31/24   Almarie Waddell NOVAK, NP  LANTUS  SOLOSTAR 100 UNIT/ML Solostar Pen Inject 40 Units into the skin at bedtime.    [provider]  MOUNJARO  5 MG/0.5ML Pen Inject 5 mg into the skin every Tuesday.    [provider]  nitroGLYCERIN  (NITROSTAT ) 0.4 MG SL tablet Place 1 tablet (0.4 mg total) under the tongue every 5 (five) minutes as needed for chest pain. 08/26/23 10/05/24  Loistine Sober, NP  ranolazine  (RANEXA ) 500 MG 12 hr tablet TAKE 1 TABLET (500 MG TOTAL) BY MOUTH 2 (TWO) TIMES DAILY. 09/11/24   O'NealDarryle Ned, MD  valACYclovir (VALTREX) 1000 MG tablet Take 1,000 mg by mouth daily as needed (for flares- AS DIRECTED).    [provider]  Vitamin D , Ergocalciferol , (DRISDOL ) 1.25 MG (50000 UNIT) CAPS capsule TAKE 1 CAPSULE ONCE WEEKLY Patient taking differently: Take 50,000 Units by mouth See admin instructions. Take 50,000 units by mouth on Tuesdays and Thursdays 02/11/24   Almarie Waddell NOVAK, NP    Allergies: Ozempic (0.25 or 0.5 mg-dose) [semaglutide(0.25 or 0.5mg -dos)], Jardiance [empagliflozin], Metformin and related, Pantoprazole , Wellbutrin [bupropion], and Trulicity [dulaglutide]    Review of Systems  Updated Vital Signs BP 122/84   Pulse 81   Temp 98.5 F (36.9 C) (Oral)   Resp (!) 21   Ht 5' 7 (1.702 m)   Wt 90.7 kg   LMP 11/01/2024 (Approximate)   SpO2 99%   BMI 31.32 kg/m   Physical Exam Vitals and nursing note reviewed.  Constitutional:      General: She is not in acute distress.    Appearance: She is well-developed. She is not ill-appearing.  HENT:     Head: Normocephalic and atraumatic.  Eyes:     Extraocular Movements: Extraocular movements intact.     Conjunctiva/sclera: Conjunctivae normal.     Pupils: Pupils are equal, round, and reactive to light.  Cardiovascular:     Rate and Rhythm: Normal rate and regular rhythm.     Pulses:          Radial pulses are 2+ on the right side and 2+ on the left side.     Heart sounds: Normal heart sounds. No murmur heard. Pulmonary:     Effort: Pulmonary effort is normal. No respiratory distress.     Breath sounds: Normal breath sounds.  Abdominal:     Palpations: Abdomen is soft.     Tenderness: There is no  abdominal tenderness.  Musculoskeletal:        General: No swelling. Normal range of motion.     Cervical back: Normal range of motion and neck supple.     Right lower leg: No edema.     Left lower leg: No edema.  Skin:    General: Skin is warm and dry.     Capillary Refill: Capillary refill takes less than 2 seconds.  Neurological:     Mental Status: She is alert.  Psychiatric:  Mood and Affect: Mood normal.     (all labs ordered are listed, but only abnormal results are displayed) Labs Reviewed  CBC WITH DIFFERENTIAL/PLATELET - Abnormal; Notable for the following components:      Result Value   RBC 2.99 (*)    Hemoglobin 10.1 (*)    HCT 29.1 (*)    All other components within normal limits  COMPREHENSIVE METABOLIC PANEL WITH GFR - Abnormal; Notable for the following components:   CO2 21 (*)    Glucose, Bld 291 (*)    BUN 30 (*)    Creatinine, Ser 1.90 (*)    Calcium  7.9 (*)    Total Protein 5.3 (*)    Albumin 3.1 (*)    GFR, Estimated 33 (*)    All other components within normal limits  LIPASE, BLOOD - Abnormal; Notable for the following components:   Lipase 52 (*)    All other components within normal limits  TROPONIN T, HIGH SENSITIVITY - Abnormal; Notable for the following components:   Troponin T High Sensitivity 20 (*)    All other components within normal limits  RESP PANEL BY RT-PCR (RSV, FLU A&B, COVID)  RVPGX2  PRO BRAIN NATRIURETIC PEPTIDE  TROPONIN T, HIGH SENSITIVITY    EKG: EKG Interpretation Date/Time:  Friday November 13 2024 08:58:28 EST Ventricular Rate:  104 PR Interval:  163 QRS Duration:  90 QT Interval:  357 QTC Calculation: 470 R Axis:   41  Text Interpretation: Sinus tachycardia Confirmed by Ruthe Cornet [phone]) on 11/13/2024 9:11:50 AM  Radiology: [facility] Chest Portable 1 View Result Date: 11/13/2024 EXAM: 1 VIEW(S) XRAY OF THE CHEST 11/13/2024 08:22:00 AM COMPARISON: 05/21/2024 CLINICAL HISTORY: cp FINDINGS: LUNGS AND PLEURA:  No focal pulmonary opacity. No pleural effusion. No pneumothorax. HEART AND MEDIASTINUM: No acute abnormality of the cardiac and mediastinal silhouettes. BONES AND SOFT TISSUES: No acute osseous abnormality. IMPRESSION: 1. No acute process. Electronically signed by: Norleen Boxer MD 11/13/2024 09:15 AM EST RP Workstation: [id number]     Procedures   Medications Ordered in the ED  morphine  (PF) 4 MG/ML injection 4 mg (4 mg Intravenous Given 11/13/24 0844)  nitroGLYCERIN  (NITROSTAT ) SL tablet 0.4 mg (0.4 mg Sublingual Given 11/13/24 0910)                                    Medical Decision Making Amount and/or Complexity of Data Reviewed Labs: ordered. Radiology: ordered.  Risk Prescription drug management.   Kyriaki Moder is here with chest pain.  History of CAD, HIV, asthma/COPD, CHF.  EKG shows sinus rhythm.  No ischemic changes per my review interpretation of EKG.  Patient woke up with chest pain about 6 hours ago improvement with nitroglycerin  x 5.  Still having discomfort now but overall reassuring EKG.  Not really reproducible pain on exam.  No fever or cough.  No recent surgery or travel.  Former smoker.  Differential diagnosis could be ACS versus muscular process versus less likely infectious process or GI related process.  She is pretty comfortable and doubt dissection.  She is PERC negative and doubt PE.  Overall we will get CBC CMP lipase troponin chest x-ray and reevaluate.  She is taking nitroglycerin  5 times with improvement will give her some morphine  and see how she does.  Overall troponin 20 and 16 on recheck.  proBNP is normal.  Negative for COVID and flu and RSV.  No significant  leukocytosis anemia or electrolyte abnormality.  Creatinine at baseline.  Does seem like pain is now resolved.  She does have CAD history.  Seems like nitroglycerin  might have helped.  Patient does not really want to stay for any further workup but I want to talk about her with cardiology to see if  I can make sure she gets close follow-up or see if they would like to have her admitted.  Talk with Dr. Floretta w/ cardiology.  Overall reviewed labs and imaging and catheterization recently.  Patient is chest pain-free now.  Reassuring EKG and troponins.  Mild nonobstructive disease on catheterization fairly recently last year.  Overall shared decision was made to discharge the patient as this is what she wanted and overall we have a reassuring workup today.  She will follow-up closely with her cardiology team outpatient and understands to return if symptoms worsen or intensify.  Discharge.  This chart was dictated using voice recognition software.  Despite best efforts to proofread,  errors can occur which can change the documentation meaning.      Final diagnoses:  Nonspecific chest pain    ED Discharge Orders     None          Ruthe Cornet, DO 11/13/24 1211  "

## 2024-11-13 NOTE — ED Notes (Addendum)
 Pt verbalizes that family doesn't know her HIV status and doesn't want it discussed in front of others.

## 2024-11-13 NOTE — Discharge Instructions (Signed)
 Return if symptoms worsen as discussed.  Follow-up with your cardiology team next week and your primary care team.

## 2024-11-13 NOTE — ED Notes (Signed)
 Pt states that pain is better after 1 tab of the nitroglycerin . Pain decreased from 9/10 to 6/10

## 2024-11-14 ENCOUNTER — Inpatient Hospital Stay (HOSPITAL_BASED_OUTPATIENT_CLINIC_OR_DEPARTMENT_OTHER)
Admission: EM | Admit: 2024-11-14 | Discharge: 2024-11-18 | DRG: 321 | Disposition: A | Discharge status: Home / Self Care | Attending: Internal Medicine | Admitting: Internal Medicine

## 2024-11-14 ENCOUNTER — Encounter (HOSPITAL_BASED_OUTPATIENT_CLINIC_OR_DEPARTMENT_OTHER): Payer: Self-pay | Admitting: Emergency Medicine

## 2024-11-14 ENCOUNTER — Emergency Department (HOSPITAL_BASED_OUTPATIENT_CLINIC_OR_DEPARTMENT_OTHER)

## 2024-11-14 DIAGNOSIS — E119 Type 2 diabetes mellitus without complications: Secondary | ICD-10-CM

## 2024-11-14 DIAGNOSIS — I129 Hypertensive chronic kidney disease with stage 1 through stage 4 chronic kidney disease, or unspecified chronic kidney disease: Secondary | ICD-10-CM

## 2024-11-14 DIAGNOSIS — I252 Old myocardial infarction: Secondary | ICD-10-CM

## 2024-11-14 DIAGNOSIS — I25110 Atherosclerotic heart disease of native coronary artery with unstable angina pectoris: Secondary | ICD-10-CM | POA: Diagnosis not present

## 2024-11-14 DIAGNOSIS — Z955 Presence of coronary angioplasty implant and graft: Secondary | ICD-10-CM

## 2024-11-14 DIAGNOSIS — I200 Unstable angina: Secondary | ICD-10-CM | POA: Insufficient documentation

## 2024-11-14 DIAGNOSIS — N1832 Chronic kidney disease, stage 3b: Secondary | ICD-10-CM | POA: Diagnosis not present

## 2024-11-14 DIAGNOSIS — Z8249 Family history of ischemic heart disease and other diseases of the circulatory system: Secondary | ICD-10-CM

## 2024-11-14 DIAGNOSIS — Z8679 Personal history of other diseases of the circulatory system: Secondary | ICD-10-CM

## 2024-11-14 DIAGNOSIS — R079 Chest pain, unspecified: Principal | ICD-10-CM | POA: Diagnosis present

## 2024-11-14 DIAGNOSIS — J4489 Other specified chronic obstructive pulmonary disease: Secondary | ICD-10-CM | POA: Diagnosis present

## 2024-11-14 DIAGNOSIS — I214 Non-ST elevation (NSTEMI) myocardial infarction: Secondary | ICD-10-CM | POA: Diagnosis not present

## 2024-11-14 DIAGNOSIS — Z7982 Long term (current) use of aspirin: Secondary | ICD-10-CM

## 2024-11-14 DIAGNOSIS — I10 Essential (primary) hypertension: Secondary | ICD-10-CM | POA: Diagnosis present

## 2024-11-14 DIAGNOSIS — Z79899 Other long term (current) drug therapy: Secondary | ICD-10-CM

## 2024-11-14 DIAGNOSIS — Z9049 Acquired absence of other specified parts of digestive tract: Secondary | ICD-10-CM

## 2024-11-14 DIAGNOSIS — E1165 Type 2 diabetes mellitus with hyperglycemia: Secondary | ICD-10-CM

## 2024-11-14 DIAGNOSIS — Z7902 Long term (current) use of antithrombotics/antiplatelets: Secondary | ICD-10-CM

## 2024-11-14 DIAGNOSIS — Z9861 Coronary angioplasty status: Secondary | ICD-10-CM

## 2024-11-14 DIAGNOSIS — I209 Angina pectoris, unspecified: Secondary | ICD-10-CM

## 2024-11-14 DIAGNOSIS — K219 Gastro-esophageal reflux disease without esophagitis: Secondary | ICD-10-CM | POA: Diagnosis present

## 2024-11-14 DIAGNOSIS — E782 Mixed hyperlipidemia: Secondary | ICD-10-CM | POA: Diagnosis present

## 2024-11-14 DIAGNOSIS — E1122 Type 2 diabetes mellitus with diabetic chronic kidney disease: Secondary | ICD-10-CM

## 2024-11-14 DIAGNOSIS — Z6831 Body mass index (BMI) 31.0-31.9, adult: Secondary | ICD-10-CM

## 2024-11-14 DIAGNOSIS — J439 Emphysema, unspecified: Secondary | ICD-10-CM | POA: Diagnosis present

## 2024-11-14 DIAGNOSIS — I2510 Atherosclerotic heart disease of native coronary artery without angina pectoris: Secondary | ICD-10-CM | POA: Diagnosis present

## 2024-11-14 DIAGNOSIS — Z87891 Personal history of nicotine dependence: Secondary | ICD-10-CM

## 2024-11-14 DIAGNOSIS — Z794 Long term (current) use of insulin: Secondary | ICD-10-CM | POA: Diagnosis not present

## 2024-11-14 DIAGNOSIS — E669 Obesity, unspecified: Secondary | ICD-10-CM | POA: Diagnosis present

## 2024-11-14 DIAGNOSIS — F32A Depression, unspecified: Secondary | ICD-10-CM | POA: Diagnosis present

## 2024-11-14 DIAGNOSIS — I5023 Acute on chronic systolic (congestive) heart failure: Secondary | ICD-10-CM | POA: Diagnosis present

## 2024-11-14 DIAGNOSIS — I130 Hypertensive heart and chronic kidney disease with heart failure and stage 1 through stage 4 chronic kidney disease, or unspecified chronic kidney disease: Secondary | ICD-10-CM | POA: Diagnosis present

## 2024-11-14 DIAGNOSIS — N179 Acute kidney failure, unspecified: Secondary | ICD-10-CM | POA: Diagnosis present

## 2024-11-14 DIAGNOSIS — Z21 Asymptomatic human immunodeficiency virus [HIV] infection status: Secondary | ICD-10-CM | POA: Diagnosis present

## 2024-11-14 LAB — BASIC METABOLIC PANEL WITH GFR
Anion gap: 9 (ref 5–15)
BUN: 37 mg/dL — ABNORMAL HIGH (ref 6–20)
CO2: 23 mmol/L (ref 22–32)
Calcium: 8.3 mg/dL — ABNORMAL LOW (ref 8.9–10.3)
Chloride: 105 mmol/L (ref 98–111)
Creatinine, Ser: 2.08 mg/dL — ABNORMAL HIGH (ref 0.44–1.00)
GFR, Estimated: 30 mL/min — ABNORMAL LOW
Glucose, Bld: 209 mg/dL — ABNORMAL HIGH (ref 70–99)
Potassium: 4.3 mmol/L (ref 3.5–5.1)
Sodium: 137 mmol/L (ref 135–145)

## 2024-11-14 LAB — CBC
HCT: 30.8 % — ABNORMAL LOW (ref 36.0–46.0)
Hemoglobin: 10.6 g/dL — ABNORMAL LOW (ref 12.0–15.0)
MCH: 33.3 pg (ref 26.0–34.0)
MCHC: 34.4 g/dL (ref 30.0–36.0)
MCV: 96.9 fL (ref 80.0–100.0)
Platelets: 233 K/uL (ref 150–400)
RBC: 3.18 MIL/uL — ABNORMAL LOW (ref 3.87–5.11)
RDW: 12.5 % (ref 11.5–15.5)
WBC: 9.8 K/uL (ref 4.0–10.5)
nRBC: 0 % (ref 0.0–0.2)

## 2024-11-14 LAB — HCG, SERUM, QUALITATIVE: Preg, Serum: NEGATIVE

## 2024-11-14 LAB — TROPONIN T, HIGH SENSITIVITY
Troponin T High Sensitivity: 21 ng/L — ABNORMAL HIGH (ref 0–19)
Troponin T High Sensitivity: 24 ng/L — ABNORMAL HIGH (ref 0–19)

## 2024-11-14 LAB — GLUCOSE, CAPILLARY
Glucose-Capillary: 216 mg/dL — ABNORMAL HIGH (ref 70–99)
Glucose-Capillary: 170 mg/dL — ABNORMAL HIGH (ref 70–99)
Glucose-Capillary: 189 mg/dL — ABNORMAL HIGH (ref 70–99)

## 2024-11-14 LAB — HEPARIN LEVEL (UNFRACTIONATED): Heparin Unfractionated: 0.19 [IU]/mL — ABNORMAL LOW (ref 0.30–0.70)

## 2024-11-14 LAB — CBG MONITORING, ED: Glucose-Capillary: 251 mg/dL — ABNORMAL HIGH (ref 70–99)

## 2024-11-14 MED ORDER — FAMOTIDINE 20 MG PO TABS
20.0000 mg | ORAL_TABLET | Freq: Two times a day (BID) | ORAL | Status: AC
  Administered 2024-11-14 – 2024-11-18 (×8): 20 mg via ORAL
  Filled 2024-11-14 (×8): qty 1

## 2024-11-14 MED ORDER — ACETAMINOPHEN 650 MG RE SUPP: 650.0000 mg | Freq: Four times a day (QID) | RECTAL | Status: DC | PRN

## 2024-11-14 MED ORDER — NITROGLYCERIN IN D5W 200-5 MCG/ML-% IV SOLN
0.0000 ug/min | INTRAVENOUS | Status: DC
  Administered 2024-11-14: 23 ug/min via INTRAVENOUS
  Administered 2024-11-14: 5 ug/min via INTRAVENOUS
  Administered 2024-11-16: 25 ug/min via INTRAVENOUS
  Administered 2024-11-17: 5 ug/min via INTRAVENOUS
  Filled 2024-11-14 (×3): qty 250

## 2024-11-14 MED ORDER — HYDRALAZINE HCL 20 MG/ML IJ SOLN: 5.0000 mg | Freq: Four times a day (QID) | INTRAMUSCULAR | Status: AC | PRN

## 2024-11-14 MED ORDER — SACUBITRIL-VALSARTAN 24-26 MG PO TABS
1.0000 | ORAL_TABLET | Freq: Two times a day (BID) | ORAL | Status: DC
  Administered 2024-11-14 – 2024-11-15 (×3): 1 via ORAL
  Filled 2024-11-14 (×4): qty 1

## 2024-11-14 MED ORDER — ALBUTEROL SULFATE (2.5 MG/3ML) 0.083% IN NEBU: 3.0000 mL | INHALATION_SOLUTION | Freq: Four times a day (QID) | RESPIRATORY_TRACT | Status: AC | PRN

## 2024-11-14 MED ORDER — BICTEGRAVIR-EMTRICITAB-TENOFOV 50-200-25 MG PO TABS
1.0000 | ORAL_TABLET | Freq: Every day | ORAL | Status: AC
  Administered 2024-11-14 – 2024-11-17 (×4): 1 via ORAL
  Filled 2024-11-14 (×4): qty 1

## 2024-11-14 MED ORDER — ACETAMINOPHEN 325 MG PO TABS
650.0000 mg | ORAL_TABLET | Freq: Four times a day (QID) | ORAL | Status: AC | PRN
  Administered 2024-11-18: 650 mg via ORAL
  Filled 2024-11-14: qty 2

## 2024-11-14 MED ORDER — HEPARIN BOLUS VIA INFUSION
4000.0000 [IU] | Freq: Once | INTRAVENOUS | Status: AC
  Administered 2024-11-14: 4000 [IU] via INTRAVENOUS

## 2024-11-14 MED ORDER — ALUM & MAG HYDROXIDE-SIMETH 200-200-20 MG/5ML PO SUSP
30.0000 mL | Freq: Once | ORAL | Status: AC
  Administered 2024-11-14: 30 mL via ORAL
  Filled 2024-11-14: qty 30

## 2024-11-14 MED ORDER — MORPHINE SULFATE (PF) 4 MG/ML IV SOLN
4.0000 mg | Freq: Once | INTRAVENOUS | Status: AC
  Administered 2024-11-14: 4 mg via INTRAVENOUS
  Filled 2024-11-14: qty 1

## 2024-11-14 MED ORDER — INSULIN ASPART 100 UNIT/ML IJ SOLN: 0.0000 [IU] | Freq: Every day | INTRAMUSCULAR | Status: DC

## 2024-11-14 MED ORDER — ATORVASTATIN CALCIUM 80 MG PO TABS
80.0000 mg | ORAL_TABLET | Freq: Every day | ORAL | Status: DC
  Administered 2024-11-14 – 2024-11-17 (×4): 80 mg via ORAL
  Filled 2024-11-14 (×4): qty 1

## 2024-11-14 MED ORDER — ONDANSETRON HCL 4 MG/2ML IJ SOLN
4.0000 mg | Freq: Once | INTRAMUSCULAR | Status: AC
  Administered 2024-11-14: 4 mg via INTRAVENOUS
  Filled 2024-11-14: qty 2

## 2024-11-14 MED ORDER — CARVEDILOL 3.125 MG PO TABS
3.1250 mg | ORAL_TABLET | Freq: Two times a day (BID) | ORAL | Status: DC
  Administered 2024-11-14 – 2024-11-18 (×8): 3.125 mg via ORAL
  Filled 2024-11-14 (×8): qty 1

## 2024-11-14 MED ORDER — MORPHINE SULFATE (PF) 4 MG/ML IV SOLN
4.0000 mg | INTRAVENOUS | Status: AC | PRN
  Administered 2024-11-14 – 2024-11-16 (×8): 4 mg via INTRAVENOUS
  Filled 2024-11-14 (×8): qty 1

## 2024-11-14 MED ORDER — HEPARIN (PORCINE) 25000 UT/250ML-% IV SOLN
1400.0000 [IU]/h | INTRAVENOUS | Status: DC
  Administered 2024-11-14: 950 [IU]/h via INTRAVENOUS
  Administered 2024-11-15: 1100 [IU]/h via INTRAVENOUS
  Administered 2024-11-15 – 2024-11-16 (×2): 1400 [IU]/h via INTRAVENOUS
  Filled 2024-11-14 (×5): qty 250

## 2024-11-14 MED ORDER — ONDANSETRON HCL 4 MG PO TABS: 4.0000 mg | ORAL_TABLET | Freq: Four times a day (QID) | ORAL | Status: AC | PRN

## 2024-11-14 MED ORDER — RANOLAZINE ER 500 MG PO TB12
500.0000 mg | ORAL_TABLET | Freq: Two times a day (BID) | ORAL | Status: DC
  Administered 2024-11-14: 500 mg via ORAL
  Filled 2024-11-14: qty 1

## 2024-11-14 MED ORDER — CLOPIDOGREL BISULFATE 75 MG PO TABS
75.0000 mg | ORAL_TABLET | Freq: Every day | ORAL | Status: DC
  Administered 2024-11-14 – 2024-11-18 (×5): 75 mg via ORAL
  Filled 2024-11-14 (×5): qty 1

## 2024-11-14 MED ORDER — MELATONIN 5 MG PO TABS: 5.0000 mg | ORAL_TABLET | Freq: Every evening | ORAL | Status: AC | PRN

## 2024-11-14 MED ORDER — INSULIN ASPART 100 UNIT/ML IJ SOLN
0.0000 [IU] | Freq: Three times a day (TID) | INTRAMUSCULAR | Status: AC
  Administered 2024-11-14: 3 [IU] via SUBCUTANEOUS
  Administered 2024-11-15 (×2): 2 [IU] via SUBCUTANEOUS
  Administered 2024-11-16: 1 [IU] via SUBCUTANEOUS
  Administered 2024-11-16: 2 [IU] via SUBCUTANEOUS
  Filled 2024-11-14: qty 2
  Filled 2024-11-14: qty 1
  Filled 2024-11-14: qty 2
  Filled 2024-11-14: qty 3
  Filled 2024-11-14: qty 2

## 2024-11-14 MED ORDER — INSULIN GLARGINE 100 UNIT/ML ~~LOC~~ SOLN
40.0000 [IU] | Freq: Every day | SUBCUTANEOUS | Status: DC
  Administered 2024-11-14 – 2024-11-17 (×4): 40 [IU] via SUBCUTANEOUS
  Filled 2024-11-14 (×5): qty 0.4

## 2024-11-14 MED ORDER — ONDANSETRON HCL 4 MG/2ML IJ SOLN: 4.0000 mg | Freq: Four times a day (QID) | INTRAMUSCULAR | Status: DC | PRN

## 2024-11-14 MED ORDER — HEPARIN BOLUS VIA INFUSION
2000.0000 [IU] | Freq: Once | INTRAVENOUS | Status: AC
  Administered 2024-11-14: 2000 [IU] via INTRAVENOUS
  Filled 2024-11-14: qty 2000

## 2024-11-14 MED ORDER — RANOLAZINE ER 500 MG PO TB12
1000.0000 mg | ORAL_TABLET | Freq: Two times a day (BID) | ORAL | Status: DC
  Administered 2024-11-14 – 2024-11-18 (×8): 1000 mg via ORAL
  Filled 2024-11-14 (×8): qty 2

## 2024-11-14 MED ORDER — ASPIRIN 81 MG PO CHEW
324.0000 mg | CHEWABLE_TABLET | Freq: Once | ORAL | Status: AC
  Administered 2024-11-14: 324 mg via ORAL
  Filled 2024-11-14: qty 4

## 2024-11-14 NOTE — Progress Notes (Signed)
 ANTICOAGULATION CONSULT NOTE  Pharmacy Consult for Heparin  Indication: chest pain/ACS  Allergies[1]  Patient Measurements: Height: 5' 7 (170.2 cm) Weight: 90.7 kg (199 lb 15.3 oz) IBW/kg (Calculated) : 61.6 Heparin  Dosing Weight: 81.1 kg  Vital Signs: Temp: 98.2 F (36.8 C) (12/20 1354) Temp Source: Oral (12/20 1354) BP: 112/82 (12/20 1215) Pulse Rate: 80 (12/20 1354)  Labs: Recent Labs    11/13/24 0806 11/14/24 0334 11/14/24 1849  HGB 10.1* 10.6*  --   HCT 29.1* 30.8*  --   PLT 220 233  --   HEPARINUNFRC  --   --  0.19*  CREATININE 1.90* 2.08*  --     Estimated Creatinine Clearance: 40.7 mL/min (A) (by C-G formula based on SCr of 2.08 mg/dL (H)).   Medical History: Past Medical History:  Diagnosis Date   Abscess    Asthma    CHF (congestive heart failure) (HCC)    COPD (chronic obstructive pulmonary disease) (HCC)    Coronary artery disease    Diabetes mellitus without complication (HCC)    Emphysema of lung (HCC)    Heart attack (HCC)    HIV (human immunodeficiency virus infection) (HCC)    Hypertension    Pneumonia     Medications:  Medications Prior to Admission  Medication Sig Dispense Refill Last Dose/Taking   aspirin  81 MG chewable tablet Chew 1 tablet (81 mg total) by mouth daily.   11/13/2024   atorvastatin  (LIPITOR ) 80 MG tablet TAKE 1 TABLET (80 MG TOTAL) BY MOUTH DAILY. 90 tablet 1 11/13/2024   bictegravir-emtricitabine -tenofovir  AF (BIKTARVY ) 50-200-25 MG TABS tablet Take 1 tablet by mouth at bedtime.   11/13/2024   albuterol  (VENTOLIN  HFA) 108 (90 Base) MCG/ACT inhaler Inhale 1-2 puffs into the lungs every 6 (six) hours as needed for wheezing or shortness of breath. 1 each 0 More than a month   carvedilol  (COREG ) 3.125 MG tablet TAKE 1 TABLET (3.125 MG TOTAL) BY MOUTH 2 (TWO) TIMES DAILY WITH A MEAL. 180 tablet 2    Cholecalciferol (VITAMIN D3) 50 MCG (2000 UT) TABS Take 2,000 Units by mouth daily.      clonazePAM  (KLONOPIN ) 1 MG tablet Take  1 mg by mouth in the morning and at bedtime.      clopidogrel  (PLAVIX ) 75 MG tablet TAKE 1 TABLET (75 MG TOTAL) BY MOUTH DAILY. 90 tablet 1    Continuous Glucose Sensor (FREESTYLE LIBRE 3 PLUS SENSOR) MISC Inject 1 Device into the skin See admin instructions. Place 1 new sensor into the skin every 15 days      Continuous Glucose Sensor (FREESTYLE LIBRE 3 SENSOR) MISC Use to check blood glucose continuously. 2 each 2    ENTRESTO  24-26 MG TAKE ONE TABLET BY MOUTH TWICE DAILY 180 tablet 3    [Paused] Evolocumab  (REPATHA  SURECLICK) 140 MG/ML SOAJ Inject 140 mg into the skin every 14 (fourteen) days. 2 mL 5    EXCEDRIN  EXTRA STRENGTH 250-250-65 MG tablet Take 1-2 tablets by mouth every 6 (six) hours as needed for headache or migraine.      famotidine  (PEPCID ) 20 MG tablet Take 20 mg by mouth 2 (two) times daily before a meal.      furosemide  (LASIX ) 20 MG tablet Take 1 tablet (20 mg total) by mouth daily as needed for edema or fluid. 30 tablet 3    Insulin  Pen Needle 32G X 4 MM MISC Use 3 times a day with Humalog  Kwik pen 100 each 2    Insulin  Syringe-Needle U-100 (GNP  INSULIN  SYRINGES 31GX5/16) 31G X 5/16 0.3 ML MISC Use to inject Lantus  once a day 100 each 0    lansoprazole  (PREVACID  SOLUTAB) 30 MG disintegrating tablet Take 1 tablet (30 mg total) by mouth daily at 12 noon. (Patient taking differently: Take 30 mg by mouth See admin instructions. Dissolve 30 mg in the mouth in the afternoon) 90 tablet 1    LANTUS  SOLOSTAR 100 UNIT/ML Solostar Pen Inject 40 Units into the skin at bedtime.      MOUNJARO  5 MG/0.5ML Pen Inject 5 mg into the skin every Tuesday.      nitroGLYCERIN  (NITROSTAT ) 0.4 MG SL tablet Place 1 tablet (0.4 mg total) under the tongue every 5 (five) minutes as needed for chest pain. 30 tablet 3    ranolazine  (RANEXA ) 500 MG 12 hr tablet TAKE 1 TABLET (500 MG TOTAL) BY MOUTH 2 (TWO) TIMES DAILY. 180 tablet 3    valACYclovir (VALTREX) 1000 MG tablet Take 1,000 mg by mouth daily as needed  (for flares- AS DIRECTED).      Vitamin D , Ergocalciferol , (DRISDOL ) 1.25 MG (50000 UNIT) CAPS capsule TAKE 1 CAPSULE ONCE WEEKLY (Patient taking differently: Take 50,000 Units by mouth See admin instructions. Take 50,000 units by mouth on Tuesdays and Thursdays) 12 capsule 0    Scheduled:   atorvastatin   80 mg Oral QHS   bictegravir-emtricitabine -tenofovir  AF  1 tablet Oral QHS   carvedilol   3.125 mg Oral BID WC   clopidogrel   75 mg Oral Daily   famotidine   20 mg Oral BID AC   insulin  aspart  0-5 Units Subcutaneous QHS   insulin  aspart  0-9 Units Subcutaneous TID WC   insulin  glargine  40 Units Subcutaneous QHS   ranolazine   1,000 mg Oral BID   sacubitril -valsartan   1 tablet Oral BID   Infusions:   heparin  950 Units/hr (11/14/24 0952)   nitroGLYCERIN  18 mcg/min (11/14/24 1456)   PRN: acetaminophen  **OR** acetaminophen , albuterol , hydrALAZINE , melatonin, morphine  injection, ondansetron  **OR** ondansetron  (ZOFRAN ) IV  Assessment: 59 yof with a history of CAD. Patient is presenting with chest pain. Heparin  per pharmacy consult placed for chest pain/ACS.  Patient is not on anticoagulation prior to arrival.  Hgb 10.6; plt 233  PM update: heparin  level 0.19 is subtherapeutic on 950 units/hr. No issues with the infusion or bleeding reported per RN.  Goal of Therapy:  Heparin  level 0.3-0.7 units/ml Monitor platelets by anticoagulation protocol: Yes   Plan:  Give IV heparin  2000 units bolus x 1 Increase heparin  infusion to 1100 units/hr Check anti-Xa level in 8 hours and daily while on heparin  Continue to monitor H&H and platelets  Rocky Slade, PharmD, BCPS 11/14/2024 7:23 PM  Please check AMION for all [facility] Pharmacy phone numbers After 10:00 PM, call Main Pharmacy [phone]         [1]  Allergies Allergen Reactions   Wellbutrin [Bupropion] Shortness Of Breath    Difficulty breathing   Ozempic (0.25 Or 0.5 Mg-Dose) [Semaglutide(0.25 Or 0.5mg -Dos)] Nausea Only    Jardiance [Empagliflozin] Other (See Comments)    Yeast infections   Metformin And Related Other (See Comments)    Flu-like symptoms, muscle aches, body aches   Pantoprazole  Other (See Comments)    Made the patient feel terrible   Trulicity [Dulaglutide] Rash

## 2024-11-14 NOTE — Assessment & Plan Note (Addendum)
 10/15/2024: A1c was 7.8% Home long-acting insulin  40 units nightly resumed Insulin  SSI with at bedtime coverage, renal dosing ordered Goal inpatient blood glucose levels 140-180

## 2024-11-14 NOTE — Assessment & Plan Note (Addendum)
 Heparin  GTT, telemetry monitoring

## 2024-11-14 NOTE — Assessment & Plan Note (Signed)
 At baseline

## 2024-11-14 NOTE — H&P (Addendum)
 " History and Physical - Telemedicine  Renee Bryant FMW:[id number] DOB: Dec 04, 1980 DOA: 11/14/2024  PCP: Renee Bryant NOVAK, NP  Patient coming from: home via POV  Referring provider: Dr. Geroldine, EDP Telemedicine provider: Dr. Sherre Patient location: [place] at [facility] Referring diagnosis: Chest pain Patient name and DOB verified: Patient was able to verify her first and last name: Renee Bryant, date of birth: [birth year]. Patient consented to Telemedicine Evaluation: yes RN virtual assistant: Quintin Abernethy, RN Video encounter time and date: 11/14/2024 and at approximately: 09:06a  Chief Concern: chest pain  HPI: Ms. Renee Bryant is a 43 year old female with history of HIV compliant with Biktarvy , insulin -dependent diabetes mellitus, obesity, hyperlipidemia, hypertension, history of angina on Ranexa , CAD on dual antiplatelet therapy, COPD, CKD stage IIIb, history of uterine bleeding.  11/14/2024: she presents to the ED for chief concerns of chest pain for 2 to 3 days.  Vitals at the time of my evaluation showed t of 98.4, rr of 17, hr 94, blood pressure 118/80, SpO2 100% on room air.  Serum sodium is 137, potassium 4.3, chloride 105, bicarb 23, BUN of 37, serum creatinine 2.08, eGFR of 30, nonfasting glucose 209, WBC 9.8, hemoglobin 10.6, platelets 233.  High sensitive troponin was 21 and on repeat was 24.  Of note, yesterday patient presented for the same concerns and troponin at that time was 20 and a repeat was 16.  Serum pregnancy was negative.  ED treatment: Aspirin  324 mg p.o. one-time dose, morphine  4 mg IV one-time dose, ondansetron  4 mg IV one-time dose.  12/20: Patient admitted to hospitalist service for chief concerns of chest pain, NSTEMI. ----------------------------- At bedside, via telemedicine encounter, patient was able to confirm her first and last name, her date of birth, the current year, the current location.  Patient reports that she developed chest pain  Thursday, 12/18, evening while laying down.  She reports the pain woke her up from sleep.  She reports throughout Thursday evening, she took 5 nitroglycerin  in order to go back to sleep.  She reports the nitroglycerin  would relieve her pain for about 1 to 2 hours and then the pain would come back. Patient reports that pain radiates to her back.  12/19: Patient presented to the EDt and then was sent home as pain ultimately resolved and with resolving troponin elevation and no proBNP elevation with instructions to follow-up with outpatient cardiology.  Evening of 12/19, patient experienced the same pain with worse intensity.   She describes the pain as stabbing and burning and worse with laying down.  She reports it similar to the pain she felt in 2024 when she had her heart attack.  She denies trauma to her person.  Patient endorses associated shortness of breath.  She endorses compliance with all her medications including Biktarvy , Ranexa  blood pressure medication and insulin .  Social history: She lives at home with her boyfriend.  She is a former tobacco user, quitting in November 2024.  At her peak she was smoking 2 packs of cigarettes per day.  She denies alcohol and IV drug use, and recreational drug use.  Patient states she is using THC medicinally and is being prescribed this by a medical provider. She works as a LAWYER.  ROS: Constitutional: no weight change, no fever ENT/Mouth: no sore throat, no rhinorrhea Eyes: no eye pain, no vision changes Cardiovascular: + chest pain, + dyspnea,  no edema, no palpitations Respiratory: no cough, no sputum, no wheezing Gastrointestinal: no nausea, no vomiting, no diarrhea,  no constipation Genitourinary: no urinary incontinence, no dysuria, no hematuria Musculoskeletal: no arthralgias, no myalgias Skin: no skin lesions, no pruritus, Neuro: n0 weakness, no loss of consciousness, no syncope Psych: no anxiety, no depression, + decrease  appetite Heme/Lymph: no bruising, no bleeding  Assessment/Plan  Principal Problem:   Chest pain Active Problems:   NSTEMI (non-ST elevated myocardial infarction) (HCC)   Cardiac angina   CAD (coronary artery disease)   HIV (human immunodeficiency virus infection) (HCC)   CKD stage 3b, GFR 30-44 ml/min (HCC)   Insulin  dependent type 2 diabetes mellitus (HCC)   Essential hypertension   Mixed hyperlipidemia   Esophageal reflux   Assessment and Plan:  * Chest pain Differential diagnosis included unstable angina versus new onset heart failure given pain is worse when laying down Doubt dissection as patient appears comfortable laying in ED bed. Left heart catheterization in June 2024 is reviewed Nitroglycerin  gtt. initiated on admission to maintain SBP > 100 mmHg Heparin  GTT per pharmacy initiated Morphine  4 mg IV every 4 hours as needed for severe pain not relieved nitroglycerin  gtt., 2 days ordered Complete echo ordered Update (approximately 14:30): cardiology consultation has been paged, Dr. Sheena is aware.  CAD (coronary artery disease) Patient has been compliant with home aspirin  and Plavix  Atorvastatin  80 mg nightly, Coreg  3.125 mg p.o. twice daily with meals, Ranexa  p.o. twice daily, Entresto  twice daily, Plavix  75 mg daily were resumed on admission  Cardiac angina Patient experiences angina at baseline Ranexa  500 p.o. twice daily resumed  NSTEMI (non-ST elevated myocardial infarction) (HCC) Heparin  GTT, telemetry monitoring  Insulin  dependent type 2 diabetes mellitus (HCC) 10/15/2024: A1c was 7.8% Home long-acting insulin  40 units nightly resumed Insulin  SSI with at bedtime coverage, renal dosing ordered Goal inpatient blood glucose levels 140-180  CKD stage 3b, GFR 30-44 ml/min (HCC) At baseline  HIV (human immunodeficiency virus infection) (HCC) Home Biktarvy  nightly resumed  Esophageal reflux Home famotidine  20 mg p.o. twice daily before meals  Mixed  hyperlipidemia Home atorvastatin  80 mg nightly  Essential hypertension Home Entresto  24-26 mg twice daily, Coreg  3.125 mg with meals were resumed Hydralazine  5 mg IV every 6 hours as needed for SBP greater 170, 5 days ordered  Chart reviewed.   05/03/2023 left heart catheterization recommended aggressive medical therapy, as lesions were not amendable to stenting.  03/30/2024: Echo showed estimated ejection fraction of 55 to 60%.  DVT prophylaxis: Heparin  GTT Code Status: Full code Diet: Heart healthy/carb modified Family Communication: No Disposition Plan: Pending clinical course Consults called: Pharmacy, patient will need cardiology consultation upon arrival to hospital/medical floor Admission status: Telemetry  Past Medical History:  Diagnosis Date   Abscess    Asthma    CHF (congestive heart failure) (HCC)    COPD (chronic obstructive pulmonary disease) (HCC)    Coronary artery disease    Diabetes mellitus without complication (HCC)    Emphysema of lung (HCC)    Heart attack (HCC)    HIV (human immunodeficiency virus infection) (HCC)    Hypertension    Pneumonia    Past Surgical History:  Procedure Laterality Date   CESAREAN SECTION     CHOLECYSTECTOMY     FOOT SURGERY Left    LEFT HEART CATH AND CORONARY ANGIOGRAPHY N/A 05/03/2023   Procedure: LEFT HEART CATH AND CORONARY ANGIOGRAPHY;  Surgeon: Jordan, Peter M, MD;  Location: [facility] IN[facility];  Service: Cardiovascular;  Laterality: N/A;   TUBAL LIGATION     Social History:  reports that she  has quit smoking. Her smoking use included cigarettes. She started smoking about 31 years ago. She has a 31.2 pack-year smoking history. She has never used smokeless tobacco. She reports that she does not drink alcohol and does not use drugs.  Allergies[1] Family History  Problem Relation Age of Onset   Hypertension Mother    Hypertension Father    Hypertension Maternal Grandfather    Family history: Family history reviewed  and not pertinent.  Prior to Admission medications  Medication Sig Start Date End Date Taking? Authorizing Provider  albuterol  (VENTOLIN  HFA) 108 (90 Base) MCG/ACT inhaler Inhale 1-2 puffs into the lungs every 6 (six) hours as needed for wheezing or shortness of breath. 07/22/23   Amon Aloysius BRAVO, MD  aspirin  81 MG chewable tablet Chew 1 tablet (81 mg total) by mouth daily. 05/03/23   Danford, Lonni SQUIBB, MD  atorvastatin  (LIPITOR ) 80 MG tablet TAKE 1 TABLET (80 MG TOTAL) BY MOUTH DAILY. 07/24/24   Renee Bryant NOVAK, NP  bictegravir-emtricitabine -tenofovir  AF (BIKTARVY ) 50-200-25 MG TABS tablet Take 1 tablet by mouth at bedtime.    [provider]  carvedilol  (COREG ) 3.125 MG tablet TAKE 1 TABLET (3.125 MG TOTAL) BY MOUTH 2 (TWO) TIMES DAILY WITH A MEAL. 08/31/24   Loistine Sober, NP  Cholecalciferol (VITAMIN D3) 50 MCG (2000 UT) TABS Take 2,000 Units by mouth daily.    [provider]  clonazePAM  (KLONOPIN ) 1 MG tablet Take 1 mg by mouth in the morning and at bedtime.    [provider]  clopidogrel  (PLAVIX ) 75 MG tablet TAKE 1 TABLET (75 MG TOTAL) BY MOUTH DAILY. 08/31/24   O'NealDarryle Ned, MD  Continuous Glucose Sensor (FREESTYLE LIBRE 3 PLUS SENSOR) MISC Inject 1 Device into the skin See admin instructions. Place 1 new sensor into the skin every 15 days    [provider]  Continuous Glucose Sensor (FREESTYLE LIBRE 3 SENSOR) MISC Use to check blood glucose continuously. 10/22/23   Renee Bryant NOVAK, NP  ENTRESTO  24-26 MG TAKE ONE TABLET BY MOUTH TWICE DAILY 09/23/24   O'Neal, Darryle Ned, MD  [Paused] Evolocumab  (REPATHA  SURECLICK) 140 MG/ML SOAJ Inject 140 mg into the skin every 14 (fourteen) days. Wait to take this until your doctor or other care provider tells you to start again. 10/16/23   O'NealDarryle Ned, MD  EXCEDRIN  EXTRA STRENGTH 250-250-65 MG tablet Take 1-2 tablets by mouth every 6 (six) hours as needed for headache or migraine.    [provider]  famotidine  (PEPCID ) 20 MG tablet Take 20 mg by mouth 2 (two) times daily before a meal.    [provider]  furosemide  (LASIX ) 20 MG tablet Take 1 tablet (20 mg total) by mouth daily as needed for edema or fluid. 11/19/23   Renee Bryant NOVAK, NP  Insulin  Pen Needle 32G X 4 MM MISC Use 3 times a day with Humalog  Kwik pen 07/22/23   Amon Aloysius BRAVO, MD  Insulin  Syringe-Needle U-100 (GNP INSULIN  SYRINGES 31GX5/16) 31G X 5/16 0.3 ML MISC Use to inject Lantus  once a day 07/22/23   Paz, Jose E, MD  lansoprazole  (PREVACID  SOLUTAB) 30 MG disintegrating tablet Take 1 tablet (30 mg total) by mouth daily at 12 noon. Patient taking differently: Take 30 mg by mouth See admin instructions. Dissolve 30 mg in the mouth in the afternoon 03/31/24   Renee Bryant NOVAK, NP  LANTUS  SOLOSTAR 100 UNIT/ML Solostar Pen Inject 40 Units into the skin at bedtime.  [provider]  MOUNJARO  5 MG/0.5ML Pen Inject 5 mg into the skin every Tuesday.    [provider]  nitroGLYCERIN  (NITROSTAT ) 0.4 MG SL tablet Place 1 tablet (0.4 mg total) under the tongue every 5 (five) minutes as needed for chest pain. 08/26/23 10/05/24  Loistine Sober, NP  ranolazine  (RANEXA ) 500 MG 12 hr tablet TAKE 1 TABLET (500 MG TOTAL) BY MOUTH 2 (TWO) TIMES DAILY. 09/11/24   O'NealDarryle Ned, MD  valACYclovir (VALTREX) 1000 MG tablet Take 1,000 mg by mouth daily as needed (for flares- AS DIRECTED).    [provider]  Vitamin D , Ergocalciferol , (DRISDOL ) 1.25 MG (50000 UNIT) CAPS capsule TAKE 1 CAPSULE ONCE WEEKLY Patient taking differently: Take 50,000 Units by mouth See admin instructions. Take 50,000 units by mouth on Tuesdays and Thursdays 02/11/24   Renee Bryant NOVAK, NP   Physical Exam completed with assistance of: Anissa Rayfield, RN, who was at bedside during this portion of the exam:  Vitals:   11/14/24 1130 11/14/24 1145 11/14/24 1215 11/14/24 1354  BP: 110/74 104/74 112/82   Pulse: 81 77 84 80   Resp: 15 15 18 20   Temp:    98.2 F (36.8 C)  TempSrc:    Oral  SpO2: 94% 95% 93% 100%  Weight:      Height:       Constitutional: appears age-appropriate, anxious Eyes: EOMI,  conjunctivae normal ENMT: Neck is midline, hearing appropriate Neck: normal, supple, no masses, no thyromegaly Respiratory: clear to auscultation bilaterally, no wheezing. Normal respiratory effort. No accessory muscle use.  Cardiovascular: Regular rate and rhythm, no murmurs. No extremity edema. Abdomen: no tenderness. Bowel sounds positive.  Musculoskeletal: No joint deformity upper extremities. Good ROM, no contractures, no atrophy. Skin: no rashes, ulcers on visible skin Neurologic: Strength is appropriate upper extremities.  Psychiatric: Normal judgment and insight. Alert and oriented x 3. Normal mood.   EKG: independently reviewed, showing sinus rhythm with rate of 86, QTc 455  Chest x-ray on Admission: I personally reviewed and I agree with radiologist reading as below.  DG Chest 2 View Result Date: 11/14/2024 EXAM: 2 VIEW(S) XRAY OF THE CHEST 11/14/2024 03:55:00 AM COMPARISON: 11/13/2024. CLINICAL HISTORY: CP FINDINGS: LUNGS AND PLEURA: No focal pulmonary opacity. No pleural effusion. No pneumothorax. HEART AND MEDIASTINUM: No acute abnormality of the cardiac and mediastinal silhouettes. BONES AND SOFT TISSUES: No acute osseous abnormality. IMPRESSION: 1. No acute process. Electronically signed by: Norman Gatlin MD 11/14/2024 04:01 AM EST RP Workstation: [id number]   DG Chest Portable 1 View Result Date: 11/13/2024 EXAM: 1 VIEW(S) XRAY OF THE CHEST 11/13/2024 08:22:00 AM COMPARISON: 05/21/2024 CLINICAL HISTORY: cp FINDINGS: LUNGS AND PLEURA: No focal pulmonary opacity. No pleural effusion. No pneumothorax. HEART AND MEDIASTINUM: No acute abnormality of the cardiac and mediastinal silhouettes. BONES AND SOFT TISSUES: No acute osseous abnormality. IMPRESSION: 1. No acute process. Electronically signed  by: Norleen Boxer MD 11/13/2024 09:15 AM EST RP Workstation: [id number]   Labs on Admission: I have personally reviewed following labs  CBC: Recent Labs  Lab 11/13/24 0806 11/14/24 0334  WBC 8.8 9.8  NEUTROABS 5.7  --   HGB 10.1* 10.6*  HCT 29.1* 30.8*  MCV 97.3 96.9  PLT 220 233   Basic Metabolic Panel: Recent Labs  Lab 11/13/24 0806 11/14/24 0334  NA 135 137  K 4.5 4.3  CL 106 105  CO2 21* 23  GLUCOSE 291* 209*  BUN 30* 37*  CREATININE 1.90* 2.08*  CALCIUM  7.9* 8.3*  GFR: Estimated Creatinine Clearance: 40.7 mL/min (A) (by C-G formula based on SCr of 2.08 mg/dL (H)).  Liver Function Tests: Recent Labs  Lab 11/13/24 0806  AST 29  ALT 26  ALKPHOS 47  BILITOT 0.2  PROT 5.3*  ALBUMIN 3.1*   Recent Labs  Lab 11/13/24 0806  LIPASE 52*   BNP (last 3 results) Recent Labs    11/13/24 1045  PROBNP 62.2   Urine analysis:    Component Value Date/Time   COLORURINE YELLOW 05/11/2024 1041   APPEARANCEUR HAZY (A) 05/11/2024 1041   LABSPEC 1.020 05/11/2024 1041   PHURINE 7.0 05/11/2024 1041   GLUCOSEU 100 (A) 05/11/2024 1041   HGBUR LARGE (A) 05/11/2024 1041   BILIRUBINUR NEGATIVE 05/11/2024 1041   KETONESUR NEGATIVE 05/11/2024 1041   PROTEINUR >=300 (A) 05/11/2024 1041   UROBILINOGEN 0.2 04/30/2011 0920   NITRITE NEGATIVE 05/11/2024 1041   LEUKOCYTESUR NEGATIVE 05/11/2024 1041   CRITICAL CARE Performed by: Dr. Sherre  Total critical care time: 32 minutes  Critical care time was exclusive of separately billable procedures and treating other patients.  Critical care was necessary to treat or prevent imminent or life-threatening deterioration.  Critical care was time spent personally by me on the following activities: development of treatment plan with patient as well as nursing, discussions with consultants, evaluation of patient's response to treatment, examination of patient, obtaining history from patient or surrogate, ordering and performing treatments  and interventions, ordering and review of laboratory studies, ordering and review of radiographic studies, pulse oximetry and re-evaluation of patient's condition.  This document was prepared using Dragon Voice Recognition software and may include unintentional dictation errors.  Dr. Sherre [facility] Location: [place]  If 7PM-7AM, please contact overnight-coverage provider If 7AM-7PM, please contact day attending provider [email or web address]  11/14/2024, 3:52 PM      [1]  Allergies Allergen Reactions   Wellbutrin [Bupropion] Shortness Of Breath    Difficulty breathing   Ozempic (0.25 Or 0.5 Mg-Dose) [Semaglutide(0.25 Or 0.5mg -Dos)] Nausea Only   Jardiance [Empagliflozin] Other (See Comments)    Yeast infections   Metformin And Related Other (See Comments)    Flu-like symptoms, muscle aches, body aches   Pantoprazole  Other (See Comments)    Made the patient feel terrible   Trulicity [Dulaglutide] Rash   "

## 2024-11-14 NOTE — Assessment & Plan Note (Signed)
 Home atorvastatin  80 mg nightly

## 2024-11-14 NOTE — Progress Notes (Signed)
 Hospitalist Transfer Note:    Nursing staff, Please call [facility] Admits & Consults System-Wide number on Amion [phone]) as soon as patient's arrival, so appropriate admitting provider can evaluate the pt.    Transferring facility: [facility] Requesting provider: Dr. Geroldine (EDP at [facility]) Reason for transfer: admission for further evaluation and management of chest pain.    43 year old female with known history of coronary artery disease, with reported left heart cath a few years ago showing 90% stenosis of first diagonal, without ensuing PCI/stent placement, who presented to [facility] ED complaining of 2 days of intermittent chest pain that has improved with sublingual nitroglycerin .  Over the last 2 days, she reports taking a total of 8 sublingual nitroglycerin  for her chest pain.  she had presented to the ED yesterday with similar complaints.  At that time, EDP discussed with on-call cardiology, who felt that it was okay for the patient to be discharged to home with outpatient follow-up, but with instruction to return to the ED if recurrence of chest pain.   Troponin yesterday showed following trend : 35 --> 20 --> 16.   First troponin today is 21, with repeat troponin pending.  Per my discussions with EDP, today's EKG shows no evidence of acute ischemic changes.  Chest x-ray shows no evidence of acute cardiopulmonary process.   Subsequently, I accepted this patient for transfer for observation to a med-tele bed at [facility] for further work-up and management of the above.       Eva Pore, DO Hospitalist

## 2024-11-14 NOTE — Assessment & Plan Note (Signed)
 Home Biktarvy  nightly resumed

## 2024-11-14 NOTE — Progress Notes (Signed)
 ANTICOAGULATION CONSULT NOTE  Pharmacy Consult for Heparin  Indication: chest pain/ACS  Allergies[1]  Patient Measurements: Height: 5' 7 (170.2 cm) Weight: 90.7 kg (199 lb 15.3 oz) IBW/kg (Calculated) : 61.6 Heparin  Dosing Weight: 81.1 kg  Vital Signs: Temp: 98.4 F (36.9 C) (12/20 0324) Temp Source: Oral (12/20 0324) BP: 130/83 (12/20 0700) Pulse Rate: 86 (12/20 0700)  Labs: Recent Labs    11/13/24 0806 11/14/24 0334  HGB 10.1* 10.6*  HCT 29.1* 30.8*  PLT 220 233  CREATININE 1.90* 2.08*    Estimated Creatinine Clearance: 40.7 mL/min (A) (by C-G formula based on SCr of 2.08 mg/dL (H)).   Medical History: Past Medical History:  Diagnosis Date   Abscess    Asthma    CHF (congestive heart failure) (HCC)    COPD (chronic obstructive pulmonary disease) (HCC)    Coronary artery disease    Diabetes mellitus without complication (HCC)    Emphysema of lung (HCC)    Heart attack (HCC)    HIV (human immunodeficiency virus infection) (HCC)    Hypertension    Pneumonia     Medications:  (Not in a hospital admission)  Scheduled:   atorvastatin   80 mg Oral QHS   bictegravir-emtricitabine -tenofovir  AF  1 tablet Oral QHS   carvedilol   3.125 mg Oral BID WC   clopidogrel   75 mg Oral Daily   famotidine   20 mg Oral BID AC   insulin  glargine-yfgn  40 Units Subcutaneous QHS   ranolazine   500 mg Oral BID   sacubitril -valsartan   1 tablet Oral BID   Infusions:   nitroGLYCERIN      PRN: acetaminophen  **OR** acetaminophen , albuterol , hydrALAZINE , melatonin, ondansetron  **OR** ondansetron  (ZOFRAN ) IV  Assessment: 40 yof with a history of CAD. Patient is presenting with chest pain. Heparin  per pharmacy consult placed for chest pain/ACS.  Patient is not on anticoagulation prior to arrival.  Hgb 10.6; plt 233  Goal of Therapy:  Heparin  level 0.3-0.7 units/ml Monitor platelets by anticoagulation protocol: Yes   Plan:  Give IV heparin  4000 units bolus x 1 Start heparin   infusion at 950 units/hr Check anti-Xa level in 8 hours and daily while on heparin  Continue to monitor H&H and platelets  Dorn Buttner, PharmD, BCPS 11/14/2024 9:34 AM ED Clinical Pharmacist -  [phone]       [1]  Allergies Allergen Reactions   Wellbutrin [Bupropion] Shortness Of Breath    Difficulty breathing   Ozempic (0.25 Or 0.5 Mg-Dose) [Semaglutide(0.25 Or 0.5mg -Dos)] Nausea Only   Jardiance [Empagliflozin] Other (See Comments)    Yeast infections   Metformin And Related Other (See Comments)    Flu-like symptoms, muscle aches, body aches   Pantoprazole  Other (See Comments)    Made the patient feel terrible   Trulicity [Dulaglutide] Rash

## 2024-11-14 NOTE — ED Notes (Signed)
 Pt provided cheerios and milk.

## 2024-11-14 NOTE — Assessment & Plan Note (Addendum)
 Patient has been compliant with home aspirin  and Plavix  Atorvastatin  80 mg nightly, Coreg  3.125 mg p.o. twice daily with meals, Ranexa  p.o. twice daily, Entresto  twice daily, Plavix  75 mg daily were resumed on admission

## 2024-11-14 NOTE — Assessment & Plan Note (Addendum)
 Differential diagnosis included unstable angina versus new onset heart failure given pain is worse when laying down Doubt dissection as patient appears comfortable laying in ED bed. Left heart catheterization in June 2024 is reviewed Nitroglycerin  gtt. initiated on admission to maintain SBP > 100 mmHg Heparin  GTT per pharmacy initiated Morphine  4 mg IV every 4 hours as needed for severe pain not relieved nitroglycerin  gtt., 2 days ordered Complete echo ordered Update (approximately 14:30): cardiology consultation has been paged, Dr. Sheena is aware.

## 2024-11-14 NOTE — Plan of Care (Signed)
" °  Problem: Education: Goal: Ability to describe self-care measures that may prevent or decrease complications (Diabetes Survival Skills Education) will improve Outcome: Progressing   Problem: Coping: Goal: Ability to adjust to condition or change in health will improve Outcome: Progressing   Problem: Fluid Volume: Goal: Ability to maintain a balanced intake and output will improve Outcome: Progressing   Problem: Health Behavior/Discharge Planning: Goal: Ability to identify and utilize available resources and services will improve Outcome: Progressing Goal: Ability to manage health-related needs will improve Outcome: Progressing   Problem: Metabolic: Goal: Ability to maintain appropriate glucose levels will improve Outcome: Progressing   Problem: Nutritional: Goal: Maintenance of adequate nutrition will improve Outcome: Progressing   Problem: Skin Integrity: Goal: Risk for impaired skin integrity will decrease Outcome: Progressing   Problem: Tissue Perfusion: Goal: Adequacy of tissue perfusion will improve Outcome: Progressing   Problem: Health Behavior/Discharge Planning: Goal: Ability to manage health-related needs will improve Outcome: Progressing   Problem: Clinical Measurements: Goal: Ability to maintain clinical measurements within normal limits will improve Outcome: Progressing   Problem: Activity: Goal: Risk for activity intolerance will decrease Outcome: Progressing   Problem: Nutrition: Goal: Adequate nutrition will be maintained Outcome: Progressing   Problem: Coping: Goal: Level of anxiety will decrease Outcome: Progressing   Problem: Pain Managment: Goal: General experience of comfort will improve and/or be controlled Outcome: Progressing   "

## 2024-11-14 NOTE — Hospital Course (Addendum)
 Ms. Renee Bryant is a 43 year old female with history of HIV compliant with Biktarvy , insulin -dependent diabetes mellitus, obesity, hyperlipidemia, hypertension, history of angina on Ranexa , CAD on dual antiplatelet therapy, COPD, CKD stage IIIb, history of uterine bleeding.  11/14/2024: she presents to the ED for chief concerns of chest pain for 2 to 3 days.  Vitals at the time of my evaluation showed t of 98.4, rr of 17, hr 94, blood pressure 118/80, SpO2 100% on room air.  Serum sodium is 137, potassium 4.3, chloride 105, bicarb 23, BUN of 37, serum creatinine 2.08, eGFR of 30, nonfasting glucose 209, WBC 9.8, hemoglobin 10.6, platelets 233.  High sensitive troponin was 21 and on repeat was 24.  Of note, yesterday patient presented for the same concerns and troponin at that time was 20 and a repeat was 16.  Serum pregnancy was negative.  ED treatment: Aspirin  324 mg p.o. one-time dose, morphine  4 mg IV one-time dose, ondansetron  4 mg IV one-time dose.  12/20: Patient admitted to hospitalist service for chief concerns of chest pain, NSTEMI.

## 2024-11-14 NOTE — Assessment & Plan Note (Addendum)
 Home Entresto  24-26 mg twice daily, Coreg  3.125 mg with meals were resumed Hydralazine  5 mg IV every 6 hours as needed for SBP greater 170, 5 days ordered

## 2024-11-14 NOTE — Assessment & Plan Note (Signed)
 Home famotidine  20 mg p.o. twice daily before meals

## 2024-11-14 NOTE — ED Provider Notes (Signed)
 " [place] [facility] AT [facility] Provider Note   CSN: [id number] Arrival date & time: 11/14/24  9684     Patient presents with: Chest Pain   Renee Bryant is a 43 y.o. female.   Patient is a 43 year old female with past medical history of coronary artery disease.  Patient presents today with complaints of chest pain.  She was seen here yesterday with similar complaints.  She had 2 negative troponins and in consultation with cardiology the decision was made to discharge her with outpatient follow-up.  Patient returns stating that she is having ongoing pains in the center of her chest.  She reports having taken 8 nitroglycerin  tablets in the last 24 hours.  This helps briefly, then the discomfort returns.  It does seem to be worse when she lies flat.  She also reports feeling short of breath, but denies any nausea, diaphoresis, or radiation to the arm or jaw.  She reports a prior MI with elevated troponin 2 or 3 years ago.  She was found by cath to have a small branch vessel 90% occlusion and another 70% lesion that were not amenable to stenting.       Prior to Admission medications  Medication Sig Start Date End Date Taking? Authorizing Provider  albuterol  (VENTOLIN  HFA) 108 (90 Base) MCG/ACT inhaler Inhale 1-2 puffs into the lungs every 6 (six) hours as needed for wheezing or shortness of breath. 07/22/23   Amon Aloysius BRAVO, MD  aspirin  81 MG chewable tablet Chew 1 tablet (81 mg total) by mouth daily. 05/03/23   Danford, Lonni SQUIBB, MD  atorvastatin  (LIPITOR ) 80 MG tablet TAKE 1 TABLET (80 MG TOTAL) BY MOUTH DAILY. 07/24/24   Almarie Waddell NOVAK, NP  bictegravir-emtricitabine -tenofovir  AF (BIKTARVY ) 50-200-25 MG TABS tablet Take 1 tablet by mouth at bedtime.    [provider]  carvedilol  (COREG ) 3.125 MG tablet TAKE 1 TABLET (3.125 MG TOTAL) BY MOUTH 2 (TWO) TIMES DAILY WITH A MEAL. 08/31/24   Loistine Sober, NP  Cholecalciferol (VITAMIN D3) 50 MCG (2000  UT) TABS Take 2,000 Units by mouth daily.    [provider]  clonazePAM  (KLONOPIN ) 1 MG tablet Take 1 mg by mouth in the morning and at bedtime.    [provider]  clopidogrel  (PLAVIX ) 75 MG tablet TAKE 1 TABLET (75 MG TOTAL) BY MOUTH DAILY. 08/31/24   O'NealDarryle Ned, MD  Continuous Glucose Sensor (FREESTYLE LIBRE 3 PLUS SENSOR) MISC Inject 1 Device into the skin See admin instructions. Place 1 new sensor into the skin every 15 days    [provider]  Continuous Glucose Sensor (FREESTYLE LIBRE 3 SENSOR) MISC Use to check blood glucose continuously. 10/22/23   Almarie Waddell NOVAK, NP  ENTRESTO  24-26 MG TAKE ONE TABLET BY MOUTH TWICE DAILY 09/23/24   O'Neal, Darryle Ned, MD  [Paused] Evolocumab  (REPATHA  SURECLICK) 140 MG/ML SOAJ Inject 140 mg into the skin every 14 (fourteen) days. Wait to take this until your doctor or other care provider tells you to start again. 10/16/23   O'NealDarryle Ned, MD  EXCEDRIN  EXTRA STRENGTH 250-250-65 MG tablet Take 1-2 tablets by mouth every 6 (six) hours as needed for headache or migraine.    [provider]  famotidine  (PEPCID ) 20 MG tablet Take 20 mg by mouth 2 (two) times daily before a meal.    [provider]  furosemide  (LASIX ) 20 MG tablet Take 1 tablet (20 mg total) by mouth daily as needed for edema  or fluid. 11/19/23   Almarie Waddell NOVAK, NP  Insulin  Pen Needle 32G X 4 MM MISC Use 3 times a day with Humalog  Kwik pen 07/22/23   Amon Aloysius BRAVO, MD  Insulin  Syringe-Needle U-100 (GNP INSULIN  SYRINGES 31GX5/16) 31G X 5/16 0.3 ML MISC Use to inject Lantus  once a day 07/22/23   Paz, Jose E, MD  lansoprazole  (PREVACID  SOLUTAB) 30 MG disintegrating tablet Take 1 tablet (30 mg total) by mouth daily at 12 noon. Patient taking differently: Take 30 mg by mouth See admin instructions. Dissolve 30 mg in the mouth in the afternoon 03/31/24   Almarie Waddell NOVAK, NP  LANTUS  SOLOSTAR 100 UNIT/ML Solostar Pen Inject 40 Units into the skin  at bedtime.    [provider]  MOUNJARO  5 MG/0.5ML Pen Inject 5 mg into the skin every Tuesday.    [provider]  nitroGLYCERIN  (NITROSTAT ) 0.4 MG SL tablet Place 1 tablet (0.4 mg total) under the tongue every 5 (five) minutes as needed for chest pain. 08/26/23 10/05/24  Loistine Sober, NP  ranolazine  (RANEXA ) 500 MG 12 hr tablet TAKE 1 TABLET (500 MG TOTAL) BY MOUTH 2 (TWO) TIMES DAILY. 09/11/24   O'NealDarryle Ned, MD  valACYclovir (VALTREX) 1000 MG tablet Take 1,000 mg by mouth daily as needed (for flares- AS DIRECTED).    [provider]  Vitamin D , Ergocalciferol , (DRISDOL ) 1.25 MG (50000 UNIT) CAPS capsule TAKE 1 CAPSULE ONCE WEEKLY Patient taking differently: Take 50,000 Units by mouth See admin instructions. Take 50,000 units by mouth on Tuesdays and Thursdays 02/11/24   Almarie Waddell NOVAK, NP    Allergies: Ozempic (0.25 or 0.5 mg-dose) [semaglutide(0.25 or 0.5mg -dos)], Jardiance [empagliflozin], Metformin and related, Pantoprazole , Wellbutrin [bupropion], and Trulicity [dulaglutide]    Review of Systems  All other systems reviewed and are negative.   Updated Vital Signs BP (!) 141/97   Pulse 81   Temp 98.4 F (36.9 C) (Oral)   Resp (!) 22   Ht 5' 7 (1.702 m)   Wt 90.7 kg   LMP 11/01/2024 (Approximate)   SpO2 99%   BMI 31.32 kg/m   Physical Exam Vitals and nursing note reviewed.  Constitutional:      General: She is not in acute distress.    Appearance: She is well-developed. She is not diaphoretic.  HENT:     Head: Normocephalic and atraumatic.  Cardiovascular:     Rate and Rhythm: Normal rate and regular rhythm.     Heart sounds: No murmur heard.    No friction rub. No gallop.  Pulmonary:     Effort: Pulmonary effort is normal. No respiratory distress.     Breath sounds: Normal breath sounds. No wheezing.  Abdominal:     General: Bowel sounds are normal. There is no distension.     Palpations: Abdomen is soft.     Tenderness:  There is no abdominal tenderness.  Musculoskeletal:        General: Normal range of motion.     Cervical back: Normal range of motion and neck supple.     Right lower leg: No tenderness. No edema.     Left lower leg: No tenderness. No edema.  Skin:    General: Skin is warm and dry.  Neurological:     General: No focal deficit present.     Mental Status: She is alert and oriented to person, place, and time.     (all labs ordered are listed, but only abnormal results are displayed) Labs  Reviewed  BASIC METABOLIC PANEL WITH GFR  CBC  HCG, SERUM, QUALITATIVE  TROPONIN T, HIGH SENSITIVITY    EKG: EKG Interpretation Date/Time:  Saturday November 14 2024 03:33:41 EST Ventricular Rate:  86 PR Interval:  177 QRS Duration:  86 QT Interval:  380 QTC Calculation: 455 R Axis:   51  Text Interpretation: Sinus rhythm Low voltage, precordial leads No significant change since 11/13/2024 Confirmed by Geroldine Berg ([id number]) on 11/14/2024 3:38:02 AM  Radiology: DG Chest Portable 1 View Result Date: 11/13/2024 EXAM: 1 VIEW(S) XRAY OF THE CHEST 11/13/2024 08:22:00 AM COMPARISON: 05/21/2024 CLINICAL HISTORY: cp FINDINGS: LUNGS AND PLEURA: No focal pulmonary opacity. No pleural effusion. No pneumothorax. HEART AND MEDIASTINUM: No acute abnormality of the cardiac and mediastinal silhouettes. BONES AND SOFT TISSUES: No acute osseous abnormality. IMPRESSION: 1. No acute process. Electronically signed by: Norleen Boxer MD 11/13/2024 09:15 AM EST RP Workstation: [id number]     Procedures   Medications Ordered in the ED  aspirin  chewable tablet 324 mg (has no administration in time range)                                    Medical Decision Making Amount and/or Complexity of Data Reviewed Labs: ordered. Radiology: ordered.  Risk OTC drugs. Prescription drug management.   Patient presenting here with complaints of chest discomfort as described in the HPI.  She arrives here with stable vital  signs and is afebrile.  Physical examination is unremarkable.  Patient seen yesterday with the same complaint.  Laboratory studies obtained including CBC, metabolic panel, and troponin.  Creatinine is 2.08 which is consistent with baseline.  Troponin is 21, also consistent with baseline.  Chest x-ray showing no acute process.  Patient has received morphine  and aspirin  and will be admitted to the hospitalist service for observation and consideration of cardiac intervention if clinically indicated.     Final diagnoses:  None    ED Discharge Orders     None          Geroldine Berg, MD 11/14/24 [phone]  "

## 2024-11-14 NOTE — Consult Note (Signed)
 "  Cardiology Consultation   Patient ID: Renee Bryant MRN: [id number]; DOB: 1981/06/13  Admit date: 11/14/2024 Date of Consult: 11/14/2024  PCP:  Almarie Waddell NOVAK, NP   [facility] Providers Cardiologist:  Darryle ONEIDA Decent, MD        Patient Profile: Renee Bryant is a 43 y.o. female with a hx of coronary artery disease status post PCI to the ostial agonal vessel with recent heart catheterization in June 2024 showing mild mid LAD obstructive coronary artery disease at which time she was placed on dual antiplatelet therapy, diabetes type 2, HIV, heart failure with improved ejection fraction, hypertension, COPD who is being seen 11/14/2024 for the evaluation of discomfort at the request of Dr. Sherre.  History of Present Illness: [name] tells me that over the last several days she has been experiencing intermittent chest discomfort.  She notes that presented to the emergency department December 19 at which time she was experiencing some chest pain she had troponin which was not impressive she was discharged to home.  And then that evening she started to experience similar pain.  Today she reports that she started experiencing pain where she described it as a midsternal left-sided pressure sensation which work intermittent comes and goes.  In the ED she was noted to have troponin again at 20.  But the pain has persisted  Given that she was admitted to the hospitalist service started on heparin  and nitro drip and cardiology has been consulted to see the patient.   Past Medical History:  Diagnosis Date   Abscess    Asthma    CHF (congestive heart failure) (HCC)    COPD (chronic obstructive pulmonary disease) (HCC)    Coronary artery disease    Diabetes mellitus without complication (HCC)    Emphysema of lung (HCC)    Heart attack (HCC)    HIV (human immunodeficiency virus infection) (HCC)    Hypertension    Pneumonia     Past Surgical History:  Procedure Laterality Date    CESAREAN SECTION     CHOLECYSTECTOMY     FOOT SURGERY Left    LEFT HEART CATH AND CORONARY ANGIOGRAPHY N/A 05/03/2023   Procedure: LEFT HEART CATH AND CORONARY ANGIOGRAPHY;  Surgeon: Jordan, Peter M, MD;  Location: [facility] INVASIVE CV [facility];  Service: Cardiovascular;  Laterality: N/A;   TUBAL LIGATION         Scheduled Meds:  atorvastatin   80 mg Oral QHS   bictegravir-emtricitabine -tenofovir  AF  1 tablet Oral QHS   carvedilol   3.125 mg Oral BID WC   clopidogrel   75 mg Oral Daily   famotidine   20 mg Oral BID AC   insulin  aspart  0-5 Units Subcutaneous QHS   insulin  aspart  0-9 Units Subcutaneous TID WC   insulin  glargine  40 Units Subcutaneous QHS   ranolazine   500 mg Oral BID   sacubitril -valsartan   1 tablet Oral BID   Continuous Infusions:  heparin  950 Units/hr (11/14/24 0952)   nitroGLYCERIN  18 mcg/min (11/14/24 1456)   PRN Meds: acetaminophen  **OR** acetaminophen , albuterol , hydrALAZINE , melatonin, morphine  injection, ondansetron  **OR** ondansetron  (ZOFRAN ) IV  Allergies:   Allergies[1]  Social History:   Social History   Socioeconomic History   Marital status: Married    Spouse name: Not on file   Number of children: Not on file   Years of education: Not on file   Highest education level: Not on file  Occupational History   Not on file  Tobacco Use  Smoking status: Former    Current packs/day: 1.00    Average packs/day: 1 pack/day for 31.2 years (31.2 ttl pk-yrs)    Types: Cigarettes    Start date: 09/09/1993   Smokeless tobacco: Never   Tobacco comments:    Pt. Reports down to 1/2 a pack a day  Vaping Use   Vaping status: Never Used  Substance and Sexual Activity   Alcohol use: No   Drug use: No   Sexual activity: Yes    Partners: Male    Birth control/protection: Surgical  Other Topics Concern   Not on file  Social History Narrative   Not on file   Social Drivers of Health   Tobacco Use: Medium Risk (11/14/2024)   Patient History    Smoking Tobacco  Use: Former    Smokeless Tobacco Use: Never    Passive Exposure: Not on Actuary Strain: Not on file  Food Insecurity: No Food Insecurity (11/14/2024)   Epic    Worried About Programme Researcher, Broadcasting/film/video in the Last Year: Never true    Ran Out of Food in the Last Year: Never true  Transportation Needs: No Transportation Needs (11/14/2024)   Epic    Lack of Transportation (Medical): No    Lack of Transportation (Non-Medical): No  Physical Activity: Not on file  Stress: Not on file  Social Connections: Unknown (04/03/2022)   [facility]   Social Network    Social Network: Not on file  Intimate Partner Violence: Not At Risk (11/14/2024)   Epic    Fear of Current or Ex-Partner: No    Emotionally Abused: No    Physically Abused: No    Sexually Abused: No  Depression (PHQ2-9): Medium Risk (10/05/2024)   Depression (PHQ2-9)    PHQ-2 Score: 5  Alcohol Screen: Not on file  Housing: Low Risk (11/14/2024)   Epic    Unable to Pay for Housing in the Last Year: No    Number of Times Moved in the Last Year: 0    Homeless in the Last Year: No  Utilities: Not At Risk (11/14/2024)   Epic    Threatened with loss of utilities: No  Health Literacy: Not on file    Family History:    Family History  Problem Relation Age of Onset   Hypertension Mother    Hypertension Father    Hypertension Maternal Grandfather      ROS:  Please see the history of present illness.  Chest pain  All other ROS reviewed and negative.     Physical Exam/Data: Vitals:   11/14/24 1130 11/14/24 1145 11/14/24 1215 11/14/24 1354  BP: 110/74 104/74 112/82   Pulse: 81 77 84 80  Resp: 15 15 18 20   Temp:    98.2 F (36.8 C)  TempSrc:    Oral  SpO2: 94% 95% 93% 100%  Weight:      Height:       No intake or output data in the 24 hours ending 11/14/24 1530    11/14/2024    3:23 AM 11/13/2024    8:08 AM 10/05/2024    2:45 PM  Last 3 Weights  Weight (lbs) 199 lb 15.3 oz 200 lb 210 lb  3.2 oz  Weight (kg) 90.7 kg 90.719 kg 95.346 kg     Body mass index is 31.32 kg/m. General:  Well nourished, well developed, in no acute distress HEENT: normal Neck: no JVD Vascular: No carotid bruits; Distal pulses 2+  bilaterally Cardiac:  normal S1, S2; RRR; no murmur  Lungs:  clear to auscultation bilaterally, no wheezing, rhonchi or rales  Abd: soft, nontender, no hepatomegaly  Ext: no edema Musculoskeletal:  No deformities, BUE and BLE strength normal and equal Skin: warm and dry  Neuro:  CNs 2-12 intact, no focal abnormalities noted Psych:  Normal affect   EKG:  The EKG was personally reviewed and demonstrates:   Telemetry:  Telemetry was personally reviewed and demonstrates:    Relevant CV Studies: echo  Laboratory Data: High Sensitivity Troponin:  No results for input(s): TROPONINIHS in the last 720 hours.  Recent Labs  Lab 11/13/24 0806 11/13/24 1045 11/14/24 0334 11/14/24 0532  TRNPT 20* 16 21* 24*      Chemistry Recent Labs  Lab 11/13/24 0806 11/14/24 0334  NA 135 137  K 4.5 4.3  CL 106 105  CO2 21* 23  GLUCOSE 291* 209*  BUN 30* 37*  CREATININE 1.90* 2.08*  CALCIUM  7.9* 8.3*  GFRNONAA 33* 30*  ANIONGAP 9 9    Recent Labs  Lab 11/13/24 0806  PROT 5.3*  ALBUMIN 3.1*  AST 29  ALT 26  ALKPHOS 47  BILITOT 0.2   Lipids No results for input(s): CHOL, TRIG, HDL, LABVLDL, LDLCALC, CHOLHDL in the last 168 hours.  Hematology Recent Labs  Lab 11/13/24 0806 11/14/24 0334  WBC 8.8 9.8  RBC 2.99* 3.18*  HGB 10.1* 10.6*  HCT 29.1* 30.8*  MCV 97.3 96.9  MCH 33.8 33.3  MCHC 34.7 34.4  RDW 12.2 12.5  PLT 220 233   Thyroid  No results for input(s): TSH, FREET4 in the last 168 hours.  BNP Recent Labs  Lab 11/13/24 1045  PROBNP 62.2    DDimer No results for input(s): DDIMER in the last 168 hours.  Radiology/Studies:  DG Chest 2 View Result Date: 11/14/2024 EXAM: 2 VIEW(S) XRAY OF THE CHEST 11/14/2024 03:55:00 AM  COMPARISON: 11/13/2024. CLINICAL HISTORY: CP FINDINGS: LUNGS AND PLEURA: No focal pulmonary opacity. No pleural effusion. No pneumothorax. HEART AND MEDIASTINUM: No acute abnormality of the cardiac and mediastinal silhouettes. BONES AND SOFT TISSUES: No acute osseous abnormality. IMPRESSION: 1. No acute process. Electronically signed by: Norman Gatlin MD 11/14/2024 04:01 AM EST RP Workstation: [id number]   DG Chest Portable 1 View Result Date: 11/13/2024 EXAM: 1 VIEW(S) XRAY OF THE CHEST 11/13/2024 08:22:00 AM COMPARISON: 05/21/2024 CLINICAL HISTORY: cp FINDINGS: LUNGS AND PLEURA: No focal pulmonary opacity. No pleural effusion. No pneumothorax. HEART AND MEDIASTINUM: No acute abnormality of the cardiac and mediastinal silhouettes. BONES AND SOFT TISSUES: No acute osseous abnormality. IMPRESSION: 1. No acute process. Electronically signed by: Norleen Boxer MD 11/13/2024 09:15 AM EST RP Workstation: [id number]     Assessment and Plan: Elevated troponin suspecting unstable angina based on symptoms Coronary artery disease status post PCI Type 2 diabetes Hyperlipidemia Hypertension  She is still experiencing chest discomfort.  She is on a heparin  drip as well as a nitro drip we will keep this going.  I will be beneficial to cath the patient early next week.  For now we will get a repeat echocardiogram to reassess for any wall motion abnormalities.  Continue aspirin  81 mg daily with her Plavix  75 mg daily. She is on atorvastatin  80 mg we will continue this.  Blood pressure is marginal we will hold off on her GDMT and initiate that as appropriate.  Clinically she appears to be euvolemic she is not in heart failure. Will increase the Ranexa  to 1000 mg twice  daily.    Risk Assessment/Risk Scores:    TIMI Risk Score for Unstable Angina or Non-ST Elevation MI:   The patient's TIMI risk score is  , which indicates a  % risk of all cause mortality, new or recurrent myocardial infarction or need for  urgent revascularization in the next 14 days.       For questions or updates, please contact  [facility] Please consult [email or web address] for contact info under      Signed, Jessic Standifer, DO  11/14/2024 3:30 PM      [1]  Allergies Allergen Reactions   Wellbutrin [Bupropion] Shortness Of Breath    Difficulty breathing   Ozempic (0.25 Or 0.5 Mg-Dose) [Semaglutide(0.25 Or 0.5mg -Dos)] Nausea Only   Jardiance [Empagliflozin] Other (See Comments)    Yeast infections   Metformin And Related Other (See Comments)    Flu-like symptoms, muscle aches, body aches   Pantoprazole  Other (See Comments)    Made the patient feel terrible   Trulicity [Dulaglutide] Rash   "

## 2024-11-14 NOTE — ED Triage Notes (Signed)
 Pt C/O chest pain, X 2-3 days ago, called EMS, and was seen here for same yesterday. States has started hurting while laying down.

## 2024-11-14 NOTE — Assessment & Plan Note (Signed)
 Patient experiences angina at baseline Ranexa  500 p.o. twice daily resumed

## 2024-11-15 ENCOUNTER — Observation Stay (HOSPITAL_COMMUNITY)

## 2024-11-15 DIAGNOSIS — Z7902 Long term (current) use of antithrombotics/antiplatelets: Secondary | ICD-10-CM | POA: Diagnosis not present

## 2024-11-15 DIAGNOSIS — I200 Unstable angina: Secondary | ICD-10-CM | POA: Diagnosis not present

## 2024-11-15 DIAGNOSIS — N1832 Chronic kidney disease, stage 3b: Secondary | ICD-10-CM | POA: Diagnosis present

## 2024-11-15 DIAGNOSIS — Z8679 Personal history of other diseases of the circulatory system: Secondary | ICD-10-CM

## 2024-11-15 DIAGNOSIS — I252 Old myocardial infarction: Secondary | ICD-10-CM | POA: Diagnosis not present

## 2024-11-15 DIAGNOSIS — Z79899 Other long term (current) drug therapy: Secondary | ICD-10-CM | POA: Diagnosis not present

## 2024-11-15 DIAGNOSIS — E782 Mixed hyperlipidemia: Secondary | ICD-10-CM | POA: Diagnosis present

## 2024-11-15 DIAGNOSIS — Z21 Asymptomatic human immunodeficiency virus [HIV] infection status: Secondary | ICD-10-CM | POA: Diagnosis present

## 2024-11-15 DIAGNOSIS — R079 Chest pain, unspecified: Secondary | ICD-10-CM

## 2024-11-15 DIAGNOSIS — I25110 Atherosclerotic heart disease of native coronary artery with unstable angina pectoris: Secondary | ICD-10-CM | POA: Diagnosis present

## 2024-11-15 DIAGNOSIS — Z87891 Personal history of nicotine dependence: Secondary | ICD-10-CM | POA: Diagnosis not present

## 2024-11-15 DIAGNOSIS — Z7982 Long term (current) use of aspirin: Secondary | ICD-10-CM | POA: Diagnosis not present

## 2024-11-15 DIAGNOSIS — Z794 Long term (current) use of insulin: Secondary | ICD-10-CM | POA: Diagnosis not present

## 2024-11-15 DIAGNOSIS — E119 Type 2 diabetes mellitus without complications: Secondary | ICD-10-CM | POA: Diagnosis not present

## 2024-11-15 DIAGNOSIS — E669 Obesity, unspecified: Secondary | ICD-10-CM | POA: Diagnosis present

## 2024-11-15 DIAGNOSIS — J4489 Other specified chronic obstructive pulmonary disease: Secondary | ICD-10-CM | POA: Diagnosis present

## 2024-11-15 DIAGNOSIS — F32A Depression, unspecified: Secondary | ICD-10-CM | POA: Diagnosis present

## 2024-11-15 DIAGNOSIS — N179 Acute kidney failure, unspecified: Secondary | ICD-10-CM | POA: Diagnosis present

## 2024-11-15 DIAGNOSIS — I2510 Atherosclerotic heart disease of native coronary artery without angina pectoris: Secondary | ICD-10-CM | POA: Diagnosis present

## 2024-11-15 DIAGNOSIS — Z8249 Family history of ischemic heart disease and other diseases of the circulatory system: Secondary | ICD-10-CM | POA: Diagnosis not present

## 2024-11-15 DIAGNOSIS — I10 Essential (primary) hypertension: Secondary | ICD-10-CM | POA: Diagnosis not present

## 2024-11-15 DIAGNOSIS — E1122 Type 2 diabetes mellitus with diabetic chronic kidney disease: Secondary | ICD-10-CM | POA: Diagnosis present

## 2024-11-15 DIAGNOSIS — I130 Hypertensive heart and chronic kidney disease with heart failure and stage 1 through stage 4 chronic kidney disease, or unspecified chronic kidney disease: Secondary | ICD-10-CM | POA: Diagnosis present

## 2024-11-15 DIAGNOSIS — Z6831 Body mass index (BMI) 31.0-31.9, adult: Secondary | ICD-10-CM | POA: Diagnosis not present

## 2024-11-15 DIAGNOSIS — K219 Gastro-esophageal reflux disease without esophagitis: Secondary | ICD-10-CM | POA: Diagnosis present

## 2024-11-15 DIAGNOSIS — I5023 Acute on chronic systolic (congestive) heart failure: Secondary | ICD-10-CM | POA: Diagnosis present

## 2024-11-15 DIAGNOSIS — Z955 Presence of coronary angioplasty implant and graft: Secondary | ICD-10-CM | POA: Diagnosis not present

## 2024-11-15 DIAGNOSIS — I214 Non-ST elevation (NSTEMI) myocardial infarction: Secondary | ICD-10-CM | POA: Diagnosis not present

## 2024-11-15 DIAGNOSIS — J439 Emphysema, unspecified: Secondary | ICD-10-CM | POA: Diagnosis present

## 2024-11-15 DIAGNOSIS — Z9049 Acquired absence of other specified parts of digestive tract: Secondary | ICD-10-CM | POA: Diagnosis not present

## 2024-11-15 LAB — CBC
HCT: 28.9 % — ABNORMAL LOW (ref 36.0–46.0)
Hemoglobin: 10.1 g/dL — ABNORMAL LOW (ref 12.0–15.0)
MCH: 34.5 pg — ABNORMAL HIGH (ref 26.0–34.0)
MCHC: 34.9 g/dL (ref 30.0–36.0)
MCV: 98.6 fL (ref 80.0–100.0)
Platelets: 181 K/uL (ref 150–400)
RBC: 2.93 MIL/uL — ABNORMAL LOW (ref 3.87–5.11)
RDW: 12.3 % (ref 11.5–15.5)
WBC: 8.1 K/uL (ref 4.0–10.5)
nRBC: 0 % (ref 0.0–0.2)

## 2024-11-15 LAB — ECHOCARDIOGRAM COMPLETE
Area-P 1/2: 2.77 cm2
Calc EF: 43.4 %
Height: 67 in
S' Lateral: 2.8 cm
Single Plane A2C EF: 45 %
Single Plane A4C EF: 43.6 %
Weight: 3199.32 [oz_av]

## 2024-11-15 LAB — HEPARIN LEVEL (UNFRACTIONATED)
Heparin Unfractionated: 0.17 [IU]/mL — ABNORMAL LOW (ref 0.30–0.70)
Heparin Unfractionated: 0.49 [IU]/mL (ref 0.30–0.70)
Heparin Unfractionated: 0.46 [IU]/mL (ref 0.30–0.70)

## 2024-11-15 LAB — BASIC METABOLIC PANEL WITH GFR
Anion gap: 7 (ref 5–15)
BUN: 29 mg/dL — ABNORMAL HIGH (ref 6–20)
CO2: 21 mmol/L — ABNORMAL LOW (ref 22–32)
Calcium: 7.9 mg/dL — ABNORMAL LOW (ref 8.9–10.3)
Chloride: 104 mmol/L (ref 98–111)
Creatinine, Ser: 1.83 mg/dL — ABNORMAL HIGH (ref 0.44–1.00)
GFR, Estimated: 35 mL/min — ABNORMAL LOW
Glucose, Bld: 165 mg/dL — ABNORMAL HIGH (ref 70–99)
Potassium: 4.5 mmol/L (ref 3.5–5.1)
Sodium: 132 mmol/L — ABNORMAL LOW (ref 135–145)

## 2024-11-15 LAB — GLUCOSE, CAPILLARY
Glucose-Capillary: 151 mg/dL — ABNORMAL HIGH (ref 70–99)
Glucose-Capillary: 182 mg/dL — ABNORMAL HIGH (ref 70–99)
Glucose-Capillary: 191 mg/dL — ABNORMAL HIGH (ref 70–99)

## 2024-11-15 MED ORDER — TIRZEPATIDE 5 MG/0.5ML ~~LOC~~ SOAJ: 5.0000 mg | SUBCUTANEOUS | Status: DC

## 2024-11-15 MED ORDER — HEPARIN BOLUS VIA INFUSION
2000.0000 [IU] | Freq: Once | INTRAVENOUS | Status: AC
  Administered 2024-11-15: 2000 [IU] via INTRAVENOUS
  Filled 2024-11-15: qty 2000

## 2024-11-15 MED ORDER — ASPIRIN 81 MG PO CHEW
81.0000 mg | CHEWABLE_TABLET | Freq: Every day | ORAL | Status: AC
  Administered 2024-11-15 – 2024-11-18 (×2): 81 mg via ORAL
  Filled 2024-11-15 (×3): qty 1

## 2024-11-15 MED ORDER — DIPHENHYDRAMINE HCL 25 MG PO CAPS
25.0000 mg | ORAL_CAPSULE | Freq: Four times a day (QID) | ORAL | Status: DC | PRN
  Administered 2024-11-15: 25 mg via ORAL
  Filled 2024-11-15: qty 1

## 2024-11-15 MED ORDER — ASPIRIN 81 MG PO CHEW
81.0000 mg | CHEWABLE_TABLET | ORAL | Status: AC
  Administered 2024-11-16: 81 mg via ORAL
  Filled 2024-11-15: qty 1

## 2024-11-15 MED ORDER — CLONAZEPAM 0.5 MG PO TABS
1.0000 mg | ORAL_TABLET | Freq: Two times a day (BID) | ORAL | Status: AC
  Administered 2024-11-15 – 2024-11-18 (×7): 1 mg via ORAL
  Filled 2024-11-15 (×7): qty 2

## 2024-11-15 MED ORDER — SODIUM CHLORIDE 0.9 % IV SOLN: INTRAVENOUS | Status: DC

## 2024-11-15 MED ORDER — PERFLUTREN LIPID MICROSPHERE
1.0000 mL | INTRAVENOUS | Status: DC | PRN
  Administered 2024-11-15: 3 mL via INTRAVENOUS

## 2024-11-15 NOTE — Progress Notes (Signed)
 Echocardiogram 2D Echocardiogram has been performed.  Renee Bryant Jayzen Paver RDCS 11/15/2024, 12:09 PM

## 2024-11-15 NOTE — Progress Notes (Signed)
 ANTICOAGULATION CONSULT NOTE  Pharmacy Consult for Heparin  Indication: chest pain/ACS  Allergies[1]  Patient Measurements: Height: 5' 7 (170.2 cm) Weight: 90.7 kg (199 lb 15.3 oz) IBW/kg (Calculated) : 61.6 Heparin  Dosing Weight: 81.1 kg  Vital Signs: Temp: 98.7 F (37.1 C) (12/21 1648) Temp Source: Oral (12/21 1648) BP: 121/76 (12/21 1933) Pulse Rate: 77 (12/21 1648)  Labs: Recent Labs    11/13/24 0806 11/14/24 0334 11/14/24 1849 11/15/24 0534 11/15/24 1327 11/15/24 2003  HGB 10.1* 10.6*  --  10.1*  --   --   HCT 29.1* 30.8*  --  28.9*  --   --   PLT 220 233  --  181  --   --   HEPARINUNFRC  --   --    < > 0.17* 0.49 0.46  CREATININE 1.90* 2.08*  --  1.83*  --   --    < > = values in this interval not displayed.    Estimated Creatinine Clearance: 46.3 mL/min (A) (by C-G formula based on SCr of 1.83 mg/dL (H)).  Medical History: Past Medical History:  Diagnosis Date   Abscess    Asthma    CHF (congestive heart failure) (HCC)    COPD (chronic obstructive pulmonary disease) (HCC)    Coronary artery disease    Diabetes mellitus without complication (HCC)    Emphysema of lung (HCC)    Heart attack (HCC)    HIV (human immunodeficiency virus infection) (HCC)    Hypertension    Pneumonia    Assessment: 7 yof with a history of CAD. Patient is presenting with chest pain. Heparin  per pharmacy consult placed for chest pain/ACS. Patient is not on anticoagulation prior to arrival. Hgb 10.6; plt 233  Heparin  level therapeutic at 0.49. Per RN no s/s bleeding and no interruptions to infusion. CBC stable. Continue.   PM: confirmatory heparin  level 0.46 is therapeutic on 1400 units/hr. No issues with the infusion or bleeding reported.  Goal of Therapy:  Heparin  level 0.3-0.7 units/ml Monitor platelets by anticoagulation protocol: Yes   Plan:  Continue heparin  infusion to 1400 units/hr Check anti-Xa level daily while on heparin  Continue to monitor H&H and  platelets  Rocky Slade, PharmD, BCPS 11/15/2024 8:40 PM  Please check AMION for all [facility] Pharmacy phone numbers After 10:00 PM, call Main Pharmacy [phone]            [1]  Allergies Allergen Reactions   Wellbutrin [Bupropion] Shortness Of Breath    Difficulty breathing   Ozempic (0.25 Or 0.5 Mg-Dose) [Semaglutide(0.25 Or 0.5mg -Dos)] Nausea Only   Jardiance [Empagliflozin] Other (See Comments)    Yeast infections   Metformin And Related Other (See Comments)    Flu-like symptoms, muscle aches, body aches   Pantoprazole  Other (See Comments)    Made the patient feel terrible   Trulicity [Dulaglutide] Rash

## 2024-11-15 NOTE — Progress Notes (Signed)
 ANTICOAGULATION CONSULT NOTE  Pharmacy Consult for Heparin  Indication: chest pain/ACS  Allergies[1]  Patient Measurements: Height: 5' 7 (170.2 cm) Weight: 90.7 kg (199 lb 15.3 oz) IBW/kg (Calculated) : 61.6 Heparin  Dosing Weight: 81.1 kg  Vital Signs: Temp: 99.6 F (37.6 C) (12/20 2336) Temp Source: Oral (12/20 2336) BP: 136/87 (12/20 2336) Pulse Rate: 101 (12/20 2336)  Labs: Recent Labs    11/13/24 0806 11/14/24 0334 11/14/24 1849 11/15/24 0534  HGB 10.1* 10.6*  --  10.1*  HCT 29.1* 30.8*  --  28.9*  PLT 220 233  --  181  HEPARINUNFRC  --   --  0.19*  --   CREATININE 1.90* 2.08*  --   --     Estimated Creatinine Clearance: 40.7 mL/min (A) (by C-G formula based on SCr of 2.08 mg/dL (H)).  Medical History: Past Medical History:  Diagnosis Date   Abscess    Asthma    CHF (congestive heart failure) (HCC)    COPD (chronic obstructive pulmonary disease) (HCC)    Coronary artery disease    Diabetes mellitus without complication (HCC)    Emphysema of lung (HCC)    Heart attack (HCC)    HIV (human immunodeficiency virus infection) (HCC)    Hypertension    Pneumonia    Assessment: 57 yof with a history of CAD. Patient is presenting with chest pain. Heparin  per pharmacy consult placed for chest pain/ACS. Patient is not on anticoagulation prior to arrival. Hgb 10.6; plt 233  AM: heparin  level 0.17 is subtherapeutic on 1100 units/hr. No issues with the infusion running continuously or bleeding reported per RN. CBC shows Hgb low, stable, plts 181  Goal of Therapy:  Heparin  level 0.3-0.7 units/ml Monitor platelets by anticoagulation protocol: Yes   Plan:  Re-bolus 2000 units IV x1 Increase heparin  infusion to 1400 units/hr Check anti-Xa level in 8 hours and daily while on heparin  Continue to monitor H&H and platelets  Lynwood Poplar, PharmD, BCPS Clinical Pharmacist 11/15/2024 6:09 AM         [1]  Allergies Allergen Reactions   Wellbutrin [Bupropion]  Shortness Of Breath    Difficulty breathing   Ozempic (0.25 Or 0.5 Mg-Dose) [Semaglutide(0.25 Or 0.5mg -Dos)] Nausea Only   Jardiance [Empagliflozin] Other (See Comments)    Yeast infections   Metformin And Related Other (See Comments)    Flu-like symptoms, muscle aches, body aches   Pantoprazole  Other (See Comments)    Made the patient feel terrible   Trulicity [Dulaglutide] Rash

## 2024-11-15 NOTE — Progress Notes (Signed)
 "  Renee Bryant  FMW:[id number] DOB: [birth year] DOA: 11/14/2024 PCP: Almarie Waddell NOVAK, NP    Brief Narrative:  43 year old with a history of HIV on Biktarvy , insulin -dependent DM, obesity, HLD, HTN, COPD, CKD stage IIIb CAD on DAPT, and chronic angina on Ranexa  who presented to the ER 12/20 with 2 to 3 days of chest pressure.  Initial troponins were 21 and 24.  Goals of Care:   Code Status: Full Code   DVT prophylaxis: Place TED hose Start: 11/14/24 0929   Interim Hx: Afebrile since presentation.  Vital signs stable.  No acute events recorded overnight.  Resting comfortably in bed.  Reports that her chest pain has greatly improved.  Denies shortness of breath.  Tolerating her diet without difficulty.  Assessment & Plan:  Chest pain - history of chronic angina - CAD with history of PCI Care per Cardiology -plan is for cardiac cath  Insulin -dependent DM A1c 7.03 October 2024 -CBG well-controlled  CKD stage IIIb Creatinine stable at baseline of approximately 1.8-2.0 - monitor intermittently  HIV infection Continue usual Biktarvy  dose  Mixed hyperlipidemia Continue usual atorvastatin  dose  HTN Continue usual home medications   Family Communication: No family present Disposition: Will depend upon results of cardiac cath   Objective: Blood pressure 112/66, pulse 82, temperature 98.9 F (37.2 C), temperature source Oral, resp. rate 18, height 5' 7 (1.702 m), weight 90.7 kg, last menstrual period 11/01/2024, SpO2 97%.  Intake/Output Summary (Last 24 hours) at 11/15/2024 0948 Last data filed at 11/14/2024 2336 Gross per 24 hour  Intake 400 ml  Output --  Net 400 ml   Filed Weights   11/14/24 0323  Weight: 90.7 kg    Examination: General: No acute respiratory distress Lungs: Clear to auscultation bilaterally without wheezes or crackles Cardiovascular: Regular rate and rhythm without murmur gallop or rub normal S1 and S2 Abdomen: Nontender, nondistended, soft, bowel  sounds positive, no rebound, no ascites, no appreciable mass Extremities: No significant cyanosis, clubbing, or edema bilateral lower extremities  CBC: Recent Labs  Lab 11/13/24 0806 11/14/24 0334 11/15/24 0534  WBC 8.8 9.8 8.1  NEUTROABS 5.7  --   --   HGB 10.1* 10.6* 10.1*  HCT 29.1* 30.8* 28.9*  MCV 97.3 96.9 98.6  PLT 220 233 181   Basic Metabolic Panel: Recent Labs  Lab 11/13/24 0806 11/14/24 0334 11/15/24 0534  NA 135 137 132*  K 4.5 4.3 4.5  CL 106 105 104  CO2 21* 23 21*  GLUCOSE 291* 209* 165*  BUN 30* 37* 29*  CREATININE 1.90* 2.08* 1.83*  CALCIUM  7.9* 8.3* 7.9*   GFR: Estimated Creatinine Clearance: 46.3 mL/min (A) (by C-G formula based on SCr of 1.83 mg/dL (H)).   Scheduled Meds:  aspirin   81 mg Oral Daily   atorvastatin   80 mg Oral QHS   bictegravir-emtricitabine -tenofovir  AF  1 tablet Oral QHS   carvedilol   3.125 mg Oral BID WC   clopidogrel   75 mg Oral Daily   famotidine   20 mg Oral BID AC   insulin  aspart  0-5 Units Subcutaneous QHS   insulin  aspart  0-9 Units Subcutaneous TID WC   insulin  glargine  40 Units Subcutaneous QHS   ranolazine   1,000 mg Oral BID   sacubitril -valsartan   1 tablet Oral BID   Continuous Infusions:  heparin  1,400 Units/hr (11/15/24 0622)   nitroGLYCERIN  23 mcg/min (11/14/24 2159)     LOS: 0 days   Renee IVAR Moores, MD [facility] Office  [phone] Pager -  Text Page per Tracey  If 7PM-7AM, please contact night-coverage per Amion 11/15/2024, 9:48 AM     "

## 2024-11-15 NOTE — Plan of Care (Signed)
  Problem: Clinical Measurements: Goal: Respiratory complications will improve Outcome: Progressing Goal: Cardiovascular complication will be avoided Outcome: Progressing   Problem: Activity: Goal: Risk for activity intolerance will decrease Outcome: Progressing   Problem: Nutrition: Goal: Adequate nutrition will be maintained Outcome: Progressing   Problem: Coping: Goal: Level of anxiety will decrease Outcome: Progressing   Problem: Elimination: Goal: Will not experience complications related to urinary retention Outcome: Progressing   Problem: Pain Managment: Goal: General experience of comfort will improve and/or be controlled Outcome: Progressing   Problem: Safety: Goal: Ability to remain free from injury will improve Outcome: Progressing

## 2024-11-15 NOTE — Progress Notes (Signed)
 ANTICOAGULATION CONSULT NOTE  Pharmacy Consult for Heparin  Indication: chest pain/ACS  Allergies[1]  Patient Measurements: Height: 5' 7 (170.2 cm) Weight: 90.7 kg (199 lb 15.3 oz) IBW/kg (Calculated) : 61.6 Heparin  Dosing Weight: 81.1 kg  Vital Signs: Temp: 99.2 F (37.3 C) (12/21 1223) Temp Source: Oral (12/21 1223) BP: 108/69 (12/21 1223) Pulse Rate: 81 (12/21 1223)  Labs: Recent Labs    11/13/24 0806 11/14/24 0334 11/14/24 1849 11/15/24 0534 11/15/24 1327  HGB 10.1* 10.6*  --  10.1*  --   HCT 29.1* 30.8*  --  28.9*  --   PLT 220 233  --  181  --   HEPARINUNFRC  --   --  0.19* 0.17* 0.49  CREATININE 1.90* 2.08*  --  1.83*  --     Estimated Creatinine Clearance: 46.3 mL/min (A) (by C-G formula based on SCr of 1.83 mg/dL (H)).  Medical History: Past Medical History:  Diagnosis Date   Abscess    Asthma    CHF (congestive heart failure) (HCC)    COPD (chronic obstructive pulmonary disease) (HCC)    Coronary artery disease    Diabetes mellitus without complication (HCC)    Emphysema of lung (HCC)    Heart attack (HCC)    HIV (human immunodeficiency virus infection) (HCC)    Hypertension    Pneumonia    Assessment: 63 yof with a history of CAD. Patient is presenting with chest pain. Heparin  per pharmacy consult placed for chest pain/ACS. Patient is not on anticoagulation prior to arrival. Hgb 10.6; plt 233  Heparin  level therapeutic at 0.49. Per RN no s/s bleeding and no interruptions to infusion. CBC stable. Continue.   Goal of Therapy:  Heparin  level 0.3-0.7 units/ml Monitor platelets by anticoagulation protocol: Yes   Plan:  Continue heparin  infusion to 1400 units/hr Check anti-Xa level in 8 hours and daily while on heparin  Continue to monitor H&H and platelets  Elma Fail, PharmD PGY1 Clinical Pharmacist [facility]  11/15/2024 2:25 PM          [1]  Allergies Allergen Reactions   Wellbutrin [Bupropion] Shortness Of  Breath    Difficulty breathing   Ozempic (0.25 Or 0.5 Mg-Dose) [Semaglutide(0.25 Or 0.5mg -Dos)] Nausea Only   Jardiance [Empagliflozin] Other (See Comments)    Yeast infections   Metformin And Related Other (See Comments)    Flu-like symptoms, muscle aches, body aches   Pantoprazole  Other (See Comments)    Made the patient feel terrible   Trulicity [Dulaglutide] Rash

## 2024-11-15 NOTE — Progress Notes (Signed)
 "  Progress Note  Patient Name: Renee Bryant Date of Encounter: 11/15/2024  Primary Cardiologist: Darryle ONEIDA Decent, MD   Subjective   Patient seen and examined at her bedside. She is still experiencing chest pain despite the nitroglycerin  gtt.   Inpatient Medications    Scheduled Meds:  aspirin   81 mg Oral Daily   atorvastatin   80 mg Oral QHS   bictegravir-emtricitabine -tenofovir  AF  1 tablet Oral QHS   carvedilol   3.125 mg Oral BID WC   clonazePAM   1 mg Oral BID   clopidogrel   75 mg Oral Daily   famotidine   20 mg Oral BID AC   insulin  aspart  0-9 Units Subcutaneous TID WC   insulin  glargine  40 Units Subcutaneous QHS   ranolazine   1,000 mg Oral BID   sacubitril -valsartan   1 tablet Oral BID   Continuous Infusions:  heparin  1,400 Units/hr (11/15/24 0622)   nitroGLYCERIN  23 mcg/min (11/14/24 2159)   PRN Meds: acetaminophen  **OR** [DISCONTINUED] acetaminophen , albuterol , hydrALAZINE , melatonin, morphine  injection, ondansetron  **OR** ondansetron  (ZOFRAN ) IV   Vital Signs    Vitals:   11/14/24 2202 11/14/24 2336 11/15/24 0802 11/15/24 0859  BP: (!) 145/105 136/87 102/65 112/66  Pulse:  (!) 101 82   Resp:  16 18   Temp:  99.6 F (37.6 C) 98.9 F (37.2 C)   TempSrc:  Oral Oral   SpO2:  98% 97%   Weight:      Height:        Intake/Output Summary (Last 24 hours) at 11/15/2024 1151 Last data filed at 11/14/2024 2336 Gross per 24 hour  Intake 400 ml  Output --  Net 400 ml   Filed Weights   11/14/24 0323  Weight: 90.7 kg    Telemetry    Sinus rhythm - Personally Reviewed  ECG     - Personally Reviewed  Physical Exam    General: Comfortable, Head: Atraumatic, normal size  Eyes: PEERLA, EOMI  Neck: Supple, normal JVD Cardiac: Normal S1, S2; RRR; no murmurs, rubs, or gallops Lungs: Clear to auscultation bilaterally Abd: Soft, nontender, no hepatomegaly  Ext: warm, no edema Musculoskeletal: No deformities, BUE and BLE strength normal and equal Skin:  Warm and dry, no rashes   Neuro: Alert and oriented to person, place, time, and situation, CNII-XII grossly intact, no focal deficits  Psych: Normal mood and affect   Labs    Chemistry Recent Labs  Lab 11/13/24 0806 11/14/24 0334 11/15/24 0534  NA 135 137 132*  K 4.5 4.3 4.5  CL 106 105 104  CO2 21* 23 21*  GLUCOSE 291* 209* 165*  BUN 30* 37* 29*  CREATININE 1.90* 2.08* 1.83*  CALCIUM  7.9* 8.3* 7.9*  PROT 5.3*  --   --   ALBUMIN 3.1*  --   --   AST 29  --   --   ALT 26  --   --   ALKPHOS 47  --   --   BILITOT 0.2  --   --   GFRNONAA 33* 30* 35*  ANIONGAP 9 9 7      Hematology Recent Labs  Lab 11/13/24 0806 11/14/24 0334 11/15/24 0534  WBC 8.8 9.8 8.1  RBC 2.99* 3.18* 2.93*  HGB 10.1* 10.6* 10.1*  HCT 29.1* 30.8* 28.9*  MCV 97.3 96.9 98.6  MCH 33.8 33.3 34.5*  MCHC 34.7 34.4 34.9  RDW 12.2 12.5 12.3  PLT 220 233 181    Cardiac EnzymesNo results for input(s): TROPONINI in the last 168 hours.  No results for input(s): TROPIPOC in the last 168 hours.   BNP Recent Labs  Lab 11/13/24 1045  PROBNP 62.2     DDimer No results for input(s): DDIMER in the last 168 hours.   Radiology    DG Chest 2 View Result Date: 11/14/2024 EXAM: 2 VIEW(S) XRAY OF THE CHEST 11/14/2024 03:55:00 AM COMPARISON: 11/13/2024. CLINICAL HISTORY: CP FINDINGS: LUNGS AND PLEURA: No focal pulmonary opacity. No pleural effusion. No pneumothorax. HEART AND MEDIASTINUM: No acute abnormality of the cardiac and mediastinal silhouettes. BONES AND SOFT TISSUES: No acute osseous abnormality. IMPRESSION: 1. No acute process. Electronically signed by: Norman Gatlin MD 11/14/2024 04:01 AM EST RP Workstation: [id number]    Cardiac Studies   Echo   Patient Profile     43 y.o. female with CAD and chest pain   Assessment & Plan    Elevated troponin suspecting unstable angina based on symptoms Coronary artery disease status post PCI Type 2 diabetes Hyperlipidemia Hypertension  She is  still experiencing chest pain despite nitro gtt, her echo with newly depressed ejection fraction and wall motion abnormalities. She will benefit an ischemic evaluation with a heart catheterization on Monday.   Informed Consent   Shared Decision Making/Informed Consent The risks [stroke (1 in 1000), death (1 in 1000), kidney failure [usually temporary] (1 in 500), bleeding (1 in 200), allergic reaction [possibly serious] (1 in 200)], benefits (diagnostic support and management of coronary artery disease) and alternatives of a cardiac catheterization were discussed in detail with Ms. Buley and she is willing to proceed.  Continue aspirin  81 mg daily with her Plavix  75 mg daily. She is on atorvastatin  80 mg we will continue this.   Clinically she appears to be euvolemic she is not in heart failure.     We just have to watch the cr its trending down.    Newly depressed ejection fraction - EF now 40-35% was normal in May. Has been on GDMT - restarted today.      For questions or updates, please contact [facility] Please consult [email or web address] for contact info under Cardiology/STEMI.      Signed, Ariea Rochin, DO  11/15/2024, 11:51 AM    "

## 2024-11-16 ENCOUNTER — Other Ambulatory Visit (HOSPITAL_COMMUNITY): Payer: Self-pay

## 2024-11-16 DIAGNOSIS — N1832 Chronic kidney disease, stage 3b: Secondary | ICD-10-CM | POA: Diagnosis not present

## 2024-11-16 DIAGNOSIS — I200 Unstable angina: Secondary | ICD-10-CM | POA: Diagnosis not present

## 2024-11-16 DIAGNOSIS — I5023 Acute on chronic systolic (congestive) heart failure: Secondary | ICD-10-CM

## 2024-11-16 DIAGNOSIS — Z8679 Personal history of other diseases of the circulatory system: Secondary | ICD-10-CM | POA: Diagnosis not present

## 2024-11-16 DIAGNOSIS — Z955 Presence of coronary angioplasty implant and graft: Secondary | ICD-10-CM | POA: Diagnosis not present

## 2024-11-16 DIAGNOSIS — R079 Chest pain, unspecified: Secondary | ICD-10-CM | POA: Diagnosis not present

## 2024-11-16 DIAGNOSIS — I10 Essential (primary) hypertension: Secondary | ICD-10-CM | POA: Diagnosis not present

## 2024-11-16 LAB — BASIC METABOLIC PANEL WITH GFR
Anion gap: 7 (ref 5–15)
BUN: 33 mg/dL — ABNORMAL HIGH (ref 6–20)
CO2: 23 mmol/L (ref 22–32)
Calcium: 8.1 mg/dL — ABNORMAL LOW (ref 8.9–10.3)
Chloride: 104 mmol/L (ref 98–111)
Creatinine, Ser: 2.13 mg/dL — ABNORMAL HIGH (ref 0.44–1.00)
GFR, Estimated: 29 mL/min — ABNORMAL LOW
Glucose, Bld: 121 mg/dL — ABNORMAL HIGH (ref 70–99)
Potassium: 4.2 mmol/L (ref 3.5–5.1)
Sodium: 134 mmol/L — ABNORMAL LOW (ref 135–145)

## 2024-11-16 LAB — GLUCOSE, CAPILLARY
Glucose-Capillary: 142 mg/dL — ABNORMAL HIGH (ref 70–99)
Glucose-Capillary: 112 mg/dL — ABNORMAL HIGH (ref 70–99)
Glucose-Capillary: 162 mg/dL — ABNORMAL HIGH (ref 70–99)
Glucose-Capillary: 122 mg/dL — ABNORMAL HIGH (ref 70–99)

## 2024-11-16 LAB — HEPARIN LEVEL (UNFRACTIONATED): Heparin Unfractionated: 0.48 [IU]/mL (ref 0.30–0.70)

## 2024-11-16 LAB — CBC
HCT: 28.2 % — ABNORMAL LOW (ref 36.0–46.0)
Hemoglobin: 9.9 g/dL — ABNORMAL LOW (ref 12.0–15.0)
MCH: 34.6 pg — ABNORMAL HIGH (ref 26.0–34.0)
MCHC: 35.1 g/dL (ref 30.0–36.0)
MCV: 98.6 fL (ref 80.0–100.0)
Platelets: 187 K/uL (ref 150–400)
RBC: 2.86 MIL/uL — ABNORMAL LOW (ref 3.87–5.11)
RDW: 12.5 % (ref 11.5–15.5)
WBC: 5.2 K/uL (ref 4.0–10.5)
nRBC: 0 % (ref 0.0–0.2)

## 2024-11-16 MED ORDER — SODIUM CHLORIDE 0.9 % IV SOLN: INTRAVENOUS | Status: DC

## 2024-11-16 MED ORDER — CARMEX CLASSIC LIP BALM EX OINT: 1.0000 | TOPICAL_OINTMENT | CUTANEOUS | Status: AC | PRN

## 2024-11-16 MED ORDER — MORPHINE SULFATE (PF) 2 MG/ML IV SOLN
2.0000 mg | INTRAVENOUS | Status: DC | PRN
  Administered 2024-11-16 – 2024-11-18 (×4): 2 mg via INTRAVENOUS
  Filled 2024-11-16 (×4): qty 1

## 2024-11-16 MED ORDER — TIRZEPATIDE 10 MG/0.5ML ~~LOC~~ SOAJ
10.0000 mg | SUBCUTANEOUS | Status: AC
  Administered 2024-11-16: 10 mg via SUBCUTANEOUS
  Filled 2024-11-16: qty 0.5

## 2024-11-16 NOTE — Progress Notes (Addendum)
 ANTICOAGULATION CONSULT NOTE  Pharmacy Consult for Heparin  Indication: chest pain/ACS  Allergies[1]  Patient Measurements: Height: 5' 7 (170.2 cm) Weight: 90.7 kg (199 lb 15.3 oz) IBW/kg (Calculated) : 61.6 Heparin  Dosing Weight: 81.1 kg  Vital Signs: Temp: 98.2 F (36.8 C) (12/22 0410) Temp Source: Oral (12/22 0410) BP: 107/60 (12/22 0410) Pulse Rate: 77 (12/22 0410)  Labs: Recent Labs    11/14/24 0334 11/14/24 1849 11/15/24 0534 11/15/24 1327 11/15/24 2003 11/16/24 0551  HGB 10.6*  --  10.1*  --   --  9.9*  HCT 30.8*  --  28.9*  --   --  28.2*  PLT 233  --  181  --   --  187  HEPARINUNFRC  --    < > 0.17* 0.49 0.46 0.48  CREATININE 2.08*  --  1.83*  --   --  2.13*   < > = values in this interval not displayed.    Estimated Creatinine Clearance: 39.8 mL/min (A) (by C-G formula based on SCr of 2.13 mg/dL (H)).  Medical History: Past Medical History:  Diagnosis Date   Abscess    Asthma    CHF (congestive heart failure) (HCC)    COPD (chronic obstructive pulmonary disease) (HCC)    Coronary artery disease    Diabetes mellitus without complication (HCC)    Emphysema of lung (HCC)    Heart attack (HCC)    HIV (human immunodeficiency virus infection) (HCC)    Hypertension    Pneumonia    Assessment: 21 yof with a history of CAD. Patient is presenting with chest pain. Heparin  per pharmacy consult placed for chest pain/ACS. Patient is not on anticoagulation prior to arrival.  Heparin  level 0.48 is therapeutic with heparin  running at 1400 units/hr. Hgb (9.9) and PLTs (187) are stable. Per RN, no report of pauses, issues with the line, or signs of bleeding.   Patient to go for heart cath tomorrow for ischemic evaluation.  Goal of Therapy:  Heparin  level 0.3-0.7 units/ml Monitor platelets by anticoagulation protocol: Yes   Plan:  Continue heparin  infusion to at 1400 units/hr Check anti-Xa level daily while on heparin  Continue to monitor H&H and  platelets F/u heparin  plans after heart catheterization tomorrow  Thank you for allowing pharmacy to be a part of this patients care.   Nidia Schaffer, PharmD PGY2 Cardiology Pharmacy Resident  Please check AMION for all [facility] Pharmacy phone numbers After 10:00 PM, call Main Pharmacy [phone] 11/16/2024 7:35 AM     [1]  Allergies Allergen Reactions   Wellbutrin [Bupropion] Shortness Of Breath    Difficulty breathing   Ozempic (0.25 Or 0.5 Mg-Dose) [Semaglutide(0.25 Or 0.5mg -Dos)] Nausea Only   Jardiance [Empagliflozin] Other (See Comments)    Yeast infections   Metformin And Related Other (See Comments)    Flu-like symptoms, muscle aches, body aches   Pantoprazole  Other (See Comments)    Made the patient feel terrible   Trulicity [Dulaglutide] Rash

## 2024-11-16 NOTE — Plan of Care (Signed)
" °  Problem: Education: Goal: Ability to describe self-care measures that may prevent or decrease complications (Diabetes Survival Skills Education) will improve Outcome: Progressing Goal: Individualized Educational Video(s) Outcome: Progressing   Problem: Coping: Goal: Ability to adjust to condition or change in health will improve Outcome: Progressing   Problem: Health Behavior/Discharge Planning: Goal: Ability to identify and utilize available resources and services will improve Outcome: Progressing Goal: Ability to manage health-related needs will improve Outcome: Progressing   Problem: Metabolic: Goal: Ability to maintain appropriate glucose levels will improve Outcome: Progressing   Problem: Nutritional: Goal: Maintenance of adequate nutrition will improve Outcome: Progressing Goal: Progress toward achieving an optimal weight will improve Outcome: Progressing   Problem: Skin Integrity: Goal: Risk for impaired skin integrity will decrease Outcome: Progressing   Problem: Tissue Perfusion: Goal: Adequacy of tissue perfusion will improve Outcome: Progressing   Problem: Education: Goal: Knowledge of General Education information will improve Description: Including pain rating scale, medication(s)/side effects and non-pharmacologic comfort measures Outcome: Progressing   Problem: Health Behavior/Discharge Planning: Goal: Ability to manage health-related needs will improve Outcome: Progressing   Problem: Clinical Measurements: Goal: Ability to maintain clinical measurements within normal limits will improve Outcome: Progressing Goal: Will remain free from infection Outcome: Progressing Goal: Diagnostic test results will improve Outcome: Progressing Goal: Respiratory complications will improve Outcome: Progressing Goal: Cardiovascular complication will be avoided Outcome: Progressing   Problem: Nutrition: Goal: Adequate nutrition will be maintained Outcome:  Progressing   Problem: Coping: Goal: Level of anxiety will decrease Outcome: Progressing   Problem: Pain Managment: Goal: General experience of comfort will improve and/or be controlled Outcome: Progressing   Problem: Safety: Goal: Ability to remain free from injury will improve Outcome: Progressing   Problem: Skin Integrity: Goal: Risk for impaired skin integrity will decrease Outcome: Progressing   Problem: Activity: Goal: Ability to return to baseline activity level will improve Outcome: Progressing   Problem: Cardiovascular: Goal: Ability to achieve and maintain adequate cardiovascular perfusion will improve Outcome: Progressing Goal: Vascular access site(s) Level 0-1 will be maintained Outcome: Progressing   Problem: Health Behavior/Discharge Planning: Goal: Ability to safely manage health-related needs after discharge will improve Outcome: Progressing   "

## 2024-11-16 NOTE — Progress Notes (Signed)
 "  Rounding Note   Patient Name: Renee Bryant Date of Encounter: 11/16/2024  [facility] Cardiologist: Darryle ONEIDA Decent, MD   Subjective  On exam, she is on heparin  and NTG drips with intermittent morphine , chest pain still 4/10.   Scheduled Meds:  aspirin   81 mg Oral Daily   atorvastatin   80 mg Oral QHS   bictegravir-emtricitabine -tenofovir  AF  1 tablet Oral QHS   carvedilol   3.125 mg Oral BID WC   clonazePAM   1 mg Oral BID   clopidogrel   75 mg Oral Daily   famotidine   20 mg Oral BID AC   insulin  aspart  0-9 Units Subcutaneous TID WC   insulin  glargine  40 Units Subcutaneous QHS   ranolazine   1,000 mg Oral BID   sacubitril -valsartan   1 tablet Oral BID   tirzepatide   10 mg Subcutaneous Weekly   Continuous Infusions:  sodium chloride  50 mL/hr at 11/16/24 0511   heparin  1,400 Units/hr (11/15/24 2312)   nitroGLYCERIN  25 mcg/min (11/16/24 0128)   PRN Meds: acetaminophen  **OR** [DISCONTINUED] acetaminophen , albuterol , diphenhydrAMINE , hydrALAZINE , melatonin, ondansetron  **OR** ondansetron  (ZOFRAN ) IV   Vital Signs  Vitals:   11/15/24 2035 11/15/24 2315 11/16/24 0410 11/16/24 0746  BP: 113/76 117/76 107/60 109/67  Pulse: 77 90 77 90  Resp: 18 19 18 15   Temp: 98.3 F (36.8 C) 98 F (36.7 C) 98.2 F (36.8 C) 98.3 F (36.8 C)  TempSrc: Oral Oral Oral Oral  SpO2:   99% 95%  Weight:      Height:        Intake/Output Summary (Last 24 hours) at 11/16/2024 1107 Last data filed at 11/15/2024 2030 Gross per 24 hour  Intake 360 ml  Output --  Net 360 ml      11/14/2024    3:23 AM 11/13/2024    8:08 AM 10/05/2024    2:45 PM  Last 3 Weights  Weight (lbs) 199 lb 15.3 oz 200 lb 210 lb 3.2 oz  Weight (kg) 90.7 kg 90.719 kg 95.346 kg      Telemetry SR with HR 80s - Personally Reviewed  ECG  No new tracings - will repeat EKG given ongoing chest pain - Personally Reviewed  Physical Exam  GEN: No acute distress.   Neck: No JVD Cardiac: RRR, no murmurs,  rubs, or gallops.  Respiratory: Clear to auscultation bilaterally. GI: Soft, nontender, non-distended  MS: No edema; No deformity. Neuro:  Nonfocal  Psych: Normal affect   Labs High Sensitivity Troponin:  No results for input(s): TROPONINIHS in the last 720 hours.  Recent Labs  Lab 11/13/24 0806 11/13/24 1045 11/14/24 0334 11/14/24 0532  TRNPT 20* 16 21* 24*       Chemistry Recent Labs  Lab 11/13/24 0806 11/14/24 0334 11/15/24 0534 11/16/24 0551  NA 135 137 132* 134*  K 4.5 4.3 4.5 4.2  CL 106 105 104 104  CO2 21* 23 21* 23  GLUCOSE 291* 209* 165* 121*  BUN 30* 37* 29* 33*  CREATININE 1.90* 2.08* 1.83* 2.13*  CALCIUM  7.9* 8.3* 7.9* 8.1*  PROT 5.3*  --   --   --   ALBUMIN 3.1*  --   --   --   AST 29  --   --   --   ALT 26  --   --   --   ALKPHOS 47  --   --   --   BILITOT 0.2  --   --   --   GFRNONAA 33*  30* 35* 29*  ANIONGAP 9 9 7 7     Lipids No results for input(s): CHOL, TRIG, HDL, LABVLDL, LDLCALC, CHOLHDL in the last 168 hours.  Hematology Recent Labs  Lab 11/14/24 0334 11/15/24 0534 11/16/24 0551  WBC 9.8 8.1 5.2  RBC 3.18* 2.93* 2.86*  HGB 10.6* 10.1* 9.9*  HCT 30.8* 28.9* 28.2*  MCV 96.9 98.6 98.6  MCH 33.3 34.5* 34.6*  MCHC 34.4 34.9 35.1  RDW 12.5 12.3 12.5  PLT 233 181 187   Thyroid  No results for input(s): TSH, FREET4 in the last 168 hours.  BNP Recent Labs  Lab 11/13/24 1045  PROBNP 62.2    DDimer No results for input(s): DDIMER in the last 168 hours.   Radiology  ECHOCARDIOGRAM COMPLETE Result Date: 11/15/2024    ECHOCARDIOGRAM REPORT   Patient Name:   Renee Bryant Date of Exam: 11/15/2024 Medical Rec #:  [id number]       Height:       67.0 in Accession #:    [id number]      Weight:       200.0 lb Date of Birth:  [birth year]       BSA:          2.022 m Patient Age:    43 years        BP:           136/97 mmHg Patient Gender: F               HR:           78 bpm. Exam Location:  Inpatient Procedure: 2D Echo,  Cardiac Doppler, Color Doppler and Intracardiac            Opacification Agent (Both Spectral and Color Flow Doppler were            utilized during procedure). Indications:    R07.9* Chest pain, unspecified  History:        Patient has prior history of Echocardiogram examinations, most                 recent 03/30/2024. CAD; Risk Factors:Hypertension, Diabetes and                 Dyslipidemia.  Sonographer:    Damien Senior RDCS Referring Phys: [id number] AMY N [facility] IMPRESSIONS  1. Left ventricular ejection fraction, by estimation, is 40 to 45%. Left ventricular ejection fraction by 2D MOD biplane is 43.4 %. The left ventricle has mildly decreased function. The left ventricle demonstrates regional wall motion abnormalities (see  scoring diagram/findings for description). There is mild concentric left ventricular hypertrophy. Left ventricular diastolic parameters were normal.  2. Right ventricular systolic function is normal. The right ventricular size is normal. Tricuspid regurgitation signal is inadequate for assessing PA pressure.  3. The mitral valve is normal in structure. Trivial mitral valve regurgitation.  4. The aortic valve has an indeterminant number of cusps. Aortic valve regurgitation is not visualized.  5. The inferior vena cava is normal in size with greater than 50% respiratory variability, suggesting right atrial pressure of 3 mmHg. FINDINGS  Left Ventricle: Left ventricular ejection fraction, by estimation, is 40 to 45%. Left ventricular ejection fraction by 2D MOD biplane is 43.4 %. The left ventricle has mildly decreased function. The left ventricle demonstrates regional wall motion abnormalities. Definity  contrast agent was given IV to delineate the left ventricular endocardial borders. The left ventricular internal cavity size was normal in size. There is mild concentric left ventricular  hypertrophy. Left ventricular diastolic parameters were normal.  LV Wall Scoring: The apical septal segment, apical  anterior segment, apical inferior segment, and apex are akinetic. The mid anteroseptal segment, apical lateral segment, and mid inferoseptal segment are hypokinetic. Right Ventricle: The right ventricular size is normal. No increase in right ventricular wall thickness. Right ventricular systolic function is normal. Tricuspid regurgitation signal is inadequate for assessing PA pressure. Left Atrium: Left atrial size was normal in size. Right Atrium: Right atrial size was normal in size. Pericardium: There is no evidence of pericardial effusion. Mitral Valve: The mitral valve is normal in structure. Trivial mitral valve regurgitation. Tricuspid Valve: The tricuspid valve is normal in structure. Tricuspid valve regurgitation is trivial. Aortic Valve: The aortic valve has an indeterminant number of cusps. Aortic valve regurgitation is not visualized. Pulmonic Valve: The pulmonic valve was normal in structure. Pulmonic valve regurgitation is trivial. Aorta: The aortic root and ascending aorta are structurally normal, with no evidence of dilitation. Venous: The inferior vena cava is normal in size with greater than 50% respiratory variability, suggesting right atrial pressure of 3 mmHg. IAS/Shunts: No atrial level shunt detected by color flow Doppler.  LEFT VENTRICLE PLAX 2D                        Biplane EF (MOD) LVIDd:         4.30 cm         LV Biplane EF:   Left LVIDs:         2.80 cm                          ventricular LV PW:         1.20 cm                          ejection LV IVS:        1.20 cm                          fraction by LVOT diam:     2.30 cm                          2D MOD LV SV:         87                               biplane is LV SV Index:   43                               43.4 %. LVOT Area:     4.15 cm                                Diastology                                LV e' medial:    8.05 cm/s LV Volumes (MOD)               LV E/e' medial:  6.6 LV vol d, MOD    107.0 ml  LV e'  lateral:   11.40 cm/s A2C:                           LV E/e' lateral: 4.6 LV vol d, MOD    129.0 ml A4C: LV vol s, MOD    58.9 ml A2C: LV vol s, MOD    72.8 ml A4C: LV SV MOD A2C:   48.1 ml LV SV MOD A4C:   129.0 ml LV SV MOD BP:    51.0 ml RIGHT VENTRICLE RV S prime:     11.60 cm/s  PULMONARY VEINS TAPSE (M-mode): 1.6 cm      Diastolic Velocity: 48.30 cm/s                             S/D Velocity:       1.40                             Systolic Velocity:  65.70 cm/s LEFT ATRIUM             Index        RIGHT ATRIUM           Index LA diam:        3.60 cm 1.78 cm/m   RA Area:     12.20 cm LA Vol (A2C):   45.7 ml 22.60 ml/m  RA Volume:   28.70 ml  14.19 ml/m LA Vol (A4C):   41.4 ml 20.47 ml/m LA Biplane Vol: 46.3 ml 22.90 ml/m  AORTIC VALVE LVOT Vmax:   103.00 cm/s LVOT Vmean:  90.700 cm/s LVOT VTI:    0.209 m  AORTA Ao Root diam: 3.10 cm Ao Asc diam:  2.70 cm MITRAL VALVE MV Area (PHT): 2.77 cm    SHUNTS MV Decel Time: 274 msec    Systemic VTI:  0.21 m MV E velocity: 52.90 cm/s  Systemic Diam: 2.30 cm MV A velocity: 52.40 cm/s MV E/A ratio:  1.01 Kardie Tobb DO Electronically signed by Dub Huntsman DO Signature Date/Time: 11/15/2024/12:56:11 PM    Final     Cardiac Studies  Echo 11/15/24:  1. Left ventricular ejection fraction, by estimation, is 40 to 45%. Left  ventricular ejection fraction by 2D MOD biplane is 43.4 %. The left  ventricle has mildly decreased function. The left ventricle demonstrates  regional wall motion abnormalities (see   scoring diagram/findings for description). There is mild concentric left  ventricular hypertrophy. Left ventricular diastolic parameters were  normal.   2. Right ventricular systolic function is normal. The right ventricular  size is normal. Tricuspid regurgitation signal is inadequate for assessing  PA pressure.   3. The mitral valve is normal in structure. Trivial mitral valve  regurgitation.   4. The aortic valve has an indeterminant number of cusps.  Aortic valve  regurgitation is not visualized.   5. The inferior vena cava is normal in size with greater than 50%  respiratory variability, suggesting right atrial pressure of 3 mmHg.   Patient Profile    43 y.o. female with a hx of CAD with 90% D1 stenosis treated medically, diabetes type 2, HIV, heart failure with improved ejection fraction, hypertension, COPD who is being seen 11/14/2024 for the evaluation of discomfort   Exercise stress test 05/2024 inconclusive due to sub-maximum exercise, test stopped due to dyspnea.    Assessment &  Plan   Elevated troponin Chest pain - CP suspicious for angina, given hx of CAD treated medically initially planned for [employer] but bump in creatinine today - hs troponin 20 --> 16 --> 21 --> 24 - continue plavix  - hx of chronic angina treated with NTG and ranexa  - continue heparin  gtt and NTG drip - will attempt to uptitrate NTG for ongoing chest pain, now 4/10 - also repeating an EKG - holding entresto  to hopefully improve sCr tomorrow allowing for LHC   CAD - 90% ostial diagonal disease on heart cath 04/2023 treated medically due to location of lesion and risk of plaque shift/impingement of LAD - was started on BB and ranexa  after heart cath in 04/2023 - continue these   Acute on chronic systolic heart failure - echo 01/7974 with LVEF 45-50% with distal septal apical hypokinesis - follow up echo 03/2024 with improved LVEF 55-60% and no RWMA - echo this admission 11/15/24 with LVEF 40-45% and WMA, LVH, normal RV - hold home 24-26 mg entresto  BID  - continue 3.125 mg coreg  BID   Acute on chronic renal insufficiency - sCr 2.13 (1.83) - baseline 1.8-2.0 - will hold entresto  and check renal function tomorrow   Plan for R/L heart cath tomorrow pending renal function       For questions or updates, please contact [facility] Please consult [email or web address] for contact info under       Signed, Jon Nat Hails, PA  11/16/2024,  11:07 AM    "

## 2024-11-16 NOTE — TOC Initial Note (Signed)
 Transition of Care [facility]) - Initial/Assessment Note    Patient Details  Name: Renee Bryant MRN: [id number] Date of Birth: Jun 20, 1981  [facility] CM/SW Contact:    Sudie Erminio Deems, RN Phone Number: 11/16/2024, 4:12 PM  Clinical Narrative: Risk for readmission assessment competed. Patient presented for chest pain. PTA patient was from home with family support. Patient has [employer] and PCP. Patient states she has no issues with transportation.  ICM will continue to follow for additional needs as the patient progresses.               Expected Discharge Plan: Home/Self Care Barriers to Discharge: No Barriers Identified   Patient Goals and CMS Choice Patient states their goals for this hospitalization and ongoing recovery are:: Plan to return home once stable.   Choice offered to / list presented to : NA      Expected Discharge Plan and Services In-house Referral: NA Discharge Planning Services: CM Consult Post Acute Care Choice: NA Living arrangements for the past 2 months: Single Family Home                   DME Agency: NA       HH Arranged: NA          Prior Living Arrangements/Services Living arrangements for the past 2 months: Single Family Home Lives with:: Parents Patient language and need for interpreter reviewed:: Yes Do you feel safe going back to the place where you live?: Yes      Need for Family Participation in Patient Care: No (Comment) Care giver support system in place?: No (comment)   Criminal Activity/Legal Involvement Pertinent to Current Situation/Hospitalization: No - Comment as needed  Activities of Daily Living   ADL Screening (condition at time of admission) Independently performs ADLs?: Yes (appropriate for developmental age) Is the patient deaf or have difficulty hearing?: No Does the patient have difficulty seeing, even when wearing glasses/contacts?: No Does the patient have difficulty  concentrating, remembering, or making decisions?: No  Permission Sought/Granted Permission sought to share information with : Family Supports, Case Manager Permission granted to share information with : Yes, Verbal Permission Granted              Emotional Assessment Appearance:: Appears stated age Attitude/Demeanor/Rapport: Engaged Affect (typically observed): Appropriate Orientation: : Oriented to Self, Oriented to Place, Oriented to  Time, Oriented to Situation Alcohol / Substance Use: Not Applicable Psych Involvement: No (comment)  Admission diagnosis:  History of coronary artery disease [Z86.79] Chest pain [R07.9] Chest pain, unspecified type [R07.9] Patient Active Problem List   Diagnosis Date Noted   Chest pain 11/14/2024   CKD stage 3b, GFR 30-44 ml/min (HCC) 11/14/2024   Insulin  dependent type 2 diabetes mellitus (HCC) 11/14/2024   Cardiac angina 11/14/2024   CAD (coronary artery disease) 11/14/2024   AKI (acute kidney injury) 05/11/2024   Vitamin D  deficiency 07/08/2023   Asthma 07/05/2023   NSTEMI (non-ST elevated myocardial infarction) (HCC) 05/02/2023   HIV (human immunodeficiency virus infection) (HCC) 05/02/2023   Essential hypertension 05/02/2023   Uncontrolled type 2 diabetes mellitus with hyperglycemia, with long-term current use of insulin  (HCC) 05/02/2023   CKD stage 3a, GFR 45-59 ml/min (HCC) 05/02/2023   Hypokalemia 11/29/2022   Fibroid 08/24/2021   DUB (dysfunctional uterine bleeding) 08/24/2021   Condyloma acuminatum in female 08/03/2021   Mixed hyperlipidemia 11/23/2019   Diabetic polyneuropathy associated with type 2 diabetes mellitus (HCC) 10/08/2019   LGSIL on Pap smear of  cervix 07/16/2016   Neuropathy 06/18/2014   Esophageal reflux 06/18/2014   Mood disorder 07/23/2012   PCP:  Almarie Waddell NOVAK, NP Pharmacy:   [facility] - [place], [place] - [address] [place] [address] Phone: [phone] Fax:  [phone]     Social Drivers of Health (SDOH) Social History: SDOH Screenings   Food Insecurity: No Food Insecurity (11/14/2024)  Housing: Low Risk (11/14/2024)  Transportation Needs: No Transportation Needs (11/14/2024)  Utilities: Not At Risk (11/14/2024)  Depression (PHQ2-9): Medium Risk (10/05/2024)  Social Connections: Unknown (04/03/2022)   Received from [facility]  Tobacco Use: Medium Risk (11/14/2024)   SDOH Interventions:     Readmission Risk Interventions    11/16/2024    4:11 PM 05/12/2024   10:29 AM  Readmission Risk Prevention Plan  Transportation Screening Complete Complete  PCP or Specialist Appt within 5-7 Days  Complete  PCP or Specialist Appt within 3-5 Days Complete   Home Care Screening  Complete  Medication Review (RN CM)  Complete  HRI or Home Care Consult Complete   Social Work Consult for Recovery Care Planning/Counseling Complete   Palliative Care Screening Not Applicable   Medication Review Oceanographer) Referral to Pharmacy

## 2024-11-16 NOTE — Progress Notes (Addendum)
 "  Renee Bryant  FMW:[id number] DOB: 05-20-1981 DOA: 11/14/2024 PCP: Almarie Waddell NOVAK, NP    Brief Narrative:  43 year old with a history of HIV on Biktarvy , insulin -dependent DM, obesity, HLD, HTN, COPD, CKD stage IIIb CAD on DAPT, and chronic angina on Ranexa  who presented to the ER 12/20 with 2 to 3 days of chest pressure.  Initial troponins were 21 and 24.  Goals of Care:   Code Status: Full Code   DVT prophylaxis: Place TED hose Start: 11/14/24 0929   Interim Hx: No acute events recorded overnight.  Afebrile.  Vital signs stable.  Some ongoing chest pain that is reasonably managed with a combination of nitroglycerin  and morphine .  Assessment & Plan:  Unstable angina - CAD with history of PCI Care per Cardiology - awaiting cardiac cath  Newly diagnosed systolic CHF TTE 87/78 noted EF 40-45% with regional LV WMA -likely ischemic in etiology -awaiting cardiac cath to guide medical therapy  Insulin -dependent DM A1c 7.03 October 2024 -CBG well-controlled  CKD stage IIIb Creatinine stable at baseline of approximately 1.8-2.0 - monitor intermittently -no evidence of AKI at present based on review of baseline creatinines dating back 6 months  HIV infection - asymptomatic  Continue usual Biktarvy  dose  Mixed hyperlipidemia Continue usual atorvastatin  dose  HTN Continue usual home medications   Family Communication: No family present Disposition: Will depend upon results of cardiac cath   Objective: Blood pressure 109/67, pulse 90, temperature 98.3 F (36.8 C), temperature source Oral, resp. rate 15, height 5' 7 (1.702 m), weight 90.7 kg, last menstrual period 11/01/2024, SpO2 95%.  Intake/Output Summary (Last 24 hours) at 11/16/2024 0944 Last data filed at 11/15/2024 2030 Gross per 24 hour  Intake 360 ml  Output --  Net 360 ml   Filed Weights   11/14/24 0323  Weight: 90.7 kg    Examination: General: No acute respiratory distress Lungs: Clear to auscultation  bilaterally without wheezes or crackles Cardiovascular: RRR without murmur Abdomen: NT/ND, soft, BS positive, no rebound Extremities: No significant cyanosis, clubbing, or edema bilateral lower extremities  CBC: Recent Labs  Lab 11/13/24 0806 11/14/24 0334 11/15/24 0534 11/16/24 0551  WBC 8.8 9.8 8.1 5.2  NEUTROABS 5.7  --   --   --   HGB 10.1* 10.6* 10.1* 9.9*  HCT 29.1* 30.8* 28.9* 28.2*  MCV 97.3 96.9 98.6 98.6  PLT 220 233 181 187   Basic Metabolic Panel: Recent Labs  Lab 11/14/24 0334 11/15/24 0534 11/16/24 0551  NA 137 132* 134*  K 4.3 4.5 4.2  CL 105 104 104  CO2 23 21* 23  GLUCOSE 209* 165* 121*  BUN 37* 29* 33*  CREATININE 2.08* 1.83* 2.13*  CALCIUM  8.3* 7.9* 8.1*   GFR: Estimated Creatinine Clearance: 39.8 mL/min (A) (by C-G formula based on SCr of 2.13 mg/dL (H)).   Scheduled Meds:  aspirin   81 mg Oral Daily   atorvastatin   80 mg Oral QHS   bictegravir-emtricitabine -tenofovir  AF  1 tablet Oral QHS   carvedilol   3.125 mg Oral BID WC   clonazePAM   1 mg Oral BID   clopidogrel   75 mg Oral Daily   famotidine   20 mg Oral BID AC   insulin  aspart  0-9 Units Subcutaneous TID WC   insulin  glargine  40 Units Subcutaneous QHS   ranolazine   1,000 mg Oral BID   sacubitril -valsartan   1 tablet Oral BID   Continuous Infusions:  sodium chloride  50 mL/hr at 11/16/24 0511   heparin  1,400 Units/hr (11/15/24  2312)   nitroGLYCERIN  25 mcg/min (11/16/24 0128)     LOS: 1 day   Reyes IVAR Moores, MD [facility] Office  [phone] Pager - Text Page per Amion  If 7PM-7AM, please contact night-coverage per Amion 11/16/2024, 9:44 AM     "

## 2024-11-16 NOTE — Plan of Care (Signed)

## 2024-11-17 ENCOUNTER — Inpatient Hospital Stay (HOSPITAL_COMMUNITY)
Admission: EM | Disposition: A | Discharge status: Home / Self Care | Payer: Self-pay | Source: Home / Self Care | Attending: Internal Medicine

## 2024-11-17 DIAGNOSIS — I200 Unstable angina: Secondary | ICD-10-CM | POA: Diagnosis not present

## 2024-11-17 DIAGNOSIS — I10 Essential (primary) hypertension: Secondary | ICD-10-CM

## 2024-11-17 DIAGNOSIS — I25110 Atherosclerotic heart disease of native coronary artery with unstable angina pectoris: Secondary | ICD-10-CM

## 2024-11-17 DIAGNOSIS — N1832 Chronic kidney disease, stage 3b: Secondary | ICD-10-CM | POA: Diagnosis not present

## 2024-11-17 DIAGNOSIS — R079 Chest pain, unspecified: Secondary | ICD-10-CM | POA: Diagnosis not present

## 2024-11-17 DIAGNOSIS — Z8679 Personal history of other diseases of the circulatory system: Secondary | ICD-10-CM | POA: Diagnosis not present

## 2024-11-17 HISTORY — PX: CORONARY STENT INTERVENTION: CATH118234

## 2024-11-17 HISTORY — PX: LEFT HEART CATH AND CORONARY ANGIOGRAPHY: CATH118249

## 2024-11-17 LAB — CBC
HCT: 27.3 % — ABNORMAL LOW (ref 36.0–46.0)
Hemoglobin: 9.5 g/dL — ABNORMAL LOW (ref 12.0–15.0)
MCH: 33.9 pg (ref 26.0–34.0)
MCHC: 34.8 g/dL (ref 30.0–36.0)
MCV: 97.5 fL (ref 80.0–100.0)
Platelets: 194 K/uL (ref 150–400)
RBC: 2.8 MIL/uL — ABNORMAL LOW (ref 3.87–5.11)
RDW: 12.3 % (ref 11.5–15.5)
WBC: 6.3 K/uL (ref 4.0–10.5)
nRBC: 0 % (ref 0.0–0.2)

## 2024-11-17 LAB — BASIC METABOLIC PANEL WITH GFR
Anion gap: 8 (ref 5–15)
BUN: 32 mg/dL — ABNORMAL HIGH (ref 6–20)
CO2: 21 mmol/L — ABNORMAL LOW (ref 22–32)
Calcium: 8.1 mg/dL — ABNORMAL LOW (ref 8.9–10.3)
Chloride: 106 mmol/L (ref 98–111)
Creatinine, Ser: 2.04 mg/dL — ABNORMAL HIGH (ref 0.44–1.00)
GFR, Estimated: 31 mL/min — ABNORMAL LOW
Glucose, Bld: 112 mg/dL — ABNORMAL HIGH (ref 70–99)
Potassium: 4.4 mmol/L (ref 3.5–5.1)
Sodium: 135 mmol/L (ref 135–145)

## 2024-11-17 LAB — GLUCOSE, CAPILLARY
Glucose-Capillary: 113 mg/dL — ABNORMAL HIGH (ref 70–99)
Glucose-Capillary: 102 mg/dL — ABNORMAL HIGH (ref 70–99)
Glucose-Capillary: 94 mg/dL (ref 70–99)
Glucose-Capillary: 110 mg/dL — ABNORMAL HIGH (ref 70–99)
Glucose-Capillary: 211 mg/dL — ABNORMAL HIGH (ref 70–99)

## 2024-11-17 LAB — HEPARIN LEVEL (UNFRACTIONATED): Heparin Unfractionated: 0.36 [IU]/mL (ref 0.30–0.70)

## 2024-11-17 MED ORDER — SODIUM CHLORIDE 0.9 % IV SOLN
INTRAVENOUS | Status: AC | PRN
  Administered 2024-11-17: 20 mL/h via INTRAVENOUS

## 2024-11-17 MED ORDER — VERAPAMIL HCL 2.5 MG/ML IV SOLN
INTRAVENOUS | Status: AC
  Filled 2024-11-17: qty 2

## 2024-11-17 MED ORDER — HYDRALAZINE HCL 20 MG/ML IJ SOLN: 10.0000 mg | INTRAMUSCULAR | Status: AC | PRN

## 2024-11-17 MED ORDER — HEPARIN SODIUM (PORCINE) 1000 UNIT/ML IJ SOLN
INTRAMUSCULAR | Status: AC
  Filled 2024-11-17: qty 10

## 2024-11-17 MED ORDER — IOHEXOL 350 MG/ML SOLN
INTRAVENOUS | Status: DC | PRN
  Administered 2024-11-17: 110 mL

## 2024-11-17 MED ORDER — VERAPAMIL HCL 2.5 MG/ML IV SOLN
INTRAVENOUS | Status: DC | PRN
  Administered 2024-11-17: 10 mL via INTRA_ARTERIAL

## 2024-11-17 MED ORDER — FREE WATER
500.0000 mL | Freq: Once | Status: AC
  Administered 2024-11-17: 500 mL via ORAL

## 2024-11-17 MED ORDER — HEPARIN SODIUM (PORCINE) 1000 UNIT/ML IJ SOLN
INTRAMUSCULAR | Status: DC | PRN
  Administered 2024-11-17 (×2): 5000 [IU] via INTRAVENOUS

## 2024-11-17 MED ORDER — SODIUM CHLORIDE 0.9 % IV SOLN: 250.0000 mL | INTRAVENOUS | Status: DC | PRN

## 2024-11-17 MED ORDER — LIDOCAINE HCL (PF) 1 % IJ SOLN
INTRAMUSCULAR | Status: AC
  Filled 2024-11-17: qty 30

## 2024-11-17 MED ORDER — SODIUM CHLORIDE 0.9% FLUSH
3.0000 mL | Freq: Two times a day (BID) | INTRAVENOUS | Status: DC
  Administered 2024-11-18: 3 mL via INTRAVENOUS

## 2024-11-17 MED ORDER — MIDAZOLAM HCL 2 MG/2ML IJ SOLN
INTRAMUSCULAR | Status: AC
  Filled 2024-11-17: qty 2

## 2024-11-17 MED ORDER — FENTANYL CITRATE (PF) 100 MCG/2ML IJ SOLN
INTRAMUSCULAR | Status: DC | PRN
  Administered 2024-11-17 (×2): 50 ug via INTRAVENOUS

## 2024-11-17 MED ORDER — POLYETHYLENE GLYCOL 3350 17 G PO PACK
17.0000 g | PACK | Freq: Every day | ORAL | Status: DC
  Filled 2024-11-17 (×2): qty 1

## 2024-11-17 MED ORDER — MIDAZOLAM HCL (PF) 2 MG/2ML IJ SOLN
INTRAMUSCULAR | Status: DC | PRN
  Administered 2024-11-17: 2 mg via INTRAVENOUS

## 2024-11-17 MED ORDER — LIDOCAINE HCL (PF) 1 % IJ SOLN
INTRAMUSCULAR | Status: DC | PRN
  Administered 2024-11-17: 2 mL

## 2024-11-17 MED ORDER — ASPIRIN 81 MG PO CHEW
81.0000 mg | CHEWABLE_TABLET | ORAL | Status: AC
  Administered 2024-11-17: 81 mg via ORAL

## 2024-11-17 MED ORDER — FENTANYL CITRATE (PF) 100 MCG/2ML IJ SOLN
INTRAMUSCULAR | Status: AC
  Filled 2024-11-17: qty 2

## 2024-11-17 MED ORDER — SODIUM CHLORIDE 0.9% FLUSH: 3.0000 mL | INTRAVENOUS | Status: DC | PRN

## 2024-11-17 MED ORDER — CHLORHEXIDINE GLUCONATE CLOTH 2 % EX PADS
6.0000 | MEDICATED_PAD | Freq: Every day | CUTANEOUS | Status: DC
  Administered 2024-11-17: 6 via TOPICAL

## 2024-11-17 MED ORDER — NITROGLYCERIN 1 MG/10 ML FOR IR/CATH LAB
INTRA_ARTERIAL | Status: AC
  Filled 2024-11-17: qty 10

## 2024-11-17 MED ORDER — HEPARIN (PORCINE) IN NACL 1000-0.9 UT/500ML-% IV SOLN
INTRAVENOUS | Status: DC | PRN
  Administered 2024-11-17 (×2): 500 mL

## 2024-11-17 MED ORDER — LABETALOL HCL 5 MG/ML IV SOLN: 10.0000 mg | INTRAVENOUS | Status: AC | PRN

## 2024-11-17 NOTE — H&P (View-Only) (Signed)
 "  Rounding Note   Patient Name: Renee Bryant Date of Encounter: 11/17/2024  [facility] Cardiologist: Darryle ONEIDA Decent, MD   Subjective  Pt found sleeping, reports ongoing chest pain but was able to sleep last night.  Scheduled Meds:  aspirin   81 mg Oral Daily   aspirin   81 mg Oral Pre-Cath   atorvastatin   80 mg Oral QHS   bictegravir-emtricitabine -tenofovir  AF  1 tablet Oral QHS   carvedilol   3.125 mg Oral BID WC   Chlorhexidine  Gluconate Cloth  6 each Topical Daily   clonazePAM   1 mg Oral BID   clopidogrel   75 mg Oral Daily   famotidine   20 mg Oral BID AC   insulin  aspart  0-9 Units Subcutaneous TID WC   insulin  glargine  40 Units Subcutaneous QHS   ranolazine   1,000 mg Oral BID   tirzepatide   10 mg Subcutaneous Weekly   Continuous Infusions:  heparin  1,400 Units/hr (11/16/24 1738)   nitroGLYCERIN  25 mcg/min (11/16/24 0128)   PRN Meds: acetaminophen  **OR** [DISCONTINUED] acetaminophen , albuterol , diphenhydrAMINE , hydrALAZINE , lip balm, melatonin, morphine  injection, ondansetron  **OR** ondansetron  (ZOFRAN ) IV   Vital Signs  Vitals:   11/16/24 1615 11/16/24 1728 11/16/24 2032 11/17/24 0524  BP: 126/79 113/68 116/79 112/74  Pulse: 81 87 73 74  Resp: 18  18 16   Temp: 98 F (36.7 C)  98.3 F (36.8 C) 98.4 F (36.9 C)  TempSrc: Oral  Oral Oral  SpO2:   97% 93%  Weight:      Height:        Intake/Output Summary (Last 24 hours) at 11/17/2024 0734 Last data filed at 11/17/2024 0700 Gross per 24 hour  Intake 1396.7 ml  Output --  Net 1396.7 ml      11/14/2024    3:23 AM 11/13/2024    8:08 AM 10/05/2024    2:45 PM  Last 3 Weights  Weight (lbs) 199 lb 15.3 oz 200 lb 210 lb 3.2 oz  Weight (kg) 90.7 kg 90.719 kg 95.346 kg      Telemetry SR with HR 80s - Personally Reviewed  ECG  No new tracings - Personally Reviewed  Physical Exam  GEN: No acute distress.   Neck: No JVD Cardiac: RRR, no murmurs, rubs, or gallops.  Respiratory: Clear to  auscultation bilaterally. GI: Soft, nontender, non-distended  MS: No edema; No deformity. Neuro:  Nonfocal  Psych: Normal affect   Labs High Sensitivity Troponin:  No results for input(s): TROPONINIHS in the last 720 hours.  Recent Labs  Lab 11/13/24 0806 11/13/24 1045 11/14/24 0334 11/14/24 0532  TRNPT 20* 16 21* 24*       Chemistry Recent Labs  Lab 11/13/24 0806 11/14/24 0334 11/15/24 0534 11/16/24 0551 11/17/24 0512  NA 135   < > 132* 134* 135  K 4.5   < > 4.5 4.2 4.4  CL 106   < > 104 104 106  CO2 21*   < > 21* 23 21*  GLUCOSE 291*   < > 165* 121* 112*  BUN 30*   < > 29* 33* 32*  CREATININE 1.90*   < > 1.83* 2.13* 2.04*  CALCIUM  7.9*   < > 7.9* 8.1* 8.1*  PROT 5.3*  --   --   --   --   ALBUMIN 3.1*  --   --   --   --   AST 29  --   --   --   --   ALT 26  --   --   --   --  ALKPHOS 47  --   --   --   --   BILITOT 0.2  --   --   --   --   GFRNONAA 33*   < > 35* 29* 31*  ANIONGAP 9   < > 7 7 8    < > = values in this interval not displayed.    Lipids No results for input(s): CHOL, TRIG, HDL, LABVLDL, LDLCALC, CHOLHDL in the last 168 hours.  Hematology Recent Labs  Lab 11/15/24 0534 11/16/24 0551 11/17/24 0512  WBC 8.1 5.2 6.3  RBC 2.93* 2.86* 2.80*  HGB 10.1* 9.9* 9.5*  HCT 28.9* 28.2* 27.3*  MCV 98.6 98.6 97.5  MCH 34.5* 34.6* 33.9  MCHC 34.9 35.1 34.8  RDW 12.3 12.5 12.3  PLT 181 187 194   Thyroid  No results for input(s): TSH, FREET4 in the last 168 hours.  BNP Recent Labs  Lab 11/13/24 1045  PROBNP 62.2    DDimer No results for input(s): DDIMER in the last 168 hours.   Radiology  ECHOCARDIOGRAM COMPLETE Result Date: 11/15/2024    ECHOCARDIOGRAM REPORT   Patient Name:   Renee Bryant Date of Exam: 11/15/2024 Medical Rec #:  [id number]       Height:       67.0 in Accession #:    [id number]      Weight:       200.0 lb Date of Birth:  29-Sep-1981       BSA:          2.022 m Patient Age:    43 years        BP:           136/97  mmHg Patient Gender: F               HR:           78 bpm. Exam Location:  Inpatient Procedure: 2D Echo, Cardiac Doppler, Color Doppler and Intracardiac            Opacification Agent (Both Spectral and Color Flow Doppler were            utilized during procedure). Indications:    R07.9* Chest pain, unspecified  History:        Patient has prior history of Echocardiogram examinations, most                 recent 03/30/2024. CAD; Risk Factors:Hypertension, Diabetes and                 Dyslipidemia.  Sonographer:    Damien Senior RDCS Referring Phys: [id number] AMY N [facility] IMPRESSIONS  1. Left ventricular ejection fraction, by estimation, is 40 to 45%. Left ventricular ejection fraction by 2D MOD biplane is 43.4 %. The left ventricle has mildly decreased function. The left ventricle demonstrates regional wall motion abnormalities (see  scoring diagram/findings for description). There is mild concentric left ventricular hypertrophy. Left ventricular diastolic parameters were normal.  2. Right ventricular systolic function is normal. The right ventricular size is normal. Tricuspid regurgitation signal is inadequate for assessing PA pressure.  3. The mitral valve is normal in structure. Trivial mitral valve regurgitation.  4. The aortic valve has an indeterminant number of cusps. Aortic valve regurgitation is not visualized.  5. The inferior vena cava is normal in size with greater than 50% respiratory variability, suggesting right atrial pressure of 3 mmHg. FINDINGS  Left Ventricle: Left ventricular ejection fraction, by estimation, is 40 to 45%. Left ventricular ejection fraction  by 2D MOD biplane is 43.4 %. The left ventricle has mildly decreased function. The left ventricle demonstrates regional wall motion abnormalities. Definity  contrast agent was given IV to delineate the left ventricular endocardial borders. The left ventricular internal cavity size was normal in size. There is mild concentric left ventricular  hypertrophy. Left ventricular diastolic parameters were normal.  LV Wall Scoring: The apical septal segment, apical anterior segment, apical inferior segment, and apex are akinetic. The mid anteroseptal segment, apical lateral segment, and mid inferoseptal segment are hypokinetic. Right Ventricle: The right ventricular size is normal. No increase in right ventricular wall thickness. Right ventricular systolic function is normal. Tricuspid regurgitation signal is inadequate for assessing PA pressure. Left Atrium: Left atrial size was normal in size. Right Atrium: Right atrial size was normal in size. Pericardium: There is no evidence of pericardial effusion. Mitral Valve: The mitral valve is normal in structure. Trivial mitral valve regurgitation. Tricuspid Valve: The tricuspid valve is normal in structure. Tricuspid valve regurgitation is trivial. Aortic Valve: The aortic valve has an indeterminant number of cusps. Aortic valve regurgitation is not visualized. Pulmonic Valve: The pulmonic valve was normal in structure. Pulmonic valve regurgitation is trivial. Aorta: The aortic root and ascending aorta are structurally normal, with no evidence of dilitation. Venous: The inferior vena cava is normal in size with greater than 50% respiratory variability, suggesting right atrial pressure of 3 mmHg. IAS/Shunts: No atrial level shunt detected by color flow Doppler.  LEFT VENTRICLE PLAX 2D                        Biplane EF (MOD) LVIDd:         4.30 cm         LV Biplane EF:   Left LVIDs:         2.80 cm                          ventricular LV PW:         1.20 cm                          ejection LV IVS:        1.20 cm                          fraction by LVOT diam:     2.30 cm                          2D MOD LV SV:         87                               biplane is LV SV Index:   43                               43.4 %. LVOT Area:     4.15 cm                                Diastology  LV e'  medial:    8.05 cm/s LV Volumes (MOD)               LV E/e' medial:  6.6 LV vol d, MOD    107.0 ml      LV e' lateral:   11.40 cm/s A2C:                           LV E/e' lateral: 4.6 LV vol d, MOD    129.0 ml A4C: LV vol s, MOD    58.9 ml A2C: LV vol s, MOD    72.8 ml A4C: LV SV MOD A2C:   48.1 ml LV SV MOD A4C:   129.0 ml LV SV MOD BP:    51.0 ml RIGHT VENTRICLE RV S prime:     11.60 cm/s  PULMONARY VEINS TAPSE (M-mode): 1.6 cm      Diastolic Velocity: 48.30 cm/s                             S/D Velocity:       1.40                             Systolic Velocity:  65.70 cm/s LEFT ATRIUM             Index        RIGHT ATRIUM           Index LA diam:        3.60 cm 1.78 cm/m   RA Area:     12.20 cm LA Vol (A2C):   45.7 ml 22.60 ml/m  RA Volume:   28.70 ml  14.19 ml/m LA Vol (A4C):   41.4 ml 20.47 ml/m LA Biplane Vol: 46.3 ml 22.90 ml/m  AORTIC VALVE LVOT Vmax:   103.00 cm/s LVOT Vmean:  90.700 cm/s LVOT VTI:    0.209 m  AORTA Ao Root diam: 3.10 cm Ao Asc diam:  2.70 cm MITRAL VALVE MV Area (PHT): 2.77 cm    SHUNTS MV Decel Time: 274 msec    Systemic VTI:  0.21 m MV E velocity: 52.90 cm/s  Systemic Diam: 2.30 cm MV A velocity: 52.40 cm/s MV E/A ratio:  1.01 Kardie Tobb DO Electronically signed by Dub Huntsman DO Signature Date/Time: 11/15/2024/12:56:11 PM    Final     Cardiac Studies  Echo 11/15/24:  1. Left ventricular ejection fraction, by estimation, is 40 to 45%. Left  ventricular ejection fraction by 2D MOD biplane is 43.4 %. The left  ventricle has mildly decreased function. The left ventricle demonstrates  regional wall motion abnormalities (see   scoring diagram/findings for description). There is mild concentric left  ventricular hypertrophy. Left ventricular diastolic parameters were  normal.   2. Right ventricular systolic function is normal. The right ventricular  size is normal. Tricuspid regurgitation signal is inadequate for assessing  PA pressure.   3. The mitral valve is normal in  structure. Trivial mitral valve  regurgitation.   4. The aortic valve has an indeterminant number of cusps. Aortic valve  regurgitation is not visualized.   5. The inferior vena cava is normal in size with greater than 50%  respiratory variability, suggesting right atrial pressure of 3 mmHg.   Patient Profile    43 y.o. female with a hx of CAD with 90% D1 stenosis  treated medically, diabetes type 2, HIV, heart failure with improved ejection fraction, hypertension, COPD who is being seen 11/14/2024 for the evaluation of discomfort    Exercise stress test 05/2024 inconclusive due to sub-maximum exercise, test stopped due to dyspnea.   Assessment & Plan   Elevated troponin  Chest pain - chest pain suspicious for angina  - hx of CAD treated medically - R/L HC deferred yesterday due to bump in creatinine - hs troponin 20 --> 16 --> 21 --> 24 - continue plavix  - hx of chronic angina treated with NTG and ranexa  - continue heparin  gtt and NTG gtt - increased NGT drip yesterday to better control chest pain - entresto  held to improve sCr allowing for LHC today - sCr today 2.04 (2.13) - proceed with R/LHC   CAD - 90% ostial diagonal disease on heart cath 04/2023 treated medically due to location of lesion and high risk of plaque shift/impingement of LAD - started on BB and ranexa  - continued these   Acute on chronic systolic heart failure - echo this admission with LVEF 40-45%, slightly lowe than prior echo - holding entresto  for now, continue 3.125 mg coreg    Acute on chronic renal failure - sCr 2.04 (2.13) - appears near her baseline of 1.8-2.0 - BMP tomorrow morning   Proceed with right and left heart cath today.      For questions or updates, please contact [facility] Please consult [email or web address] for contact info under       Signed, Jon Nat Hails, PA  11/17/2024, 7:34 AM    "

## 2024-11-17 NOTE — Progress Notes (Signed)
 ANTICOAGULATION CONSULT NOTE  Pharmacy Consult for Heparin  Indication: chest pain/ACS  Allergies[1]  Patient Measurements: Height: 5' 7 (170.2 cm) Weight: 90.7 kg (199 lb 15.3 oz) IBW/kg (Calculated) : 61.6 Heparin  Dosing Weight: 81.1 kg  Vital Signs: Temp: 98.4 F (36.9 C) (12/23 0524) Temp Source: Oral (12/23 0524) BP: 112/74 (12/23 0524) Pulse Rate: 74 (12/23 0524)  Labs: Recent Labs    11/15/24 0534 11/15/24 1327 11/15/24 2003 11/16/24 0551 11/17/24 0512  HGB 10.1*  --   --  9.9* 9.5*  HCT 28.9*  --   --  28.2* 27.3*  PLT 181  --   --  187 194  HEPARINUNFRC 0.17*   < > 0.46 0.48 0.36  CREATININE 1.83*  --   --  2.13* 2.04*   < > = values in this interval not displayed.    Estimated Creatinine Clearance: 41.5 mL/min (A) (by C-G formula based on SCr of 2.04 mg/dL (H)).  Medical History: Past Medical History:  Diagnosis Date   Abscess    Asthma    CHF (congestive heart failure) (HCC)    COPD (chronic obstructive pulmonary disease) (HCC)    Coronary artery disease    Diabetes mellitus without complication (HCC)    Emphysema of lung (HCC)    Heart attack (HCC)    HIV (human immunodeficiency virus infection) (HCC)    Hypertension    Pneumonia    Assessment: 77 yof with a history of CAD. Patient is presenting with chest pain. Heparin  per pharmacy consult placed for chest pain/ACS. Patient is not on anticoagulation prior to arrival.  Heparin  level 0.36 is therapeutic with heparin  running at 1400 units/hr. Hgb (9.5) and PLTs (194) are stable. Patient reported some bleeding from IV site last night after her BP was taken. This IV was removed and the site has not been bleeding since then.  Patient to go for heart cath today for ischemic evaluation.  Goal of Therapy:  Heparin  level 0.3-0.7 units/ml Monitor platelets by anticoagulation protocol: Yes   Plan:  Continue heparin  infusion to at 1400 units/hr Check anti-Xa level daily while on heparin  Continue to  monitor H&H and platelets F/u heparin  plans after heart catheterization today  Thank you for allowing pharmacy to be a part of this patients care.   Nidia Schaffer, PharmD PGY2 Cardiology Pharmacy Resident  Please check AMION for all [facility] phone numbers After 10:00 PM, call Main Pharmacy [phone] 11/17/2024 7:03 AM     [1]  Allergies Allergen Reactions   Wellbutrin [Bupropion] Shortness Of Breath    Difficulty breathing   Ozempic (0.25 Or 0.5 Mg-Dose) [Semaglutide(0.25 Or 0.5mg -Dos)] Nausea Only   Jardiance [Empagliflozin] Other (See Comments)    Yeast infections   Metformin And Related Other (See Comments)    Flu-like symptoms, muscle aches, body aches   Pantoprazole  Other (See Comments)    Made the patient feel terrible   Trulicity [Dulaglutide] Rash

## 2024-11-17 NOTE — Progress Notes (Signed)
 "  Rounding Note   Patient Name: Renee Bryant Date of Encounter: 11/17/2024  [facility] Cardiologist: Darryle ONEIDA Decent, MD   Subjective  Pt found sleeping, reports ongoing chest pain but was able to sleep last night.  Scheduled Meds:  aspirin   81 mg Oral Daily   aspirin   81 mg Oral Pre-Cath   atorvastatin   80 mg Oral QHS   bictegravir-emtricitabine -tenofovir  AF  1 tablet Oral QHS   carvedilol   3.125 mg Oral BID WC   Chlorhexidine  Gluconate Cloth  6 each Topical Daily   clonazePAM   1 mg Oral BID   clopidogrel   75 mg Oral Daily   famotidine   20 mg Oral BID AC   insulin  aspart  0-9 Units Subcutaneous TID WC   insulin  glargine  40 Units Subcutaneous QHS   ranolazine   1,000 mg Oral BID   tirzepatide   10 mg Subcutaneous Weekly   Continuous Infusions:  heparin  1,400 Units/hr (11/16/24 1738)   nitroGLYCERIN  25 mcg/min (11/16/24 0128)   PRN Meds: acetaminophen  **OR** [DISCONTINUED] acetaminophen , albuterol , diphenhydrAMINE , hydrALAZINE , lip balm, melatonin, morphine  injection, ondansetron  **OR** ondansetron  (ZOFRAN ) IV   Vital Signs  Vitals:   11/16/24 1615 11/16/24 1728 11/16/24 2032 11/17/24 0524  BP: 126/79 113/68 116/79 112/74  Pulse: 81 87 73 74  Resp: 18  18 16   Temp: 98 F (36.7 C)  98.3 F (36.8 C) 98.4 F (36.9 C)  TempSrc: Oral  Oral Oral  SpO2:   97% 93%  Weight:      Height:        Intake/Output Summary (Last 24 hours) at 11/17/2024 0734 Last data filed at 11/17/2024 0700 Gross per 24 hour  Intake 1396.7 ml  Output --  Net 1396.7 ml      11/14/2024    3:23 AM 11/13/2024    8:08 AM 10/05/2024    2:45 PM  Last 3 Weights  Weight (lbs) 199 lb 15.3 oz 200 lb 210 lb 3.2 oz  Weight (kg) 90.7 kg 90.719 kg 95.346 kg      Telemetry SR with HR 80s - Personally Reviewed  ECG  No new tracings - Personally Reviewed  Physical Exam  GEN: No acute distress.   Neck: No JVD Cardiac: RRR, no murmurs, rubs, or gallops.  Respiratory: Clear to  auscultation bilaterally. GI: Soft, nontender, non-distended  MS: No edema; No deformity. Neuro:  Nonfocal  Psych: Normal affect   Labs High Sensitivity Troponin:  No results for input(s): TROPONINIHS in the last 720 hours.  Recent Labs  Lab 11/13/24 0806 11/13/24 1045 11/14/24 0334 11/14/24 0532  TRNPT 20* 16 21* 24*       Chemistry Recent Labs  Lab 11/13/24 0806 11/14/24 0334 11/15/24 0534 11/16/24 0551 11/17/24 0512  NA 135   < > 132* 134* 135  K 4.5   < > 4.5 4.2 4.4  CL 106   < > 104 104 106  CO2 21*   < > 21* 23 21*  GLUCOSE 291*   < > 165* 121* 112*  BUN 30*   < > 29* 33* 32*  CREATININE 1.90*   < > 1.83* 2.13* 2.04*  CALCIUM  7.9*   < > 7.9* 8.1* 8.1*  PROT 5.3*  --   --   --   --   ALBUMIN 3.1*  --   --   --   --   AST 29  --   --   --   --   ALT 26  --   --   --   --  ALKPHOS 47  --   --   --   --   BILITOT 0.2  --   --   --   --   GFRNONAA 33*   < > 35* 29* 31*  ANIONGAP 9   < > 7 7 8    < > = values in this interval not displayed.    Lipids No results for input(s): CHOL, TRIG, HDL, LABVLDL, LDLCALC, CHOLHDL in the last 168 hours.  Hematology Recent Labs  Lab 11/15/24 0534 11/16/24 0551 11/17/24 0512  WBC 8.1 5.2 6.3  RBC 2.93* 2.86* 2.80*  HGB 10.1* 9.9* 9.5*  HCT 28.9* 28.2* 27.3*  MCV 98.6 98.6 97.5  MCH 34.5* 34.6* 33.9  MCHC 34.9 35.1 34.8  RDW 12.3 12.5 12.3  PLT 181 187 194   Thyroid  No results for input(s): TSH, FREET4 in the last 168 hours.  BNP Recent Labs  Lab 11/13/24 1045  PROBNP 62.2    DDimer No results for input(s): DDIMER in the last 168 hours.   Radiology  ECHOCARDIOGRAM COMPLETE Result Date: 11/15/2024    ECHOCARDIOGRAM REPORT   Patient Name:   Renee Bryant Date of Exam: 11/15/2024 Medical Rec #:  [id number]       Height:       67.0 in Accession #:    [id number]      Weight:       200.0 lb Date of Birth:  29-Sep-1981       BSA:          2.022 m Patient Age:    43 years        BP:           136/97  mmHg Patient Gender: F               HR:           78 bpm. Exam Location:  Inpatient Procedure: 2D Echo, Cardiac Doppler, Color Doppler and Intracardiac            Opacification Agent (Both Spectral and Color Flow Doppler were            utilized during procedure). Indications:    R07.9* Chest pain, unspecified  History:        Patient has prior history of Echocardiogram examinations, most                 recent 03/30/2024. CAD; Risk Factors:Hypertension, Diabetes and                 Dyslipidemia.  Sonographer:    Damien Senior RDCS Referring Phys: [id number] AMY N [facility] IMPRESSIONS  1. Left ventricular ejection fraction, by estimation, is 40 to 45%. Left ventricular ejection fraction by 2D MOD biplane is 43.4 %. The left ventricle has mildly decreased function. The left ventricle demonstrates regional wall motion abnormalities (see  scoring diagram/findings for description). There is mild concentric left ventricular hypertrophy. Left ventricular diastolic parameters were normal.  2. Right ventricular systolic function is normal. The right ventricular size is normal. Tricuspid regurgitation signal is inadequate for assessing PA pressure.  3. The mitral valve is normal in structure. Trivial mitral valve regurgitation.  4. The aortic valve has an indeterminant number of cusps. Aortic valve regurgitation is not visualized.  5. The inferior vena cava is normal in size with greater than 50% respiratory variability, suggesting right atrial pressure of 3 mmHg. FINDINGS  Left Ventricle: Left ventricular ejection fraction, by estimation, is 40 to 45%. Left ventricular ejection fraction  by 2D MOD biplane is 43.4 %. The left ventricle has mildly decreased function. The left ventricle demonstrates regional wall motion abnormalities. Definity  contrast agent was given IV to delineate the left ventricular endocardial borders. The left ventricular internal cavity size was normal in size. There is mild concentric left ventricular  hypertrophy. Left ventricular diastolic parameters were normal.  LV Wall Scoring: The apical septal segment, apical anterior segment, apical inferior segment, and apex are akinetic. The mid anteroseptal segment, apical lateral segment, and mid inferoseptal segment are hypokinetic. Right Ventricle: The right ventricular size is normal. No increase in right ventricular wall thickness. Right ventricular systolic function is normal. Tricuspid regurgitation signal is inadequate for assessing PA pressure. Left Atrium: Left atrial size was normal in size. Right Atrium: Right atrial size was normal in size. Pericardium: There is no evidence of pericardial effusion. Mitral Valve: The mitral valve is normal in structure. Trivial mitral valve regurgitation. Tricuspid Valve: The tricuspid valve is normal in structure. Tricuspid valve regurgitation is trivial. Aortic Valve: The aortic valve has an indeterminant number of cusps. Aortic valve regurgitation is not visualized. Pulmonic Valve: The pulmonic valve was normal in structure. Pulmonic valve regurgitation is trivial. Aorta: The aortic root and ascending aorta are structurally normal, with no evidence of dilitation. Venous: The inferior vena cava is normal in size with greater than 50% respiratory variability, suggesting right atrial pressure of 3 mmHg. IAS/Shunts: No atrial level shunt detected by color flow Doppler.  LEFT VENTRICLE PLAX 2D                        Biplane EF (MOD) LVIDd:         4.30 cm         LV Biplane EF:   Left LVIDs:         2.80 cm                          ventricular LV PW:         1.20 cm                          ejection LV IVS:        1.20 cm                          fraction by LVOT diam:     2.30 cm                          2D MOD LV SV:         87                               biplane is LV SV Index:   43                               43.4 %. LVOT Area:     4.15 cm                                Diastology  LV e'  medial:    8.05 cm/s LV Volumes (MOD)               LV E/e' medial:  6.6 LV vol d, MOD    107.0 ml      LV e' lateral:   11.40 cm/s A2C:                           LV E/e' lateral: 4.6 LV vol d, MOD    129.0 ml A4C: LV vol s, MOD    58.9 ml A2C: LV vol s, MOD    72.8 ml A4C: LV SV MOD A2C:   48.1 ml LV SV MOD A4C:   129.0 ml LV SV MOD BP:    51.0 ml RIGHT VENTRICLE RV S prime:     11.60 cm/s  PULMONARY VEINS TAPSE (M-mode): 1.6 cm      Diastolic Velocity: 48.30 cm/s                             S/D Velocity:       1.40                             Systolic Velocity:  65.70 cm/s LEFT ATRIUM             Index        RIGHT ATRIUM           Index LA diam:        3.60 cm 1.78 cm/m   RA Area:     12.20 cm LA Vol (A2C):   45.7 ml 22.60 ml/m  RA Volume:   28.70 ml  14.19 ml/m LA Vol (A4C):   41.4 ml 20.47 ml/m LA Biplane Vol: 46.3 ml 22.90 ml/m  AORTIC VALVE LVOT Vmax:   103.00 cm/s LVOT Vmean:  90.700 cm/s LVOT VTI:    0.209 m  AORTA Ao Root diam: 3.10 cm Ao Asc diam:  2.70 cm MITRAL VALVE MV Area (PHT): 2.77 cm    SHUNTS MV Decel Time: 274 msec    Systemic VTI:  0.21 m MV E velocity: 52.90 cm/s  Systemic Diam: 2.30 cm MV A velocity: 52.40 cm/s MV E/A ratio:  1.01 Kardie Tobb DO Electronically signed by Dub Huntsman DO Signature Date/Time: 11/15/2024/12:56:11 PM    Final     Cardiac Studies  Echo 11/15/24:  1. Left ventricular ejection fraction, by estimation, is 40 to 45%. Left  ventricular ejection fraction by 2D MOD biplane is 43.4 %. The left  ventricle has mildly decreased function. The left ventricle demonstrates  regional wall motion abnormalities (see   scoring diagram/findings for description). There is mild concentric left  ventricular hypertrophy. Left ventricular diastolic parameters were  normal.   2. Right ventricular systolic function is normal. The right ventricular  size is normal. Tricuspid regurgitation signal is inadequate for assessing  PA pressure.   3. The mitral valve is normal in  structure. Trivial mitral valve  regurgitation.   4. The aortic valve has an indeterminant number of cusps. Aortic valve  regurgitation is not visualized.   5. The inferior vena cava is normal in size with greater than 50%  respiratory variability, suggesting right atrial pressure of 3 mmHg.   Patient Profile    43 y.o. female with a hx of CAD with 90% D1 stenosis  treated medically, diabetes type 2, HIV, heart failure with improved ejection fraction, hypertension, COPD who is being seen 11/14/2024 for the evaluation of discomfort    Exercise stress test 05/2024 inconclusive due to sub-maximum exercise, test stopped due to dyspnea.   Assessment & Plan   Elevated troponin  Chest pain - chest pain suspicious for angina  - hx of CAD treated medically - R/L HC deferred yesterday due to bump in creatinine - hs troponin 20 --> 16 --> 21 --> 24 - continue plavix  - hx of chronic angina treated with NTG and ranexa  - continue heparin  gtt and NTG gtt - increased NGT drip yesterday to better control chest pain - entresto  held to improve sCr allowing for LHC today - sCr today 2.04 (2.13) - proceed with R/LHC   CAD - 90% ostial diagonal disease on heart cath 04/2023 treated medically due to location of lesion and high risk of plaque shift/impingement of LAD - started on BB and ranexa  - continued these   Acute on chronic systolic heart failure - echo this admission with LVEF 40-45%, slightly lowe than prior echo - holding entresto  for now, continue 3.125 mg coreg    Acute on chronic renal failure - sCr 2.04 (2.13) - appears near her baseline of 1.8-2.0 - BMP tomorrow morning   Proceed with right and left heart cath today.      For questions or updates, please contact [facility] Please consult [email or web address] for contact info under       Signed, Jon Nat Hails, PA  11/17/2024, 7:34 AM    "

## 2024-11-17 NOTE — Interval H&P Note (Signed)
 History and Physical Interval Note:  11/17/2024 3:04 PM  Renee Bryant  has presented today for surgery, with the diagnosis of unstable angina.  The various methods of treatment have been discussed with the patient and family. After consideration of risks, benefits and other options for treatment, the patient has consented to  Procedures: LEFT HEART CATH AND CORONARY ANGIOGRAPHY (N/A) as a surgical intervention.  The patient's history has been reviewed, patient examined, no change in status, stable for surgery.  I have reviewed the patient's chart and labs.  Questions were answered to the patient's satisfaction.    Cath Lab Visit (complete for each Cath Lab visit)  Clinical Evaluation Leading to the Procedure:   ACS: No.  Non-ACS:    Anginal Classification: CCS III  Anti-ischemic medical therapy: No Therapy  Non-Invasive Test Results: No non-invasive testing performed  Prior CABG: No previous CABG        Lonni Cash

## 2024-11-17 NOTE — Plan of Care (Signed)
" °  Problem: Health Behavior/Discharge Planning: Goal: Ability to manage health-related needs will improve Outcome: Progressing   Problem: Education: Goal: Knowledge of General Education information will improve Description: Including pain rating scale, medication(s)/side effects and non-pharmacologic comfort measures Outcome: Progressing   Problem: Clinical Measurements: Goal: Will remain free from infection Outcome: Progressing Goal: Respiratory complications will improve Outcome: Progressing   Problem: Activity: Goal: Risk for activity intolerance will decrease Outcome: Progressing   Problem: Pain Managment: Goal: General experience of comfort will improve and/or be controlled Outcome: Progressing   "

## 2024-11-17 NOTE — Plan of Care (Signed)
" °  Problem: Coping: Goal: Ability to adjust to condition or change in health will improve Outcome: Progressing   Problem: Skin Integrity: Goal: Risk for impaired skin integrity will decrease Outcome: Progressing   Problem: Activity: Goal: Risk for activity intolerance will decrease Outcome: Progressing   Problem: Nutrition: Goal: Adequate nutrition will be maintained Outcome: Progressing   Problem: Coping: Goal: Level of anxiety will decrease Outcome: Progressing   Problem: Elimination: Goal: Will not experience complications related to bowel motility Outcome: Progressing Goal: Will not experience complications related to urinary retention Outcome: Progressing   Problem: Pain Managment: Goal: General experience of comfort will improve and/or be controlled Outcome: Progressing   "

## 2024-11-17 NOTE — Progress Notes (Signed)
 "  Renee Bryant  FMW:[id number] DOB: Dec 08, 1980 DOA: 11/14/2024 PCP: Almarie Waddell NOVAK, NP    Brief Narrative:  43 year old with a history of HIV on Biktarvy , insulin -dependent DM, obesity, HLD, HTN, COPD, CKD stage IIIb CAD on DAPT, and chronic angina on Ranexa  who presented to the ER 12/20 with 2 to 3 days of chest pressure.  Initial troponins were 21 and 24.  Goals of Care:   Code Status: Full Code   DVT prophylaxis: Place TED hose Start: 11/14/24 0929   Interim Hx: No acute events recorded overnight.  Afebrile.  Vital signs stable.  Creatinine stable. Was able to have cardiac cath today.   Assessment & Plan:  Unstable angina - CAD with history of PCI Care per Cardiology - cardiac cath 12/23 noted severe mid LAD stenosis treated with 2 stents and a diagonal branch with severe disease that is unchanged from 2024 - on ASA + Plavix  - cont coreg    Newly diagnosed systolic CHF TTE 87/78 noted EF 40-45% with regional LV WMA - likely ischemic in etiology - GDMT per Cardiolgoy   Insulin -dependent DM A1c 7.03 October 2024 - CBG well-controlled  CKD stage IIIb Creatinine stable at baseline of approximately 1.8-2.0 - monitor intermittently - no evidence of AKI at present based on review of baseline creatinines dating back 6 months  HIV infection - asymptomatic  Continue usual Biktarvy  dose  Mixed hyperlipidemia Continue usual atorvastatin  dose  HTN Continue usual home medications   Family Communication:  Disposition: hopeful for d/c home 12/24   Objective: Blood pressure 126/88, pulse 84, temperature 98 F (36.7 C), temperature source Oral, resp. rate 18, height 5' 7 (1.702 m), weight 90.7 kg, last menstrual period 11/01/2024, SpO2 99%.  Intake/Output Summary (Last 24 hours) at 11/17/2024 1755 Last data filed at 11/17/2024 1635 Gross per 24 hour  Intake 1896.7 ml  Output --  Net 1896.7 ml   Filed Weights   11/14/24 0323  Weight: 90.7 kg    Examination: General: No  acute respiratory distress Lungs: Clear to auscultation bilaterally  Cardiovascular: RRR without murmur Abdomen: NT/ND, soft, BS positive, no rebound Extremities: No significant edema bilateral lower extremities  CBC: Recent Labs  Lab 11/13/24 0806 11/14/24 0334 11/15/24 0534 11/16/24 0551 11/17/24 0512  WBC 8.8   < > 8.1 5.2 6.3  NEUTROABS 5.7  --   --   --   --   HGB 10.1*   < > 10.1* 9.9* 9.5*  HCT 29.1*   < > 28.9* 28.2* 27.3*  MCV 97.3   < > 98.6 98.6 97.5  PLT 220   < > 181 187 194   < > = values in this interval not displayed.   Basic Metabolic Panel: Recent Labs  Lab 11/15/24 0534 11/16/24 0551 11/17/24 0512  NA 132* 134* 135  K 4.5 4.2 4.4  CL 104 104 106  CO2 21* 23 21*  GLUCOSE 165* 121* 112*  BUN 29* 33* 32*  CREATININE 1.83* 2.13* 2.04*  CALCIUM  7.9* 8.1* 8.1*   GFR: Estimated Creatinine Clearance: 41.5 mL/min (A) (by C-G formula based on SCr of 2.04 mg/dL (H)).   Scheduled Meds:  aspirin   81 mg Oral Daily   atorvastatin   80 mg Oral QHS   bictegravir-emtricitabine -tenofovir  AF  1 tablet Oral QHS   carvedilol   3.125 mg Oral BID WC   Chlorhexidine  Gluconate Cloth  6 each Topical Daily   clonazePAM   1 mg Oral BID   clopidogrel   75 mg Oral Daily  famotidine   20 mg Oral BID AC   insulin  aspart  0-9 Units Subcutaneous TID WC   insulin  glargine  40 Units Subcutaneous QHS   nitroGLYCERIN        ranolazine   1,000 mg Oral BID   sodium chloride  flush  3 mL Intravenous Q12H   tirzepatide   10 mg Subcutaneous Weekly   Continuous Infusions:  sodium chloride      nitroGLYCERIN  10 mcg/min (11/17/24 1725)     LOS: 2 days   Reyes IVAR Moores, MD [facility] Office  [phone] Pager - Text Page per Tracey  If 7PM-7AM, please contact night-coverage per Amion 11/17/2024, 5:55 PM     "

## 2024-11-18 ENCOUNTER — Other Ambulatory Visit (HOSPITAL_COMMUNITY): Payer: Self-pay

## 2024-11-18 ENCOUNTER — Encounter (HOSPITAL_COMMUNITY): Payer: Self-pay | Admitting: Cardiovascular Disease

## 2024-11-18 DIAGNOSIS — I5023 Acute on chronic systolic (congestive) heart failure: Secondary | ICD-10-CM | POA: Diagnosis not present

## 2024-11-18 DIAGNOSIS — E119 Type 2 diabetes mellitus without complications: Secondary | ICD-10-CM | POA: Diagnosis not present

## 2024-11-18 DIAGNOSIS — Z955 Presence of coronary angioplasty implant and graft: Secondary | ICD-10-CM

## 2024-11-18 DIAGNOSIS — I214 Non-ST elevation (NSTEMI) myocardial infarction: Secondary | ICD-10-CM

## 2024-11-18 DIAGNOSIS — I10 Essential (primary) hypertension: Secondary | ICD-10-CM | POA: Diagnosis not present

## 2024-11-18 DIAGNOSIS — N1832 Chronic kidney disease, stage 3b: Secondary | ICD-10-CM | POA: Diagnosis not present

## 2024-11-18 DIAGNOSIS — I200 Unstable angina: Secondary | ICD-10-CM | POA: Diagnosis not present

## 2024-11-18 DIAGNOSIS — Z794 Long term (current) use of insulin: Secondary | ICD-10-CM | POA: Diagnosis not present

## 2024-11-18 LAB — BASIC METABOLIC PANEL WITH GFR
Anion gap: 8 (ref 5–15)
BUN: 26 mg/dL — ABNORMAL HIGH (ref 6–20)
CO2: 23 mmol/L (ref 22–32)
Calcium: 8.5 mg/dL — ABNORMAL LOW (ref 8.9–10.3)
Chloride: 102 mmol/L (ref 98–111)
Creatinine, Ser: 1.94 mg/dL — ABNORMAL HIGH (ref 0.44–1.00)
GFR, Estimated: 32 mL/min — ABNORMAL LOW
Glucose, Bld: 117 mg/dL — ABNORMAL HIGH (ref 70–99)
Potassium: 4.1 mmol/L (ref 3.5–5.1)
Sodium: 133 mmol/L — ABNORMAL LOW (ref 135–145)

## 2024-11-18 LAB — CBC
HCT: 28.1 % — ABNORMAL LOW (ref 36.0–46.0)
Hemoglobin: 10.1 g/dL — ABNORMAL LOW (ref 12.0–15.0)
MCH: 34.5 pg — ABNORMAL HIGH (ref 26.0–34.0)
MCHC: 35.9 g/dL (ref 30.0–36.0)
MCV: 95.9 fL (ref 80.0–100.0)
Platelets: 218 K/uL (ref 150–400)
RBC: 2.93 MIL/uL — ABNORMAL LOW (ref 3.87–5.11)
RDW: 12.4 % (ref 11.5–15.5)
WBC: 7.5 K/uL (ref 4.0–10.5)
nRBC: 0 % (ref 0.0–0.2)

## 2024-11-18 LAB — GLUCOSE, CAPILLARY: Glucose-Capillary: 100 mg/dL — ABNORMAL HIGH (ref 70–99)

## 2024-11-18 MED ORDER — CLOPIDOGREL BISULFATE 75 MG PO TABS
75.0000 mg | ORAL_TABLET | Freq: Every day | ORAL | 1 refills | Status: AC
  Filled 2024-11-18: qty 90, 90d supply, fill #0

## 2024-11-18 MED ORDER — ASPIRIN 81 MG PO CHEW
81.0000 mg | CHEWABLE_TABLET | Freq: Every day | ORAL | 1 refills | Status: AC
  Filled 2024-11-18: qty 90, 90d supply, fill #0

## 2024-11-18 MED ORDER — EZETIMIBE 10 MG PO TABS
10.0000 mg | ORAL_TABLET | Freq: Every day | ORAL | 1 refills | Status: AC
  Filled 2024-11-18: qty 30, 30d supply, fill #0

## 2024-11-18 MED ORDER — EZETIMIBE 10 MG PO TABS
10.0000 mg | ORAL_TABLET | Freq: Every day | ORAL | Status: DC
  Administered 2024-11-18: 10 mg via ORAL
  Filled 2024-11-18: qty 1

## 2024-11-18 MED ORDER — SPIRONOLACTONE 12.5 MG HALF TABLET: 12.5000 mg | ORAL_TABLET | Freq: Every day | ORAL | Status: DC

## 2024-11-18 MED ORDER — SACUBITRIL-VALSARTAN 24-26 MG PO TABS
1.0000 | ORAL_TABLET | Freq: Two times a day (BID) | ORAL | Status: DC
  Administered 2024-11-18: 1 via ORAL
  Filled 2024-11-18: qty 1

## 2024-11-18 MED FILL — Nitroglycerin IV Soln 100 MCG/ML in D5W: INTRA_ARTERIAL | Qty: 10 | Status: AC

## 2024-11-18 NOTE — Progress Notes (Addendum)
 CARDIAC REHAB PHASE I    Patient seen ambulating in room and getting ready for discharge. No reports of ongoing sob or angina at this time. Post stent education including site care, restrictions, risk factors, exercise guidelines, NTG use, antiplatelet therapy importance, heart healthy diabetic diet, and CRP2 reviewed. All questions and concerns addressed. Patient appears motivated will refer to [facility] for CRP2.    0950-1020 Renee JAYSON Liverpool, RN BSN 11/18/2024 10:20 AM

## 2024-11-18 NOTE — Progress Notes (Signed)
 "  Progress Note  Patient Name: Renee Bryant Date of Encounter: 11/18/2024 [facility] Cardiologist: Darryle ONEIDA Decent, MD   Interval Summary    S/p PTCA/DES x 2 LAD. Labs stable this AM. Cath site looks good. Patient reports minimal chest twinges residual from the procedure and mild SOB, but overall feeling much better. Possible d/c today.  Vital Signs Vitals:   11/17/24 1636 11/17/24 2116 11/17/24 2330 11/18/24 0445  BP: 126/88 122/88 (!) 151/98 (!) 144/96  Pulse: 84 84 86 81  Resp: 18 16 18 19   Temp: 98 F (36.7 C) 98.4 F (36.9 C) 98.4 F (36.9 C) 98.3 F (36.8 C)  TempSrc: Oral Oral Oral Oral  SpO2: 99% 99%  98%  Weight:      Height:        Intake/Output Summary (Last 24 hours) at 11/18/2024 0757 Last data filed at 11/18/2024 0000 Gross per 24 hour  Intake 719.71 ml  Output --  Net 719.71 ml      11/14/2024    3:23 AM 11/13/2024    8:08 AM 10/05/2024    2:45 PM  Last 3 Weights  Weight (lbs) 199 lb 15.3 oz 200 lb 210 lb 3.2 oz  Weight (kg) 90.7 kg 90.719 kg 95.346 kg      Telemetry/ECG  NSR HR 90s - Personally Reviewed  Physical Exam  GEN: No acute distress.   Neck: No JVD Cardiac: RRR, no murmurs, rubs, or gallops.  Respiratory: Clear to auscultation bilaterally. GI: Soft, nontender, non-distended  MS: No edema   Cardiac Studies   LHC 11/17/24    Prox RCA lesion is 20% stenosed.   Mid RCA lesion is 20% stenosed.   Mid Cx lesion is 30% stenosed.   Mid LAD lesion is 95% stenosed.   1st Diag lesion is 90% stenosed.   A drug-eluting stent was successfully placed using a STENT SYNERGY XD 2.75X20.   Post intervention, there is a 0% residual stenosis.   Severe mid LAD stenosis.  Successful PTCA/DES x 2 mid LAD Severe Diagonal 1 stenosis, unchanged since last cath (not felt to be favorable for PCI in 2024).  Mild non-obstructive disease in the Circumflex and RCA   Recommendations: Continue DAPT with ASA and Plavix  for at least six  months.   Echo 11/15/24:  1. Left ventricular ejection fraction, by estimation, is 40 to 45%. Left  ventricular ejection fraction by 2D MOD biplane is 43.4 %. The left  ventricle has mildly decreased function. The left ventricle demonstrates  regional wall motion abnormalities (see   scoring diagram/findings for description). There is mild concentric left  ventricular hypertrophy. Left ventricular diastolic parameters were  normal.   2. Right ventricular systolic function is normal. The right ventricular  size is normal. Tricuspid regurgitation signal is inadequate for assessing  PA pressure.   3. The mitral valve is normal in structure. Trivial mitral valve  regurgitation.   4. The aortic valve has an indeterminant number of cusps. Aortic valve  regurgitation is not visualized.   5. The inferior vena cava is normal in size with greater than 50%  respiratory variability, suggesting right atrial pressure of 3 mmHg.    Patient Profile    43 y.o. female with a hx of CAD with 90% D1 stenosis treated medically, diabetes type 2, HIV, heart failure with improved ejection fraction, hypertension, COPD who is being seen 11/14/2024 for the evaluation of discomfort    Exercise stress test 05/2024 inconclusive due to sub-maximum exercise,  test stopped due to dyspnea.     Assessment & Plan   Elevated troponin Chest pain CAD - presented with chest pain that sounds like angina - HS trop peak 24 - IV heparin  and IV nitroglycerin  - echo showed LVEF 40-45%, mild LVH - LHC showed severe mid LAD stenosis treated with successful PTCA/DES x 2 mid LAD, severe diag 1 stenosis unchanged and not favorable to PCI in 2024, mild nonobstructive circumflex and RCA disease - started on DAPT with ASA and PLAvix  for at least 6 months - IV heparin  and nitroglycerin  held - continue Lipitor  80mg  daily, coreg  3.125mg BID, ranexa  1000mg  BID - ambulate patient this AM. If no angina can likely go  HFmrEF - echo in  2024 with LVEF 45-50%. Echo 03/2024 normal LVEF - echo this admission showed LVEF 40-45% - coreg  3.125mg BID - restart Entresto  24-26mg BID (held for cath) - PTA lasix  20mg  PRN - appears to have intolerance to SGLT2i. Also CKD limiting GDTM - re-check an echo in 2-3 months  HLD - LDL 66 - continue Lipitor  80mg  daily  CKD stage 3 - Scr 1.94   For questions or updates, please contact [facility] Please consult [email or web address] for contact info under         Signed, Dinita Migliaccio VEAR Fishman, PA-C   "

## 2024-11-18 NOTE — Discharge Summary (Signed)
 " Physician Discharge Summary   Patient: Renee Bryant MRN: [id number] DOB: 28-Mar-1981  Admit date:     11/14/2024  Discharge date: {dischdate:26783}  Discharge Physician: Concepcion Riser   PCP: Almarie Waddell NOVAK, NP   Recommendations at discharge:  {Tip this will not be part of the note when signed- Example include specific recommendations for outpatient follow-up, pending tests to follow-up on. (Optional):26781}  ***  Discharge Diagnoses: Principal Problem:   Chest pain Active Problems:   NSTEMI (non-ST elevated myocardial infarction) (HCC)   Cardiac angina   CAD (coronary artery disease)   HIV (human immunodeficiency virus infection) (HCC)   CKD stage 3b, GFR 30-44 ml/min (HCC)   Insulin  dependent type 2 diabetes mellitus (HCC)   Essential hypertension   Mixed hyperlipidemia   Esophageal reflux  Resolved Problems:   * No resolved hospital problems. *  Hospital Course: Ms. Renee Bryant is a 43 year old female with history of HIV compliant with Biktarvy , insulin -dependent diabetes mellitus, obesity, hyperlipidemia, hypertension, history of angina on Ranexa , CAD on dual antiplatelet therapy, COPD, CKD stage IIIb, history of uterine bleeding.  11/14/2024: she presents to the ED for chief concerns of chest pain for 2 to 3 days.  Vitals at the time of my evaluation showed t of 98.4, rr of 17, hr 94, blood pressure 118/80, SpO2 100% on room air.  Serum sodium is 137, potassium 4.3, chloride 105, bicarb 23, BUN of 37, serum creatinine 2.08, eGFR of 30, nonfasting glucose 209, WBC 9.8, hemoglobin 10.6, platelets 233.  High sensitive troponin was 21 and on repeat was 24.  Of note, yesterday patient presented for the same concerns and troponin at that time was 20 and a repeat was 16.  Serum pregnancy was negative.  ED treatment: Aspirin  324 mg p.o. one-time dose, morphine  4 mg IV one-time dose, ondansetron  4 mg IV one-time dose.  12/20: Patient admitted to hospitalist service  for chief concerns of chest pain, NSTEMI.  Assessment and Plan: * Chest pain Differential diagnosis included unstable angina versus new onset heart failure given pain is worse when laying down Doubt dissection as patient appears comfortable laying in ED bed. Left heart catheterization in June 2024 is reviewed Nitroglycerin  gtt. initiated on admission to maintain SBP > 100 mmHg Heparin  GTT per pharmacy initiated Morphine  4 mg IV every 4 hours as needed for severe pain not relieved nitroglycerin  gtt., 2 days ordered Complete echo ordered Update (approximately 14:30): cardiology consultation has been paged, Dr. Sheena is aware.  CAD (coronary artery disease) Patient has been compliant with home aspirin  and Plavix  Atorvastatin  80 mg nightly, Coreg  3.125 mg p.o. twice daily with meals, Ranexa  p.o. twice daily, Entresto  twice daily, Plavix  75 mg daily were resumed on admission  Cardiac angina Patient experiences angina at baseline Ranexa  500 p.o. twice daily resumed  NSTEMI (non-ST elevated myocardial infarction) (HCC) Heparin  GTT, telemetry monitoring  Insulin  dependent type 2 diabetes mellitus (HCC) 10/15/2024: A1c was 7.8% Home long-acting insulin  40 units nightly resumed Insulin  SSI with at bedtime coverage, renal dosing ordered Goal inpatient blood glucose levels 140-180  CKD stage 3b, GFR 30-44 ml/min (HCC) At baseline  HIV (human immunodeficiency virus infection) (HCC) Home Biktarvy  nightly resumed  Esophageal reflux Home famotidine  20 mg p.o. twice daily before meals  Mixed hyperlipidemia Home atorvastatin  80 mg nightly  Essential hypertension Home Entresto  24-26 mg twice daily, Coreg  3.125 mg with meals were resumed Hydralazine  5 mg IV every 6 hours as needed for SBP greater 170, 5 days ordered      {  Tip this will not be part of the note when signed Body mass index is 31.32 kg/m. , ,  (Optional):26781}  {(NOTE) Pain control PDMP Statment  (Optional):26782} Consultants: *** Procedures performed: ***  Disposition: {Plan; Disposition:26390} Diet recommendation:  Discharge Diet Orders (From admission, onward)     Start     Ordered   11/18/24 0000  Diet - low sodium heart healthy        11/18/24 1018   11/18/24 0000  Diet Carb Modified        11/18/24 1018           {Diet_Plan:26776} DISCHARGE MEDICATION: Allergies as of 11/18/2024       Reactions   Wellbutrin [bupropion] Shortness Of Breath   Difficulty breathing   Ozempic (0.25 Or 0.5 Mg-dose) [semaglutide(0.25 Or 0.5mg -dos)] Nausea Only   Jardiance [empagliflozin] Other (See Comments)   Yeast infections   Metformin And Related Other (See Comments)   Flu-like symptoms, muscle aches, body aches   Pantoprazole  Other (See Comments)   Made the patient feel terrible   Trulicity [dulaglutide] Rash        Medication List     STOP taking these medications    famotidine  20 MG tablet Commonly known as: PEPCID        TAKE these medications    albuterol  108 (90 Base) MCG/ACT inhaler Commonly known as: VENTOLIN  HFA Inhale 1-2 puffs into the lungs every 6 (six) hours as needed for wheezing or shortness of breath.   aspirin  81 MG chewable tablet Chew 1 tablet (81 mg total) by mouth daily.   atorvastatin  80 MG tablet Commonly known as: LIPITOR  TAKE 1 TABLET (80 MG TOTAL) BY MOUTH DAILY.   bictegravir-emtricitabine -tenofovir  AF 50-200-25 MG Tabs tablet Commonly known as: BIKTARVY  Take 1 tablet by mouth at bedtime.   carvedilol  3.125 MG tablet Commonly known as: COREG  TAKE 1 TABLET (3.125 MG TOTAL) BY MOUTH 2 (TWO) TIMES DAILY WITH A MEAL.   clonazePAM  1 MG tablet Commonly known as: KLONOPIN  Take 1 mg by mouth in the morning and at bedtime.   clopidogrel  75 MG tablet Commonly known as: PLAVIX  Take 1 tablet (75 mg total) by mouth daily.   Entresto  24-26 MG Generic drug: sacubitril -valsartan  TAKE ONE TABLET BY MOUTH TWICE DAILY   Excedrin   Extra Strength 250-250-65 MG tablet Generic drug: aspirin -acetaminophen -caffeine  Take 1-2 tablets by mouth every 6 (six) hours as needed for headache or migraine.   ezetimibe  10 MG tablet Commonly known as: ZETIA  Take 1 tablet (10 mg total) by mouth daily. Start taking on: November 19, 2024   furosemide  20 MG tablet Commonly known as: LASIX  Take 1 tablet (20 mg total) by mouth daily as needed for edema or fluid.   lansoprazole  30 MG disintegrating tablet Commonly known as: Prevacid  SoluTab Take 1 tablet (30 mg total) by mouth daily at 12 noon. What changed: when to take this   Lantus  SoloStar 100 UNIT/ML Solostar Pen Generic drug: insulin  glargine Inject 40 Units into the skin at bedtime.   Mounjaro  10 MG/0.5ML Pen Generic drug: tirzepatide  Inject 10 mg into the skin once a week.   nitroGLYCERIN  0.4 MG SL tablet Commonly known as: NITROSTAT  Place 1 tablet (0.4 mg total) under the tongue every 5 (five) minutes as needed for chest pain.   ranolazine  500 MG 12 hr tablet Commonly known as: RANEXA  TAKE 1 TABLET (500 MG TOTAL) BY MOUTH 2 (TWO) TIMES DAILY.   valACYclovir 1000 MG tablet Commonly known as: VALTREX Take 1,000 mg  by mouth daily as needed (for flares- AS DIRECTED).   Vitamin D  (Ergocalciferol ) 1.25 MG (50000 UNIT) Caps capsule Commonly known as: DRISDOL  TAKE 1 CAPSULE ONCE WEEKLY What changed: See the new instructions.   Vitamin D3 50 MCG (2000 UT) Tabs Take 2,000 Units by mouth daily.        Discharge Exam: Filed Weights   11/14/24 0323  Weight: 90.7 kg   ***  Condition at discharge: {DC Condition:26389}  The results of significant diagnostics from this hospitalization (including imaging, microbiology, ancillary and laboratory) are listed below for reference.   Imaging Studies: CARDIAC CATHETERIZATION Result Date: 11/18/2024   Prox RCA lesion is 20% stenosed.   Mid RCA lesion is 20% stenosed.   Mid Cx lesion is 30% stenosed.   Mid LAD lesion is  95% stenosed.   1st Diag lesion is 90% stenosed.   A drug-eluting stent was successfully placed using a STENT SYNERGY XD 2.75X20.   Post intervention, there is a 0% residual stenosis. Severe mid LAD stenosis. Successful PTCA/DES x 2 mid LAD Severe Diagonal 1 stenosis, unchanged since last cath (not felt to be favorable for PCI in 2024). Mild non-obstructive disease in the Circumflex and RCA Recommendations: Continue DAPT with ASA and Plavix  for at least six months.   ECHOCARDIOGRAM COMPLETE Result Date: 11/15/2024    ECHOCARDIOGRAM REPORT   Patient Name:   Renee Bryant Date of Exam: 11/15/2024 Medical Rec #:  [id number]       Height:       67.0 in Accession #:    [id number]      Weight:       200.0 lb Date of Birth:  03-11-1981       BSA:          2.022 m Patient Age:    43 years        BP:           136/97 mmHg Patient Gender: F               HR:           78 bpm. Exam Location:  Inpatient Procedure: 2D Echo, Cardiac Doppler, Color Doppler and Intracardiac            Opacification Agent (Both Spectral and Color Flow Doppler were            utilized during procedure). Indications:    R07.9* Chest pain, unspecified  History:        Patient has prior history of Echocardiogram examinations, most                 recent 03/30/2024. CAD; Risk Factors:Hypertension, Diabetes and                 Dyslipidemia.  Sonographer:    Damien Senior RDCS Referring Phys: [id number] AMY N [facility] IMPRESSIONS  1. Left ventricular ejection fraction, by estimation, is 40 to 45%. Left ventricular ejection fraction by 2D MOD biplane is 43.4 %. The left ventricle has mildly decreased function. The left ventricle demonstrates regional wall motion abnormalities (see  scoring diagram/findings for description). There is mild concentric left ventricular hypertrophy. Left ventricular diastolic parameters were normal.  2. Right ventricular systolic function is normal. The right ventricular size is normal. Tricuspid regurgitation signal is inadequate for  assessing PA pressure.  3. The mitral valve is normal in structure. Trivial mitral valve regurgitation.  4. The aortic valve has an indeterminant number of cusps. Aortic valve regurgitation is not  visualized.  5. The inferior vena cava is normal in size with greater than 50% respiratory variability, suggesting right atrial pressure of 3 mmHg. FINDINGS  Left Ventricle: Left ventricular ejection fraction, by estimation, is 40 to 45%. Left ventricular ejection fraction by 2D MOD biplane is 43.4 %. The left ventricle has mildly decreased function. The left ventricle demonstrates regional wall motion abnormalities. Definity  contrast agent was given IV to delineate the left ventricular endocardial borders. The left ventricular internal cavity size was normal in size. There is mild concentric left ventricular hypertrophy. Left ventricular diastolic parameters were normal.  LV Wall Scoring: The apical septal segment, apical anterior segment, apical inferior segment, and apex are akinetic. The mid anteroseptal segment, apical lateral segment, and mid inferoseptal segment are hypokinetic. Right Ventricle: The right ventricular size is normal. No increase in right ventricular wall thickness. Right ventricular systolic function is normal. Tricuspid regurgitation signal is inadequate for assessing PA pressure. Left Atrium: Left atrial size was normal in size. Right Atrium: Right atrial size was normal in size. Pericardium: There is no evidence of pericardial effusion. Mitral Valve: The mitral valve is normal in structure. Trivial mitral valve regurgitation. Tricuspid Valve: The tricuspid valve is normal in structure. Tricuspid valve regurgitation is trivial. Aortic Valve: The aortic valve has an indeterminant number of cusps. Aortic valve regurgitation is not visualized. Pulmonic Valve: The pulmonic valve was normal in structure. Pulmonic valve regurgitation is trivial. Aorta: The aortic root and ascending aorta are structurally  normal, with no evidence of dilitation. Venous: The inferior vena cava is normal in size with greater than 50% respiratory variability, suggesting right atrial pressure of 3 mmHg. IAS/Shunts: No atrial level shunt detected by color flow Doppler.  LEFT VENTRICLE PLAX 2D                        Biplane EF (MOD) LVIDd:         4.30 cm         LV Biplane EF:   Left LVIDs:         2.80 cm                          ventricular LV PW:         1.20 cm                          ejection LV IVS:        1.20 cm                          fraction by LVOT diam:     2.30 cm                          2D MOD LV SV:         87                               biplane is LV SV Index:   43                               43.4 %. LVOT Area:     4.15 cm  Diastology                                LV e' medial:    8.05 cm/s LV Volumes (MOD)               LV E/e' medial:  6.6 LV vol d, MOD    107.0 ml      LV e' lateral:   11.40 cm/s A2C:                           LV E/e' lateral: 4.6 LV vol d, MOD    129.0 ml A4C: LV vol s, MOD    58.9 ml A2C: LV vol s, MOD    72.8 ml A4C: LV SV MOD A2C:   48.1 ml LV SV MOD A4C:   129.0 ml LV SV MOD BP:    51.0 ml RIGHT VENTRICLE RV S prime:     11.60 cm/s  PULMONARY VEINS TAPSE (M-mode): 1.6 cm      Diastolic Velocity: 48.30 cm/s                             S/D Velocity:       1.40                             Systolic Velocity:  65.70 cm/s LEFT ATRIUM             Index        RIGHT ATRIUM           Index LA diam:        3.60 cm 1.78 cm/m   RA Area:     12.20 cm LA Vol (A2C):   45.7 ml 22.60 ml/m  RA Volume:   28.70 ml  14.19 ml/m LA Vol (A4C):   41.4 ml 20.47 ml/m LA Biplane Vol: 46.3 ml 22.90 ml/m  AORTIC VALVE LVOT Vmax:   103.00 cm/s LVOT Vmean:  90.700 cm/s LVOT VTI:    0.209 m  AORTA Ao Root diam: 3.10 cm Ao Asc diam:  2.70 cm MITRAL VALVE MV Area (PHT): 2.77 cm    SHUNTS MV Decel Time: 274 msec    Systemic VTI:  0.21 m MV E velocity: 52.90 cm/s  Systemic Diam: 2.30 cm MV  A velocity: 52.40 cm/s MV E/A ratio:  1.01 Kardie Tobb DO Electronically signed by Dub Huntsman DO Signature Date/Time: 11/15/2024/12:56:11 PM    Final    DG Chest 2 View Result Date: 11/14/2024 EXAM: 2 VIEW(S) XRAY OF THE CHEST 11/14/2024 03:55:00 AM COMPARISON: 11/13/2024. CLINICAL HISTORY: CP FINDINGS: LUNGS AND PLEURA: No focal pulmonary opacity. No pleural effusion. No pneumothorax. HEART AND MEDIASTINUM: No acute abnormality of the cardiac and mediastinal silhouettes. BONES AND SOFT TISSUES: No acute osseous abnormality. IMPRESSION: 1. No acute process. Electronically signed by: Norman Gatlin MD 11/14/2024 04:01 AM EST RP Workstation: [id number]   DG Chest Portable 1 View Result Date: 11/13/2024 EXAM: 1 VIEW(S) XRAY OF THE CHEST 11/13/2024 08:22:00 AM COMPARISON: 05/21/2024 CLINICAL HISTORY: cp FINDINGS: LUNGS AND PLEURA: No focal pulmonary opacity. No pleural effusion. No pneumothorax. HEART AND MEDIASTINUM: No acute abnormality of the cardiac and mediastinal silhouettes. BONES AND SOFT TISSUES: No acute osseous abnormality. IMPRESSION: 1. No acute process. Electronically signed by: Norleen Boxer MD  11/13/2024 09:15 AM EST RP Workstation: [id number]    Microbiology: Results for orders placed or performed during the hospital encounter of 11/13/24  Resp panel by RT-PCR (RSV, Flu A&B, Covid) Anterior Nasal Swab     Status: None   Collection Time: 11/13/24  8:17 AM   Specimen: Anterior Nasal Swab  Result Value Ref Range Status   SARS Coronavirus 2 by RT PCR NEGATIVE NEGATIVE Final    Comment: (NOTE) SARS-CoV-2 target nucleic acids are NOT DETECTED.  The SARS-CoV-2 RNA is generally detectable in upper respiratory specimens during the acute phase of infection. The lowest concentration of SARS-CoV-2 viral copies this assay can detect is 138 copies/mL. A negative result does not preclude SARS-Cov-2 infection and should not be used as the sole basis for treatment or other patient management  decisions. A negative result may occur with  improper specimen collection/handling, submission of specimen other than nasopharyngeal swab, presence of viral mutation(s) within the areas targeted by this assay, and inadequate number of viral copies(<138 copies/mL). A negative result must be combined with clinical observations, patient history, and epidemiological information. The expected result is Negative.  Fact Sheet for Patients:  bloggercourse.com  Fact Sheet for Healthcare Providers:  seriousbroker.it  This test is no t yet approved or cleared by the [place]  FDA and  has been authorized for detection and/or diagnosis of SARS-CoV-2 by FDA under an Emergency Use Authorization (EUA). This EUA will remain  in effect (meaning this test can be used) for the duration of the COVID-19 declaration under Section 564(b)(1) of the Act, 21 U.S.C.section 360bbb-3(b)(1), unless the authorization is terminated  or revoked sooner.       Influenza A by PCR NEGATIVE NEGATIVE Final   Influenza B by PCR NEGATIVE NEGATIVE Final    Comment: (NOTE) The Xpert Xpress SARS-CoV-2/FLU/RSV plus assay is intended as an aid in the diagnosis of influenza from Nasopharyngeal swab specimens and should not be used as a sole basis for treatment. Nasal washings and aspirates are unacceptable for Xpert Xpress SARS-CoV-2/FLU/RSV testing.  Fact Sheet for Patients: bloggercourse.com  Fact Sheet for Healthcare Providers: seriousbroker.it  This test is not yet approved or cleared by the [place]  FDA and has been authorized for detection and/or diagnosis of SARS-CoV-2 by FDA under an Emergency Use Authorization (EUA). This EUA will remain in effect (meaning this test can be used) for the duration of the COVID-19 declaration under Section 564(b)(1) of the Act, 21 U.S.C. section 360bbb-3(b)(1), unless the  authorization is terminated or revoked.     Resp Syncytial Virus by PCR NEGATIVE NEGATIVE Final    Comment: (NOTE) Fact Sheet for Patients: bloggercourse.com  Fact Sheet for Healthcare Providers: seriousbroker.it  This test is not yet approved or cleared by the [place]  FDA and has been authorized for detection and/or diagnosis of SARS-CoV-2 by FDA under an Emergency Use Authorization (EUA). This EUA will remain in effect (meaning this test can be used) for the duration of the COVID-19 declaration under Section 564(b)(1) of the Act, 21 U.S.C. section 360bbb-3(b)(1), unless the authorization is terminated or revoked.  Performed at [facility], [address] [place], [place] [address]     Labs: CBC: Recent Labs  Lab 11/13/24 [phone] 11/14/24 0334 11/15/24 0534 11/16/24 0551 11/17/24 0512 11/18/24 0442  WBC 8.8 9.8 8.1 5.2 6.3 7.5  NEUTROABS 5.7  --   --   --   --   --   HGB 10.1* 10.6* 10.1* 9.9* 9.5* 10.1*  HCT 29.1* 30.8* 28.9* 28.2* 27.3* 28.1*  MCV 97.3 96.9 98.6 98.6 97.5 95.9  PLT 220 233 181 187 194 218   Basic Metabolic Panel: Recent Labs  Lab 11/14/24 0334 11/15/24 0534 11/16/24 0551 11/17/24 0512 11/18/24 0442  NA 137 132* 134* 135 133*  K 4.3 4.5 4.2 4.4 4.1  CL 105 104 104 106 102  CO2 23 21* 23 21* 23  GLUCOSE 209* 165* 121* 112* 117*  BUN 37* 29* 33* 32* 26*  CREATININE 2.08* 1.83* 2.13* 2.04* 1.94*  CALCIUM  8.3* 7.9* 8.1* 8.1* 8.5*   Liver Function Tests: Recent Labs  Lab 11/13/24 0806  AST 29  ALT 26  ALKPHOS 47  BILITOT 0.2  PROT 5.3*  ALBUMIN 3.1*   CBG: Recent Labs  Lab 11/17/24 1343 11/17/24 1453 11/17/24 1636 11/17/24 2114 11/18/24 0805  GLUCAP 102* 94 110* 211* 100*    Discharge time spent: {LESS THAN/GREATER THAN:26388} 30 minutes.  Signed: Concepcion Riser, MD [facility] 11/18/2024 "

## 2024-11-18 NOTE — Plan of Care (Signed)
" °  Problem: Education: Goal: Knowledge of General Education information will improve Description: Including pain rating scale, medication(s)/side effects and non-pharmacologic comfort measures Outcome: Progressing   Problem: Clinical Measurements: Goal: Ability to maintain clinical measurements within normal limits will improve Outcome: Progressing Goal: Will remain free from infection Outcome: Progressing   Problem: Activity: Goal: Risk for activity intolerance will decrease Outcome: Progressing   Problem: Coping: Goal: Level of anxiety will decrease Outcome: Progressing   Problem: Elimination: Goal: Will not experience complications related to bowel motility Outcome: Progressing Goal: Will not experience complications related to urinary retention Outcome: Progressing   Problem: Pain Managment: Goal: General experience of comfort will improve and/or be controlled Outcome: Progressing   "

## 2024-11-18 NOTE — Progress Notes (Signed)
 The patient was getting NTG drip. She refused because of no chest pain per her statement.  NTG stopped and notified Dr. Marty  and he said that's okay. Will continue to monitor.

## 2024-11-19 DIAGNOSIS — Z955 Presence of coronary angioplasty implant and graft: Secondary | ICD-10-CM

## 2024-11-20 ENCOUNTER — Telehealth: Payer: Self-pay

## 2024-11-20 LAB — POCT ACTIVATED CLOTTING TIME: Activated Clotting Time: 281 s

## 2024-11-20 NOTE — Transitions of Care (Post Inpatient/ED Visit) (Signed)
" ° °  11/20/2024  Name: Renee Bryant MRN: [id number] DOB: 06/09/1981  Today's TOC FU Call Status: Today's TOC FU Call Status:: Successful TOC FU Call Completed TOC FU Call Complete Date: 11/20/24 [facility] briefly with patient who stats she has PCP and cardiologist and does not feel she needs to work with nurse and declined [facility] program)  Patient's Name and Date of Birth confirmed. DOB, Name  Transition Care Management Follow-up Telephone Call Date of Discharge: 11/18/24 Discharge Facility: [facility]) Type of Discharge: Inpatient Admission Primary Inpatient Discharge Diagnosis:: chest pain  Shona Prow RN, CCM [place]  VBCI-Population Health RN Care Manager [phone]  "

## 2024-11-23 ENCOUNTER — Ambulatory Visit: Admitting: Neurology

## 2024-11-23 ENCOUNTER — Encounter: Payer: Self-pay | Admitting: Neurology

## 2024-11-23 ENCOUNTER — Telehealth (HOSPITAL_COMMUNITY): Payer: Self-pay

## 2024-11-23 VITALS — BP 114/78 | HR 81 | Ht 67.0 in | Wt 208.4 lb

## 2024-11-23 DIAGNOSIS — R519 Headache, unspecified: Secondary | ICD-10-CM

## 2024-11-23 DIAGNOSIS — G4719 Other hypersomnia: Secondary | ICD-10-CM

## 2024-11-23 DIAGNOSIS — Z955 Presence of coronary angioplasty implant and graft: Secondary | ICD-10-CM | POA: Diagnosis not present

## 2024-11-23 DIAGNOSIS — Z9189 Other specified personal risk factors, not elsewhere classified: Secondary | ICD-10-CM | POA: Diagnosis not present

## 2024-11-23 DIAGNOSIS — R351 Nocturia: Secondary | ICD-10-CM

## 2024-11-23 DIAGNOSIS — R0683 Snoring: Secondary | ICD-10-CM | POA: Diagnosis not present

## 2024-11-23 DIAGNOSIS — I214 Non-ST elevation (NSTEMI) myocardial infarction: Secondary | ICD-10-CM | POA: Diagnosis not present

## 2024-11-23 DIAGNOSIS — R0681 Apnea, not elsewhere classified: Secondary | ICD-10-CM

## 2024-11-23 NOTE — Progress Notes (Signed)
 Subjective:    Patient ID: Renee Bryant is a 43 y.o. female.  HPI    True Mar, MD, PhD [facility] [address], [address] [address] [place], [place] [address]  Renee Bryant,   I saw your patient, Renee Bryant, upon your kind request in my sleep clinic today for initial consultation of her sleep disorder, in particular, concern for underlying obstructive sleep apnea.  The patient is unaccompanied today.  As you know, Renee Bryant is a 43 year old female with an underlying medical history of asthma, congestive heart failure, COPD, coronary artery disease with status post stent placement on 11/17/2024, diabetes, HIV disease, hypertension, history of pneumonia, and obesity, who reports snoring and excessive daytime somnolence as well as witnessed apneas.  Her Epworth sleepiness score is 6 out of 24, fatigue severity score is 51 out of 63.  She had prior sleep testing years ago at [facility].  She has a longstanding history of snoring and difficulty sleeping as well as nonrestorative sleep.  She has not been on CPAP therapy.  She is not aware of any family history of sleep apnea.  She lives with her boyfriend and her 66 year old niece (whom she has guardianship over).  She works as a LAWYER, medical illustrator with typically a split shift from 10 AM to 2 PM and then 8 PM to 10 PM.  She generally goes to bed between 12 and 12:30 AM when she is working and otherwise between 9 and 10 PM.  Rise time is around 8:30 AM.  She has nocturia about 3-4 times per average night and reports frequent morning headaches.  She has had quite a bit of weight fluctuation.  She quit smoking about a year ago.  She smokes marijuana nightly.  She does not drink any alcohol.  She drinks caffeine  in the form of iced coffee, between 16 and 32 ounces per day on average.  She reports a recent toothache in the past few days, she is trying to get an appointment with dentistry but is advised that she may  need to go to the emergency room if she has significant pain, she reports that she is worried about an abscess.  She is on clonazepam  for anxiety and does take it fairly consistently at bedtime.  Her Past Medical History Is Significant For: Past Medical History:  Diagnosis Date   Abscess    Asthma    CHF (congestive heart failure) (HCC)    COPD (chronic obstructive pulmonary disease) (HCC)    Coronary artery disease    Diabetes mellitus without complication (HCC)    Emphysema of lung (HCC)    Heart attack (HCC)    HIV (human immunodeficiency virus infection) (HCC)    Hypertension    Pneumonia     Her Past Surgical History Is Significant For: Past Surgical History:  Procedure Laterality Date   CESAREAN SECTION     CHOLECYSTECTOMY     CORONARY STENT INTERVENTION N/A 11/17/2024   Procedure: CORONARY STENT INTERVENTION;  Surgeon: Verlin Lonni BIRCH, MD;  Location: [facility] INVASIVE CV LAB;  Service: Cardiovascular;  Laterality: N/A;   FOOT SURGERY Left    LEFT HEART CATH AND CORONARY ANGIOGRAPHY N/A 05/03/2023   Procedure: LEFT HEART CATH AND CORONARY ANGIOGRAPHY;  Surgeon: Jordan, Peter M, MD;  [facility] CV LAB;  Service: Cardiovascular;  Laterality: N/A;   LEFT HEART CATH AND CORONARY ANGIOGRAPHY N/A 11/17/2024   Procedure: LEFT HEART CATH AND CORONARY ANGIOGRAPHY;  Surgeon: Verlin Lonni BIRCH,  MD;  Location: [facility] INVASIVE CV LAB;  Service: Cardiovascular;  Laterality: N/A;   TUBAL LIGATION      Her Family History Is Significant For: Family History  Problem Relation Age of Onset   Hypertension Mother    Hypertension Father    Hypertension Maternal Grandfather     Her Social History Is Significant For: Social History   Socioeconomic History   Marital status: Married    Spouse name: Not on file   Number of children: Not on file   Years of education: Not on file   Highest education level: Not on file  Occupational History   Not on file  Tobacco Use    Smoking status: Former    Current packs/day: 1.00    Average packs/day: 1 pack/day for 31.2 years (31.2 ttl pk-yrs)    Types: Cigarettes    Start date: 09/09/1993   Smokeless tobacco: Never   Tobacco comments:    Pt. Reports down to 1/2 a pack a day  Vaping Use   Vaping status: Never Used  Substance and Sexual Activity   Alcohol use: No   Drug use: No   Sexual activity: Yes    Partners: Male    Birth control/protection: Surgical  Other Topics Concern   Not on file  Social History Narrative   1 40oz cup Coffee daily - dinks it throughout the whole day    Social Drivers of Health   Tobacco Use: Medium Risk (11/23/2024)   Patient History    Smoking Tobacco Use: Former    Smokeless Tobacco Use: Never    Passive Exposure: Not on Actuary Strain: Not on file  Food Insecurity: No Food Insecurity (11/14/2024)   Epic    Worried About Programme Researcher, Broadcasting/film/video in the Last Year: Never true    Ran Out of Food in the Last Year: Never true  Transportation Needs: No Transportation Needs (11/14/2024)   Epic    Lack of Transportation (Medical): No    Lack of Transportation (Non-Medical): No  Physical Activity: Not on file  Stress: Not on file  Social Connections: Unknown (04/03/2022)   [facility]   Social Network    Social Network: Not on file  Depression (PHQ2-9): Medium Risk (10/05/2024)   Depression (PHQ2-9)    PHQ-2 Score: 5  Alcohol Screen: Not on file  Housing: Low Risk (11/14/2024)   Epic    Unable to Pay for Housing in the Last Year: No    Number of Times Moved in the Last Year: 0    Homeless in the Last Year: No  Utilities: Not At Risk (11/14/2024)   Epic    Threatened with loss of utilities: No  Health Literacy: Not on file    Her Allergies Are:  Allergies[1]:   Her Current Medications Are:  Outpatient Encounter Medications as of 11/23/2024  Medication Sig   albuterol  (VENTOLIN  HFA) 108 (90 Base) MCG/ACT inhaler Inhale 1-2 puffs into  the lungs every 6 (six) hours as needed for wheezing or shortness of breath.   aspirin  81 MG chewable tablet Chew 1 tablet (81 mg total) by mouth daily.   atorvastatin  (LIPITOR ) 80 MG tablet TAKE 1 TABLET (80 MG TOTAL) BY MOUTH DAILY.   bictegravir-emtricitabine -tenofovir  AF (BIKTARVY ) 50-200-25 MG TABS tablet Take 1 tablet by mouth at bedtime.   carvedilol  (COREG ) 3.125 MG tablet TAKE 1 TABLET (3.125 MG TOTAL) BY MOUTH 2 (TWO) TIMES DAILY WITH A MEAL.   Cholecalciferol (VITAMIN D3)  50 MCG (2000 UT) TABS Take 2,000 Units by mouth daily.   clonazePAM  (KLONOPIN ) 1 MG tablet Take 1 mg by mouth in the morning and at bedtime.   clopidogrel  (PLAVIX ) 75 MG tablet Take 1 tablet (75 mg total) by mouth daily.   ENTRESTO  24-26 MG TAKE ONE TABLET BY MOUTH TWICE DAILY   EXCEDRIN  EXTRA STRENGTH 250-250-65 MG tablet Take 1-2 tablets by mouth every 6 (six) hours as needed for headache or migraine.   ezetimibe  (ZETIA ) 10 MG tablet Take 1 tablet (10 mg total) by mouth daily.   furosemide  (LASIX ) 20 MG tablet Take 1 tablet (20 mg total) by mouth daily as needed for edema or fluid.   lansoprazole  (PREVACID  SOLUTAB) 30 MG disintegrating tablet Take 1 tablet (30 mg total) by mouth daily at 12 noon. (Patient taking differently: Take 30 mg by mouth 2 (two) times daily.)   LANTUS  SOLOSTAR 100 UNIT/ML Solostar Pen Inject 40 Units into the skin at bedtime.   nitroGLYCERIN  (NITROSTAT ) 0.4 MG SL tablet Place 1 tablet (0.4 mg total) under the tongue every 5 (five) minutes as needed for chest pain.   ranolazine  (RANEXA ) 500 MG 12 hr tablet TAKE 1 TABLET (500 MG TOTAL) BY MOUTH 2 (TWO) TIMES DAILY.   tirzepatide  (MOUNJARO ) 10 MG/0.5ML Pen Inject 10 mg into the skin once a week.   valACYclovir (VALTREX) 1000 MG tablet Take 1,000 mg by mouth daily as needed (for flares- AS DIRECTED).   Vitamin D , Ergocalciferol , (DRISDOL ) 1.25 MG (50000 UNIT) CAPS capsule TAKE 1 CAPSULE ONCE WEEKLY (Patient taking differently: Take 50,000 Units by  mouth See admin instructions. Take 50,000 units by mouth on Tuesdays and Thursdays)   No facility-administered encounter medications on file as of 11/23/2024.  :   Review of Systems:  Out of a complete 14 point review of systems, all are reviewed and negative with the exception of these symptoms as listed below:          Review of Systems  Objective:  Neurological Exam  Physical Exam Physical Examination:   Vitals:   11/23/24 1504  BP: 114/78  Pulse: 81    General Examination: The patient is a very pleasant 43 y.o. female in no acute distress. She appears chronically ill and deconditioned.   HEENT: Normocephalic, atraumatic, pupils are equal, round and reactive to light, extraocular tracking is good without limitation to gaze excursion or nystagmus noted. No photophobia.  No Corrective eye glasses in place. Hearing is grossly intact.  Face is symmetric with normal facial animation. Speech is clear without dysarthria. There is no hypophonia. There is no lip, neck/head, jaw or voice tremor. Neck is supple with full range of passive and active motion. There are no carotid bruits on auscultation.  Airway/Oropharynx exam reveals: moderate mouth dryness, adequate to marginal dental hygiene, moderate airway crowding secondary to small airway entry, thicker soft palate and tonsillar size of about 1-2+ bilaterally.  Tongue protrudes centrally and palate elevates symmetrically, neck circumference 14 and three-quarter inches, minimal overbite noted.   Chest: Clear to auscultation without wheezing, rhonchi or crackles noted.  Heart: S1+S2+0, regular and normal without murmurs, rubs or gallops noted.   Abdomen: Soft, non-tender and non-distended.  Extremities: There is no pitting edema in the distal lower extremities bilaterally.   Skin: Warm and dry without trophic changes noted.   Musculoskeletal: exam reveals no obvious joint deformities.   Neurologically:  Mental status: The  patient is awake, alert and oriented in all 4 spheres. Her immediate  and remote memory, attention, language skills and fund of knowledge are appropriate. There is no evidence of aphasia, agnosia, apraxia or anomia. Speech is clear with normal prosody and enunciation. Thought process is linear. Mood is normal and affect is normal.  Cranial nerves II - XII are as described above under HEENT exam.  Motor exam: Normal bulk, strength and tone is noted. There is no obvious action or resting tremor.  Fine motor skills and coordination: Intact grossly.  Cerebellar testing: No dysmetria or intention tremor. There is no truncal or gait ataxia.  Sensory exam: intact to light touch in the upper and lower extremities.  Gait, station and balance: She stands easily. No veering to one side is noted. No leaning to one side is noted. Posture is age-appropriate and stance is narrow based. Gait shows normal stride length and normal pace. No problems turning are noted.   Assessment and plan:   In summary, Renee Bryant is a very pleasant 43 y.o.-year old female with an underlying complex medical history of asthma, congestive heart failure, COPD, coronary artery disease with status post stent placement on 11/17/2024, diabetes, HIV disease, hypertension, history of pneumonia, and obesity, whose history and physical exam are concerning for sleep disordered breathing, particularly obstructive sleep apnea (OSA). A laboratory attended sleep study is typically considered gold standard for evaluation of sleep disordered breathing.   I had a long chat with the patient about my findings and the diagnosis of sleep apnea, particularly OSA, its prognosis and treatment options. We talked about medical/conservative treatments, surgical interventions and non-pharmacological approaches for symptom control. I explained, in particular, the risks and ramifications of untreated moderate to severe OSA, especially with respect to developing  cardiovascular disease down the road, including congestive heart failure (CHF), difficult to treat hypertension, cardiac arrhythmias (particularly A-fib), neurovascular complications including TIA, stroke and dementia. Even type 2 diabetes has, in part, been linked to untreated OSA. Symptoms of untreated OSA may include (but may not be limited to) daytime sleepiness, nocturia (i.e. frequent nighttime urination), memory problems, mood irritability and suboptimally controlled or worsening mood disorder such as depression and/or anxiety, lack of energy, lack of motivation, physical discomfort, as well as recurrent headaches, especially morning or nocturnal headaches. We talked about the importance of maintaining a healthy lifestyle and striving for healthy weight.  The importance of complete THC and ongoing nicotine smoking cessation was also addressed.  In addition, we talked about the importance of striving for and maintaining good sleep hygiene. I recommended a sleep study at this time. I outlined the differences between a laboratory attended sleep study which is considered more comprehensive and accurate over the option of a home sleep test (HST); the latter may lead to underestimation of sleep disordered breathing in some instances and does not help with diagnosing upper airway resistance syndrome and is not accurate enough to diagnose primary central sleep apnea typically. I outlined possible surgical and non-surgical treatment options of OSA, including the use of a positive airway pressure (PAP) device (i.e. CPAP, AutoPAP/APAP or BiPAP in certain circumstances), a custom-made dental device (aka oral appliance, which would require a referral to a specialist dentist or orthodontist typically, and is generally speaking not considered for patients with full dentures or edentulous state), upper airway surgical options, such as traditional UPPP (which is not considered a first-line treatment) or the Inspire device  (hypoglossal nerve stimulator, which would involve a referral for consultation with an ENT surgeon, after careful selection, following inclusion criteria - also not  first-line treatment). I explained the PAP treatment option to the patient in detail, as this is generally considered first-line treatment.  The patient indicated that she would be willing to try PAP therapy, if the need arises. I explained the importance of being compliant with PAP treatment, not only for insurance purposes but primarily to improve patient's symptoms symptoms, and for the patient's long term health benefit, including to reduce Her cardiovascular risks longer-term.    We will pick up our discussion about the next steps and treatment options after testing.  We will keep her posted as to the test results by phone call and/or MyChart messaging where possible.  We will plan to follow-up in sleep clinic accordingly as well.  I answered all her questions today and the patient was in agreement.   I encouraged her to call with any interim questions, concerns, problems or updates or email us  through MyChart.  Generally speaking, sleep test authorizations may take up to 2 weeks, sometimes less, sometimes longer, the patient is encouraged to get in touch with us  if they do not hear back from the sleep lab staff directly within the next 2 weeks.  Thank you very much for allowing me to participate in the care of this nice patient. If I can be of any further assistance to you please do not hesitate to call me at [phone].  Sincerely,   True Mar, MD, PhD     [1]  Allergies Allergen Reactions   Wellbutrin [Bupropion] Shortness Of Breath    Difficulty breathing   Ozempic (0.25 Or 0.5 Mg-Dose) [Semaglutide(0.25 Or 0.5mg -Dos)] Nausea Only   Jardiance [Empagliflozin] Other (See Comments)    Yeast infections   Metformin And Related Other (See Comments)    Flu-like symptoms, muscle aches, body aches   Pantoprazole  Other (See  Comments)    Made the patient feel terrible   Trulicity [Dulaglutide] Rash

## 2024-11-23 NOTE — Telephone Encounter (Signed)
 Faxed outside referral for Phase II Cardiac Rehab to [place].

## 2024-11-30 ENCOUNTER — Encounter: Payer: Self-pay | Admitting: Family Medicine

## 2024-11-30 ENCOUNTER — Ambulatory Visit: Admitting: Family Medicine

## 2024-11-30 VITALS — BP 141/69 | HR 82 | Ht 67.0 in | Wt 207.0 lb

## 2024-11-30 DIAGNOSIS — Z09 Encounter for follow-up examination after completed treatment for conditions other than malignant neoplasm: Secondary | ICD-10-CM

## 2024-11-30 DIAGNOSIS — N1832 Chronic kidney disease, stage 3b: Secondary | ICD-10-CM

## 2024-11-30 DIAGNOSIS — K0889 Other specified disorders of teeth and supporting structures: Secondary | ICD-10-CM | POA: Diagnosis not present

## 2024-11-30 DIAGNOSIS — I214 Non-ST elevation (NSTEMI) myocardial infarction: Secondary | ICD-10-CM

## 2024-11-30 DIAGNOSIS — I129 Hypertensive chronic kidney disease with stage 1 through stage 4 chronic kidney disease, or unspecified chronic kidney disease: Secondary | ICD-10-CM | POA: Diagnosis not present

## 2024-11-30 DIAGNOSIS — I10 Essential (primary) hypertension: Secondary | ICD-10-CM

## 2024-11-30 MED ORDER — AMOXICILLIN 500 MG PO CAPS: 500.0000 mg | ORAL_CAPSULE | Freq: Three times a day (TID) | ORAL | 0 refills | Status: AC
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
## 2024-11-30 NOTE — Assessment & Plan Note (Signed)
 Blood pressure management is crucial post-cardiac events. Anxiety and stress may elevate blood pressure. - Monitor blood pressure at home and keep a log. - Continue current antihypertensive regimen. - Keep cardiology appointment next week.

## 2024-11-30 NOTE — Progress Notes (Signed)
 "  Established Patient Office Visit  Subjective:  Patient ID: Renee Bryant, female    DOB: 1981-11-24  Age: 44 y.o. MRN: [id number]  CC:  Chief Complaint  Patient presents with   Hospitalization Follow-up      HPI Renee Bryant is here for hospital follow up. Initially seen in the emergency department on 11/13/2024 for chest pain with labs showing Overall troponin 20 and 16 on recheck. proBNP is normal. Negative for COVID and flu and RSV. No significant leukocytosis anemia or electrolyte abnormality. Creatinine at baseline. EKG showed sinus rhythm. She was discharged. The following day, on 11/14/2024, she returned with the same complaint and she was admitted for evaluation. Her trop then was 24. She was evaluated by Cardiology, who recommended she undergo a left heart cath, which she did 11/17/2024 showing severe mid LAD stenosis, severe diagonal 1 stenosis unchanged. They placed two stents to mid LAD, started DAPT. She has a follow-up with cardiology scheduled for 12/09/23.  Since discharge she has been feeling well. No chest pain or new symptoms. She is trying to take it easy, but states it is hard for her to slow down. Reports right sided upper and lower molars with pain - states they need to be pulled, but she is trying to schedule a dentist appointment. She thought an infection was starting so she took 4 days of leftover Keflex  and that seemed to help somewhat - no swelling, drainage, odor, but she is still having some pain.       Past Medical History:  Diagnosis Date   Abscess    Asthma    CHF (congestive heart failure) (HCC)    COPD (chronic obstructive pulmonary disease) (HCC)    Coronary artery disease    Diabetes mellitus without complication (HCC)    Emphysema of lung (HCC)    Heart attack (HCC)    HIV (human immunodeficiency virus infection) (HCC)    Hypertension    Pneumonia     Past Surgical History:  Procedure Laterality Date   CESAREAN SECTION      CHOLECYSTECTOMY     CORONARY STENT INTERVENTION N/A 11/17/2024   Procedure: CORONARY STENT INTERVENTION;  Surgeon: Verlin Lonni BIRCH, MD;  Location: [facility] INVASIVE CV LAB;  Service: Cardiovascular;  Laterality: N/A;   FOOT SURGERY Left    LEFT HEART CATH AND CORONARY ANGIOGRAPHY N/A 05/03/2023   Procedure: LEFT HEART CATH AND CORONARY ANGIOGRAPHY;  Surgeon: Jordan, Peter M, MD;  Location: [facility] INVASIVE CV LAB;  Service: Cardiovascular;  Laterality: N/A;   LEFT HEART CATH AND CORONARY ANGIOGRAPHY N/A 11/17/2024   Procedure: LEFT HEART CATH AND CORONARY ANGIOGRAPHY;  Surgeon: Verlin Lonni BIRCH, MD;  Location: [facility] INVASIVE CV LAB;  Service: Cardiovascular;  Laterality: N/A;   TUBAL LIGATION      Family History  Problem Relation Age of Onset   Hypertension Mother    Hypertension Father    Hypertension Maternal Grandfather     Social History   Socioeconomic History   Marital status: Married    Spouse name: Not on file   Number of children: Not on file   Years of education: Not on file   Highest education level: Not on file  Occupational History   Not on file  Tobacco Use   Smoking status: Former    Current packs/day: 1.00    Average packs/day: 1 pack/day for 31.2 years (31.2 ttl pk-yrs)    Types: Cigarettes    Start date: 09/09/1993   Smokeless tobacco: Never  Tobacco comments:    Pt. Reports down to 1/2 a pack a day  Vaping Use   Vaping status: Never Used  Substance and Sexual Activity   Alcohol use: No   Drug use: No   Sexual activity: Yes    Partners: Male    Birth control/protection: Surgical  Other Topics Concern   Not on file  Social History Narrative   1 40oz cup Coffee daily - dinks it throughout the whole day    Social Drivers of Health   Tobacco Use: Medium Risk (11/30/2024)   Patient History    Smoking Tobacco Use: Former    Smokeless Tobacco Use: Never    Passive Exposure: Not on Actuary Strain: Not on file  Food Insecurity: No Food  Insecurity (11/14/2024)   Epic    Worried About Programme Researcher, Broadcasting/film/video in the Last Year: Never true    Ran Out of Food in the Last Year: Never true  Transportation Needs: No Transportation Needs (11/14/2024)   Epic    Lack of Transportation (Medical): No    Lack of Transportation (Non-Medical): No  Physical Activity: Not on file  Stress: Not on file  Social Connections: Unknown (04/03/2022)   Received from [facility]   Social Network    Social Network: Not on file  Intimate Partner Violence: Not At Risk (11/14/2024)   Epic    Fear of Current or Ex-Partner: No    Emotionally Abused: No    Physically Abused: No    Sexually Abused: No  Depression (PHQ2-9): Low Risk (11/30/2024)   Depression (PHQ2-9)    PHQ-2 Score: 4  Recent Concern: Depression (PHQ2-9) - Medium Risk (10/05/2024)   Depression (PHQ2-9)    PHQ-2 Score: 5  Alcohol Screen: Not on file  Housing: Low Risk (11/14/2024)   Epic    Unable to Pay for Housing in the Last Year: No    Number of Times Moved in the Last Year: 0    Homeless in the Last Year: No  Utilities: Not At Risk (11/14/2024)   Epic    Threatened with loss of utilities: No  Health Literacy: Not on file    ROS All ROS negative except what is listed in the HPI.   Objective:   Today's Vitals: BP (!) 141/69   Pulse 82   Ht 5' 7 (1.702 m)   Wt 207 lb (93.9 kg)   LMP 11/01/2024 (Approximate)   SpO2 100%   BMI 32.42 kg/m   Physical Exam Vitals reviewed.  Constitutional:      Appearance: Normal appearance.  HENT:     Nose:     Right Sinus: Maxillary sinus tenderness present.     Mouth/Throat:     Dentition: Abnormal dentition. Dental tenderness present. No dental abscesses.  Cardiovascular:     Rate and Rhythm: Normal rate and regular rhythm.     Heart sounds: Normal heart sounds.  Pulmonary:     Effort: Pulmonary effort is normal.     Breath sounds: Normal breath sounds.  Skin:    General: Skin is warm and dry.  Neurological:     Mental  Status: She is alert and oriented to person, place, and time.  Psychiatric:        Mood and Affect: Mood normal.        Behavior: Behavior normal.        Thought Content: Thought content normal.        Judgment: Judgment normal.  Estimated Creatinine Clearance: 44.4 mL/min (A) (by C-G formula based on SCr of 1.94 mg/dL (H)).    Assessment & Plan:   Problem List Items Addressed This Visit       Active Problems   Essential hypertension (Chronic)   Blood pressure management is crucial post-cardiac events. Anxiety and stress may elevate blood pressure. - Monitor blood pressure at home and keep a log. - Continue current antihypertensive regimen. - Keep cardiology appointment next week.       NSTEMI (non-ST elevated myocardial infarction) (HCC) - Primary   Recent hospitalization for severe chest pain requiring stent placement - Continue current medications - Follow up with cardiology next week. - Monitor blood pressure at home and keep a log.      Relevant Orders   Basic metabolic panel with GFR   CKD stage 3b, GFR 30-44 ml/min (HCC)   Sodium levels low post-hospitalization, kidney function returning to baseline. - Rechecked electrolytes and kidney function today. - Follow up with nephrology as scheduled       Other Visit Diagnoses       Pain, dental     Pain radiating to sinuses and ear. Previous Keflex  use. No significant redness or swelling. - Prescribed amoxicillin  if needed before dental appointment. - Advised salt water  rinses. - Schedule dental appointment for evaluation and treatment.   Relevant Medications   amoxicillin  (AMOXIL ) 500 MG capsule     Hospital discharge follow-up     Feeling much better. See plans above. Keep all upcoming specialist appointments.         Follow-up: Return for keep next routine appointment as scheduled or return sooner if needed .   Waddell FURY Almarie, DNP, FNP-C  I,Emily Lagle,acting as a neurosurgeon for Waddell KATHEE Almarie,  NP.,have documented all relevant documentation on the behalf of Waddell KATHEE Almarie, NP.  I, Waddell KATHEE Almarie, NP, have reviewed all documentation for this visit. The documentation on 11/30/2024 for the exam, diagnosis, procedures, and orders are all accurate and complete. "

## 2024-11-30 NOTE — Assessment & Plan Note (Signed)
 Recent hospitalization for severe chest pain requiring stent placement - Continue current medications - Follow up with cardiology next week. - Monitor blood pressure at home and keep a log.

## 2024-11-30 NOTE — Assessment & Plan Note (Signed)
 Sodium levels low post-hospitalization, kidney function returning to baseline. - Rechecked electrolytes and kidney function today. - Follow up with nephrology as scheduled

## 2024-12-01 ENCOUNTER — Ambulatory Visit: Payer: Self-pay | Admitting: Family Medicine

## 2024-12-01 LAB — BASIC METABOLIC PANEL WITH GFR
BUN: 26 mg/dL — ABNORMAL HIGH (ref 6–23)
CO2: 25 meq/L (ref 19–32)
Calcium: 8.7 mg/dL (ref 8.4–10.5)
Chloride: 103 meq/L (ref 96–112)
Creatinine, Ser: 1.86 mg/dL — ABNORMAL HIGH (ref 0.40–1.20)
GFR: 33.01 mL/min — ABNORMAL LOW
Glucose, Bld: 154 mg/dL — ABNORMAL HIGH (ref 70–99)
Potassium: 4.6 meq/L (ref 3.5–5.1)
Sodium: 135 meq/L (ref 135–145)

## 2024-12-03 NOTE — Progress Notes (Unsigned)
" °  Cardiology Office Note   Date:  12/03/2024  ID:  Renee Bryant, DOB Jul 01, 1981, MRN [id number] PCP: Almarie Waddell NOVAK, NP  [facility] Providers Cardiologist:  Darryle ONEIDA Decent, MD { Click to update primary MD,subspecialty MD or APP then REFRESH:1}    History of Present Illness Renee Bryant is a 44 y.o. female with history of CAD s/p NSTEMI, hypertension, hyperlipidemia, HIV, CKD stage 3B, and T2DM.     She was first evaluated by Dr. Decent on 05/02/2023 in the setting of NSTEMI. She underwent LHC 05/03/2023 with results; proximal to mid LAD 40% stenosis, diagonal 1 90% stenosis, EF 45-50%, and normal LVEDP. Recommended aggressive medical therapy.   She was last seen in office 03/13/24 by Dr. Decent and noted some intermittent dyspnea and occasional angina. Repeat echo shows LVEF 55-60%, no RWMA, RV normal, and no valve abnormalities noted.   She presented to the hospital 11/13/24 with chest pain that woke her from her sleep that improved after nitroglycerin  x 5. EKG reassuring. Negative troponin's. proBNP normal. She was discharged home 12/19.  She returned to the hospital 11/14/24 with intermittent chest discomfort and reported taking 8 nitroglycerin  tablets in 24 hour period. She had intermittent persistent mid sternal left sided pressure. Heparin  and nitroglycerin  gtt started.  Echo 11/15/24 40-45%,, mild LVH, RV normal, trivial MR, and mid anteroseptal, apical lateral, and mid inferoseptal segment hypokinetic.  Cardiac catheterization 11/17/24 shows severe mid LAD stenosis with successful DES (2.75 x 20 mm and 2.75 x 8 mm Synergy). Severe 90% diagonal 1 stenosis that is unchanged from 2024, and mild non obstructive disease in Lcx and RCA. She was d/c'd home 11/18/24 on aspirin  and Plavix . Entresto  restarted.     She presents today for hospital follow up in the setting of NSTEMI.   ROS: All systems negative unless otherwise indicated in HPI.   Studies Reviewed      *** Risk  Assessment/Calculations {Does this patient have ATRIAL FIBRILLATION?:[phone]} No BP recorded.  {Refresh Note OR Click here to enter BP  :1}***       Physical Exam VS:  LMP 11/01/2024 (Approximate)        Wt Readings from Last 3 Encounters:  11/30/24 207 lb (93.9 kg)  11/23/24 208 lb 6.4 oz (94.5 kg)  11/14/24 199 lb 15.3 oz (90.7 kg)    GEN: Well nourished, well developed in no acute distress NECK: No JVD; No carotid bruits CARDIAC: ***RRR, no murmurs, rubs, gallops RESPIRATORY:  Clear to auscultation without rales, wheezing or rhonchi  ABDOMEN: Soft, non-tender, non-distended EXTREMITIES:  No edema; No deformity   ASSESSMENT AND PLAN ***    {Are you ordering a CV Procedure (e.g. stress test, cath, DCCV, TEE, etc)?   Press F2        :[id number]}  Dispo: ***  Signed, Mardy NOVAK Pizza, FNP  "

## 2024-12-04 ENCOUNTER — Telehealth: Payer: Self-pay | Admitting: Neurology

## 2024-12-04 NOTE — Telephone Encounter (Signed)
 NPSG [facility] wellcare pending

## 2024-12-08 ENCOUNTER — Encounter: Payer: Self-pay | Admitting: General Practice

## 2024-12-08 ENCOUNTER — Ambulatory Visit: Payer: Self-pay | Attending: General Practice

## 2024-12-08 VITALS — BP 138/86 | HR 81 | Ht 67.0 in | Wt 202.6 lb

## 2024-12-08 DIAGNOSIS — Z79899 Other long term (current) drug therapy: Secondary | ICD-10-CM | POA: Insufficient documentation

## 2024-12-08 DIAGNOSIS — Z794 Long term (current) use of insulin: Secondary | ICD-10-CM | POA: Insufficient documentation

## 2024-12-08 DIAGNOSIS — R600 Localized edema: Secondary | ICD-10-CM | POA: Diagnosis not present

## 2024-12-08 DIAGNOSIS — I5020 Unspecified systolic (congestive) heart failure: Secondary | ICD-10-CM | POA: Diagnosis not present

## 2024-12-08 DIAGNOSIS — M79661 Pain in right lower leg: Secondary | ICD-10-CM | POA: Insufficient documentation

## 2024-12-08 DIAGNOSIS — E785 Hyperlipidemia, unspecified: Secondary | ICD-10-CM | POA: Insufficient documentation

## 2024-12-08 DIAGNOSIS — M7989 Other specified soft tissue disorders: Secondary | ICD-10-CM | POA: Diagnosis not present

## 2024-12-08 DIAGNOSIS — I2510 Atherosclerotic heart disease of native coronary artery without angina pectoris: Secondary | ICD-10-CM | POA: Insufficient documentation

## 2024-12-08 DIAGNOSIS — E1165 Type 2 diabetes mellitus with hyperglycemia: Secondary | ICD-10-CM | POA: Insufficient documentation

## 2024-12-08 DIAGNOSIS — N1832 Chronic kidney disease, stage 3b: Secondary | ICD-10-CM | POA: Diagnosis not present

## 2024-12-08 MED ORDER — FUROSEMIDE 20 MG PO TABS: ORAL_TABLET | ORAL | 3 refills | Status: AC

## 2024-12-08 MED ORDER — SACUBITRIL-VALSARTAN 49-51 MG PO TABS: 1.0000 | ORAL_TABLET | Freq: Two times a day (BID) | ORAL | 5 refills | Status: AC

## 2024-12-08 NOTE — Patient Instructions (Addendum)
 Medication Instructions:  Increase Entresto  49/51 mg twice daily Furosemide  (Lasix ) 20 mg daily for 2 days, then resume 20 mg daily as needed for any swelling or weight gain.   *If you need a refill on your cardiac medications before your next appointment, please call your pharmacy*  Lab Work: D-Dimer today BMET 1 week Testing/Procedures: NONE ordered at this time of appointment   Follow-Up: At [facility], you and your health needs are our priority.  As part of our continuing mission to provide you with exceptional heart care, our providers are all part of one team.  This team includes your primary Cardiologist (physician) and Advanced Practice Providers or APPs (Physician Assistants and Nurse Practitioners) who all work together to provide you with the care you need, when you need it.  Your next appointment:   1 month(s)  Provider:   Josefa Beauvais, NP          We recommend signing up for the patient portal called MyChart.  Sign up information is provided on this After Visit Summary.  MyChart is used to connect with patients for Virtual Visits (Telemedicine).  Patients are able to view lab/test results, encounter notes, upcoming appointments, etc.  Non-urgent messages can be sent to your provider as well.   To learn more about what you can do with MyChart, go to [email or web address].   Other Instructions

## 2024-12-09 ENCOUNTER — Ambulatory Visit: Payer: Self-pay

## 2024-12-09 LAB — D-DIMER, QUANTITATIVE: D-DIMER: 0.43 mg{FEU}/L (ref 0.00–0.49)

## 2024-12-10 NOTE — Telephone Encounter (Signed)
 NPSG [facility] well[facility] shara: [id number] (exp. 12/04/24 to 03/10/25)

## 2024-12-24 NOTE — Telephone Encounter (Signed)
 I spoke with the patient she is scheduled at [facility] for 12/25/24 at 9 pm.  Sent mychart message.  NPSG [facility] well[facility] shara: [id number] (exp. 12/04/24 to 03/10/25)

## 2024-12-25 ENCOUNTER — Encounter

## 2025-01-06 ENCOUNTER — Ambulatory Visit: Admitting: Family Medicine

## 2025-01-21 ENCOUNTER — Ambulatory Visit: Admitting: General Practice

## 2025-03-09 ENCOUNTER — Ambulatory Visit: Admitting: Neurology
